# Patient Record
Sex: Male | Born: 1950
Health system: Southern US, Community
[De-identification: ages and names within clinical notes are randomized; demographics above are authoritative.]

## PROBLEM LIST (undated history)

## (undated) DIAGNOSIS — Z8489 Family history of other specified conditions: Secondary | ICD-10-CM

## (undated) DIAGNOSIS — E119 Type 2 diabetes mellitus without complications: Secondary | ICD-10-CM

## (undated) DIAGNOSIS — I739 Peripheral vascular disease, unspecified: Secondary | ICD-10-CM

## (undated) DIAGNOSIS — Z8719 Personal history of other diseases of the digestive system: Secondary | ICD-10-CM

## (undated) DIAGNOSIS — Z9289 Personal history of other medical treatment: Secondary | ICD-10-CM

## (undated) DIAGNOSIS — I252 Old myocardial infarction: Secondary | ICD-10-CM

## (undated) DIAGNOSIS — I10 Essential (primary) hypertension: Secondary | ICD-10-CM

## (undated) DIAGNOSIS — I779 Disorder of arteries and arterioles, unspecified: Secondary | ICD-10-CM

## (undated) DIAGNOSIS — I2510 Atherosclerotic heart disease of native coronary artery without angina pectoris: Secondary | ICD-10-CM

## (undated) HISTORY — PX: NO PAST SURGERIES: SHX2092

## (undated) HISTORY — DX: Atherosclerotic heart disease of native coronary artery without angina pectoris: I25.10

## (undated) HISTORY — DX: Personal history of other medical treatment: Z92.89

## (undated) HISTORY — DX: Disorder of arteries and arterioles, unspecified: I77.9

## (undated) HISTORY — DX: Peripheral vascular disease, unspecified: I73.9

## (undated) HISTORY — DX: Old myocardial infarction: I25.2

---

## 1998-03-03 ENCOUNTER — Encounter: Payer: Self-pay | Admitting: Emergency Medicine

## 1998-03-03 ENCOUNTER — Emergency Department (HOSPITAL_COMMUNITY): Admission: EM | Admit: 1998-03-03 | Discharge: 1998-03-03 | Payer: Self-pay | Admitting: Emergency Medicine

## 2003-03-27 ENCOUNTER — Encounter: Admission: RE | Admit: 2003-03-27 | Discharge: 2003-03-27 | Payer: Self-pay | Admitting: Cardiology

## 2007-11-07 ENCOUNTER — Ambulatory Visit: Payer: Self-pay | Admitting: *Deleted

## 2008-01-09 ENCOUNTER — Ambulatory Visit: Payer: Self-pay | Admitting: Internal Medicine

## 2008-01-09 LAB — CONVERTED CEMR LAB
ALT: 14 units/L (ref 0–53)
AST: 12 units/L (ref 0–37)
Albumin: 4.5 g/dL (ref 3.5–5.2)
Alkaline Phosphatase: 97 units/L (ref 39–117)
BUN: 14 mg/dL (ref 6–23)
Basophils Absolute: 0 10*3/uL (ref 0.0–0.1)
Basophils Relative: 1 % (ref 0–1)
CO2: 24 meq/L (ref 19–32)
Calcium: 9.7 mg/dL (ref 8.4–10.5)
Chloride: 101 meq/L (ref 96–112)
Cholesterol: 208 mg/dL — ABNORMAL HIGH (ref 0–200)
Creatinine, Ser: 0.8 mg/dL (ref 0.40–1.50)
Eosinophils Absolute: 0.2 10*3/uL (ref 0.0–0.7)
Eosinophils Relative: 4 % (ref 0–5)
Glucose, Bld: 328 mg/dL — ABNORMAL HIGH (ref 70–99)
HCT: 42.6 % (ref 39.0–52.0)
HDL: 39 mg/dL — ABNORMAL LOW (ref >39)
Hemoglobin: 14.7 g/dL (ref 13.0–17.0)
LDL Cholesterol: 99 mg/dL (ref 0–99)
Lymphocytes Relative: 36 % (ref 12–46)
Lymphs Abs: 1.5 10*3/uL (ref 0.7–4.0)
MCHC: 34.5 g/dL (ref 30.0–36.0)
MCV: 91.6 fL (ref 78.0–100.0)
Monocytes Absolute: 0.4 10*3/uL (ref 0.1–1.0)
Monocytes Relative: 9 % (ref 3–12)
Neutro Abs: 2 10*3/uL (ref 1.7–7.7)
Neutrophils Relative %: 50 % (ref 43–77)
Platelets: 316 10*3/uL (ref 150–400)
Potassium: 4.2 meq/L (ref 3.5–5.3)
RBC: 4.65 M/uL (ref 4.22–5.81)
RDW: 12.5 % (ref 11.5–15.5)
Sodium: 137 meq/L (ref 135–145)
Total Bilirubin: 0.6 mg/dL (ref 0.3–1.2)
Total CHOL/HDL Ratio: 5.3
Total Protein: 8.6 g/dL — ABNORMAL HIGH (ref 6.0–8.3)
Triglycerides: 351 mg/dL — ABNORMAL HIGH (ref <150)
VLDL: 70 mg/dL — ABNORMAL HIGH (ref 0–40)
WBC: 4 10*3/uL (ref 4.0–10.5)

## 2008-02-07 ENCOUNTER — Encounter (INDEPENDENT_AMBULATORY_CARE_PROVIDER_SITE_OTHER): Payer: Self-pay | Admitting: Adult Health

## 2008-02-07 ENCOUNTER — Ambulatory Visit: Payer: Self-pay | Admitting: Internal Medicine

## 2008-02-07 LAB — CONVERTED CEMR LAB: Microalb, Ur: 0.95 mg/dL (ref 0.00–1.89)

## 2008-02-12 ENCOUNTER — Emergency Department (HOSPITAL_COMMUNITY): Admission: EM | Admit: 2008-02-12 | Discharge: 2008-02-13 | Payer: Self-pay | Admitting: Emergency Medicine

## 2008-02-21 ENCOUNTER — Ambulatory Visit: Payer: Self-pay | Admitting: Family Medicine

## 2008-04-17 ENCOUNTER — Ambulatory Visit: Payer: Self-pay | Admitting: Internal Medicine

## 2008-04-17 LAB — CONVERTED CEMR LAB
Cholesterol: 166 mg/dL (ref 0–200)
HDL: 42 mg/dL (ref >39)
LDL Cholesterol: 92 mg/dL (ref 0–99)
Total CHOL/HDL Ratio: 4
Triglycerides: 158 mg/dL — ABNORMAL HIGH (ref <150)
VLDL: 32 mg/dL (ref 0–40)

## 2008-05-15 ENCOUNTER — Ambulatory Visit: Payer: Self-pay | Admitting: Internal Medicine

## 2008-08-14 ENCOUNTER — Ambulatory Visit: Payer: Self-pay | Admitting: Internal Medicine

## 2008-12-31 ENCOUNTER — Ambulatory Visit: Payer: Self-pay | Admitting: Internal Medicine

## 2009-03-16 ENCOUNTER — Emergency Department (HOSPITAL_COMMUNITY): Admission: EM | Admit: 2009-03-16 | Discharge: 2009-03-16 | Payer: Self-pay | Admitting: Emergency Medicine

## 2009-04-02 ENCOUNTER — Ambulatory Visit: Payer: Self-pay | Admitting: Internal Medicine

## 2009-10-12 ENCOUNTER — Ambulatory Visit: Payer: Self-pay | Admitting: Internal Medicine

## 2009-10-12 LAB — CONVERTED CEMR LAB
BUN: 13 mg/dL (ref 6–23)
CO2: 28 meq/L (ref 19–32)
Calcium: 9.2 mg/dL (ref 8.4–10.5)
Chloride: 103 meq/L (ref 96–112)
Cholesterol: 218 mg/dL — ABNORMAL HIGH (ref 0–200)
Creatinine, Ser: 0.95 mg/dL (ref 0.40–1.50)
Glucose, Bld: 157 mg/dL — ABNORMAL HIGH (ref 70–99)
HDL: 29 mg/dL — ABNORMAL LOW (ref >39)
Hgb A1c MFr Bld: 9.2 % — ABNORMAL HIGH (ref <5.7)
Potassium: 4.8 meq/L (ref 3.5–5.3)
Sodium: 137 meq/L (ref 135–145)
Total CHOL/HDL Ratio: 7.5
Triglycerides: 551 mg/dL — ABNORMAL HIGH (ref <150)

## 2010-01-12 ENCOUNTER — Ambulatory Visit: Payer: Self-pay | Admitting: Internal Medicine

## 2010-05-11 ENCOUNTER — Encounter (INDEPENDENT_AMBULATORY_CARE_PROVIDER_SITE_OTHER): Payer: Self-pay | Admitting: Family Medicine

## 2010-05-11 LAB — CONVERTED CEMR LAB: Microalb, Ur: 0.96 mg/dL (ref 0.00–1.89)

## 2012-07-13 ENCOUNTER — Inpatient Hospital Stay (HOSPITAL_COMMUNITY)
Admission: EM | Admit: 2012-07-13 | Discharge: 2012-07-15 | DRG: 378 | Disposition: A | Discharge status: Home / Self Care | Payer: MEDICAID | Attending: Internal Medicine | Admitting: Internal Medicine

## 2012-07-13 ENCOUNTER — Encounter (HOSPITAL_COMMUNITY)
Admission: EM | Disposition: A | Discharge status: Home / Self Care | Payer: Self-pay | Source: Home / Self Care | Attending: Internal Medicine

## 2012-07-13 ENCOUNTER — Encounter (HOSPITAL_COMMUNITY): Payer: Self-pay | Admitting: Emergency Medicine

## 2012-07-13 DIAGNOSIS — E118 Type 2 diabetes mellitus with unspecified complications: Secondary | ICD-10-CM

## 2012-07-13 DIAGNOSIS — J309 Allergic rhinitis, unspecified: Secondary | ICD-10-CM | POA: Diagnosis present

## 2012-07-13 DIAGNOSIS — E119 Type 2 diabetes mellitus without complications: Secondary | ICD-10-CM | POA: Diagnosis present

## 2012-07-13 DIAGNOSIS — K625 Hemorrhage of anus and rectum: Secondary | ICD-10-CM | POA: Diagnosis present

## 2012-07-13 DIAGNOSIS — K5731 Diverticulosis of large intestine without perforation or abscess with bleeding: Principal | ICD-10-CM | POA: Diagnosis present

## 2012-07-13 DIAGNOSIS — K2970 Gastritis, unspecified, without bleeding: Secondary | ICD-10-CM | POA: Diagnosis present

## 2012-07-13 DIAGNOSIS — K922 Gastrointestinal hemorrhage, unspecified: Secondary | ICD-10-CM

## 2012-07-13 DIAGNOSIS — I10 Essential (primary) hypertension: Secondary | ICD-10-CM | POA: Diagnosis present

## 2012-07-13 DIAGNOSIS — D62 Acute posthemorrhagic anemia: Secondary | ICD-10-CM | POA: Diagnosis present

## 2012-07-13 DIAGNOSIS — Z79899 Other long term (current) drug therapy: Secondary | ICD-10-CM

## 2012-07-13 DIAGNOSIS — E1165 Type 2 diabetes mellitus with hyperglycemia: Secondary | ICD-10-CM

## 2012-07-13 DIAGNOSIS — K2990 Gastroduodenitis, unspecified, without bleeding: Secondary | ICD-10-CM | POA: Diagnosis present

## 2012-07-13 DIAGNOSIS — E785 Hyperlipidemia, unspecified: Secondary | ICD-10-CM

## 2012-07-13 DIAGNOSIS — IMO0002 Reserved for concepts with insufficient information to code with codable children: Secondary | ICD-10-CM | POA: Diagnosis present

## 2012-07-13 HISTORY — DX: Essential (primary) hypertension: I10

## 2012-07-13 HISTORY — DX: Family history of other specified conditions: Z84.89

## 2012-07-13 HISTORY — DX: Type 2 diabetes mellitus without complications: E11.9

## 2012-07-13 HISTORY — PX: ESOPHAGOGASTRODUODENOSCOPY: SHX5428

## 2012-07-13 LAB — URINALYSIS, ROUTINE W REFLEX MICROSCOPIC
Ketones, ur: NEGATIVE mg/dL
Leukocytes, UA: NEGATIVE
Nitrite: NEGATIVE
Urobilinogen, UA: 0.2 mg/dL (ref 0.0–1.0)
pH: 5 (ref 5.0–8.0)

## 2012-07-13 LAB — CBC
HCT: 28.6 % — ABNORMAL LOW (ref 39.0–52.0)
MCH: 31.7 pg (ref 26.0–34.0)
MCHC: 36 g/dL (ref 30.0–36.0)
MCV: 88 fL (ref 78.0–100.0)
RDW: 12.2 % (ref 11.5–15.5)
WBC: 4.3 10*3/uL (ref 4.0–10.5)

## 2012-07-13 LAB — CBC WITH DIFFERENTIAL/PLATELET
Basophils Absolute: 0 10*3/uL (ref 0.0–0.1)
Eosinophils Relative: 5 % (ref 0–5)
Lymphocytes Relative: 29 % (ref 12–46)
MCV: 88.3 fL (ref 78.0–100.0)
Neutro Abs: 2.2 10*3/uL (ref 1.7–7.7)
Platelets: 354 10*3/uL (ref 150–400)
RDW: 12 % (ref 11.5–15.5)
WBC: 3.7 10*3/uL — ABNORMAL LOW (ref 4.0–10.5)

## 2012-07-13 LAB — COMPREHENSIVE METABOLIC PANEL
AST: 11 U/L (ref 0–37)
Albumin: 3.7 g/dL (ref 3.5–5.2)
Calcium: 9.6 mg/dL (ref 8.4–10.5)
Chloride: 99 mEq/L (ref 96–112)
Creatinine, Ser: 0.77 mg/dL (ref 0.50–1.35)
Total Bilirubin: 0.3 mg/dL (ref 0.3–1.2)
Total Protein: 8.4 g/dL — ABNORMAL HIGH (ref 6.0–8.3)

## 2012-07-13 LAB — VITAMIN B12: Vitamin B-12: 458 pg/mL (ref 211–911)

## 2012-07-13 LAB — IRON AND TIBC
Iron: 74 ug/dL (ref 42–135)
Saturation Ratios: 23 % (ref 20–55)
TIBC: 321 ug/dL (ref 215–435)

## 2012-07-13 LAB — APTT: aPTT: 28 seconds (ref 24–37)

## 2012-07-13 LAB — GLUCOSE, CAPILLARY
Glucose-Capillary: 260 mg/dL — ABNORMAL HIGH (ref 70–99)
Glucose-Capillary: 151 mg/dL — ABNORMAL HIGH (ref 70–99)
Glucose-Capillary: 75 mg/dL (ref 70–99)

## 2012-07-13 LAB — TYPE AND SCREEN
ABO/RH(D): O POS
Antibody Screen: NEGATIVE

## 2012-07-13 LAB — PROTIME-INR
INR: 1.05 (ref 0.00–1.49)
Prothrombin Time: 13.6 seconds (ref 11.6–15.2)

## 2012-07-13 LAB — RETICULOCYTES: Retic Ct Pct: 1.5 % (ref 0.4–3.1)

## 2012-07-13 LAB — URINE MICROSCOPIC-ADD ON

## 2012-07-13 SURGERY — EGD (ESOPHAGOGASTRODUODENOSCOPY)
Anesthesia: Moderate Sedation

## 2012-07-13 MED ORDER — BUTAMBEN-TETRACAINE-BENZOCAINE 2-2-14 % EX AERO
INHALATION_SPRAY | CUTANEOUS | Status: DC | PRN
  Administered 2012-07-13: 2 via TOPICAL

## 2012-07-13 MED ORDER — SODIUM CHLORIDE 0.9 % IV SOLN: INTRAVENOUS | Status: DC

## 2012-07-13 MED ORDER — SIMVASTATIN 40 MG PO TABS
40.0000 mg | ORAL_TABLET | Freq: Every day | ORAL | Status: DC
  Administered 2012-07-13: 40 mg via ORAL
  Filled 2012-07-13 (×2): qty 1

## 2012-07-13 MED ORDER — PANTOPRAZOLE SODIUM 40 MG IV SOLR
40.0000 mg | INTRAVENOUS | Status: DC
  Filled 2012-07-13 (×2): qty 40

## 2012-07-13 MED ORDER — FENTANYL CITRATE 0.05 MG/ML IJ SOLN
INTRAMUSCULAR | Status: DC | PRN
  Administered 2012-07-13 (×2): 25 ug via INTRAVENOUS

## 2012-07-13 MED ORDER — PEG 3350-KCL-NA BICARB-NACL 420 G PO SOLR
4000.0000 mL | Freq: Once | ORAL | Status: AC | PRN
  Filled 2012-07-13: qty 4000

## 2012-07-13 MED ORDER — MIDAZOLAM HCL 10 MG/2ML IJ SOLN
INTRAMUSCULAR | Status: DC | PRN
  Administered 2012-07-13 (×2): 2 mg via INTRAVENOUS
  Administered 2012-07-13: 1 mg via INTRAVENOUS

## 2012-07-13 MED ORDER — FENTANYL CITRATE 0.05 MG/ML IJ SOLN
INTRAMUSCULAR | Status: AC
  Filled 2012-07-13: qty 2

## 2012-07-13 MED ORDER — DIPHENHYDRAMINE HCL 50 MG/ML IJ SOLN
25.0000 mg | Freq: Once | INTRAMUSCULAR | Status: AC
  Administered 2012-07-13: 25 mg via INTRAVENOUS

## 2012-07-13 MED ORDER — INSULIN GLARGINE 100 UNIT/ML ~~LOC~~ SOLN
15.0000 [IU] | Freq: Every day | SUBCUTANEOUS | Status: DC
  Administered 2012-07-14: 15 [IU] via SUBCUTANEOUS
  Filled 2012-07-13 (×3): qty 0.15

## 2012-07-13 MED ORDER — MIDAZOLAM HCL 5 MG/ML IJ SOLN
INTRAMUSCULAR | Status: AC
  Filled 2012-07-13: qty 2

## 2012-07-13 MED ORDER — PEG 3350-KCL-NA BICARB-NACL 420 G PO SOLR
4000.0000 mL | Freq: Once | ORAL | Status: AC
  Administered 2012-07-13: 4000 mL via ORAL
  Filled 2012-07-13 (×2): qty 4000

## 2012-07-13 MED ORDER — FAMOTIDINE IN NACL 20-0.9 MG/50ML-% IV SOLN
20.0000 mg | Freq: Once | INTRAVENOUS | Status: AC
  Administered 2012-07-13: 20 mg via INTRAVENOUS
  Filled 2012-07-13: qty 50

## 2012-07-13 MED ORDER — DIPHENHYDRAMINE HCL 50 MG/ML IJ SOLN
INTRAMUSCULAR | Status: AC
  Filled 2012-07-13: qty 1

## 2012-07-13 MED ORDER — FAMOTIDINE IN NACL 20-0.9 MG/50ML-% IV SOLN
20.0000 mg | INTRAVENOUS | Status: AC
  Administered 2012-07-13: 20 mg via INTRAVENOUS
  Filled 2012-07-13: qty 50

## 2012-07-13 MED ORDER — BISACODYL 5 MG PO TBEC
10.0000 mg | DELAYED_RELEASE_TABLET | Freq: Once | ORAL | Status: AC
  Administered 2012-07-13: 10 mg via ORAL
  Filled 2012-07-13: qty 2

## 2012-07-13 MED ORDER — SODIUM CHLORIDE 0.9 % IJ SOLN
3.0000 mL | Freq: Two times a day (BID) | INTRAMUSCULAR | Status: DC
  Administered 2012-07-13: 3 mL via INTRAVENOUS

## 2012-07-13 MED ORDER — SODIUM CHLORIDE 0.9 % IV SOLN
INTRAVENOUS | Status: DC
  Administered 2012-07-13: 20:00:00 via INTRAVENOUS
  Administered 2012-07-14: 1000 mL via INTRAVENOUS

## 2012-07-13 MED ORDER — PANTOPRAZOLE SODIUM 40 MG IV SOLR
40.0000 mg | Freq: Once | INTRAVENOUS | Status: AC
  Administered 2012-07-13: 40 mg via INTRAVENOUS
  Filled 2012-07-13: qty 40

## 2012-07-13 MED ORDER — AMLODIPINE BESYLATE 5 MG PO TABS
5.0000 mg | ORAL_TABLET | Freq: Every day | ORAL | Status: DC
  Administered 2012-07-13 – 2012-07-15 (×2): 5 mg via ORAL
  Filled 2012-07-13 (×3): qty 1

## 2012-07-13 MED ORDER — INSULIN ASPART 100 UNIT/ML ~~LOC~~ SOLN
0.0000 [IU] | Freq: Three times a day (TID) | SUBCUTANEOUS | Status: DC
  Administered 2012-07-13: 8 [IU] via SUBCUTANEOUS
  Administered 2012-07-14: 5 [IU] via SUBCUTANEOUS
  Administered 2012-07-14: 2 [IU] via SUBCUTANEOUS
  Administered 2012-07-15: 11 [IU] via SUBCUTANEOUS

## 2012-07-13 MED ORDER — METHYLPREDNISOLONE SODIUM SUCC 125 MG IJ SOLR
60.0000 mg | Freq: Once | INTRAMUSCULAR | Status: AC
  Administered 2012-07-13: 60 mg via INTRAVENOUS
  Filled 2012-07-13: qty 0.96

## 2012-07-13 NOTE — Op Note (Signed)
[facility] [address] [place], [address]   ENDOSCOPY PROCEDURE REPORT  PATIENT: Carl, Clark  MR#: [id number] BIRTHDATE: 10/08/1950 , 62  yrs. old GENDER: Male ENDOSCOPIST: Willis Modena, MD REFERRED BY:  [facility] PROCEDURE DATE:  07/13/2012 PROCEDURE:  EGD, diagnostic ASA CLASS:     Class II INDICATIONS:  blood in stool, anemia. MEDICATIONS: Fentanyl-Detailed 50 mcg IV and Versed 5 mg IV TOPICAL ANESTHETIC: Cetacaine Spray  DESCRIPTION OF PROCEDURE: After the risks benefits and alternatives of the procedure were thoroughly explained, informed consent was obtained.  The PENTAX GASTOROSCOPE [id number] endoscope was introduced through the mouth and advanced to the second portion of the duodenum. Without limitations.  The instrument was slowly withdrawn as the mucosa was fully examined.    Findings:  Normal esophagus.  Mild gastritis, otherwise normal stomach and pylorus.  Normal duodenum to the second portion.   No ulcer, mass, AVM or other pathology was seen.  No old or fresh blood was seen to the extent of our examination.         The scope was then withdrawn from the patient and the procedure completed.  ENDOSCOPIC IMPRESSION:     Mild gastritis, otherwise normal endoscopy.  No source of blood in stool identified.  RECOMMENDATIONS:     1.  Watch for potential complications of procedure. 2.  Colonoscopy tomorrow. 3.  Next step in management pending colonoscopy findings. 4.  [facility] inpatient team to follow.  eSigned:  Willis Modena, MD 07/13/2012 2:21 PM   CC:

## 2012-07-13 NOTE — Progress Notes (Signed)
Pt c/o lip swelling, bottom lip swollen. Pt states his whole face swelled up about 10days ago. Stated eye were swollen shut, lips very swollen. Dr. Gonzella Lex made aware. New orders given. Emelda Brothers RN

## 2012-07-13 NOTE — Progress Notes (Signed)
Hypoglycemic Event  CBG: 75  Treatment: 15 GM carbohydrate snack  Symptoms: None  Follow-up CBG: Time:2205 CBG Result:151  Possible Reasons for Event: Inadequate meal intake; pt on clear liquid diet & npo after midnight for procedure in am.  Comments/MD notified: Bretta Bang, MD on call notified. Verbal order to hold night time dose of Lantus. Will continue to monitor the pt.     Eliane Decree R  Remember to initiate Hypoglycemia Order Set & complete

## 2012-07-13 NOTE — Interval H&P Note (Signed)
History and Physical Interval Note:  07/13/2012 1:27 PM  Carl Clark  has presented today for surgery, with the diagnosis of melena  The various methods of treatment have been discussed with the patient and family. After consideration of risks, benefits and other options for treatment, the patient has consented to  Procedure(s) with comments: ESOPHAGOGASTRODUODENOSCOPY (EGD) (N/A) - pt in ED A8 as a surgical intervention .  The patient's history has been reviewed, patient examined, no change in status, stable for surgery.  I have reviewed the patient's chart and labs.  Questions were answered to the patient's satisfaction.     Carl Clark  Assessment:  1.  Blood in stool. 2.  Anemia, likely acute blood loss.  Plan:  1.  Endoscopy. 2.  Phone interpreter (Arabic) used to obtain informed consent for procedure.  Risks (bleeding, infection, bowel perforation that could require surgery, sedation-related changes in cardiopulmonary systems), benefits (identification and possible treatment of source of symptoms, exclusion of certain causes of symptoms), and alternatives (watchful waiting, radiographic imaging studies, empiric medical treatment) of upper endoscopy (EGD) were explained to patient (with use of phone interpreter) in detail and patient wishes to proceed. 3.  If endoscopy is unrevealing, consider colonoscopy.

## 2012-07-13 NOTE — ED Notes (Signed)
Pt primary language is arabic.

## 2012-07-13 NOTE — ED Notes (Signed)
Pt seen by PMD yesterday and told to come to ED for further eval of uncontrolled DM A1c=12% and abdominal pain with rectal bleeding x 3-4 days. Pt reports large amount of blood noted when using the bathroom.

## 2012-07-13 NOTE — ED Notes (Signed)
Pt states that he last took his insulin at 5am today.

## 2012-07-13 NOTE — H&P (Signed)
[facility] History and Physical  Carl Clark WUJ:[id number] DOB: Jun 12, 1950 DOA: 07/13/2012  Referring physician: ED PCP: Hildred Laser, MD   Chief Complaint:  Rectal bleeding x 5 days  HPI:  62 Y/O [name] with hx of uncontrolled DM and HTN was sent by his PCP for symptoms of rectal bleeding for past 5 days. Patient  infomrs having large amount of bright red bleeding of almost 5-6  episodes since past 5 days. denies hematemesis, nausea , vomiting, abdominal pain, chest pain, palpitations, SOB, headache, dizziness, blurry vision, urinary symptoms. Denies similar symptoms in past. Denies use of NSAIDs.  In the ED patient's vitals were stable. Noted for hb of 11 and stool for occult blood was positive. [facility] called [facility]. [facility] consulted and patient taken for EGD.  Review of Systems:  Constitutional: Denies fever, chills, diaphoresis, appetite change and fatigue.  HEENT: Denies photophobia, eye pain, redness, hearing loss, ear pain, congestion, sore throat, rhinorrhea, sneezing, mouth sores, trouble swallowing, neck pain, neck stiffness and tinnitus.   Respiratory: Denies SOB, DOE, cough, chest tightness,  and wheezing.   Cardiovascular: Denies chest pain, palpitations and leg swelling.  Gastrointestinal: painless rectal bleed. Denies nausea, vomiting, abdominal pain, diarrhea, constipation, and abdominal distention.  Genitourinary: Denies dysuria, urgency, frequency, hematuria, flank pain and difficulty urinating.  Musculoskeletal: Denies myalgias, back pain, joint swelling, arthralgias and gait problem.  Skin: Denies pallor, rash and wound.  Neurological: Denies dizziness, seizures, syncope, weakness, light-headedness, numbness and headaches.  Hematological: Denies adenopathy. Easy bruising, personal or family bleeding history  Psychiatric/Behavioral: Denies suicidal ideation, mood changes, confusion, nervousness, sleep disturbance and  agitation   Past Medical History  Diagnosis Date  . Diabetes mellitus without complication   . Hypertension    History reviewed. No pertinent past surgical history. Social History:  reports that he has never smoked. He does not have any smokeless tobacco history on file. He reports that he does not drink alcohol or use illicit drugs.  No Known Allergies  History reviewed. No pertinent family history.  Prior to Admission medications   Medication Sig Start Date End Date Taking? Authorizing Provider  amLODipine (NORVASC) 5 MG tablet Take 5 mg by mouth daily.   Yes Historical Provider, MD  cetirizine (ZYRTEC) 10 MG tablet Take 10 mg by mouth daily as needed for allergies.   Yes Historical Provider, MD  glipiZIDE (GLUCOTROL) 10 MG tablet Take 10 mg by mouth 2 (two) times daily before a meal.   Yes Historical Provider, MD  metFORMIN (GLUCOPHAGE) 1000 MG tablet Take 1,000 mg by mouth 2 (two) times daily with a meal.   Yes Historical Provider, MD  pravastatin (PRAVACHOL) 80 MG tablet Take 80 mg by mouth daily.   Yes Historical Provider, MD    Physical Exam:  Filed Vitals:   07/13/12 1422 07/13/12 1430 07/13/12 1440 07/13/12 1504  BP: 129/71 129/71 131/85 130/61  Pulse: 81   65  Temp: 98.1 F (36.7 C)   97.7 F (36.5 C)  TempSrc: Oral   Oral  Resp: 14 15 13 18   Height:      Weight:      SpO2: 100%   100%    Constitutional: Vital signs reviewed.  Patient is a well-developed and well-nourished in no acute distress and cooperative with exam. Alert and oriented x3.  Head: Normocephalic and atraumatic Mouth: no erythema or exudates, MMM Eyes: PERRL, EOMI, conjunctivae normal, No scleral icterus.  Neck: Supple, Trachea midline normal ROM, No JVD, mass,  thyromegaly, or carotid bruit present.  Cardiovascular: RRR, S1 normal, S2 normal, no MRG, pulses symmetric and intact bilaterally Pulmonary/Chest: CTAB, no wheezes, rales, or rhonchi Abdominal: Soft. Non-tender, non-distended, bowel  sounds are normal, no masses, organomegaly, or guarding present.  GU: no CVA tenderness Musculoskeletal: No joint deformities, erythema, or stiffness, ROM full and no nontender Ext: no edema and no cyanosis, pulses palpable bilaterally Hematology: no cervical, inginal, or axillary adenopathy.  Neurological: A&O x3, normal motor and sensory function Skin: Warm, dry and intact. No rash, cyanosis, or clubbing.  Psychiatric: Normal mood and affect. speech and behavior is normal. Judgment and thought content normal. Cognition and memory are normal.   Labs on Admission:  Basic Metabolic Panel:  Recent Labs Lab 07/13/12 0937  NA 136  K 3.9  CL 99  CO2 28  GLUCOSE 318*  BUN 22  CREATININE 0.77  CALCIUM 9.6   Liver Function Tests:  Recent Labs Lab 07/13/12 0937  AST 11  ALT 8  ALKPHOS 72  BILITOT 0.3  PROT 8.4*  ALBUMIN 3.7   No results found for this basename: LIPASE, AMYLASE,  in the last 168 hours No results found for this basename: AMMONIA,  in the last 168 hours CBC:  Recent Labs Lab 07/13/12 0937  WBC 3.7*  NEUTROABS 2.2  HGB 11.4*  HCT 31.7*  MCV 88.3  PLT 354   Cardiac Enzymes: No results found for this basename: CKTOTAL, CKMB, CKMBINDEX, TROPONINI,  in the last 168 hours BNP: No components found with this basename: POCBNP,  CBG:  Recent Labs Lab 07/13/12 0943  GLUCAP 288*    Radiological Exams on Admission: No results found.    Assessment/Plan  Principal Problem:   Painless Rectal bleeding No clear etiology. [facility] consulted. EGD showing mild gastritis only. patient denies use of NSAIDs or previous EGD/ colonoscopy Admit to telemetry. Clears for now and NPO after midnight for colonoscopy in am. protonix q 24hr Serial H&H q 8hr. No need for transfusion at this time. No baseline hb noted.    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 On lantus 20 units on am, glipizide and metformin. Hold oral meds. lantus 15 units at bedtime and SSI.    Unspecified essential hypertension continue amlod   DVT prophylaxis: SCD  Code Status: full code Family Communication: none at bedside Disposition Plan: home once stable  Eddie [facility] Pager [phone]  If 7PM-7AM, please contact night-coverage [email or web address] Password [facility] 07/13/2012, 3:39 PM     Total time spent: 70 minutes

## 2012-07-13 NOTE — H&P (View-Only) (Signed)
[facility] Consultation Note  Referring Provider:  Dr. Eddie [facility]) Primary Care Physician:  Hildred Laser, MD  Reason for Consultation:  Blood in stool  HPI: Carl Clark is a 62 y.o. [name] presenting with blood in stool.  History somewhat limited due to language barrier.  He describes, over the past several days, black stool which, upon flushing, appears more maroon red in the toilet.  No abdominal pain, but describes chronic dyschezia when straining.  Denies prior endoscopy. Uses aspirin on occasion, no other NSAIDs or blood thinners.   Past Medical History  Diagnosis Date  . Diabetes mellitus without complication   . Hypertension     History reviewed. No pertinent past surgical history.  Prior to Admission medications   Medication Sig Start Date End Date Taking? Authorizing Provider  amLODipine (NORVASC) 5 MG tablet Take 5 mg by mouth daily.   Yes Historical Provider, MD  cetirizine (ZYRTEC) 10 MG tablet Take 10 mg by mouth daily as needed for allergies.   Yes Historical Provider, MD  glipiZIDE (GLUCOTROL) 10 MG tablet Take 10 mg by mouth 2 (two) times daily before a meal.   Yes Historical Provider, MD  metFORMIN (GLUCOPHAGE) 1000 MG tablet Take 1,000 mg by mouth 2 (two) times daily with a meal.   Yes Historical Provider, MD  pravastatin (PRAVACHOL) 80 MG tablet Take 80 mg by mouth daily.   Yes Historical Provider, MD    No current facility-administered medications for this encounter.   Current Outpatient Prescriptions  Medication Sig Dispense Refill  . amLODipine (NORVASC) 5 MG tablet Take 5 mg by mouth daily.      . cetirizine (ZYRTEC) 10 MG tablet Take 10 mg by mouth daily as needed for allergies.      Marland Kitchen glipiZIDE (GLUCOTROL) 10 MG tablet Take 10 mg by mouth 2 (two) times daily before a meal.      . metFORMIN (GLUCOPHAGE) 1000 MG tablet Take 1,000 mg by mouth 2 (two) times daily with a meal.      . pravastatin (PRAVACHOL) 80 MG  tablet Take 80 mg by mouth daily.        Allergies as of 07/13/2012  . (No Known Allergies)    No family history on file.  History   Social History  . Marital Status: Widowed    Spouse Name: N/A    Number of Children: N/A  . Years of Education: N/A   Occupational History  . Not on file.   Social History Main Topics  . Smoking status: Never Smoker   . Smokeless tobacco: Not on file  . Alcohol Use: No  . Drug Use: No  . Sexually Active: Not on file   Other Topics Concern  . Not on file   Social History Narrative  . No narrative on file    Review of Systems: Positive = bold (a bit limited due to language barrier) Gen: Denies any fever, chills, rigors, night sweats, anorexia, fatigue, weakness, malaise, involuntary weight loss, and sleep disorder CV: Denies chest pain, angina, palpitations, syncope, orthopnea, PND, peripheral edema, and claudication. Resp: Denies dyspnea, cough, sputum, wheezing, coughing up blood. GI: Described in detail in HPI.    GU : Denies urinary burning, blood in urine, urinary frequency, urinary hesitancy, nocturnal urination, and urinary incontinence. MS: Denies joint pain or swelling.  Denies muscle weakness, cramps, atrophy.  Derm: Denies rash, itching, oral ulcerations, hives, unhealing ulcers.  Psych: Denies depression, anxiety, memory loss, suicidal ideation, hallucinations,  and  confusion. Heme: Denies bruising, bleeding, and enlarged lymph nodes. Neuro:  Denies any headaches, dizziness, paresthesias. Endo:  Denies any problems with DM, thyroid, adrenal function.  Physical Exam: Vital signs in last 24 hours: Temp:  [97.4 F (36.3 C)] 97.4 F (36.3 C) (04/25 0934) Pulse Rate:  [67-88] 67 (04/25 1245) Resp:  [11-18] 11 (04/25 1245) BP: (128-165)/(69-73) 128/69 mmHg (04/25 1245) SpO2:  [100 %] 100 % (04/25 1245)   General:   Alert,  Well-developed, well-nourished, pleasant and cooperative in NAD Head:  Normocephalic and  atraumatic. Eyes:  Sclera clear, no icterus.   Conjunctiva pink. Ears:  Normal auditory acuity. Nose:  No deformity, discharge,  or lesions. Mouth:  No deformity or lesions.  Oropharynx pink & moist. Neck:  Supple; no masses or thyromegaly. Lungs:  Clear throughout to auscultation.   No wheezes, crackles, or rhonchi. No acute distress. Heart:  Regular rate and rhythm; no murmurs, clicks, rubs,  or gallops. Abdomen:  Soft, nontender and nondistended. No masses, hepatosplenomegaly or hernias noted. Normal bowel sounds, without guarding, and without rebound.     Msk:  Symmetrical without gross deformities. Normal posture. Pulses:  Normal pulses noted. Extremities:  Without clubbing or edema. Neurologic:  Alert and  oriented x4;  grossly normal neurologically. Skin:  Intact without significant lesions or rashes. Cervical Nodes:  No significant cervical adenopathy. Psych:  Alert and cooperative. Normal mood and affect.  Lab Results:  Recent Labs  07/13/12 0937  WBC 3.7*  HGB 11.4*  HCT 31.7*  PLT 354   BMET  Recent Labs  07/13/12 0937  NA 136  K 3.9  CL 99  CO2 28  GLUCOSE 318*  BUN 22  CREATININE 0.77  CALCIUM 9.6   LFT  Recent Labs  07/13/12 0937  PROT 8.4*  ALBUMIN 3.7  AST 11  ALT 8  ALKPHOS 72  BILITOT 0.3   PT/INR  Recent Labs  07/13/12 1105  LABPROT 13.6  INR 1.05    Studies/Results: No results found.  Impression:  1.  Blood in stool, mixed black and red blood. 2.  Mild anemia, likely acute blood loss.  Hemodynamically stable.  Plan:  1.  Protonix. 2.  Endoscopy today.  Will need to use phone interpreter (Arabic; no family or friend available to interpret) to obtain consent for procedure. 3.  If endoscopy is unrevealing, will need to consider colonoscopy.   LOS: 0 days   Christen Bedoya M  07/13/2012, 1:01 PM

## 2012-07-13 NOTE — ED Provider Notes (Signed)
History     CSN: [id number]  Arrival date & time 07/13/12  7829   First MD Initiated Contact with Patient 07/13/12 1012      Chief Complaint  Patient presents with  . Rectal Bleeding    (Consider location/radiation/quality/duration/timing/severity/associated sxs/prior treatment) HPI Comments: 62 year old male with a history of diabetes and hypertension controlled on oral medications and insulin who presents with a complaint of rectal bleeding. He states that over the last 4 days he has noticed a intermittent rectal bleeding, sometimes is bright red but more often than not as a dark red oral black-colored stool. He has been having occasional lightheadedness but no changes in difficulty breathing, no visual changes, no chest pain or shortness of breath and no abdominal pain. He denies any history of abdominal problems, has never had abdominal surgery, and to his knowledge has never had a peptic ulcer. He does take intermittent aspirin but does not use any other anti-inflammatories. He was recommended by his brother who lives in [place] and is a physician to come to the hospital for evaluation and admission.  Patient is a 62 y.o. male presenting with hematochezia. The history is provided by the patient.  Rectal Bleeding     Past Medical History  Diagnosis Date  . Diabetes mellitus without complication   . Hypertension     History reviewed. No pertinent past surgical history.  No family history on file.  History  Substance Use Topics  . Smoking status: Never Smoker   . Smokeless tobacco: Not on file  . Alcohol Use: No      Review of Systems  Gastrointestinal: Positive for hematochezia.  All other systems reviewed and are negative.    Allergies  Review of patient's allergies indicates no known allergies.  Home Medications   Current Outpatient Rx  Name  Route  Sig  Dispense  Refill  . amLODipine (NORVASC) 5 MG tablet   Oral   Take 5 mg by mouth daily.         . cetirizine (ZYRTEC) 10 MG tablet   Oral   Take 10 mg by mouth daily as needed for allergies.         Marland Kitchen glipiZIDE (GLUCOTROL) 10 MG tablet   Oral   Take 10 mg by mouth 2 (two) times daily before a meal.         . metFORMIN (GLUCOPHAGE) 1000 MG tablet   Oral   Take 1,000 mg by mouth 2 (two) times daily with a meal.         . pravastatin (PRAVACHOL) 80 MG tablet   Oral   Take 80 mg by mouth daily.           BP 165/73  Pulse 88  Temp(Src) 97.4 F (36.3 C)  Resp 18  SpO2 100%  Physical Exam  Nursing note and vitals reviewed. Constitutional: He appears well-developed and well-nourished. No distress.  HENT:  Head: Normocephalic and atraumatic.  Mouth/Throat: Oropharynx is clear and moist. No oropharyngeal exudate.  Eyes: Conjunctivae and EOM are normal. Pupils are equal, round, and reactive to light. Right eye exhibits no discharge. Left eye exhibits no discharge. No scleral icterus.  Neck: Normal range of motion. Neck supple. No JVD present. No thyromegaly present.  Cardiovascular: Normal rate, regular rhythm, normal heart sounds and intact distal pulses.  Exam reveals no gallop and no friction rub.   No murmur heard. Pulmonary/Chest: Effort normal and breath sounds normal. No respiratory distress. He has no wheezes. He  has no rales.  Abdominal: Soft. Bowel sounds are normal. He exhibits no distension and no mass. There is no tenderness.  Genitourinary:  Rectal exam shows gross melena, no masses, no hemorrhoids palpated, no fissures.  Musculoskeletal: Normal range of motion. He exhibits no edema and no tenderness.  Lymphadenopathy:    He has no cervical adenopathy.  Neurological: He is alert. Coordination normal.  Skin: Skin is warm and dry. No rash noted. No erythema.  Psychiatric: He has a normal mood and affect. His behavior is normal.    ED Course  Procedures (including critical care time)  Labs Reviewed  CBC WITH DIFFERENTIAL - Abnormal; Notable for  the following:    WBC 3.7 (*)    RBC 3.59 (*)    Hemoglobin 11.4 (*)    HCT 31.7 (*)    All other components within normal limits  COMPREHENSIVE METABOLIC PANEL - Abnormal; Notable for the following:    Glucose, Bld 318 (*)    Total Protein 8.4 (*)    All other components within normal limits  GLUCOSE, CAPILLARY - Abnormal; Notable for the following:    Glucose-Capillary 288 (*)    All other components within normal limits  RETICULOCYTES - Abnormal; Notable for the following:    RBC. 3.38 (*)    All other components within normal limits  APTT  PROTIME-INR  URINALYSIS, ROUTINE W REFLEX MICROSCOPIC  VITAMIN B12  FOLATE  IRON AND TIBC  FERRITIN  TYPE AND SCREEN  ABO/RH   No results found.   1. Upper GI bleed       MDM  Vital signs are unremarkable other than mild hypertension, the patient does have gross melanotic stool on exam consistent with an upper GI bleed. He has no abdominal tenderness and I doubt that there is a perforated ulcer. I will pursue a workup for gastrointestinal bleeding including laboratory workup, anemia panel and start medication such as proton pump inhibitors and Pepcid. The patient will need admission to the hospital. His hemoglobin is 11.4, glucose 280 at this time.  D/w hospitalist Dr. Gonzella Lex and Dr. Cleda Clarks with GI who will both see pt.  Admit to tel      Vida Roller, MD 07/13/12 1233

## 2012-07-13 NOTE — Consult Note (Signed)
[facility] Consultation Note  Referring Provider:  Dr. Nishant Dhungel ([facility]) Primary Care Physician:  MAHMOUD,HATIM, MD  Reason for Consultation:  Blood in stool  HPI: Carl Clark is a 62 y.o. [place] male presenting with blood in stool.  History somewhat limited due to language barrier.  He describes, over the past several days, black stool which, upon flushing, appears more maroon red in the toilet.  No abdominal pain, but describes chronic dyschezia when straining.  Denies prior endoscopy. Uses aspirin on occasion, no other NSAIDs or blood thinners.   Past Medical History  Diagnosis Date  . Diabetes mellitus without complication   . Hypertension     History reviewed. No pertinent past surgical history.  Prior to Admission medications   Medication Sig Start Date End Date Taking? Authorizing Provider  amLODipine (NORVASC) 5 MG tablet Take 5 mg by mouth daily.   Yes Historical Provider, MD  cetirizine (ZYRTEC) 10 MG tablet Take 10 mg by mouth daily as needed for allergies.   Yes Historical Provider, MD  glipiZIDE (GLUCOTROL) 10 MG tablet Take 10 mg by mouth 2 (two) times daily before a meal.   Yes Historical Provider, MD  metFORMIN (GLUCOPHAGE) 1000 MG tablet Take 1,000 mg by mouth 2 (two) times daily with a meal.   Yes Historical Provider, MD  pravastatin (PRAVACHOL) 80 MG tablet Take 80 mg by mouth daily.   Yes Historical Provider, MD    No current facility-administered medications for this encounter.   Current Outpatient Prescriptions  Medication Sig Dispense Refill  . amLODipine (NORVASC) 5 MG tablet Take 5 mg by mouth daily.      . cetirizine (ZYRTEC) 10 MG tablet Take 10 mg by mouth daily as needed for allergies.      . glipiZIDE (GLUCOTROL) 10 MG tablet Take 10 mg by mouth 2 (two) times daily before a meal.      . metFORMIN (GLUCOPHAGE) 1000 MG tablet Take 1,000 mg by mouth 2 (two) times daily with a meal.      . pravastatin (PRAVACHOL) 80 MG  tablet Take 80 mg by mouth daily.        Allergies as of 07/13/2012  . (No Known Allergies)    No family history on file.  History   Social History  . Marital Status: Widowed    Spouse Name: N/A    Number of Children: N/A  . Years of Education: N/A   Occupational History  . Not on file.   Social History Main Topics  . Smoking status: Never Smoker   . Smokeless tobacco: Not on file  . Alcohol Use: No  . Drug Use: No  . Sexually Active: Not on file   Other Topics Concern  . Not on file   Social History Narrative  . No narrative on file    Review of Systems: Positive = bold (a bit limited due to language barrier) Gen: Denies any fever, chills, rigors, night sweats, anorexia, fatigue, weakness, malaise, involuntary weight loss, and sleep disorder CV: Denies chest pain, angina, palpitations, syncope, orthopnea, PND, peripheral edema, and claudication. Resp: Denies dyspnea, cough, sputum, wheezing, coughing up blood. GI: Described in detail in HPI.    GU : Denies urinary burning, blood in urine, urinary frequency, urinary hesitancy, nocturnal urination, and urinary incontinence. MS: Denies joint pain or swelling.  Denies muscle weakness, cramps, atrophy.  Derm: Denies rash, itching, oral ulcerations, hives, unhealing ulcers.  Psych: Denies depression, anxiety, memory loss, suicidal ideation, hallucinations,  and   confusion. Heme: Denies bruising, bleeding, and enlarged lymph nodes. Neuro:  Denies any headaches, dizziness, paresthesias. Endo:  Denies any problems with DM, thyroid, adrenal function.  Physical Exam: Vital signs in last 24 hours: Temp:  [97.4 F (36.3 C)] 97.4 F (36.3 C) (04/25 0934) Pulse Rate:  [67-88] 67 (04/25 1245) Resp:  [11-18] 11 (04/25 1245) BP: (128-165)/(69-73) 128/69 mmHg (04/25 1245) SpO2:  [100 %] 100 % (04/25 1245)   General:   Alert,  Well-developed, well-nourished, pleasant and cooperative in NAD Head:  Normocephalic and  atraumatic. Eyes:  Sclera clear, no icterus.   Conjunctiva pink. Ears:  Normal auditory acuity. Nose:  No deformity, discharge,  or lesions. Mouth:  No deformity or lesions.  Oropharynx pink & moist. Neck:  Supple; no masses or thyromegaly. Lungs:  Clear throughout to auscultation.   No wheezes, crackles, or rhonchi. No acute distress. Heart:  Regular rate and rhythm; no murmurs, clicks, rubs,  or gallops. Abdomen:  Soft, nontender and nondistended. No masses, hepatosplenomegaly or hernias noted. Normal bowel sounds, without guarding, and without rebound.     Msk:  Symmetrical without gross deformities. Normal posture. Pulses:  Normal pulses noted. Extremities:  Without clubbing or edema. Neurologic:  Alert and  oriented x4;  grossly normal neurologically. Skin:  Intact without significant lesions or rashes. Cervical Nodes:  No significant cervical adenopathy. Psych:  Alert and cooperative. Normal mood and affect.  Lab Results:  Recent Labs  07/13/12 0937  WBC 3.7*  HGB 11.4*  HCT 31.7*  PLT 354   BMET  Recent Labs  07/13/12 0937  NA 136  K 3.9  CL 99  CO2 28  GLUCOSE 318*  BUN 22  CREATININE 0.77  CALCIUM 9.6   LFT  Recent Labs  07/13/12 0937  PROT 8.4*  ALBUMIN 3.7  AST 11  ALT 8  ALKPHOS 72  BILITOT 0.3   PT/INR  Recent Labs  07/13/12 1105  LABPROT 13.6  INR 1.05    Studies/Results: No results found.  Impression:  1.  Blood in stool, mixed black and red blood. 2.  Mild anemia, likely acute blood loss.  Hemodynamically stable.  Plan:  1.  Protonix. 2.  Endoscopy today.  Will need to use phone interpreter (Arabic; no family or friend available to interpret) to obtain consent for procedure. 3.  If endoscopy is unrevealing, will need to consider colonoscopy.   LOS: 0 days   Angeni Chaudhuri M  07/13/2012, 1:01 PM   

## 2012-07-14 ENCOUNTER — Encounter (HOSPITAL_COMMUNITY): Payer: Self-pay | Admitting: General Practice

## 2012-07-14 ENCOUNTER — Encounter (HOSPITAL_COMMUNITY)
Admission: EM | Disposition: A | Discharge status: Home / Self Care | Payer: Self-pay | Source: Home / Self Care | Attending: Internal Medicine

## 2012-07-14 DIAGNOSIS — E785 Hyperlipidemia, unspecified: Secondary | ICD-10-CM

## 2012-07-14 HISTORY — PX: COLONOSCOPY: SHX5424

## 2012-07-14 LAB — GLUCOSE, CAPILLARY
Glucose-Capillary: 145 mg/dL — ABNORMAL HIGH (ref 70–99)
Glucose-Capillary: 133 mg/dL — ABNORMAL HIGH (ref 70–99)

## 2012-07-14 LAB — CBC
HCT: 26.6 % — ABNORMAL LOW (ref 39.0–52.0)
Hemoglobin: 10.4 g/dL — ABNORMAL LOW (ref 13.0–17.0)
Hemoglobin: 9.5 g/dL — ABNORMAL LOW (ref 13.0–17.0)
MCH: 31.2 pg (ref 26.0–34.0)
MCHC: 35.5 g/dL (ref 30.0–36.0)
MCHC: 35.7 g/dL (ref 30.0–36.0)
RBC: 3.04 MIL/uL — ABNORMAL LOW (ref 4.22–5.81)
RDW: 12.2 % (ref 11.5–15.5)
WBC: 5.6 10*3/uL (ref 4.0–10.5)

## 2012-07-14 SURGERY — COLONOSCOPY
Anesthesia: Moderate Sedation | Laterality: Left

## 2012-07-14 MED ORDER — MIDAZOLAM HCL 5 MG/ML IJ SOLN
INTRAMUSCULAR | Status: AC
  Filled 2012-07-14: qty 3

## 2012-07-14 MED ORDER — FERROUS FUMARATE 325 (106 FE) MG PO TABS
1.0000 | ORAL_TABLET | Freq: Every day | ORAL | Status: DC
  Administered 2012-07-14 – 2012-07-15 (×2): 106 mg via ORAL
  Filled 2012-07-14 (×2): qty 1

## 2012-07-14 MED ORDER — FENTANYL CITRATE 0.05 MG/ML IJ SOLN
INTRAMUSCULAR | Status: DC | PRN
  Administered 2012-07-14 (×4): 25 ug via INTRAVENOUS

## 2012-07-14 MED ORDER — PNEUMOCOCCAL VAC POLYVALENT 25 MCG/0.5ML IJ INJ
0.5000 mL | INJECTION | Freq: Once | INTRAMUSCULAR | Status: DC
  Filled 2012-07-14: qty 0.5

## 2012-07-14 MED ORDER — FENTANYL CITRATE 0.05 MG/ML IJ SOLN
INTRAMUSCULAR | Status: AC
  Filled 2012-07-14: qty 4

## 2012-07-14 MED ORDER — MIDAZOLAM HCL 5 MG/5ML IJ SOLN
INTRAMUSCULAR | Status: DC | PRN
  Administered 2012-07-14 (×3): 2 mg via INTRAVENOUS
  Administered 2012-07-14: 1 mg via INTRAVENOUS

## 2012-07-14 MED ORDER — PANTOPRAZOLE SODIUM 40 MG PO TBEC
40.0000 mg | DELAYED_RELEASE_TABLET | Freq: Every day | ORAL | Status: DC
  Administered 2012-07-15: 40 mg via ORAL
  Filled 2012-07-14: qty 1

## 2012-07-14 MED ORDER — ATORVASTATIN CALCIUM 20 MG PO TABS
20.0000 mg | ORAL_TABLET | Freq: Every day | ORAL | Status: DC
  Administered 2012-07-14: 20 mg via ORAL
  Filled 2012-07-14 (×2): qty 1

## 2012-07-14 NOTE — Op Note (Signed)
[facility] [address] [place], [address]   COLONOSCOPY PROCEDURE REPORT  PATIENT: Carl Clark, Carl Clark  MR#: [id number] BIRTHDATE: [birth year] , 62  yrs. old GENDER: Male ENDOSCOPIST: Willis Modena, MD REFERRED BM:[facility] Hospitalist PROCEDURE DATE:  07/14/2012 PROCEDURE:   Colonoscopy with control of bleeding ASA CLASS:   Class II INDICATIONS:blood in stool, negative endoscopy. MEDICATIONS: Fentanyl 100 mcg IV and Versed 8 mg IV DESCRIPTION OF PROCEDURE:   After the risks benefits and alternatives of the procedure were thoroughly explained, informed consent was obtained.  A digital rectal exam revealed no abnormalities of the rectum.   The Pentax Ped Colon [id number] endoscope was introduced through the anus and advanced to the cecum, which was identified by both the appendix and ileocecal valve. No adverse events experienced.   The quality of the prep was good.  The instrument was then slowly withdrawn as the colon was fully examined.   Findings:  Digital rectal exam normal.  Prep quality good.  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 At the hepatic flexure, a diverticulum with clot and slow oozing was seen.  Two clips were placed over the diverticulum with good effect; no residual bleeding seen after placement.  Couple tiny hyperplastic-appearing polyps in the distal rectum.  No other polyps, masses, vascular ectasias, or inflammatory changes were seen.  Retroflexed view of rectum normal.      Withdrawal time was about 10 minutes.  The scope was withdrawn and the procedure completed.  ENDOSCOPIC IMPRESSION:     Suspect bleeding from diverticulum at hepatic flexure, clips placed, no further bleeding noted.   RECOMMENDATIONS:     1.  Watch for potential complications of procedure. 2.  Full liquid diet, advance slowly as tolerated. 3.  If tolerates diet and no further bleeding today, consider discharge tomorrow.   If has further bleeding would consider angiography with attention to hepatic flexure (site marked also with clips); doubt utility of tagged RBC study, as I feel I have localized bleeding site. 4.  Repeat colonoscopy 5 years. 5.  [facility] inpatient team to follow.  eSigned:  Willis Modena, MD 07/14/2012 11:09 AM   cc:

## 2012-07-14 NOTE — Progress Notes (Signed)
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 Status post clipping process by GI. -will continue PPI for mild gastritis as seen on EGD (done on 4/25) -advance diet  -start patient on hemocyte daily -follow Hgb -if stable will d/c home in am  2-DM: continue lantus and SSI  3-HTN: continue amlodipine  4-HLD: continue statins  DVT: SCd's  Code Status: full Family Communication: cousin by phone and significant other at bedside. Disposition Plan: home when medically stable   Consultants:  GI  Procedures:  EGD (mild gastritis)  Colonoscopy (with right hepatic flexure diverticulum oozing blood; S/P clipping by GI)  Antibiotics:  none  HPI/Subjective: Afebrile, denies nausea, vomiting or abdominal pain. Last episode of bleeding was around 2:30am  Objective: Filed Vitals:   07/14/12 1110 07/14/12 1120 07/14/12 1130 07/14/12 1300  BP: 121/59 121/59 108/76 129/57  Pulse:    77  Temp: 97.4 F (36.3 C)   97.9 F (36.6 C)  TempSrc: Oral   Oral  Resp: 13 10 13 18   Height:      Weight:      SpO2: 100% 98% 100% 100%   No intake or output data in the 24 hours ending 07/14/12 1532 Filed Weights   07/13/12 1337  Weight: 62.143 kg (137 lb)    Exam:   General:  NAD, afebrile, denies abd pain  Cardiovascular: S1 and S2, no rubs or gallops  Respiratory: CTA bilaterally  Abdomen: soft, NT, ND, positive BS  Musculoskeletal: no edema, no cyanosis, no clubbing  Data Reviewed: Basic Metabolic Panel:  Recent Labs Lab 07/13/12 0937  NA 136  K 3.9  CL 99  CO2 28  GLUCOSE 318*  BUN 22  CREATININE 0.77  CALCIUM 9.6   Liver Function Tests:  Recent Labs Lab 07/13/12 0937  AST 11  ALT 8  ALKPHOS 72  BILITOT 0.3  PROT 8.4*  ALBUMIN 3.7   CBC:  Recent Labs Lab 07/13/12 0937  07/13/12 1641 07/14/12 0027 07/14/12 0900  WBC 3.7* 4.3 6.8 5.6  NEUTROABS 2.2  --   --   --   HGB 11.4* 10.3* 10.4* 9.5*  HCT 31.7* 28.6* 29.3* 26.6*  MCV 88.3 88.0 88.0 87.5  PLT 354 292 335 297   CBG:  Recent Labs Lab 07/13/12 1637 07/13/12 2131 07/13/12 2204 07/14/12 0736 07/14/12 1218  GLUCAP 260* 75 151* 145* 133*    Studies: No results found.  Scheduled Meds: . amLODipine  5 mg Oral Daily  . atorvastatin  20 mg Oral q1800  . ferrous fumarate  1 tablet Oral Daily  . insulin aspart  0-15 Units Subcutaneous TID WC  . insulin glargine  15 Units Subcutaneous QHS  . [START ON 07/15/2012] pantoprazole  40 mg Oral Q1200  . pneumococcal 23 valent vaccine  0.5 mL Intramuscular Once  . sodium chloride  3 mL Intravenous Q12H   Continuous Infusions: . sodium chloride 75 mL/hr at 07/13/12 1941    Principal Problem:   Rectal bleeding Active Problems:   Type II or unspecified type diabetes mellitus with unspecified complication, uncontrolled   Unspecified essential hypertension    Time spent: >30 minutes    Carl Clark  [facility] Pager [phone]. If 7PM-7AM, please contact night-coverage at [email or web address], password [facility] 07/14/2012, 3:32 PM  LOS: 1 day

## 2012-07-14 NOTE — Progress Notes (Signed)
INITIAL NUTRITION ASSESSMENT  DOCUMENTATION CODES Per approved criteria  -Not Applicable   INTERVENTION:  Advance diet as medically appropriate RD to follow for nutrition care plan, add interventions accordingly  NUTRITION DIAGNOSIS: Inadequate oral intake related to inability to eat as evidenced by NPO status  Goal: Oral intake with meals to meet >/= 90% of estimated nutrition needs  Monitor:  PO intake, desire for supplements, weight, labs, I/O's  Reason for Assessment: Malnutrition Screening Tool Report  62 y.o. male  Admitting Dx: Rectal bleeding  ASSESSMENT: Patient with hx of uncontrolled DM and HTN was sent by his PCP for symptoms of rectal bleeding for past 5 days.   Patient currently in Endoscopy; RD unable to obtain nutrition hx; per Nutrition Screen patient has lost approximately 7 lbs (time frame unknown at this time) and has been eating poorly because of a decreased appetite; currently NPO for colonoscopy ---> would benefit from addition of supplements.  Height: Ht Readings from Last 1 Encounters:  07/13/12 5\' 5"  (1.651 m)    Weight: Wt Readings from Last 1 Encounters:  07/13/12 137 lb (62.143 kg)    Ideal Body Weight: 136 lb  % Ideal Body Weight: 101%  Wt Readings from Last 10 Encounters:  07/13/12 137 lb (62.143 kg)  07/13/12 137 lb (62.143 kg)  07/13/12 137 lb (62.143 kg)    Usual Body Weight: unable to obtain  % Usual Body Weight: ---  BMI:  Body mass index is 22.8 kg/(m^2).  Estimated Nutritional Needs: Kcal: 1700-1900 Protein: 80-90 gm Fluid: 1.7-1.9 L  Skin: Intact  Diet Order: NPO  EDUCATION NEEDS: -No education needs identified at this time  Last BM: 4/26  Labs:   Recent Labs Lab 07/13/12 0937  NA 136  K 3.9  CL 99  CO2 28  BUN 22  CREATININE 0.77  CALCIUM 9.6  GLUCOSE 318*    CBG (last 3)   Recent Labs  07/13/12 2131 07/13/12 2204 07/14/12 0736  GLUCAP 75 151* 145*    Scheduled Meds: . [MAR HOLD]  amLODipine  5 mg Oral Daily  . [MAR HOLD] insulin aspart  0-15 Units Subcutaneous TID WC  . [MAR HOLD] insulin glargine  15 Units Subcutaneous QHS  . [MAR HOLD] pantoprazole (PROTONIX) IV  40 mg Intravenous Q24H  . [facility] HOLD] pneumococcal 23 valent vaccine  0.5 mL Intramuscular Once  . [facility] HOLD] simvastatin  40 mg Oral q1800  . [facility] HOLD] sodium chloride  3 mL Intravenous Q12H    Continuous Infusions: . sodium chloride 75 mL/hr at 07/13/12 1941    Past Medical History  Diagnosis Date  . Hypertension   . Type II diabetes mellitus   . Family history of anesthesia complication     "never had anesthesia" (07/14/2012)  . Hematochezia     hospitalized 07/13/2012    Past Surgical History  Procedure Laterality Date  . No past surgeries      Maureen Chatters, RD, LDN Pager #: [phone] After-Hours Pager #: [phone]

## 2012-07-14 NOTE — Interval H&P Note (Signed)
History and Physical Interval Note:  07/14/2012 10:16 AM  Carl Clark  has presented today for surgery, with the diagnosis of blood in stool, anemia  The various methods of treatment have been discussed with the patient and family. After consideration of risks, benefits and other options for treatment, the patient has consented to  Procedure(s): COLONOSCOPY (Left) as a surgical intervention .  The patient's history has been reviewed, patient examined, no change in status, stable for surgery.  I have reviewed the patient's chart and labs.  Questions were answered to the patient's satisfaction.     Carl Clark M  Assessment:  1. Blood in stool.  No definitive source on endoscopy yesterday.  Plan:  1.  Colonoscopy. 2.  Risks (bleeding, infection, bowel perforation that could require surgery, sedation-related changes in cardiopulmonary systems), benefits (identification and possible treatment of source of symptoms, exclusion of certain causes of symptoms), and alternatives (watchful waiting, radiographic imaging studies, empiric medical treatment) of colonoscopy were explained to patient and patient wishes to proceed.  Risks and benefits explained yesterday as well with assistance of Arabic interpreter.

## 2012-07-15 DIAGNOSIS — J309 Allergic rhinitis, unspecified: Secondary | ICD-10-CM

## 2012-07-15 LAB — CBC
HCT: 29.4 % — ABNORMAL LOW (ref 39.0–52.0)
MCHC: 35 g/dL (ref 30.0–36.0)
Platelets: 328 10*3/uL (ref 150–400)
RDW: 12.4 % (ref 11.5–15.5)
WBC: 5.1 10*3/uL (ref 4.0–10.5)

## 2012-07-15 LAB — GLUCOSE, CAPILLARY: Glucose-Capillary: 310 mg/dL — ABNORMAL HIGH (ref 70–99)

## 2012-07-15 MED ORDER — PANTOPRAZOLE SODIUM 40 MG PO TBEC: 40.0000 mg | DELAYED_RELEASE_TABLET | Freq: Every day | ORAL | Status: DC

## 2012-07-15 MED ORDER — CETIRIZINE HCL 10 MG PO TABS: 10.0000 mg | ORAL_TABLET | Freq: Every day | ORAL | Status: DC

## 2012-07-15 MED ORDER — MENTHOL 3 MG MT LOZG
1.0000 | LOZENGE | OROMUCOSAL | Status: DC | PRN
  Administered 2012-07-15: 3 mg via ORAL
  Filled 2012-07-15: qty 9

## 2012-07-15 MED ORDER — FERROUS FUMARATE 325 (106 FE) MG PO TABS: 1.0000 | ORAL_TABLET | Freq: Every day | ORAL | Status: DC

## 2012-07-15 NOTE — Discharge Summary (Signed)
Physician Discharge Summary  Carl Clark [place] OZH:[id number] DOB: [birth year] DOA: 07/13/2012  PCP: Carl Laser, MD  Admit date: 07/13/2012 Discharge date: 07/15/2012  Time spent: >30 minutes  Recommendations for Outpatient Follow-up:  CBC to follow Hgb trend Close follow up to DM and if remains uncontrolled start insulin therapy Recheck BP and adjust medications as needed.  Discharge Diagnoses:  Principal Problem:   Rectal bleeding Active Problems:   Type II or unspecified type diabetes mellitus with unspecified complication, uncontrolled   Unspecified essential hypertension   Discharge Condition: stable and improved. No further rectal bleeding; no abdominal, no nausea or vomiting. Will follow with PCP in 10 days and will be discharge home.  Diet recommendation: low carb diet and heart healthy diet; also advise to increase fiber in his diet.  Filed Weights   07/13/12 1337  Weight: 62.143 kg (137 lb)    History of present illness:  62 Y/O [name] with hx of uncontrolled DM and HTN was sent by his PCP for symptoms of rectal bleeding for past 5 days. Patient infomrs having large amount of bright red bleeding of almost 5-6 episodes since past 5 days. denies hematemesis, nausea , vomiting, abdominal pain, chest pain, palpitations, SOB, headache, dizziness, blurry vision, urinary symptoms. Denies similar symptoms in past. Denies use of NSAIDs.  In the ED patient's vitals were stable. Noted for hb of 11 and stool for occult blood was positive. [facility] called [facility]. [facility] consulted and patient taken for EGD.   Hospital Course:  1-BRBPR: secondary to diverticulum affecting right hepatic flexure area. Status post clipping process by GI.  -will continue PPI for mild gastritis as seen on EGD (done on 4/25)  -started patient on hemocyte daily  -Hgb 10.3 at discharge; no further bleeding episodes and tolerating Pop intake -CBC during follow up with PCP to  follow Hgb level.  2-DM: will continue metformin and glipizide; advise to follow a low carb diet. Will need follow up with PCP and if sugar continue to be uncontrolled, will required insulin therapy.  3-HTN: continue amlodipine   4-HLD: continue statins  5-Allergic rhinitis: start daily zyrtec and minimize outdoor exposure  Procedures:  EGD (mild gastritis)   Colonoscopy (with right hepatic flexure diverticulum oozing blood; S/P clipping by GI)  Consultations:  GI  Discharge Exam: Filed Vitals:   07/14/12 1300 07/14/12 2100 07/15/12 0500 07/15/12 0942  BP: 129/57 140/66 124/71 156/65  Pulse: 77 69 69   Temp: 97.9 F (36.6 C) 98 F (36.7 C) 97.4 F (36.3 C)   TempSrc: Oral     Resp: 18 16 16    Height:      Weight:      SpO2: 100% 99% 99%     General: NAD, afebrile, no abdominal pain, no N/V Cardiovascular: S1 and S2, no rubs or gallops Respiratory: no wheezing, no crackles Abdomen: soft, NT, ND, positive BS Extremities: no edema or cyanosis Neurologic: no focal deficit  Discharge Instructions  Discharge Orders   Future Orders Complete By Expires     Diet - low sodium heart healthy  As directed     Discharge instructions  As directed     Comments:      -Take medications as prescribed -Keep yourself well hydrated -Increase fiber in your diet -follow up with PCP in 10 days        Medication List    TAKE these medications       amLODipine 5 MG tablet  Commonly known as:  NORVASC  Take 5 mg by mouth daily.     cetirizine 10 MG tablet  Commonly known as:  ZYRTEC  Take 1 tablet (10 mg total) by mouth daily.     ferrous fumarate 325 (106 FE) MG Tabs  Commonly known as:  HEMOCYTE - 106 mg FE  Take 1 tablet (106 mg of iron total) by mouth daily.     glipiZIDE 10 MG tablet  Commonly known as:  GLUCOTROL  Take 10 mg by mouth 2 (two) times daily before a meal.     metFORMIN 1000 MG tablet  Commonly known as:  GLUCOPHAGE  Take 1,000 mg by mouth 2 (two)  times daily with a meal.     pantoprazole 40 MG tablet  Commonly known as:  PROTONIX  Take 1 tablet (40 mg total) by mouth daily at 12 noon.     pravastatin 80 MG tablet  Commonly known as:  PRAVACHOL  Take 80 mg by mouth daily.           Follow-up Information   Follow up with [facility], [place]. Schedule an appointment as soon as possible for a visit in 10 days.   Contact information:   [address] [place] [address] [phone]        The results of significant diagnostics from this hospitalization (including imaging, microbiology, ancillary and laboratory) are listed below for reference.     Labs: Basic Metabolic Panel:  Recent Labs Lab 07/13/12 0937  NA 136  K 3.9  CL 99  CO2 28  GLUCOSE 318*  BUN 22  CREATININE 0.77  CALCIUM 9.6   Liver Function Tests:  Recent Labs Lab 07/13/12 0937  AST 11  ALT 8  ALKPHOS 72  BILITOT 0.3  PROT 8.4*  ALBUMIN 3.7   CBC:  Recent Labs Lab 07/13/12 0937 07/13/12 1641 07/14/12 0027 07/14/12 0900 07/15/12 0530  WBC 3.7* 4.3 6.8 5.6 5.1  NEUTROABS 2.2  --   --   --   --   HGB 11.4* 10.3* 10.4* 9.5* 10.3*  HCT 31.7* 28.6* 29.3* 26.6* 29.4*  MCV 88.3 88.0 88.0 87.5 88.8  PLT 354 292 335 297 328   CBG:  Recent Labs Lab 07/14/12 0736 07/14/12 1218 07/14/12 1645 07/14/12 2054 07/15/12 0749  GLUCAP 145* 133* 221* 255* 310*    Signed:  Serina Clark  [facility] 07/15/2012, 10:55 AM

## 2012-07-16 ENCOUNTER — Encounter (HOSPITAL_COMMUNITY): Payer: Self-pay | Admitting: Gastroenterology

## 2013-07-06 ENCOUNTER — Emergency Department (HOSPITAL_COMMUNITY)
Admission: EM | Admit: 2013-07-06 | Discharge: 2013-07-06 | Disposition: A | Discharge status: Home / Self Care | Payer: 59 | Attending: Emergency Medicine | Admitting: Emergency Medicine

## 2013-07-06 ENCOUNTER — Encounter (HOSPITAL_COMMUNITY): Payer: Self-pay | Admitting: Emergency Medicine

## 2013-07-06 ENCOUNTER — Emergency Department (HOSPITAL_COMMUNITY): Payer: 59

## 2013-07-06 DIAGNOSIS — B9789 Other viral agents as the cause of diseases classified elsewhere: Secondary | ICD-10-CM | POA: Insufficient documentation

## 2013-07-06 DIAGNOSIS — I10 Essential (primary) hypertension: Secondary | ICD-10-CM | POA: Insufficient documentation

## 2013-07-06 DIAGNOSIS — E119 Type 2 diabetes mellitus without complications: Secondary | ICD-10-CM | POA: Insufficient documentation

## 2013-07-06 DIAGNOSIS — Z79899 Other long term (current) drug therapy: Secondary | ICD-10-CM | POA: Insufficient documentation

## 2013-07-06 DIAGNOSIS — Z8719 Personal history of other diseases of the digestive system: Secondary | ICD-10-CM | POA: Insufficient documentation

## 2013-07-06 DIAGNOSIS — J029 Acute pharyngitis, unspecified: Secondary | ICD-10-CM | POA: Insufficient documentation

## 2013-07-06 DIAGNOSIS — B349 Viral infection, unspecified: Secondary | ICD-10-CM

## 2013-07-06 MED ORDER — HYDROCODONE-HOMATROPINE 5-1.5 MG/5ML PO SYRP: 5.0000 mL | ORAL_SOLUTION | Freq: Four times a day (QID) | ORAL | Status: DC | PRN

## 2013-07-06 MED ORDER — AZITHROMYCIN 250 MG PO TABS: 250.0000 mg | ORAL_TABLET | Freq: Every day | ORAL | Status: DC

## 2013-07-06 NOTE — ED Provider Notes (Signed)
CSN: [id number]     Arrival date & time 07/06/13  1457 History  This chart was scribed for non-physician practitioner working with Doug SouSam Jacubowitz, MD by Ashley JacobsBrittany Andrews, ED scribe. This patient was seen in room TR07C/TR07C and the patient's care was started at 3:15 PM.   First MD Initiated Contact with Patient 07/06/13 1511     Chief Complaint  Patient presents with  . Cough  . Nasal Congestion     (Consider location/radiation/quality/duration/timing/severity/associated sxs/prior Treatment) HPI HPI Comments: Carl Clark is a 63 y.o. male who presents to the Emergency Department complaining of productive cough and nasal congestion for the past five days. The phlegm that he produces is yellow. He has the associated symptoms of nasal congestion,fatigue, inability to sleep, dark circles under his eyes, and sore throat. Nothing seems to make his symptoms better.  Past Medical History  Diagnosis Date  . Hypertension   . Type II diabetes mellitus   . Family history of anesthesia complication     "never had anesthesia" (07/14/2012)  . Hematochezia     hospitalized 07/13/2012   Past Surgical History  Procedure Laterality Date  . No past surgeries    . Esophagogastroduodenoscopy N/A 07/13/2012    Procedure: ESOPHAGOGASTRODUODENOSCOPY (EGD);  Surgeon: Willis ModenaWilliam Outlaw, MD;  [facility];  Service: Endoscopy;  Laterality: N/A;  pt in ED A8  . Colonoscopy Left 07/14/2012    Procedure: COLONOSCOPY;  Surgeon: Willis ModenaWilliam Outlaw, MD;  [facility];  Service: Endoscopy;  Laterality: Left;   No family history on file. History  Substance Use Topics  . Smoking status: Never Smoker   . Smokeless tobacco: Never Used  . Alcohol Use: No    Review of Systems  Constitutional: Positive for fatigue.  HENT: Positive for congestion and sore throat.   Respiratory: Positive for cough.   Skin:       Under eye darkness   Neurological: Positive for weakness.      Allergies   Review of patient's allergies indicates no known allergies.  Home Medications   Prior to Admission medications   Medication Sig Start Date End Date Taking? Authorizing Provider  amLODipine (NORVASC) 5 MG tablet Take 5 mg by mouth daily.    Historical Provider, MD  cetirizine (ZYRTEC) 10 MG tablet Take 1 tablet (10 mg total) by mouth daily. 07/15/12   Vassie Lollarlos Madera, MD  ferrous fumarate (HEMOCYTE - 106 MG FE) 325 (106 FE) MG TABS Take 1 tablet (106 mg of iron total) by mouth daily. 07/15/12   Vassie Lollarlos Madera, MD  glipiZIDE (GLUCOTROL) 10 MG tablet Take 10 mg by mouth 2 (two) times daily before a meal.    Historical Provider, MD  metFORMIN (GLUCOPHAGE) 1000 MG tablet Take 1,000 mg by mouth 2 (two) times daily with a meal.    Historical Provider, MD  pantoprazole (PROTONIX) 40 MG tablet Take 1 tablet (40 mg total) by mouth daily at 12 noon. 07/15/12   Vassie Lollarlos Madera, MD  pravastatin (PRAVACHOL) 80 MG tablet Take 80 mg by mouth daily.    Historical Provider, MD   BP 157/83  Pulse 93  Temp(Src) 99 F (37.2 C) (Oral)  Resp 22  SpO2 98% Physical Exam  Nursing note and vitals reviewed. Constitutional: He is oriented to person, place, and time. He appears well-developed and well-nourished. No distress.  Patient coughing throughout exam and interview.   HENT:  Head: Normocephalic and atraumatic.  Mouth/Throat: Oropharynx is clear and moist. No oropharyngeal exudate.  Eyes: Conjunctivae and  EOM are normal.  Neck: Normal range of motion.  Cardiovascular: Normal rate and regular rhythm.  Exam reveals no gallop and no friction rub.   No murmur heard. Pulmonary/Chest: Effort normal and breath sounds normal. He has no wheezes. He has no rales. He exhibits no tenderness.  Abdominal: Soft. He exhibits no distension. There is no tenderness. There is no rebound.  Musculoskeletal: Normal range of motion.  Neurological: He is alert and oriented to person, place, and time. Coordination normal.  Speech is  goal-oriented. Moves limbs without ataxia.   Skin: Skin is warm and dry.  Psychiatric: He has a normal mood and affect. His behavior is normal.    ED Course  Procedures (including critical care time) DIAGNOSTIC STUDIES: Oxygen Saturation is 98% on room air, normal by my interpretation.    COORDINATION OF CARE:  3:20 PM Discussed course of care with pt. Pt understands and agrees.    Labs Review Labs Reviewed - No data to display  Imaging Review Dg Chest 2 View  07/06/2013   CLINICAL DATA:  Cough. Congestion for 5 days. Chest pressure. Hypertension and diabetes.  EXAM: CHEST  2 VIEW  COMPARISON:  None.  FINDINGS: Midline trachea. Normal heart size and mediastinal contours. No pleural effusion or pneumothorax. Clear lungs.  IMPRESSION: No acute cardiopulmonary disease.   Electronically Signed   By: Jeronimo GreavesKyle  Talbot M.D.   On: 07/06/2013 16:41     EKG Interpretation None      MDM   Final diagnoses:  Viral illness    Patient's chest xray unremarkable for acute changes. Patient has a productive cough and complaining of body aches for the past 5 days. Vitals stable and patient afebrile. Patient likely has a viral illness and will be discharged with a Z-pack for possible bacterial infection and hycodan. Patient advised to return with worsening or concerning symptoms.   I personally performed the services described in this documentation, which was scribed in my presence. The recorded information has been reviewed and is accurate.    Emilia BeckKaitlyn Avonlea Sima, [place]-C 07/07/13 [phone]

## 2013-07-06 NOTE — ED Notes (Signed)
Pt c/o flu type symptoms for 4-5 days.  Pt with cough, nasal congestion, sore throat.

## 2013-07-06 NOTE — Discharge Instructions (Signed)
Take azithromycin as directed until gone. Take hycodan as needed for cough. Refer to attached documents for more information. Return to the ED with worsening or concerning symptoms.  °

## 2013-07-06 NOTE — ED Notes (Signed)
Patient transported to X-ray 

## 2013-07-06 NOTE — ED Notes (Addendum)
Pt st's he has been sick with flu type symptoms x's 1 week.  St's he has been sleeping a lot.  C/o productive cough Yellow

## 2013-07-07 NOTE — ED Provider Notes (Signed)
History/physical exam/procedure(s) were performed by non-physician practitioner and as supervising physician I was immediately available for consultation/collaboration. I have reviewed all notes and am in agreement with care and plan.   Tiyanna Larcom S Alaira Level, MD 07/07/13 1949 

## 2013-09-08 ENCOUNTER — Ambulatory Visit (INDEPENDENT_AMBULATORY_CARE_PROVIDER_SITE_OTHER): Payer: 59

## 2013-09-08 ENCOUNTER — Ambulatory Visit (INDEPENDENT_AMBULATORY_CARE_PROVIDER_SITE_OTHER): Payer: 59 | Admitting: Family Medicine

## 2013-09-08 VITALS — BP 128/72 | HR 71 | Temp 98.0°F | Resp 16 | Ht 68.0 in | Wt 141.0 lb

## 2013-09-08 DIAGNOSIS — M25561 Pain in right knee: Secondary | ICD-10-CM

## 2013-09-08 DIAGNOSIS — M25469 Effusion, unspecified knee: Secondary | ICD-10-CM

## 2013-09-08 DIAGNOSIS — M25461 Effusion, right knee: Secondary | ICD-10-CM

## 2013-09-08 DIAGNOSIS — E119 Type 2 diabetes mellitus without complications: Secondary | ICD-10-CM

## 2013-09-08 DIAGNOSIS — M25569 Pain in unspecified knee: Secondary | ICD-10-CM

## 2013-09-08 DIAGNOSIS — R509 Fever, unspecified: Secondary | ICD-10-CM

## 2013-09-08 DIAGNOSIS — H01139 Eczematous dermatitis of unspecified eye, unspecified eyelid: Secondary | ICD-10-CM

## 2013-09-08 LAB — POCT CBC
Granulocyte percent: 41.9 %G (ref 37–80)
HEMATOCRIT: 35.4 % — AB (ref 43.5–53.7)
HEMOGLOBIN: 11.3 g/dL — AB (ref 14.1–18.1)
LYMPH, POC: 1.2 (ref 0.6–3.4)
MCH, POC: 30.6 pg (ref 27–31.2)
MCHC: 31.9 g/dL (ref 31.8–35.4)
MCV: 95.8 fL (ref 80–97)
MID (cbc): 0.4 (ref 0–0.9)
MPV: 8.3 fL (ref 0–99.8)
POC GRANULOCYTE: 1.1 — AB (ref 2–6.9)
POC LYMPH PERCENT: 43.2 %L (ref 10–50)
POC MID %: 14.9 %M — AB (ref 0–12)
Platelet Count, POC: 313 10*3/uL (ref 142–424)
RBC: 3.69 M/uL — AB (ref 4.69–6.13)
RDW, POC: 12.7 %
WBC: 2.7 10*3/uL — AB (ref 4.6–10.2)

## 2013-09-08 LAB — GLUCOSE, POCT (MANUAL RESULT ENTRY): POC GLUCOSE: 324 mg/dL — AB (ref 70–99)

## 2013-09-08 MED ORDER — TRAMADOL HCL 50 MG PO TABS: 50.0000 mg | ORAL_TABLET | Freq: Every evening | ORAL | Status: DC | PRN

## 2013-09-08 MED ORDER — DESONIDE 0.05 % EX CREA: TOPICAL_CREAM | Freq: Two times a day (BID) | CUTANEOUS | Status: DC

## 2013-09-08 MED ORDER — MELOXICAM 15 MG PO TABS: 15.0000 mg | ORAL_TABLET | Freq: Every day | ORAL | Status: DC

## 2013-09-08 MED ORDER — CETIRIZINE HCL 10 MG PO TABS: 10.0000 mg | ORAL_TABLET | Freq: Every day | ORAL | Status: DC

## 2013-09-08 NOTE — Progress Notes (Signed)
Subjective:    Patient ID: Carl Clark, male    DOB: 10/26/1950, 63 y.o.   MRN: [id number]  09/08/2013  Knee Pain   Knee Pain    This 63 y.o. male presents for evaluation of knee pain R.  Onset one week ago.  No fall or injury.  Has been carrying heavy items and also still works.  Packing items and selection during employment.  +swelling intermittently.  Takes Advil every 6 hours for past five days.  . Pain recurs at nighttime; Advil does work.  Running fever.  No redness to knee.  Swelling worse at night.   Cannot pray or play due to knee pain.  No similar symptoms in the past.  No ice or heat to knee.  No topical lotions.  Patient also complaining of scaling, dry rash around eyes for the past year.  Intermittent itching.  Also, sometimes lips will be scaly and swell.  No previous evaluation.  Not sure what he washes his face with.  No topical creams to areas.    Has been fasting this month for religious reasons; ate a large amount of sweets this morning before sunrise.  Administers 20 units of insulin every morning. Followed up with PCP one week ago.   Review of Systems  Constitutional: Positive for fever. Negative for chills and diaphoresis.  Eyes: Negative for photophobia, pain, redness, itching and visual disturbance.  Musculoskeletal: Positive for arthralgias, gait problem and joint swelling.  Skin: Positive for color change and rash. Negative for pallor and wound.    Past Medical History  Diagnosis Date  . Hypertension   . Type II diabetes mellitus   . Family history of anesthesia complication     "never had anesthesia" (07/14/2012)  . Hematochezia     hospitalized 07/13/2012   Past Surgical History  Procedure Laterality Date  . No past surgeries    . Esophagogastroduodenoscopy N/A 07/13/2012    Procedure: ESOPHAGOGASTRODUODENOSCOPY (EGD);  Surgeon: Willis ModenaWilliam Outlaw, MD;  [facility];  Service: Endoscopy;  Laterality: N/A;  pt in ED A8  . Colonoscopy  Left 07/14/2012    Procedure: COLONOSCOPY;  Surgeon: Willis ModenaWilliam Outlaw, MD;  [facility];  Service: Endoscopy;  Laterality: Left;    No Known Allergies Current Outpatient Prescriptions  Medication Sig Dispense Refill  . amLODipine (NORVASC) 5 MG tablet Take 5 mg by mouth daily.      . cetirizine (ZYRTEC) 10 MG tablet Take 1 tablet (10 mg total) by mouth daily.  30 tablet  11  . glipiZIDE (GLUCOTROL) 10 MG tablet Take 10 mg by mouth 2 (two) times daily before a meal.      . metFORMIN (GLUCOPHAGE) 1000 MG tablet Take 1,000 mg by mouth 2 (two) times daily with a meal.      . desonide (DESOWEN) 0.05 % cream Apply topically 2 (two) times daily.  15 g  1  . ferrous fumarate (HEMOCYTE - 106 MG FE) 325 (106 FE) MG TABS Take 1 tablet (106 mg of iron total) by mouth daily.  30 each  1  . meloxicam (MOBIC) 15 MG tablet Take 1 tablet (15 mg total) by mouth daily.  30 tablet  0  . pantoprazole (PROTONIX) 40 MG tablet Take 1 tablet (40 mg total) by mouth daily at 12 noon.  30 tablet  1  . pravastatin (PRAVACHOL) 80 MG tablet Take 80 mg by mouth daily.      . traMADol (ULTRAM) 50 MG tablet Take 1-2 tablets (  50-100 mg total) by mouth at bedtime as needed for moderate pain.  30 tablet  0   No current facility-administered medications for this visit.   History   Social History  . Marital Status: Widowed    Spouse Name: N/A    Number of Children: N/A  . Years of Education: N/A   Occupational History  . Not on file.   Social History Main Topics  . Smoking status: Never Smoker   . Smokeless tobacco: Never Used  . Alcohol Use: No  . Drug Use: No  . Sexual Activity: Not Currently   Other Topics Concern  . Not on file   Social History Narrative  . No narrative on file       Objective:    BP 128/72  Pulse 71  Temp(Src) 98 F (36.7 C)  Resp 16  Ht 5\' 8"  (1.727 m)  Wt 141 lb (63.957 kg)  BMI 21.44 kg/m2  SpO2 98% Physical Exam  Nursing note and vitals reviewed. Constitutional:  He is oriented to person, place, and time. He appears well-developed and well-nourished. No distress.  HENT:  Head: Normocephalic and atraumatic.  Eyes: Conjunctivae and EOM are normal. Pupils are equal, round, and reactive to light.  Musculoskeletal:       Right knee: He exhibits swelling and effusion. He exhibits normal range of motion, no ecchymosis, no deformity, no laceration, no erythema, normal patellar mobility, no bony tenderness and normal meniscus. No tenderness found. No medial joint line, no lateral joint line, no MCL, no LCL and no patellar tendon tenderness noted.  R knee: small joint effusion; full extension and flexion without pain or limitation; no joint line tenderness; McMurray's negative; Lachman's and anterior drawer negative.  No warmth. No erythema.  Neurological: He is alert and oriented to person, place, and time.  Skin: Skin is warm and dry. Rash noted. He is not diaphoretic.  Hyperpigmented slightly scaling and dry rash peri-orbital region B.  No swelling.  Lips with mild scaling but without swelling.   Results for orders placed in visit on 09/08/13  POCT CBC      Result Value Ref Range   WBC 2.7 (*) 4.6 - 10.2 K/uL   Lymph, poc 1.2  0.6 - 3.4   POC LYMPH PERCENT 43.2  10 - 50 %L   MID (cbc) 0.4  0 - 0.9   POC MID % 14.9 (*) 0 - 12 %M   POC Granulocyte 1.1 (*) 2 - 6.9   Granulocyte percent 41.9  37 - 80 %G   RBC 3.69 (*) 4.69 - 6.13 M/uL   Hemoglobin 11.3 (*) 14.1 - 18.1 g/dL   HCT, POC 16.1 (*) 09.6 - 53.7 %   MCV 95.8  80 - 97 fL   MCH, POC 30.6  27 - 31.2 pg   MCHC 31.9  31.8 - 35.4 g/dL   RDW, POC 04.5     Platelet Count, POC 313  142 - 424 K/uL   MPV 8.3  0 - 99.8 fL  GLUCOSE, POCT (MANUAL RESULT ENTRY)      Result Value Ref Range   POC Glucose 324 (*) 70 - 99 mg/dl   [facility] reading (PRIMARY) by  Dr. Katrinka Blazing.  R KNEE:  PATELLAR SPURRING; DEGENERATIVE CHANGES.      Assessment & Plan:  Pain in right knee - Plan: DG Knee Complete 4 Views Right,  traMADol (ULTRAM) 50 MG tablet  Knee swelling, right - Plan: DG Knee Complete 4 Views  Right, meloxicam (MOBIC) 15 MG tablet  Fever, unspecified fever cause - Plan: POCT CBC  Type II or unspecified type diabetes mellitus without mention of complication, not stated as uncontrolled - Plan: POCT glucose (manual entry)  Eczematous dermatitis of eyelid, unspecified laterality - Plan: cetirizine (ZYRTEC) 10 MG tablet, desonide (DESOWEN) 0.05 % cream  1. Pain R knee:  New.  Rx for Meloxicam and Tramadol provided. 2.  R knee swelling:  New.  Rx for Meloxicam provided to take daily.  Recommend rest, icing, elevation.  If no improvement in 2 weeks, call office for ortho referral.  3. OA R knee:  New.  Rx for Meloxicam provided; home exercise program provided.  Recommend icing knee bid for 15-20 minutes. 4.  Fever:  New; reported but not measured; no fever in office; low WBC count; knee without warmth or tenderness.  If persists, return to office. 5. DMII: uncontrolled due to non-compliance with diabetic diet; recommend close follow-up with PCP. 6.  Dermatitis facial:  New. Rx for Desowen cream to apply bid; rx for Zyrtec also provided.    Meds ordered this encounter  Medications  . cetirizine (ZYRTEC) 10 MG tablet    Sig: Take 1 tablet (10 mg total) by mouth daily.    Dispense:  30 tablet    Refill:  11  . meloxicam (MOBIC) 15 MG tablet    Sig: Take 1 tablet (15 mg total) by mouth daily.    Dispense:  30 tablet    Refill:  0  . traMADol (ULTRAM) 50 MG tablet    Sig: Take 1-2 tablets (50-100 mg total) by mouth at bedtime as needed for moderate pain.    Dispense:  30 tablet    Refill:  0  . desonide (DESOWEN) 0.05 % cream    Sig: Apply topically 2 (two) times daily.    Dispense:  15 g    Refill:  1    No Follow-up on file.    Nilda SimmerKristi Smith, M.D.  [facility]   [address] [place], [place]  [id number] [phone] phone [phone] fax

## 2013-09-08 NOTE — Patient Instructions (Signed)
Knee Exercises EXERCISES RANGE OF MOTION(ROM) AND STRETCHING EXERCISES These exercises may help you when beginning to rehabilitate your injury. Your symptoms may resolve with or without further involvement from your physician, physical therapist or athletic trainer. While completing these exercises, remember:   Restoring tissue flexibility helps normal motion to return to the joints. This allows healthier, less painful movement and activity.  An effective stretch should be held for at least 30 seconds.  A stretch should never be painful. You should only feel a gentle lengthening or release in the stretched tissue. STRETCH - Knee Extension, Prone  Lie on your stomach on a firm surface, such as a bed or countertop. Place your right / left knee and leg just beyond the edge of the surface. You may wish to place a towel under the far end of your right / left thigh for comfort.  Relax your leg muscles and allow gravity to straighten your knee. Your clinician may advise you to add an ankle weight if more resistance is helpful for you.  You should feel a stretch in the back of your right / left knee. Hold this position for __________ seconds. Repeat __________ times. Complete this stretch __________ times per day. * Your physician, physical therapist or athletic trainer may ask you to add ankle weight to enhance your stretch.  RANGE OF MOTION - Knee Flexion, Active  Lie on your back with both knees straight. (If this causes back discomfort, bend your opposite knee, placing your foot flat on the floor.)  Slowly slide your heel back toward your buttocks until you feel a gentle stretch in the front of your knee or thigh.  Hold for __________ seconds. Slowly slide your heel back to the starting position. Repeat __________ times. Complete this exercise __________ times per day.  STRETCH - Quadriceps, Prone   Lie on your stomach on a firm surface, such as a bed or padded floor.  Bend your right /  left knee and grasp your ankle. If you are unable to reach, your ankle or pant leg, use a belt around your foot to lengthen your reach.  Gently pull your heel toward your buttocks. Your knee should not slide out to the side. You should feel a stretch in the front of your thigh and/or knee.  Hold this position for __________ seconds. Repeat __________ times. Complete this stretch __________ times per day.  STRETCH - Hamstrings, Supine   Lie on your back. Loop a belt or towel over the ball of your right / left foot.  Straighten your right / left knee and slowly pull on the belt to raise your leg. Do not allow the right / left knee to bend. Keep your opposite leg flat on the floor.  Raise the leg until you feel a gentle stretch behind your right / left knee or thigh. Hold this position for __________ seconds. Repeat __________ times. Complete this stretch __________ times per day.  STRENGTHENING EXERCISES These exercises may help you when beginning to rehabilitate your injury. They may resolve your symptoms with or without further involvement from your physician, physical therapist or athletic trainer. While completing these exercises, remember:   Muscles can gain both the endurance and the strength needed for everyday activities through controlled exercises.  Complete these exercises as instructed by your physician, physical therapist or athletic trainer. Progress the resistance and repetitions only as guided.  You may experience muscle soreness or fatigue, but the pain or discomfort you are trying to eliminate should   never worsen during these exercises. If this pain does worsen, stop and make certain you are following the directions exactly. If the pain is still present after adjustments, discontinue the exercise until you can discuss the trouble with your clinician. STRENGTH - Quadriceps, Isometrics  Lie on your back with your right / left leg extended and your opposite knee  bent.  Gradually tense the muscles in the front of your right / left thigh. You should see either your knee cap slide up toward your hip or increased dimpling just above the knee. This motion will push the back of the knee down toward the floor/mat/bed on which you are lying.  Hold the muscle as tight as you can without increasing your pain for __________ seconds.  Relax the muscles slowly and completely in between each repetition. Repeat __________ times. Complete this exercise __________ times per day.  STRENGTH - Quadriceps, Short Arcs   Lie on your back. Place a __________ inch towel roll under your knee so that the knee slightly bends.  Raise only your lower leg by tightening the muscles in the front of your thigh. Do not allow your thigh to rise.  Hold this position for __________ seconds. Repeat __________ times. Complete this exercise __________ times per day.  OPTIONAL ANKLE WEIGHTS: Begin with ____________________, but DO NOT exceed ____________________. Increase in 1 pound/0.5 kilogram increments.  STRENGTH - Quadriceps, Straight Leg Raises  Quality counts! Watch for signs that the quadriceps muscle is working to insure you are strengthening the correct muscles and not "cheating" by substituting with healthier muscles.  Lay on your back with your right / left leg extended and your opposite knee bent.  Tense the muscles in the front of your right / left thigh. You should see either your knee cap slide up or increased dimpling just above the knee. Your thigh may even quiver.  Tighten these muscles even more and raise your leg 4 to 6 inches off the floor. Hold for __________ seconds.  Keeping these muscles tense, lower your leg.  Relax the muscles slowly and completely in between each repetition. Repeat __________ times. Complete this exercise __________ times per day.  STRENGTH - Hamstring, Curls  Lay on your stomach with your legs extended. (If you lay on a bed, your feet  may hang over the edge.)  Tighten the muscles in the back of your thigh to bend your right / left knee up to 90 degrees. Keep your hips flat on the bed/floor.  Hold this position for __________ seconds.  Slowly lower your leg back to the starting position. Repeat __________ times. Complete this exercise __________ times per day.  OPTIONAL ANKLE WEIGHTS: Begin with ____________________, but DO NOT exceed ____________________. Increase in 1 pound/0.5 kilogram increments.  STRENGTH - Quadriceps, Squats  Stand in a door frame so that your feet and knees are in line with the frame.  Use your hands for balance, not support, on the frame.  Slowly lower your weight, bending at the hips and knees. Keep your lower legs upright so that they are parallel with the door frame. Squat only within the range that does not increase your knee pain. Never let your hips drop below your knees.  Slowly return upright, pushing with your legs, not pulling with your hands. Repeat __________ times. Complete this exercise __________ times per day.  STRENGTH - Quadriceps, Wall Slides  Follow guidelines for form closely. Increased knee pain often results from poorly placed feet or knees.  Lean against   a smooth wall or door and walk your feet out 18-24 inches. Place your feet hip-width apart.  Slowly slide down the wall or door until your knees bend __________ degrees.* Keep your knees over your heels, not your toes, and in line with your hips, not falling to either side.  Hold for __________ seconds. Stand up to rest for __________ seconds in between each repetition. Repeat __________ times. Complete this exercise __________ times per day. * Your physician, physical therapist or athletic trainer will alter this angle based on your symptoms and progress. Document Released: 01/19/2005 Document Revised: 05/30/2011 Document Reviewed: 06/19/2008 [employer] Patient Information 2015 [employer]. This information is not  intended to replace advice given to you by your health care provider. Make sure you discuss any questions you have with your health care provider.  

## 2014-05-31 ENCOUNTER — Encounter (HOSPITAL_COMMUNITY): Payer: Self-pay

## 2014-05-31 ENCOUNTER — Emergency Department (HOSPITAL_COMMUNITY)
Admission: EM | Admit: 2014-05-31 | Discharge: 2014-05-31 | Disposition: A | Discharge status: Home / Self Care | Payer: No Typology Code available for payment source | Attending: Emergency Medicine | Admitting: Emergency Medicine

## 2014-05-31 DIAGNOSIS — Z794 Long term (current) use of insulin: Secondary | ICD-10-CM | POA: Insufficient documentation

## 2014-05-31 DIAGNOSIS — R21 Rash and other nonspecific skin eruption: Secondary | ICD-10-CM

## 2014-05-31 DIAGNOSIS — I10 Essential (primary) hypertension: Secondary | ICD-10-CM | POA: Insufficient documentation

## 2014-05-31 DIAGNOSIS — E119 Type 2 diabetes mellitus without complications: Secondary | ICD-10-CM | POA: Insufficient documentation

## 2014-05-31 DIAGNOSIS — R197 Diarrhea, unspecified: Secondary | ICD-10-CM

## 2014-05-31 DIAGNOSIS — Z79899 Other long term (current) drug therapy: Secondary | ICD-10-CM | POA: Insufficient documentation

## 2014-05-31 DIAGNOSIS — M25461 Effusion, right knee: Secondary | ICD-10-CM

## 2014-05-31 DIAGNOSIS — Z7982 Long term (current) use of aspirin: Secondary | ICD-10-CM | POA: Insufficient documentation

## 2014-05-31 DIAGNOSIS — Z8719 Personal history of other diseases of the digestive system: Secondary | ICD-10-CM | POA: Insufficient documentation

## 2014-05-31 LAB — CBC WITH DIFFERENTIAL/PLATELET
Basophils Absolute: 0.1 10*3/uL (ref 0.0–0.1)
Basophils Relative: 2 % — ABNORMAL HIGH (ref 0–1)
EOS ABS: 0.7 10*3/uL (ref 0.0–0.7)
EOS PCT: 15 % — AB (ref 0–5)
HEMATOCRIT: 41.3 % (ref 39.0–52.0)
Hemoglobin: 14.1 g/dL (ref 13.0–17.0)
LYMPHS ABS: 1.3 10*3/uL (ref 0.7–4.0)
Lymphocytes Relative: 26 % (ref 12–46)
MCH: 31.5 pg (ref 26.0–34.0)
MCHC: 34.1 g/dL (ref 30.0–36.0)
MCV: 92.4 fL (ref 78.0–100.0)
Monocytes Absolute: 0.6 10*3/uL (ref 0.1–1.0)
Monocytes Relative: 12 % (ref 3–12)
NEUTROS ABS: 2.2 10*3/uL (ref 1.7–7.7)
NEUTROS PCT: 45 % (ref 43–77)
Platelets: 332 10*3/uL (ref 150–400)
RBC: 4.47 MIL/uL (ref 4.22–5.81)
RDW: 12.3 % (ref 11.5–15.5)
WBC: 4.8 10*3/uL (ref 4.0–10.5)

## 2014-05-31 LAB — I-STAT CHEM 8, ED
BUN: 17 mg/dL (ref 6–23)
CALCIUM ION: 1.29 mmol/L (ref 1.13–1.30)
Chloride: 100 mmol/L (ref 96–112)
Creatinine, Ser: 0.8 mg/dL (ref 0.50–1.35)
GLUCOSE: 214 mg/dL — AB (ref 70–99)
HCT: 44 % (ref 39.0–52.0)
Hemoglobin: 15 g/dL (ref 13.0–17.0)
POTASSIUM: 4.5 mmol/L (ref 3.5–5.1)
SODIUM: 139 mmol/L (ref 135–145)
TCO2: 24 mmol/L (ref 0–100)

## 2014-05-31 MED ORDER — CEPHALEXIN 500 MG PO CAPS: 500.0000 mg | ORAL_CAPSULE | Freq: Three times a day (TID) | ORAL | Status: DC

## 2014-05-31 MED ORDER — LISINOPRIL-HYDROCHLOROTHIAZIDE 20-12.5 MG PO TABS: 1.0000 | ORAL_TABLET | Freq: Every day | ORAL | Status: DC

## 2014-05-31 MED ORDER — CEPHALEXIN 250 MG PO CAPS
500.0000 mg | ORAL_CAPSULE | Freq: Once | ORAL | Status: AC
  Administered 2014-05-31: 500 mg via ORAL
  Filled 2014-05-31: qty 2

## 2014-05-31 MED ORDER — GLIPIZIDE 10 MG PO TABS: 10.0000 mg | ORAL_TABLET | Freq: Two times a day (BID) | ORAL | Status: DC

## 2014-05-31 MED ORDER — INSULIN DETEMIR 100 UNIT/ML FLEXPEN: 20.0000 [IU] | PEN_INJECTOR | Freq: Every day | SUBCUTANEOUS | Status: DC

## 2014-05-31 MED ORDER — MELOXICAM 15 MG PO TABS: 15.0000 mg | ORAL_TABLET | Freq: Every day | ORAL | Status: DC

## 2014-05-31 MED ORDER — METFORMIN HCL 1000 MG PO TABS: 1000.0000 mg | ORAL_TABLET | Freq: Two times a day (BID) | ORAL | Status: DC

## 2014-05-31 MED ORDER — PANTOPRAZOLE SODIUM 40 MG PO TBEC: 40.0000 mg | DELAYED_RELEASE_TABLET | Freq: Every day | ORAL | Status: DC

## 2014-05-31 MED ORDER — HYDROCORTISONE 1 % EX CREA: 1.0000 "application " | TOPICAL_CREAM | Freq: Two times a day (BID) | CUTANEOUS | Status: DC

## 2014-05-31 NOTE — ED Provider Notes (Signed)
CSN: [id number]     Arrival date & time 05/31/14  1125 History   First MD Initiated Contact with Patient 05/31/14 1155     Chief Complaint  Patient presents with  . Diarrhea  . Mass     (Consider location/radiation/quality/duration/timing/severity/associated sxs/prior Treatment) HPI Comments: Pt has recent URI with runny noset and cough - then developed subsequent rash on the neck 7 days ago and diarrhea 5 days ago - he is having night sweats.  Has abd pain with BM's.   Diarrhea is non watery and non bloody - soft or loose, several times / day.  PMH for DM and Htn and does not take his meds. Out of some of his meds.   Patient is a 64 y.o. male presenting with diarrhea. The history is provided by the patient. The history is limited by a language barrier. A language interpreter was used.  Diarrhea   Past Medical History  Diagnosis Date  . Hypertension   . Type II diabetes mellitus   . Family history of anesthesia complication     "never had anesthesia" (07/14/2012)  . Hematochezia     hospitalized 07/13/2012   Past Surgical History  Procedure Laterality Date  . No past surgeries    . Esophagogastroduodenoscopy N/A 07/13/2012    Procedure: ESOPHAGOGASTRODUODENOSCOPY (EGD);  Surgeon: Willis Modena, MD;  Location: [facility] ENDOSCOPY;  Service: Endoscopy;  Laterality: N/A;  pt in ED A8  . Colonoscopy Left 07/14/2012    Procedure: COLONOSCOPY;  Surgeon: Willis Modena, MD;  Location: [facility] ENDOSCOPY;  Service: Endoscopy;  Laterality: Left;   History reviewed. No pertinent family history. History  Substance Use Topics  . Smoking status: Never Smoker   . Smokeless tobacco: Never Used  . Alcohol Use: No    Review of Systems  Gastrointestinal: Positive for diarrhea.  All other systems reviewed and are negative.     Allergies  Review of patient's allergies indicates no known allergies.  Home Medications   Prior to Admission medications   Medication Sig Start Date End Date Taking?  Authorizing Provider  aspirin EC 81 MG tablet Take 81 mg by mouth daily.   Yes Historical Provider, MD  Cholecalciferol (VITAMIN D) 2000 UNITS CAPS Take 2,000 Units by mouth daily.   Yes Historical Provider, MD  Insulin Pen Needle 31G X 5 MM MISC by Does not apply route.   Yes Historical Provider, MD  cephALEXin (KEFLEX) 500 MG capsule Take 1 capsule (500 mg total) by mouth 3 (three) times daily. 05/31/14   Eber Hong, MD  cetirizine (ZYRTEC) 10 MG tablet Take 1 tablet (10 mg total) by mouth daily. Patient not taking: Reported on 05/31/2014 09/08/13   Ethelda Chick, MD  desonide (DESOWEN) 0.05 % cream Apply topically 2 (two) times daily. Patient not taking: Reported on 05/31/2014 09/08/13   Ethelda Chick, MD  ferrous fumarate (HEMOCYTE - 106 MG FE) 325 (106 FE) MG TABS Take 1 tablet (106 mg of iron total) by mouth daily. Patient not taking: Reported on 05/31/2014 07/15/12   Vassie Loll, MD  glipiZIDE (GLUCOTROL) 10 MG tablet Take 1 tablet (10 mg total) by mouth 2 (two) times daily before a meal. 05/31/14   Eber Hong, MD  hydrocortisone cream 1 % Apply 1 application topically 2 (two) times daily. Do not apply to face 05/31/14   Eber Hong, MD  Insulin Detemir (LEVEMIR) 100 UNIT/ML Pen Inject 20 Units into the skin daily at 10 pm. 05/31/14   Eber Hong, MD  lisinopril-hydrochlorothiazide (PRINZIDE,ZESTORETIC) 20-12.5 MG per tablet Take 1 tablet by mouth daily. 05/31/14   Eber Hong, MD  meloxicam (MOBIC) 15 MG tablet Take 1 tablet (15 mg total) by mouth daily. 05/31/14   Eber Hong, MD  metFORMIN (GLUCOPHAGE) 1000 MG tablet Take 1 tablet (1,000 mg total) by mouth 2 (two) times daily with a meal. 05/31/14   Eber Hong, MD  pantoprazole (PROTONIX) 40 MG tablet Take 1 tablet (40 mg total) by mouth daily at 12 noon. 05/31/14   Eber Hong, MD  traMADol (ULTRAM) 50 MG tablet Take 1-2 tablets (50-100 mg total) by mouth at bedtime as needed for moderate pain. Patient not taking: Reported on 05/31/2014  09/08/13   Ethelda Chick, MD   BP 125/83 mmHg  Pulse 84  Temp(Src) 97.9 F (36.6 C) (Oral)  Resp 16  Ht  (1.676 m)  Wt 147 lb 4.8 oz (66.815 kg)  BMI 23.79 kg/m2  SpO2 97% Physical Exam  Constitutional: He appears well-developed and well-nourished. No distress.  HENT:  Head: Normocephalic and atraumatic.  Mouth/Throat: Oropharynx is clear and moist. No oropharyngeal exudate.  Eyes: Conjunctivae and EOM are normal. Pupils are equal, round, and reactive to light. Right eye exhibits no discharge. Left eye exhibits no discharge. No scleral icterus.  Neck: Normal range of motion. Neck supple. No JVD present. No thyromegaly present.  Cardiovascular: Normal rate, regular rhythm, normal heart sounds and intact distal pulses.  Exam reveals no gallop and no friction rub.   No murmur heard. Pulmonary/Chest: Effort normal and breath sounds normal. No respiratory distress. He has no wheezes. He has no rales.  Abdominal: Soft. Bowel sounds are normal. He exhibits no distension and no mass. There is no tenderness.  Musculoskeletal: Normal range of motion. He exhibits no edema or tenderness.  Lymphadenopathy:    He has no cervical adenopathy.  Neurological: He is alert. Coordination normal.  Skin: Skin is warm and dry. No rash noted. No erythema.  Psychiatric: He has a normal mood and affect. His behavior is normal.  Nursing note and vitals reviewed.   ED Course  Procedures (including critical care time) Labs Review Labs Reviewed  CBC WITH DIFFERENTIAL/PLATELET - Abnormal; Notable for the following:    Eosinophils Relative 15 (*)    Basophils Relative 2 (*)    All other components within normal limits  I-STAT CHEM 8, ED - Abnormal; Notable for the following:    Glucose, Bld 214 (*)    All other components within normal limits  CBG MONITORING, ED    Imaging Review No results found.    MDM   Final diagnoses:  Rash  Diarrhea   The patient has normal vital signs, I am  somewhat concerned about his night sweats, he has no respiratory symptoms and normal vital signs but does have a rash across the posterior neck with some lymphadenopathy. I will treat this with antibiotics in case this is a bacterial superinfection on top of his pruritic skin however it is noted that he does have a similar mild rash underneath both eyebrows and his mustache and does endorse using a hair dye last used 2 weeks ago. Will prescribe a cortisone cream for those other areas and an antibiotic for the neck, check labs because he has been taking his medications and consistently and is now out. Blood pressure looks okay, check CBG.  Labs unremarkable - VS normal  Refilled home meds  Meds given in ED:  Medications  cephALEXin (KEFLEX) capsule 500  mg (500 mg Oral Given 05/31/14 1318)    New Prescriptions   CEPHALEXIN (KEFLEX) 500 MG CAPSULE    Take 1 capsule (500 mg total) by mouth 3 (three) times daily.   HYDROCORTISONE CREAM 1 %    Apply 1 application topically 2 (two) times daily. Do not apply to face      Eber HongBrian Finlee Concepcion, MD 05/31/14 1526

## 2014-05-31 NOTE — ED Notes (Signed)
Onset 5 days diarrhea, 3 times day, pt brought stool sample with him today.  Onset 7 days painful knot to left side of neck.

## 2014-05-31 NOTE — Discharge Instructions (Signed)
Your tests have been normal - start the medications as prescribed - see your doctor on Monday for recheck and medication review / management.  Please call your doctor for a followup appointment within 24-48 hours. When you talk to your doctor please let them know that you were seen in the emergency department and have them acquire all of your records so that they can discuss the findings with you and formulate a treatment plan to fully care for your new and ongoing problems.

## 2014-06-20 ENCOUNTER — Emergency Department (HOSPITAL_COMMUNITY): Payer: No Typology Code available for payment source

## 2014-06-20 ENCOUNTER — Encounter (HOSPITAL_COMMUNITY): Payer: Self-pay | Admitting: Emergency Medicine

## 2014-06-20 ENCOUNTER — Emergency Department (HOSPITAL_COMMUNITY)
Admission: EM | Admit: 2014-06-20 | Discharge: 2014-06-21 | Disposition: A | Discharge status: Home / Self Care | Payer: No Typology Code available for payment source | Attending: Emergency Medicine | Admitting: Emergency Medicine

## 2014-06-20 DIAGNOSIS — Z794 Long term (current) use of insulin: Secondary | ICD-10-CM | POA: Insufficient documentation

## 2014-06-20 DIAGNOSIS — Z7982 Long term (current) use of aspirin: Secondary | ICD-10-CM | POA: Insufficient documentation

## 2014-06-20 DIAGNOSIS — I10 Essential (primary) hypertension: Secondary | ICD-10-CM | POA: Diagnosis not present

## 2014-06-20 DIAGNOSIS — Y92410 Unspecified street and highway as the place of occurrence of the external cause: Secondary | ICD-10-CM | POA: Diagnosis not present

## 2014-06-20 DIAGNOSIS — Z792 Long term (current) use of antibiotics: Secondary | ICD-10-CM | POA: Diagnosis not present

## 2014-06-20 DIAGNOSIS — Y998 Other external cause status: Secondary | ICD-10-CM | POA: Diagnosis not present

## 2014-06-20 DIAGNOSIS — M545 Low back pain: Secondary | ICD-10-CM

## 2014-06-20 DIAGNOSIS — Z791 Long term (current) use of non-steroidal anti-inflammatories (NSAID): Secondary | ICD-10-CM | POA: Insufficient documentation

## 2014-06-20 DIAGNOSIS — S299XXA Unspecified injury of thorax, initial encounter: Secondary | ICD-10-CM | POA: Insufficient documentation

## 2014-06-20 DIAGNOSIS — E119 Type 2 diabetes mellitus without complications: Secondary | ICD-10-CM | POA: Diagnosis not present

## 2014-06-20 DIAGNOSIS — S0990XA Unspecified injury of head, initial encounter: Secondary | ICD-10-CM

## 2014-06-20 DIAGNOSIS — Z8719 Personal history of other diseases of the digestive system: Secondary | ICD-10-CM | POA: Insufficient documentation

## 2014-06-20 DIAGNOSIS — Z79899 Other long term (current) drug therapy: Secondary | ICD-10-CM | POA: Insufficient documentation

## 2014-06-20 DIAGNOSIS — S3992XA Unspecified injury of lower back, initial encounter: Secondary | ICD-10-CM | POA: Insufficient documentation

## 2014-06-20 DIAGNOSIS — Y9389 Activity, other specified: Secondary | ICD-10-CM | POA: Insufficient documentation

## 2014-06-20 DIAGNOSIS — Z7952 Long term (current) use of systemic steroids: Secondary | ICD-10-CM | POA: Diagnosis not present

## 2014-06-20 DIAGNOSIS — R079 Chest pain, unspecified: Secondary | ICD-10-CM

## 2014-06-20 MED ORDER — OXYCODONE-ACETAMINOPHEN 5-325 MG PO TABS
2.0000 | ORAL_TABLET | Freq: Once | ORAL | Status: AC
  Administered 2014-06-20: 2 via ORAL
  Filled 2014-06-20: qty 2
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
## 2014-06-21 LAB — I-STAT CHEM 8, ED
BUN: 17 mg/dL (ref 6–23)
CALCIUM ION: 1.25 mmol/L (ref 1.13–1.30)
CHLORIDE: 101 mmol/L (ref 96–112)
CREATININE: 0.9 mg/dL (ref 0.50–1.35)
Glucose, Bld: 182 mg/dL — ABNORMAL HIGH (ref 70–99)
HCT: 40 % (ref 39.0–52.0)
Hemoglobin: 13.6 g/dL (ref 13.0–17.0)
Potassium: 3.7 mmol/L (ref 3.5–5.1)
Sodium: 141 mmol/L (ref 135–145)
TCO2: 25 mmol/L (ref 0–100)

## 2014-06-21 MED ORDER — IBUPROFEN 600 MG PO TABS: 600.0000 mg | ORAL_TABLET | Freq: Three times a day (TID) | ORAL | Status: DC | PRN
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
## 2015-09-21 ENCOUNTER — Emergency Department (HOSPITAL_COMMUNITY)
Admission: EM | Admit: 2015-09-21 | Discharge: 2015-09-21 | Disposition: A | Discharge status: Home / Self Care | Payer: Self-pay | Attending: Emergency Medicine | Admitting: Emergency Medicine

## 2015-09-21 ENCOUNTER — Encounter (HOSPITAL_COMMUNITY): Payer: Self-pay

## 2015-09-21 ENCOUNTER — Emergency Department (HOSPITAL_COMMUNITY): Payer: Self-pay

## 2015-09-21 DIAGNOSIS — Z79899 Other long term (current) drug therapy: Secondary | ICD-10-CM | POA: Insufficient documentation

## 2015-09-21 DIAGNOSIS — E119 Type 2 diabetes mellitus without complications: Secondary | ICD-10-CM | POA: Insufficient documentation

## 2015-09-21 DIAGNOSIS — Z7984 Long term (current) use of oral hypoglycemic drugs: Secondary | ICD-10-CM | POA: Insufficient documentation

## 2015-09-21 DIAGNOSIS — Z794 Long term (current) use of insulin: Secondary | ICD-10-CM | POA: Insufficient documentation

## 2015-09-21 DIAGNOSIS — I10 Essential (primary) hypertension: Secondary | ICD-10-CM | POA: Insufficient documentation

## 2015-09-21 DIAGNOSIS — Z7982 Long term (current) use of aspirin: Secondary | ICD-10-CM | POA: Insufficient documentation

## 2015-09-21 DIAGNOSIS — M25462 Effusion, left knee: Secondary | ICD-10-CM | POA: Insufficient documentation

## 2015-09-21 MED ORDER — HYDROCODONE-ACETAMINOPHEN 5-325 MG PO TABS: 1.0000 | ORAL_TABLET | Freq: Four times a day (QID) | ORAL | Status: DC | PRN

## 2015-09-21 MED ORDER — IBUPROFEN 600 MG PO TABS: 600.0000 mg | ORAL_TABLET | Freq: Four times a day (QID) | ORAL | Status: DC | PRN

## 2015-09-21 MED ORDER — KETOROLAC TROMETHAMINE 60 MG/2ML IM SOLN
30.0000 mg | Freq: Once | INTRAMUSCULAR | Status: AC
  Administered 2015-09-21: 30 mg via INTRAMUSCULAR
  Filled 2015-09-21: qty 2

## 2015-09-21 NOTE — ED Notes (Signed)
Patient verbalized understanding of discharge instructions and denies any further needs or questions at this time. VS stable. Patient ambulatory with steady gait.  

## 2015-09-21 NOTE — ED Provider Notes (Signed)
History  By signing my name below, I, Carl Clark, attest that this documentation has been prepared under the direction and in the presence of [facility]ese, [place]. Electronically Signed: Earmon PhoenixJennifer Clark, ED Scribe. 09/21/2015. 10:54 PM.  Chief Complaint  Patient presents with  . Knee Pain   The history is provided by the patient and medical records. No language interpreter was used.    HPI Comments:  Carl Clark is a 65 y.o. male who presents to the Emergency Department complaining of left knee pain that began worsening about one week ago. Pt states he fell and injured the knee about one year ago. He reports associated swelling and warmth of the left knee. He states the pain is mostly medial. He has not taken anything for pain. Walking and bearing weight increases the pain. He denies alleviating factors. He denies numbness, tingling or weakness of the LLE. He denies any new trauma, injury or fall.  Past Medical History  Diagnosis Date  . Hypertension   . Type II diabetes mellitus (HCC)   . Family history of anesthesia complication     "never had anesthesia" (07/14/2012)  . Hematochezia     hospitalized 07/13/2012   Past Surgical History  Procedure Laterality Date  . No past surgeries    . Esophagogastroduodenoscopy N/A 07/13/2012    Procedure: ESOPHAGOGASTRODUODENOSCOPY (EGD);  Surgeon: Willis ModenaWilliam Outlaw, MD;  [facility];  Service: Endoscopy;  Laterality: N/A;  pt in ED A8  . Colonoscopy Left 07/14/2012    Procedure: COLONOSCOPY;  Surgeon: Willis ModenaWilliam Outlaw, MD;  [facility];  Service: Endoscopy;  Laterality: Left;   History reviewed. No pertinent family history. Social History  Substance Use Topics  . Smoking status: Never Smoker   . Smokeless tobacco: Never Used  . Alcohol Use: No    Review of Systems  Musculoskeletal: Positive for joint swelling and arthralgias.  Neurological: Negative for weakness and numbness.  All other systems reviewed  and are negative.   Allergies  Review of patient's allergies indicates no known allergies.  Home Medications   Prior to Admission medications   Medication Sig Start Date End Date Taking? Authorizing Provider  aspirin EC 81 MG tablet Take 81 mg by mouth daily.    Historical Provider, MD  Cholecalciferol (VITAMIN D) 2000 UNITS CAPS Take 2,000 Units by mouth daily.    Historical Provider, MD  desonide (DESOWEN) 0.05 % cream Apply topically 2 (two) times daily. Patient not taking: Reported on 05/31/2014 09/08/13   Ethelda ChickKristi M Smith, MD  ferrous fumarate (HEMOCYTE - 106 MG FE) 325 (106 FE) MG TABS Take 1 tablet (106 mg of iron total) by mouth daily. Patient not taking: Reported on 05/31/2014 07/15/12   Vassie Lollarlos Madera, MD  glipiZIDE (GLUCOTROL) 10 MG tablet Take 1 tablet (10 mg total) by mouth 2 (two) times daily before a meal. 05/31/14   Eber HongBrian Miller, MD  HYDROcodone-acetaminophen (NORCO) 5-325 MG tablet Take 1 tablet by mouth every 6 (six) hours as needed. 09/21/15   Hope Orlene OchM Neese, NP  hydrocortisone cream 1 % Apply 1 application topically 2 (two) times daily. Do not apply to face 05/31/14   Eber HongBrian Miller, MD  ibuprofen (ADVIL,MOTRIN) 600 MG tablet Take 1 tablet (600 mg total) by mouth every 6 (six) hours as needed. 09/21/15   Hope Orlene OchM Neese, NP  Insulin Detemir (LEVEMIR) 100 UNIT/ML Pen Inject 20 Units into the skin daily at 10 pm. 05/31/14   Eber HongBrian Miller, MD  Insulin Pen Needle 31G X 5  MM MISC by Does not apply route.    Historical Provider, MD  lisinopril-hydrochlorothiazide (PRINZIDE,ZESTORETIC) 20-12.5 MG per tablet Take 1 tablet by mouth daily. 05/31/14   Eber HongBrian Miller, MD  meloxicam (MOBIC) 15 MG tablet Take 1 tablet (15 mg total) by mouth daily. 05/31/14   Eber HongBrian Miller, MD  metFORMIN (GLUCOPHAGE) 1000 MG tablet Take 1 tablet (1,000 mg total) by mouth 2 (two) times daily with a meal. 05/31/14   Eber HongBrian Miller, MD  pantoprazole (PROTONIX) 40 MG tablet Take 1 tablet (40 mg total) by mouth daily at 12 noon. 05/31/14    Eber HongBrian Miller, MD  traMADol (ULTRAM) 50 MG tablet Take 1-2 tablets (50-100 mg total) by mouth at bedtime as needed for moderate pain. Patient not taking: Reported on 05/31/2014 09/08/13   Ethelda ChickKristi M Smith, MD   Triage Vitals: BP 135/78 mmHg  Pulse 82  Temp(Src) 98.4 F (36.9 C) (Oral)  Resp 16  SpO2 98% Physical Exam  Constitutional: He is oriented to person, place, and time. He appears well-developed and well-nourished.  Eyes: EOM are normal.  Neck: Neck supple.  Cardiovascular:  Distal pulses of LLE 2+. Adequate circulation.  Pulmonary/Chest: Effort normal.  Abdominal: Soft. There is no tenderness.  Musculoskeletal: Normal range of motion.  Left knee with normal patellar movement. Tender over MCL. Joint effusion noted.  Neurological: He is alert and oriented to person, place, and time. No cranial nerve deficit.  Skin: Skin is warm and dry.  Nursing note and vitals reviewed.   ED Course  Procedures (including critical care time) DIAGNOSTIC STUDIES: Oxygen Saturation is 98% on RA, normal by my interpretation.   COORDINATION OF CARE: 10:22 PM- Will have Dr. Clayborne DanaMesner evaluate patient. Pt verbalizes understanding and agrees to plan.  10:40 PM- Dr. Clayborne DanaMesner at bedside.  Knee sleeve, ice, pain management  Medications  ketorolac (TORADOL) injection 30 mg (30 mg Intramuscular Given 09/21/15 2302)    Labs Review Labs Reviewed - No data to display  Imaging Review Dg Knee Complete 4 Views Left  09/21/2015  CLINICAL DATA:  Carl Clark 1 year ago with worsening pain EXAM: LEFT KNEE - COMPLETE 4+ VIEW COMPARISON:  None. FINDINGS: There is a large knee joint effusion. There is a large popliteal fossa cyst containing a 1 cm loose body. 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 No other focal finding. IMPRESSION: Large knee joint effusion and Baker's cyst. 1 cm loose body within the Baker's cyst. Patellofemoral joint worse than lateral  compartment degenerative changes. Electronically Signed   By: Paulina FusiMark  Shogry M.D.   On: 09/21/2015 22:11    MDM  65 y.o. male with left knee pain stable for d/c without fever, no focal neuro deficits and does not appear toxic. Will d;/c home with ibuprofen and hydrocodone and knee sleeve. Patient will f/u with ortho. He will return here as needed for worsening symptoms.   Final diagnoses:  Knee effusion, left    I personally performed the services described in this documentation, which was scribed in my presence. The recorded information has been reviewed and is accurate.     DrydenHope M Neese, NP 09/22/15 1610  Marily MemosJason Mesner, MD 09/23/15 [phone]

## 2015-09-21 NOTE — Discharge Instructions (Signed)
Knee Effusion °Knee effusion means that you have excess fluid in your knee joint. This can cause pain and swelling in your knee. This may make your knee more difficult to bend and move. That is because there is increased pain and pressure in the joint. If there is fluid in your knee, it often means that something is wrong inside your knee, such as severe arthritis, abnormal inflammation, or an infection. Another common cause of knee effusion is an injury to the knee muscles, ligaments, or cartilage. °HOME CARE INSTRUCTIONS °· Use crutches as directed by your health care provider. °· Wear a knee brace as directed by your health care provider. °· Apply ice to the swollen area: °¨ Put ice in a plastic bag. °¨ Place a towel between your skin and the bag. °¨ Leave the ice on for 20 minutes, 2-3 times per day. °· Keep your knee raised (elevated) when you are sitting or lying down. °· Take medicines only as directed by your health care provider. °· Do any rehabilitation or strengthening exercises as directed by your health care provider. °· Rest your knee as directed by your health care provider. You may start doing your normal activities again when your health care provider approves.    °· Keep all follow-up visits as directed by your health care provider. This is important. °SEEK MEDICAL CARE IF: °· You have ongoing (persistent) pain in your knee. °SEEK IMMEDIATE MEDICAL CARE IF: °· You have increased swelling or redness of your knee. °· You have severe pain in your knee. °· You have a fever. °  °This information is not intended to replace advice given to you by your health care provider. Make sure you discuss any questions you have with your health care provider. °  °Document Released: 05/28/2003 Document Revised: 03/28/2014 Document Reviewed: 10/21/2013 °Elsevier Interactive Patient Education ©2016 [employer] ° °

## 2015-09-21 NOTE — ED Notes (Signed)
Pt complaining of L knee pain. Pt states fell on knee a year ago, increased pain today. Pt complaining of swelling, no obvious swelling noted at triage. Pt ambulatory at triage.

## 2015-09-23 NOTE — ED Provider Notes (Signed)
Medical screening examination/treatment/procedure(s) were conducted as a shared visit with non-physician practitioner(s) and myself.  I personally evaluated the patient during the encounter.  65 yo M w/ recurrent l knee pain and swelling. Similar to multiple episodes previously.  On my exam, has mild effusion, no significant warmth, ttp, induration. No pain with ROM. Normal L DP pulse. VS wo fever or tachycardia.  Likely OA vs inflammatory arthritis. do0ubt septic arthritis.  Plan for dc on supportive care adn pcp follow up to ensure improvement.  Marily MemosJason Fathima Bartl, MD 09/23/15 [phone]

## 2015-09-30 ENCOUNTER — Emergency Department (HOSPITAL_COMMUNITY)
Admission: EM | Admit: 2015-09-30 | Discharge: 2015-09-30 | Disposition: A | Discharge status: Home / Self Care | Payer: No Typology Code available for payment source | Attending: Emergency Medicine | Admitting: Emergency Medicine

## 2015-09-30 ENCOUNTER — Encounter (HOSPITAL_COMMUNITY): Payer: Self-pay | Admitting: Nurse Practitioner

## 2015-09-30 DIAGNOSIS — I10 Essential (primary) hypertension: Secondary | ICD-10-CM | POA: Insufficient documentation

## 2015-09-30 DIAGNOSIS — Z7982 Long term (current) use of aspirin: Secondary | ICD-10-CM | POA: Insufficient documentation

## 2015-09-30 DIAGNOSIS — Z794 Long term (current) use of insulin: Secondary | ICD-10-CM | POA: Insufficient documentation

## 2015-09-30 DIAGNOSIS — Z7984 Long term (current) use of oral hypoglycemic drugs: Secondary | ICD-10-CM | POA: Insufficient documentation

## 2015-09-30 DIAGNOSIS — Z79899 Other long term (current) drug therapy: Secondary | ICD-10-CM | POA: Insufficient documentation

## 2015-09-30 DIAGNOSIS — M25562 Pain in left knee: Secondary | ICD-10-CM | POA: Insufficient documentation

## 2015-09-30 DIAGNOSIS — R197 Diarrhea, unspecified: Secondary | ICD-10-CM | POA: Insufficient documentation

## 2015-09-30 DIAGNOSIS — M25561 Pain in right knee: Secondary | ICD-10-CM | POA: Insufficient documentation

## 2015-09-30 DIAGNOSIS — G8929 Other chronic pain: Secondary | ICD-10-CM | POA: Insufficient documentation

## 2015-09-30 DIAGNOSIS — E119 Type 2 diabetes mellitus without complications: Secondary | ICD-10-CM | POA: Insufficient documentation

## 2015-09-30 MED ORDER — LOPERAMIDE HCL 2 MG PO CAPS: 2.0000 mg | ORAL_CAPSULE | Freq: Four times a day (QID) | ORAL | Status: DC | PRN

## 2015-09-30 NOTE — ED Provider Notes (Signed)
CSN: [id number]     Arrival date & time 09/30/15  1504 History  By signing my name below, I, Carl Clark, attest that this documentation has been prepared under the direction and in the presence of Carl Horsemanobert Cass Edinger, PA-C. Electronically Signed: Randell PatientMarrissa Clark, ED Scribe. 09/30/2015. 3:53 PM.    Chief Complaint  Patient presents with  . Knee Pain    The history is provided by the patient. No language interpreter was used.    HPI Comments: Carl Clark is a 65 y.o. male who presents to the Emergency Department complaining of intermittent, mild bilateral knee pain ongoing for the past two years, worse in the past two weeks. Pt states that he has had waxing and waning swelling over the past couple weeks of his bilateral knees that significantly worsened yesterday with associated pain. Per medical records, pt has been multiple times for this complaint, [facility]. Denies weakness.  Pt also complains of diarrhea, most recently last night, that occurs 30 minutes after he eats. He reports associated dry skin to his lips. Denies taking new medications. Denies vomiting, hematochezia, or melena.  Past Medical History  Diagnosis Date  . Hypertension   . Type II diabetes mellitus (HCC)   . Family history of anesthesia complication     "never had anesthesia" (07/14/2012)  . Hematochezia     hospitalized 07/13/2012   Past Surgical History  Procedure Laterality Date  . No past surgeries    . Esophagogastroduodenoscopy N/A 07/13/2012    Procedure: ESOPHAGOGASTRODUODENOSCOPY (EGD);  Surgeon: Willis ModenaWilliam Outlaw, MD;  [facility];  Service: Endoscopy;  Laterality: N/A;  pt in ED A8  . Colonoscopy Left 07/14/2012    Procedure: COLONOSCOPY;  Surgeon: Willis ModenaWilliam Outlaw, MD;  [facility];  Service: Endoscopy;  Laterality: Left;   History reviewed. No pertinent family history. Social History  Substance Use Topics  . Smoking status: Never  Smoker   . Smokeless tobacco: Never Used  . Alcohol Use: No    Review of Systems  Gastrointestinal: Positive for diarrhea. Negative for vomiting and blood in stool.  Musculoskeletal: Positive for joint swelling (bilateral knees) and arthralgias (bilateral knees).  Neurological: Negative for weakness.      Allergies  Review of patient's allergies indicates no known allergies.  Home Medications   Prior to Admission medications   Medication Sig Start Date End Date Taking? Authorizing Provider  aspirin EC 81 MG tablet Take 81 mg by mouth daily.    Historical Provider, MD  Cholecalciferol (VITAMIN D) 2000 UNITS CAPS Take 2,000 Units by mouth daily.    Historical Provider, MD  desonide (DESOWEN) 0.05 % cream Apply topically 2 (two) times daily. Patient not taking: Reported on 05/31/2014 09/08/13   Ethelda ChickKristi M Smith, MD  ferrous fumarate (HEMOCYTE - 106 MG FE) 325 (106 FE) MG TABS Take 1 tablet (106 mg of iron total) by mouth daily. Patient not taking: Reported on 05/31/2014 07/15/12   Vassie Lollarlos Madera, MD  glipiZIDE (GLUCOTROL) 10 MG tablet Take 1 tablet (10 mg total) by mouth 2 (two) times daily before a meal. 05/31/14   Eber HongBrian Miller, MD  HYDROcodone-acetaminophen (NORCO) 5-325 MG tablet Take 1 tablet by mouth every 6 (six) hours as needed. 09/21/15   Hope Orlene OchM Neese, NP  hydrocortisone cream 1 % Apply 1 application topically 2 (two) times daily. Do not apply to face 05/31/14   Eber HongBrian Miller, MD  ibuprofen (ADVIL,MOTRIN) 600 MG tablet Take 1 tablet (600 mg total)  by mouth every 6 (six) hours as needed. 09/21/15   Hope Orlene Och, NP  Insulin Detemir (LEVEMIR) 100 UNIT/ML Pen Inject 20 Units into the skin daily at 10 pm. 05/31/14   Eber Hong, MD  Insulin Pen Needle 31G X 5 MM MISC by Does not apply route.    Historical Provider, MD  lisinopril-hydrochlorothiazide (PRINZIDE,ZESTORETIC) 20-12.5 MG per tablet Take 1 tablet by mouth daily. 05/31/14   Eber Hong, MD  meloxicam (MOBIC) 15 MG tablet Take 1 tablet  (15 mg total) by mouth daily. 05/31/14   Eber Hong, MD  metFORMIN (GLUCOPHAGE) 1000 MG tablet Take 1 tablet (1,000 mg total) by mouth 2 (two) times daily with a meal. 05/31/14   Eber Hong, MD  pantoprazole (PROTONIX) 40 MG tablet Take 1 tablet (40 mg total) by mouth daily at 12 noon. 05/31/14   Eber Hong, MD  traMADol (ULTRAM) 50 MG tablet Take 1-2 tablets (50-100 mg total) by mouth at bedtime as needed for moderate pain. Patient not taking: Reported on 05/31/2014 09/08/13   Ethelda Chick, MD   BP 150/70 mmHg  Pulse 78  Temp(Src) 97.8 F (36.6 C) (Oral)  Resp 18  Ht  (1.651 m)  Wt 150 lb (68.04 kg)  BMI 24.96 kg/m2  SpO2 98% Physical Exam  Constitutional: He is oriented to person, place, and time. He appears well-developed and well-nourished.  HENT:  Head: Normocephalic and atraumatic.  Eyes: Conjunctivae and EOM are normal. Pupils are equal, round, and reactive to light. Right eye exhibits no discharge. Left eye exhibits no discharge. No scleral icterus.  Neck: Normal range of motion. Neck supple. No JVD present.  Cardiovascular: Normal rate, regular rhythm and normal heart sounds.  Exam reveals no gallop and no friction rub.   No murmur heard. Pulmonary/Chest: Effort normal and breath sounds normal. No respiratory distress. He has no wheezes. He has no rales. He exhibits no tenderness.  Abdominal: Soft. He exhibits no distension and no mass. There is no tenderness. There is no rebound and no guarding.  No focal abdominal tenderness, no RLQ tenderness or pain at McBurney's point, no RUQ tenderness or Murphy's sign, no left-sided abdominal tenderness, no fluid wave, or signs of peritonitis   Musculoskeletal: Normal range of motion. He exhibits no edema or tenderness.  ROM and strength of bilateral knees 5/5 No bony abnormality or deformity Patient is ambulatory   Neurological: He is alert and oriented to person, place, and time.  Skin: Skin is warm and dry.   No sign of  infection  Psychiatric: He has a normal mood and affect. His behavior is normal. Judgment and thought content normal.  Nursing note and vitals reviewed.   ED Course  Procedures   DIAGNOSTIC STUDIES: Oxygen Saturation is 98% on RA, normal by my interpretation.    COORDINATION OF CARE: 3:44 PM Will provide pt with a referral to an orthopedist. Advised pt to follow-up with orthopedist. Will discharge pt. Discussed treatment plan with pt at bedside and pt agreed to plan.   MDM   Final diagnoses:  Bilateral chronic knee pain  Diarrhea, unspecified type    Patient with chronic bilateral knee pain.  Arranged with social work to get patient established with PCP through orange card and possible follow-up with specialist.  At discharge patient also notes that he had some diarrhea today.  Non-bloody.  No abdominal pain.  VSS.  Advised increasing fluids and some immodium.  Return for worsening symptoms.  Patient instructed to return  for:  New or worsening symptoms, including, increased abdominal pain, especially pain that localizes to one side, bloody vomit, bloody diarrhea, fever >101, and intractable vomiting.  I personally performed the services described in this documentation, which was scribed in my presence. The recorded information has been reviewed and is accurate.     Carl Horseman, PA-C 09/30/15 1620  Gerhard Munch, MD 10/01/15 [phone]

## 2015-09-30 NOTE — ED Notes (Signed)
Patient able to ambulate independently  

## 2015-09-30 NOTE — Progress Notes (Signed)
Spoke to patient and family at bedside regarding primary care resources and [facility]. Patient in the past was [facility] with [facility] but has not been seen in over a year, orange card has also expired. Follow up appointment made with [facility] for Monday Sept 11,2017 @ 1:45pm, pt verbalized understanding. Orange card application provided, pt instructed to contact me once application is complete for an eligibility appointment to obtain the orange card. My contact information provided and explained. No other Community [facility] & Eligibility Specialist needs identified at this time.  Buddy DutyFelicia Evans [facility] [phone]

## 2015-09-30 NOTE — ED Notes (Addendum)
He c/o 1 year history bilateral knee pain. He was here last week for the pain and given rx for ibuprofen which did not help so he returns. He states he cant sleep due to pain. He also c/o dry chapped skin to lips "for a long time."

## 2015-09-30 NOTE — Discharge Instructions (Signed)
Diarrhea Diarrhea is frequent loose and watery bowel movements. It can cause you to feel weak and dehydrated. Dehydration can cause you to become tired and thirsty, have a dry mouth, and have decreased urination that often is dark yellow. Diarrhea is a sign of another problem, most often an infection that will not last long. In most cases, diarrhea typically lasts 2-3 days. However, it can last longer if it is a sign of something more serious. It is important to treat your diarrhea as directed by your caregiver to lessen or prevent future episodes of diarrhea. CAUSES  Some common causes include:  Gastrointestinal infections caused by viruses, bacteria, or parasites.  Food poisoning or food allergies.  Certain medicines, such as antibiotics, chemotherapy, and laxatives.  Artificial sweeteners and fructose.  Digestive disorders. HOME CARE INSTRUCTIONS  Ensure adequate fluid intake (hydration): Have 1 cup (8 oz) of fluid for each diarrhea episode. Avoid fluids that contain simple sugars or sports drinks, fruit juices, whole milk products, and sodas. Your urine should be clear or pale yellow if you are drinking enough fluids. Hydrate with an oral rehydration solution that you can purchase at pharmacies, retail stores, and online. You can prepare an oral rehydration solution at home by mixing the following ingredients together:   - tsp table salt.   tsp baking soda.   tsp salt substitute containing potassium chloride.  1  tablespoons sugar.  1 L (34 oz) of water.  Certain foods and beverages may increase the speed at which food moves through the gastrointestinal (GI) tract. These foods and beverages should be avoided and include:  Caffeinated and alcoholic beverages.  High-fiber foods, such as raw fruits and vegetables, nuts, seeds, and whole grain breads and cereals.  Foods and beverages sweetened with sugar alcohols, such as xylitol, sorbitol, and mannitol.  Some foods may be well  tolerated and may help thicken stool including:  Starchy foods, such as rice, toast, pasta, low-sugar cereal, oatmeal, grits, baked potatoes, crackers, and bagels.  Bananas.  Applesauce.  Add probiotic-rich foods to help increase healthy bacteria in the GI tract, such as yogurt and fermented milk products.  Wash your hands well after each diarrhea episode.  Only take over-the-counter or prescription medicines as directed by your caregiver.  Take a warm bath to relieve any burning or pain from frequent diarrhea episodes. SEEK IMMEDIATE MEDICAL CARE IF:   You are unable to keep fluids down.  You have persistent vomiting.  You have blood in your stool, or your stools are black and tarry.  You do not urinate in 6-8 hours, or there is only a small amount of very dark urine.  You have abdominal pain that increases or localizes.  You have weakness, dizziness, confusion, or light-headedness.  You have a severe headache.  Your diarrhea gets worse or does not get better.  You have a fever or persistent symptoms for more than 2-3 days.  You have a fever and your symptoms suddenly get worse. MAKE SURE YOU:   Understand these instructions.  Will watch your condition.  Will get help right away if you are not doing well or get worse.   This information is not intended to replace advice given to you by your health care provider. Make sure you discuss any questions you have with your health care provider.   Document Released: 02/25/2002 Document Revised: 03/28/2014 Document Reviewed: 11/13/2011 Elsevier Interactive Patient Education 2016 Elsevier Inc. Knee Pain Knee pain is a very common symptom and can have  many causes. Knee pain often goes away when you follow your health care provider's instructions for relieving pain and discomfort at home. However, knee pain can develop into a condition that needs treatment. Some conditions may include:  Arthritis caused by wear and tear  (osteoarthritis).  Arthritis caused by swelling and irritation (rheumatoid arthritis or gout).  A cyst or growth in your knee.  An infection in your knee joint.  An injury that will not heal.  Damage, swelling, or irritation of the tissues that support your knee (torn ligaments or tendinitis). If your knee pain continues, additional tests may be ordered to diagnose your condition. Tests may include X-rays or other imaging studies of your knee. You may also need to have fluid removed from your knee. Treatment for ongoing knee pain depends on the cause, but treatment may include:  Medicines to relieve pain or swelling.  Steroid injections in your knee.  Physical therapy.  Surgery. HOME CARE INSTRUCTIONS  Take medicines only as directed by your health care provider.  Rest your knee and keep it raised (elevated) while you are resting.  Do not do things that cause or worsen pain.  Avoid high-impact activities or exercises, such as running, jumping rope, or doing jumping jacks.  Apply ice to the knee area:  Put ice in a plastic bag.  Place a towel between your skin and the bag.  Leave the ice on for 20 minutes, 2-3 times a day.  Ask your health care provider if you should wear an elastic knee support.  Keep a pillow under your knee when you sleep.  Lose weight if you are overweight. Extra weight can put pressure on your knee.  Do not use any tobacco products, including cigarettes, chewing tobacco, or electronic cigarettes. If you need help quitting, ask your health care provider. Smoking may slow the healing of any bone and joint problems that you may have. SEEK MEDICAL CARE IF:  Your knee pain continues, changes, or gets worse.  You have a fever along with knee pain.  Your knee buckles or locks up.  Your knee becomes more swollen. SEEK IMMEDIATE MEDICAL CARE IF:   Your knee joint feels hot to the touch.  You have chest pain or trouble breathing.   This  information is not intended to replace advice given to you by your health care provider. Make sure you discuss any questions you have with your health care provider.   Document Released: 01/02/2007 Document Revised: 03/28/2014 Document Reviewed: 10/21/2013 Elsevier Interactive Patient Education [employer]2016 Elsevier Inc.

## 2015-11-30 ENCOUNTER — Ambulatory Visit: Payer: No Typology Code available for payment source | Admitting: Family Medicine

## 2016-09-12 ENCOUNTER — Encounter (HOSPITAL_COMMUNITY): Payer: Self-pay | Admitting: Emergency Medicine

## 2016-09-12 ENCOUNTER — Encounter (HOSPITAL_COMMUNITY)
Admission: EM | Disposition: A | Discharge status: Home / Self Care | Payer: Self-pay | Source: Home / Self Care | Attending: Cardiovascular Disease

## 2016-09-12 ENCOUNTER — Encounter (HOSPITAL_COMMUNITY): Payer: No Typology Code available for payment source

## 2016-09-12 ENCOUNTER — Emergency Department (HOSPITAL_COMMUNITY): Payer: BLUE CROSS/BLUE SHIELD

## 2016-09-12 ENCOUNTER — Inpatient Hospital Stay (HOSPITAL_COMMUNITY)
Admission: EM | Admit: 2016-09-12 | Discharge: 2016-09-23 | DRG: 234 | Disposition: A | Discharge status: Home / Self Care | Payer: BLUE CROSS/BLUE SHIELD | Attending: Cardiovascular Disease | Admitting: Cardiovascular Disease

## 2016-09-12 ENCOUNTER — Other Ambulatory Visit: Payer: Self-pay | Admitting: *Deleted

## 2016-09-12 DIAGNOSIS — D62 Acute posthemorrhagic anemia: Secondary | ICD-10-CM | POA: Diagnosis not present

## 2016-09-12 DIAGNOSIS — I214 Non-ST elevation (NSTEMI) myocardial infarction: Principal | ICD-10-CM

## 2016-09-12 DIAGNOSIS — Z7982 Long term (current) use of aspirin: Secondary | ICD-10-CM | POA: Diagnosis not present

## 2016-09-12 DIAGNOSIS — I252 Old myocardial infarction: Secondary | ICD-10-CM | POA: Diagnosis present

## 2016-09-12 DIAGNOSIS — Z7984 Long term (current) use of oral hypoglycemic drugs: Secondary | ICD-10-CM

## 2016-09-12 DIAGNOSIS — Z833 Family history of diabetes mellitus: Secondary | ICD-10-CM | POA: Diagnosis not present

## 2016-09-12 DIAGNOSIS — Z01810 Encounter for preprocedural cardiovascular examination: Secondary | ICD-10-CM | POA: Diagnosis not present

## 2016-09-12 DIAGNOSIS — Z951 Presence of aortocoronary bypass graft: Secondary | ICD-10-CM

## 2016-09-12 DIAGNOSIS — I2510 Atherosclerotic heart disease of native coronary artery without angina pectoris: Secondary | ICD-10-CM

## 2016-09-12 DIAGNOSIS — I25110 Atherosclerotic heart disease of native coronary artery with unstable angina pectoris: Secondary | ICD-10-CM | POA: Diagnosis present

## 2016-09-12 DIAGNOSIS — E785 Hyperlipidemia, unspecified: Secondary | ICD-10-CM | POA: Diagnosis present

## 2016-09-12 DIAGNOSIS — Z794 Long term (current) use of insulin: Secondary | ICD-10-CM

## 2016-09-12 DIAGNOSIS — I5030 Unspecified diastolic (congestive) heart failure: Secondary | ICD-10-CM | POA: Diagnosis not present

## 2016-09-12 DIAGNOSIS — K5900 Constipation, unspecified: Secondary | ICD-10-CM | POA: Diagnosis present

## 2016-09-12 DIAGNOSIS — I10 Essential (primary) hypertension: Secondary | ICD-10-CM | POA: Diagnosis present

## 2016-09-12 DIAGNOSIS — E8770 Fluid overload, unspecified: Secondary | ICD-10-CM | POA: Diagnosis present

## 2016-09-12 DIAGNOSIS — Z9689 Presence of other specified functional implants: Secondary | ICD-10-CM

## 2016-09-12 DIAGNOSIS — Z79899 Other long term (current) drug therapy: Secondary | ICD-10-CM | POA: Diagnosis not present

## 2016-09-12 DIAGNOSIS — E119 Type 2 diabetes mellitus without complications: Secondary | ICD-10-CM | POA: Diagnosis present

## 2016-09-12 DIAGNOSIS — E782 Mixed hyperlipidemia: Secondary | ICD-10-CM | POA: Diagnosis not present

## 2016-09-12 DIAGNOSIS — I25119 Atherosclerotic heart disease of native coronary artery with unspecified angina pectoris: Secondary | ICD-10-CM | POA: Diagnosis not present

## 2016-09-12 DIAGNOSIS — Z9889 Other specified postprocedural states: Secondary | ICD-10-CM

## 2016-09-12 DIAGNOSIS — Z23 Encounter for immunization: Secondary | ICD-10-CM | POA: Diagnosis not present

## 2016-09-12 DIAGNOSIS — Z09 Encounter for follow-up examination after completed treatment for conditions other than malignant neoplasm: Secondary | ICD-10-CM

## 2016-09-12 DIAGNOSIS — I25118 Atherosclerotic heart disease of native coronary artery with other forms of angina pectoris: Secondary | ICD-10-CM

## 2016-09-12 HISTORY — PX: LEFT HEART CATH AND CORONARY ANGIOGRAPHY: CATH118249

## 2016-09-12 HISTORY — DX: Personal history of other diseases of the digestive system: Z87.19

## 2016-09-12 HISTORY — DX: Old myocardial infarction: I25.2

## 2016-09-12 LAB — I-STAT CHEM 8, ED
BUN: 35 mg/dL — AB (ref 6–20)
CHLORIDE: 97 mmol/L — AB (ref 101–111)
Calcium, Ion: 1.13 mmol/L — ABNORMAL LOW (ref 1.15–1.40)
Creatinine, Ser: 0.9 mg/dL (ref 0.61–1.24)
GLUCOSE: 355 mg/dL — AB (ref 65–99)
HCT: 38 % — ABNORMAL LOW (ref 39.0–52.0)
Hemoglobin: 12.9 g/dL — ABNORMAL LOW (ref 13.0–17.0)
Potassium: 3.7 mmol/L (ref 3.5–5.1)
SODIUM: 135 mmol/L (ref 135–145)
TCO2: 30 mmol/L (ref 0–100)

## 2016-09-12 LAB — BASIC METABOLIC PANEL
Anion gap: 10 (ref 5–15)
BUN: 30 mg/dL — ABNORMAL HIGH (ref 6–20)
CO2: 25 mmol/L (ref 22–32)
Calcium: 9.2 mg/dL (ref 8.9–10.3)
Chloride: 98 mmol/L — ABNORMAL LOW (ref 101–111)
Creatinine, Ser: 1.05 mg/dL (ref 0.61–1.24)
GFR calc Af Amer: >60 mL/min (ref >60)
GFR calc non Af Amer: >60 mL/min (ref >60)
GLUCOSE: 337 mg/dL — AB (ref 65–99)
Potassium: 3.6 mmol/L (ref 3.5–5.1)
Sodium: 133 mmol/L — ABNORMAL LOW (ref 135–145)

## 2016-09-12 LAB — CBC
HCT: 36.1 % — ABNORMAL LOW (ref 39.0–52.0)
Hemoglobin: 12.5 g/dL — ABNORMAL LOW (ref 13.0–17.0)
MCH: 32.7 pg (ref 26.0–34.0)
MCHC: 34.6 g/dL (ref 30.0–36.0)
MCV: 94.5 fL (ref 78.0–100.0)
PLATELETS: 329 10*3/uL (ref 150–400)
RBC: 3.82 MIL/uL — ABNORMAL LOW (ref 4.22–5.81)
RDW: 12.3 % (ref 11.5–15.5)
WBC: 6.3 10*3/uL (ref 4.0–10.5)

## 2016-09-12 LAB — LIPID PANEL
CHOLESTEROL: 192 mg/dL (ref 0–200)
HDL: 40 mg/dL — AB (ref >40)
LDL Cholesterol: 123 mg/dL — ABNORMAL HIGH (ref 0–99)
TRIGLYCERIDES: 147 mg/dL (ref <150)
Total CHOL/HDL Ratio: 4.8 RATIO
VLDL: 29 mg/dL (ref 0–40)

## 2016-09-12 LAB — HEPARIN LEVEL (UNFRACTIONATED): HEPARIN UNFRACTIONATED: 0.47 [IU]/mL (ref 0.30–0.70)

## 2016-09-12 LAB — GLUCOSE, CAPILLARY
GLUCOSE-CAPILLARY: 146 mg/dL — AB (ref 65–99)
GLUCOSE-CAPILLARY: 225 mg/dL — AB (ref 65–99)
Glucose-Capillary: 254 mg/dL — ABNORMAL HIGH (ref 65–99)

## 2016-09-12 LAB — TROPONIN I: Troponin I: 1.54 ng/mL (ref <0.03)

## 2016-09-12 LAB — I-STAT TROPONIN, ED: Troponin i, poc: 2.14 ng/mL (ref 0.00–0.08)

## 2016-09-12 LAB — PROTIME-INR
INR: 1.06
Prothrombin Time: 13.8 seconds (ref 11.4–15.2)

## 2016-09-12 LAB — TSH: TSH: 1.064 u[IU]/mL (ref 0.350–4.500)

## 2016-09-12 LAB — APTT: aPTT: 26 seconds (ref 24–36)

## 2016-09-12 SURGERY — LEFT HEART CATH AND CORONARY ANGIOGRAPHY
Anesthesia: LOCAL

## 2016-09-12 MED ORDER — FENTANYL CITRATE (PF) 100 MCG/2ML IJ SOLN
INTRAMUSCULAR | Status: DC | PRN
  Administered 2016-09-12: 25 ug via INTRAVENOUS

## 2016-09-12 MED ORDER — SODIUM CHLORIDE 0.9 % IV SOLN: 250.0000 mL | INTRAVENOUS | Status: DC | PRN

## 2016-09-12 MED ORDER — SODIUM CHLORIDE 0.9 % WEIGHT BASED INFUSION
3.0000 mL/kg/h | INTRAVENOUS | Status: AC
  Administered 2016-09-12: 3 mL/kg/h via INTRAVENOUS

## 2016-09-12 MED ORDER — VERAPAMIL HCL 2.5 MG/ML IV SOLN
INTRAVENOUS | Status: AC
  Filled 2016-09-12: qty 2

## 2016-09-12 MED ORDER — SODIUM CHLORIDE 0.9 % WEIGHT BASED INFUSION: 3.0000 mL/kg/h | INTRAVENOUS | Status: DC

## 2016-09-12 MED ORDER — INSULIN ASPART 100 UNIT/ML ~~LOC~~ SOLN: 0.0000 [IU] | Freq: Three times a day (TID) | SUBCUTANEOUS | Status: DC

## 2016-09-12 MED ORDER — LISINOPRIL 20 MG PO TABS
20.0000 mg | ORAL_TABLET | Freq: Every day | ORAL | Status: DC
  Administered 2016-09-12 – 2016-09-14 (×3): 20 mg via ORAL
  Filled 2016-09-12 (×3): qty 2

## 2016-09-12 MED ORDER — HEPARIN BOLUS VIA INFUSION
4000.0000 [IU] | Freq: Once | INTRAVENOUS | Status: AC
  Administered 2016-09-12: 4000 [IU] via INTRAVENOUS
  Filled 2016-09-12: qty 4000

## 2016-09-12 MED ORDER — ASPIRIN 81 MG PO CHEW
324.0000 mg | CHEWABLE_TABLET | Freq: Once | ORAL | Status: AC
  Administered 2016-09-12: 324 mg via ORAL
  Filled 2016-09-12: qty 4

## 2016-09-12 MED ORDER — SODIUM CHLORIDE 0.9 % WEIGHT BASED INFUSION
1.0000 mL/kg/h | INTRAVENOUS | Status: AC
  Administered 2016-09-12: 1 mL/kg/h via INTRAVENOUS

## 2016-09-12 MED ORDER — SODIUM CHLORIDE 0.9 % WEIGHT BASED INFUSION: 1.0000 mL/kg/h | INTRAVENOUS | Status: DC

## 2016-09-12 MED ORDER — SODIUM CHLORIDE 0.9% FLUSH: 3.0000 mL | INTRAVENOUS | Status: DC | PRN

## 2016-09-12 MED ORDER — HEPARIN (PORCINE) IN NACL 100-0.45 UNIT/ML-% IJ SOLN
1000.0000 [IU]/h | INTRAMUSCULAR | Status: DC
  Administered 2016-09-12: 800 [IU]/h via INTRAVENOUS
  Administered 2016-09-13 – 2016-09-14 (×2): 1000 [IU]/h via INTRAVENOUS
  Filled 2016-09-12 (×2): qty 250

## 2016-09-12 MED ORDER — ACETAMINOPHEN 325 MG PO TABS: 650.0000 mg | ORAL_TABLET | ORAL | Status: DC | PRN

## 2016-09-12 MED ORDER — LIDOCAINE HCL 1 % IJ SOLN
INTRAMUSCULAR | Status: AC
  Filled 2016-09-12: qty 20

## 2016-09-12 MED ORDER — SODIUM CHLORIDE 0.9% FLUSH
3.0000 mL | Freq: Two times a day (BID) | INTRAVENOUS | Status: DC
  Administered 2016-09-13: 3 mL via INTRAVENOUS

## 2016-09-12 MED ORDER — ONDANSETRON HCL 4 MG/2ML IJ SOLN: 4.0000 mg | Freq: Four times a day (QID) | INTRAMUSCULAR | Status: DC | PRN

## 2016-09-12 MED ORDER — HEPARIN SODIUM (PORCINE) 1000 UNIT/ML IJ SOLN
INTRAMUSCULAR | Status: DC | PRN
  Administered 2016-09-12: 4000 [IU] via INTRAVENOUS

## 2016-09-12 MED ORDER — SODIUM CHLORIDE 0.9% FLUSH
3.0000 mL | Freq: Two times a day (BID) | INTRAVENOUS | Status: DC
  Administered 2016-09-12: 3 mL via INTRAVENOUS

## 2016-09-12 MED ORDER — HEPARIN SODIUM (PORCINE) 1000 UNIT/ML IJ SOLN
INTRAMUSCULAR | Status: AC
  Filled 2016-09-12: qty 1

## 2016-09-12 MED ORDER — HYDROCHLOROTHIAZIDE 12.5 MG PO CAPS
12.5000 mg | ORAL_CAPSULE | Freq: Every day | ORAL | Status: DC
  Administered 2016-09-12 – 2016-09-14 (×3): 12.5 mg via ORAL
  Filled 2016-09-12 (×3): qty 1

## 2016-09-12 MED ORDER — IOPAMIDOL (ISOVUE-370) INJECTION 76%
INTRAVENOUS | Status: DC | PRN
  Administered 2016-09-12: 55 mL via INTRA_ARTERIAL

## 2016-09-12 MED ORDER — HEPARIN (PORCINE) IN NACL 100-0.45 UNIT/ML-% IJ SOLN
800.0000 [IU]/h | INTRAMUSCULAR | Status: DC
  Administered 2016-09-12: 800 [IU]/h via INTRAVENOUS
  Filled 2016-09-12: qty 250

## 2016-09-12 MED ORDER — LIDOCAINE HCL (PF) 1 % IJ SOLN
INTRAMUSCULAR | Status: DC | PRN
Start: 2016-09-12 — End: 2016-09-12
  Administered 2016-09-12: 2 mL

## 2016-09-12 MED ORDER — MIDAZOLAM HCL 2 MG/2ML IJ SOLN
INTRAMUSCULAR | Status: DC | PRN
  Administered 2016-09-12: 2 mg via INTRAVENOUS

## 2016-09-12 MED ORDER — LISINOPRIL-HYDROCHLOROTHIAZIDE 20-12.5 MG PO TABS: 1.0000 | ORAL_TABLET | Freq: Every day | ORAL | Status: DC

## 2016-09-12 MED ORDER — MIDAZOLAM HCL 2 MG/2ML IJ SOLN
INTRAMUSCULAR | Status: AC
  Filled 2016-09-12: qty 2

## 2016-09-12 MED ORDER — ATORVASTATIN CALCIUM 80 MG PO TABS
80.0000 mg | ORAL_TABLET | Freq: Every day | ORAL | Status: DC
  Administered 2016-09-12 – 2016-09-22 (×10): 80 mg via ORAL
  Filled 2016-09-12 (×10): qty 1

## 2016-09-12 MED ORDER — ASPIRIN EC 81 MG PO TBEC
81.0000 mg | DELAYED_RELEASE_TABLET | Freq: Every day | ORAL | Status: DC
  Administered 2016-09-13 – 2016-09-14 (×2): 81 mg via ORAL
  Filled 2016-09-12 (×2): qty 1

## 2016-09-12 MED ORDER — HEPARIN (PORCINE) IN NACL 2-0.9 UNIT/ML-% IJ SOLN
INTRAMUSCULAR | Status: AC
  Filled 2016-09-12: qty 1000

## 2016-09-12 MED ORDER — HEPARIN (PORCINE) IN NACL 2-0.9 UNIT/ML-% IJ SOLN
INTRAMUSCULAR | Status: AC | PRN
  Administered 2016-09-12: 1000 mL

## 2016-09-12 MED ORDER — INSULIN ASPART 100 UNIT/ML ~~LOC~~ SOLN
0.0000 [IU] | Freq: Three times a day (TID) | SUBCUTANEOUS | Status: DC
  Administered 2016-09-12: 5 [IU] via SUBCUTANEOUS
  Administered 2016-09-12: 1 [IU] via SUBCUTANEOUS
  Administered 2016-09-13: 3 [IU] via SUBCUTANEOUS
  Administered 2016-09-13: 9 [IU] via SUBCUTANEOUS
  Administered 2016-09-13: 1 [IU] via SUBCUTANEOUS
  Administered 2016-09-14: 3 [IU] via SUBCUTANEOUS
  Administered 2016-09-14: 7 [IU] via SUBCUTANEOUS

## 2016-09-12 MED ORDER — PNEUMOCOCCAL VAC POLYVALENT 25 MCG/0.5ML IJ INJ: 0.5000 mL | INJECTION | INTRAMUSCULAR | Status: DC

## 2016-09-12 MED ORDER — FENTANYL CITRATE (PF) 100 MCG/2ML IJ SOLN
INTRAMUSCULAR | Status: AC
  Filled 2016-09-12: qty 2

## 2016-09-12 MED ORDER — IOPAMIDOL (ISOVUE-370) INJECTION 76%
INTRAVENOUS | Status: AC
  Filled 2016-09-12: qty 100

## 2016-09-12 MED ORDER — VERAPAMIL HCL 2.5 MG/ML IV SOLN
INTRAVENOUS | Status: DC | PRN
  Administered 2016-09-12: 10 mL via INTRA_ARTERIAL

## 2016-09-12 MED ORDER — NITROGLYCERIN 0.4 MG SL SUBL: 0.4000 mg | SUBLINGUAL_TABLET | SUBLINGUAL | Status: DC | PRN

## 2016-09-12 SURGICAL SUPPLY — 13 items
BAG SNAP BAND KOVER 36X36 (MISCELLANEOUS) ×1 IMPLANT
CATH INFINITI 5 FR JL3.5 (CATHETERS) ×1 IMPLANT
CATH INFINITI 5FR ANG PIGTAIL (CATHETERS) ×1 IMPLANT
CATH INFINITI JR4 5F (CATHETERS) ×1 IMPLANT
DEVICE RAD COMP TR BAND LRG (VASCULAR PRODUCTS) ×1 IMPLANT
GLIDESHEATH SLEND SS 6F .021 (SHEATH) ×1 IMPLANT
GUIDEWIRE INQWIRE 1.5J.035X260 (WIRE) IMPLANT
INQWIRE 1.5J .035X260CM (WIRE) ×2
KIT HEART LEFT (KITS) ×2 IMPLANT
PACK CARDIAC CATHETERIZATION (CUSTOM PROCEDURE TRAY) ×2 IMPLANT
SYR MEDRAD MARK V 150ML (SYRINGE) ×2 IMPLANT
TRANSDUCER W/STOPCOCK (MISCELLANEOUS) ×2 IMPLANT
TUBING CIL FLEX 10 FLL-RA (TUBING) ×2 IMPLANT

## 2016-09-12 NOTE — Progress Notes (Signed)
Arrived to unit in stable condition with heparin gtt infusing @ 838ml/hr.  VSS.  Interpreter at bedside for translating.  Denies any CP or discomfort since 5am.  Encouraged via use of translator to notify nursing staff of any CP or discomfort.  Oriented to room and unit with understanding verbalized.  Will continue to monitor.

## 2016-09-12 NOTE — ED Provider Notes (Signed)
[facility]-EMERGENCY DEPT Provider Note   CSN: [id number] Arrival date & time: 09/12/16  0602     History   Chief Complaint Chief Complaint  Patient presents with  . Chest Pain   LEVEL 5 CAVEAT DUE TO ACUITY OF CONDITION  HPI Carl Clark is a 66 y.o. male.  The history is provided by the patient. A language interpreter was used ([id number]).  Chest Pain   This is a new problem. The current episode started 1 to 2 hours ago. The problem has been gradually improving. The pain is severe. The quality of the pain is described as sharp. The pain does not radiate. Associated symptoms include diaphoresis and shortness of breath. Risk factors include being elderly and male gender.  His past medical history is significant for diabetes and hypertension.  Patient with h/o Diabetes/hypertension presents with CP He reports episodes of CP tonight that cause SOB/diaphoresis He also mentions cramping in his fingers/hand He has had up to 3 episodes in past 2 hrs, and mentions he had CP 2 days ago He is currently chest pain free No known previous h/o MI  Past Medical History:  Diagnosis Date  . Family history of anesthesia complication    "never had anesthesia" (07/14/2012)  . Hematochezia    hospitalized 07/13/2012  . Hypertension   . Type II diabetes mellitus [facility])     Patient Active Problem List   Diagnosis Date Noted  . Rectal bleeding 07/13/2012  . Type II or unspecified type diabetes mellitus with unspecified complication, uncontrolled 07/13/2012  . Unspecified essential hypertension 07/13/2012    Past Surgical History:  Procedure Laterality Date  . COLONOSCOPY Left 07/14/2012   Procedure: COLONOSCOPY;  Surgeon: Willis Modena, MD;  Location: [facility] ENDOSCOPY;  Service: Endoscopy;  Laterality: Left;  . ESOPHAGOGASTRODUODENOSCOPY N/A 07/13/2012   Procedure: ESOPHAGOGASTRODUODENOSCOPY (EGD);  Surgeon: Willis Modena, MD;  Location: [facility] ENDOSCOPY;  Service: Endoscopy;  Laterality: N/A;   pt in ED A8  . NO PAST SURGERIES         Home Medications    Prior to Admission medications   Medication Sig Start Date End Date Taking? Authorizing Provider  aspirin EC 81 MG tablet Take 81 mg by mouth daily.    [provider]  Cholecalciferol (VITAMIN D) 2000 UNITS CAPS Take 2,000 Units by mouth daily.    [provider]  desonide (DESOWEN) 0.05 % cream Apply topically 2 (two) times daily. Patient not taking: Reported on 05/31/2014 09/08/13   Ethelda Chick, MD  ferrous fumarate (HEMOCYTE - 106 MG FE) 325 (106 FE) MG TABS Take 1 tablet (106 mg of iron total) by mouth daily. Patient not taking: Reported on 05/31/2014 07/15/12   Vassie Loll, MD  glipiZIDE (GLUCOTROL) 10 MG tablet Take 1 tablet (10 mg total) by mouth 2 (two) times daily before a meal. 05/31/14   Eber Hong, MD  HYDROcodone-acetaminophen (NORCO) 5-325 MG tablet Take 1 tablet by mouth every 6 (six) hours as needed. 09/21/15   Janne Napoleon, NP  hydrocortisone cream 1 % Apply 1 application topically 2 (two) times daily. Do not apply to face 05/31/14   Eber Hong, MD  ibuprofen (ADVIL,MOTRIN) 600 MG tablet Take 1 tablet (600 mg total) by mouth every 6 (six) hours as needed. 09/21/15   Janne Napoleon, NP  Insulin Detemir (LEVEMIR) 100 UNIT/ML Pen Inject 20 Units into the skin daily at 10 pm. 05/31/14   Eber Hong, MD  Insulin Pen Needle 31G X 5 MM MISC  by Does not apply route.    [provider]  lisinopril-hydrochlorothiazide (PRINZIDE,ZESTORETIC) 20-12.5 MG per tablet Take 1 tablet by mouth daily. 05/31/14   Eber HongMiller, Brian, MD  loperamide (IMODIUM) 2 MG capsule Take 1 capsule (2 mg total) by mouth 4 (four) times daily as needed for diarrhea or loose stools. 09/30/15   Roxy HorsemanBrowning, Robert, PA-C  meloxicam (MOBIC) 15 MG tablet Take 1 tablet (15 mg total) by mouth daily. 05/31/14   Eber HongMiller, Brian, MD  metFORMIN (GLUCOPHAGE) 1000 MG tablet Take 1 tablet (1,000 mg total) by mouth 2 (two) times daily with a meal.  05/31/14   Eber HongMiller, Brian, MD  pantoprazole (PROTONIX) 40 MG tablet Take 1 tablet (40 mg total) by mouth daily at 12 noon. 05/31/14   Eber HongMiller, Brian, MD  traMADol (ULTRAM) 50 MG tablet Take 1-2 tablets (50-100 mg total) by mouth at bedtime as needed for moderate pain. Patient not taking: Reported on 05/31/2014 09/08/13   Ethelda ChickSmith, Kristi M, MD    Family History No family history on file.  Social History Social History  Substance Use Topics  . Smoking status: Never Smoker  . Smokeless tobacco: Never Used  . Alcohol use No     Allergies   Patient has no known allergies.   Review of Systems Review of Systems  Unable to perform ROS: Acuity of condition  Constitutional: Positive for diaphoresis.  Respiratory: Positive for shortness of breath.   Cardiovascular: Positive for chest pain.  Gastrointestinal: Negative for blood in stool.     Physical Exam Updated Vital Signs BP 137/71   Pulse 71   Temp 98.3 F (36.8 C)   Resp 16   SpO2 98%   Physical Exam CONSTITUTIONAL: Elderly, no acute distress HEAD: Normocephalic/atraumatic EYES: EOMI/PERRL ENMT: Mucous membranes moist NECK: supple no meningeal signs SPINE/BACK:entire spine nontender CV: S1/S2 noted, no murmurs/rubs/gallops noted LUNGS: Lungs are clear to auscultation bilaterally, no apparent distress ABDOMEN: soft, nontender  GU:no cva tenderness NEURO: Pt is awake/alert/appropriate, moves all extremitiesx4.  No facial droop.   EXTREMITIES: pulses normal/equal, full ROM SKIN: warm, color normal PSYCH: no abnormalities of mood noted, alert and oriented to situation  ED Treatments / Results  Labs (all labs ordered are listed, but only abnormal results are displayed) Labs Reviewed  BASIC METABOLIC PANEL - Abnormal; Notable for the following:       Result Value   Sodium 133 (*)    Chloride 98 (*)    Glucose, Bld 337 (*)    BUN 30 (*)    All other components within normal limits  CBC - Abnormal; Notable for the  following:    RBC 3.82 (*)    Hemoglobin 12.5 (*)    HCT 36.1 (*)    All other components within normal limits  I-STAT TROPOININ, ED - Abnormal; Notable for the following:    Troponin i, poc 2.14 (*)    All other components within normal limits  I-STAT CHEM 8, ED - Abnormal; Notable for the following:    Chloride 97 (*)    BUN 35 (*)    Glucose, Bld 355 (*)    Calcium, Ion 1.13 (*)    Hemoglobin 12.9 (*)    HCT 38.0 (*)    All other components within normal limits  APTT  PROTIME-INR  TROPONIN I    EKG  EKG Interpretation  Date/Time:  Monday September 12 2016 06:10:54 EDT Ventricular Rate:  74 PR Interval:    QRS Duration: 111 QT Interval:  392  QTC Calculation: 435 R Axis:   84 Text Interpretation:  Sinus rhythm Borderline short PR interval Borderline right axis deviation Abnrm T, consider ischemia, anterolateral lds No previous ECGs available Confirmed by Zadie Rhine ([id number]) on 09/12/2016 6:31:48 AM       Radiology Dg Chest Port 1 View  Result Date: 09/12/2016 CLINICAL DATA:  Recurrent chest pain for 2 days, diaphoresis and shortness of breath. History of hypertension and diabetes. EXAM: PORTABLE CHEST 1 VIEW COMPARISON:  Chest radiograph June 20, 2014 FINDINGS: Cardiomediastinal silhouette is normal. No pleural effusions or focal consolidations. Trachea projects midline and there is no pneumothorax. Soft tissue planes and included osseous structures are non-suspicious. IMPRESSION: Stable examination:  No acute cardiopulmonary process. Electronically Signed   By: Awilda Metro M.D.   On: 09/12/2016 06:46    Procedures Procedures  CRITICAL CARE Performed by: Joya Gaskins Total critical care time: 35 minutes Critical care time was exclusive of separately billable procedures and treating other patients. Critical care was necessary to treat or prevent imminent or life-threatening deterioration. Critical care was time spent personally by me on the following  activities: development of treatment plan with patient and/or surrogate as well as nursing, discussions with consultants, evaluation of patient's response to treatment, examination of patient, obtaining history from patient or surrogate, ordering and performing treatments and interventions, ordering and review of laboratory studies, ordering and review of radiographic studies, pulse oximetry and re-evaluation of patient's condition. PATIENT WITH NON-STEMI, WILL NEED ADMISSION AND CARDIAC CATH   Medications Ordered in ED Medications  aspirin chewable tablet 324 mg (324 mg Oral Given 09/12/16 6045)     Initial Impression / Assessment and Plan / ED Course  I have reviewed the triage vital signs and the nursing notes.  Pertinent labs & imaging results that were available during my care of the patient were reviewed by me and considered in my medical decision making (see chart for details).     6:46 AM Pt presents with multiple episodes of CP tonight He is currently CP free He has abnormal EKG Troponin >2 I have consulted cardiology (current PA on call) who will see patient ASA ordered Will follow closely 7:11 AM Cardiology here to see patient He will likely need admission/cardiac cath  Final Clinical Impressions(s) / ED Diagnoses   Final diagnoses:  Non-STEMI (non-ST elevated myocardial infarction) [facility])    New Prescriptions New Prescriptions   No medications on file     Zadie Rhine, MD 09/12/16 [phone]

## 2016-09-12 NOTE — Progress Notes (Signed)
ANTICOAGULATION CONSULT NOTE - Initial Consult  Pharmacy Consult for heparin Indication: chest pain/ACS  Patient Measurements: Heparin Dosing Weight: 66.2 kg  Vital Signs: Temp: 97.9 F (36.6 C) (06/25 1552) Temp Source: Oral (06/25 1552) BP: 137/62 (06/25 1552) Pulse Rate: 67 (06/25 1552)  Labs:  Recent Labs  09/12/16 0621 09/12/16 [id number] 09/12/16 1315  HGB 12.5* 12.9*  --   HCT 36.1* 38.0*  --   PLT 329  --   --   APTT 26  --   --   LABPROT 13.8  --   --   INR 1.06  --   --   HEPARINUNFRC  --   --  0.47  CREATININE 1.05 0.90  --   TROPONINI 1.54*  --   --     Estimated Creatinine Clearance: 71.2 mL/min (by C-G formula based on SCr of 0.9 mg/dL).  Assessment: [age] yo male with chest pain Now s/p cath with need for TCTS eval for [facility]  CBC stable  TR band removal to being around 1600/1630 To resume hep 2 hrs after   Goal of Therapy:  Heparin level 0.3-0.7 units/ml Monitor platelets by anticoagulation protocol: Yes    Plan:  -Heparin 800 units/hr to begin at 1900 -0200 HL -Daily HL, CBC -F/U plans for open-heart  Isaac BlissMichael Tahsin Benyo, PharmD, BCPS, BCCCP Clinical Pharmacist 09/12/2016 3:57 PM

## 2016-09-12 NOTE — Progress Notes (Signed)
ANTICOAGULATION CONSULT NOTE - Initial Consult  Pharmacy Consult for heparin Indication: chest pain/ACS  Patient Measurements: Heparin Dosing Weight: 66.2 kg  Vital Signs: Temp: 98.3 F (36.8 C) (06/25 0611) BP: 137/71 (06/25 0630) Pulse Rate: 71 (06/25 0630)  Labs:  Recent Labs  09/12/16 0621 09/12/16 0638  HGB 12.5* 12.9*  HCT 36.1* 38.0*  PLT 329  --   APTT 26  --   LABPROT 13.8  --   INR 1.06  --   CREATININE 1.05 0.90  TROPONINI 1.54*  --     CrCl cannot be calculated (Unknown ideal weight.).   Medical History: Past Medical History:  Diagnosis Date  . Family history of anesthesia complication    "never had anesthesia" (07/14/2012)  . Hematochezia    hospitalized 07/13/2012  . Hypertension   . Type II diabetes mellitus (HCC)     Assessment: 66 yo male with chest pain Troponin 2.1 Starting heparin gtt SCr 0.9, hgb 12.9, plts wnl   Goal of Therapy:  Heparin level 0.3-0.7 units/ml Monitor platelets by anticoagulation protocol: Yes    Plan:  -Heparin bolus 4000 units x1 then 800 units/hr -Daily HL, CBC -Level this afternoon -F/u plans for cardiac cath   Carl Clark, Carl Clark 09/12/2016,7:37 AM

## 2016-09-12 NOTE — ED Triage Notes (Signed)
[employer] interpreter was used for triage

## 2016-09-12 NOTE — Progress Notes (Addendum)
Remains CP free.  Off unit to cath lab with this RN present.  Translator present at bedside.  Heparin gtt stopped per order prior to departure.

## 2016-09-12 NOTE — Consult Note (Signed)
[address]       [place] [address]             [phone]        [facility] Medical Record #[id number] Date of Birth: 1951-01-03  Referring: No ref. provider found Primary Care: Hildred Laser, MD  Chief Complaint:    Chief Complaint  Patient presents with  . Chest Pain    History of Present Illness:     Patient is an Arabic speaker was admitted through the emergency room with several days of episodic chest pain,  He was at [facility] this am with a primary care MD and was told to come to the ER, which he did. He had cardiac cath today by Dr Excell Seltzer. Pt has history of DM. On insulin . He had noted these symptoms on and off for 7-2 hours. He drove himself to the emergency room. He denies any previous history of stroke or myocardial infarction. 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   Current Activity/ Functional Status: Patient is independent with mobility/ambulation, transfers, ADL's, IADL's.   Zubrod Score: At the time of surgery this patient's most appropriate activity status/level should be described as: []     0    Normal activity, no symptoms [x]     1    Restricted in physical strenuous activity but ambulatory, able to do out light work []     2    Ambulatory and capable of self care, unable to do work activities, up and about                 more than 50%  Of the time                            []     3    Only limited self care, in bed greater than 50% of waking hours []     4    Completely disabled, no self care, confined to bed or chair []     5    Moribund  Past Medical History:  Diagnosis Date  . Family history of anesthesia complication    "never had anesthesia" (07/14/2012)  . Hematochezia    hospitalized 07/13/2012  . History of rectal bleeding    secondary to diverticulum affecting right hepatic  flexure - 06/2012  . Hypertension   . Type II diabetes mellitus (HCC)     Past Surgical History:  Procedure Laterality Date  . COLONOSCOPY Left 07/14/2012   Procedure: COLONOSCOPY;  Surgeon: Willis Modena, MD;  Location: [facility] ENDOSCOPY;  Service: Endoscopy;  Laterality: Left;  . ESOPHAGOGASTRODUODENOSCOPY N/A 07/13/2012   Procedure: ESOPHAGOGASTRODUODENOSCOPY (EGD);  Surgeon: Willis Modena, MD;  Location: [facility] ENDOSCOPY;  Service: Endoscopy;  Laterality: N/A;  pt in ED A8  . NO PAST SURGERIES      History  Smoking Status  . Never Smoker  Smokeless Tobacco  . Never Used    History  Alcohol Use No    Social History   Social History  . Marital status: Widowed    Spouse name: N/A  . Number of children: N/A  . Years of education: N/A   Occupational History  . Not on file.   Social History Main Topics  . Smoking status: Never Smoker  . Smokeless tobacco: Never  Used  . Alcohol use No  . Drug use: No  . Sexual activity: Not Currently   Other Topics Concern  . Not on file   Social History Narrative  . No narrative on file    No Known Allergies  Current Facility-Administered Medications  Medication Dose Route Frequency Provider Last Rate Last Dose  . [START ON 09/13/2016] 0.9 %  sodium chloride infusion  250 mL Intravenous PRN Tonny Bollman, MD      . 0.9% sodium chloride infusion  1 mL/kg/hr Intravenous Continuous Tonny Bollman, MD      . acetaminophen (TYLENOL) tablet 650 mg  650 mg Oral Q4H PRN [name], PA      . [START ON 09/13/2016] aspirin EC tablet 81 mg  81 mg Oral Daily Bhagat, Bhavinkumar, PA      . atorvastatin (LIPITOR) tablet 80 mg  80 mg Oral q1800 Bhagat, Bhavinkumar, PA      . heparin ADULT infusion 100 units/mL (25000 units/258mL sodium chloride 0.45%)  800 Units/hr Intravenous Continuous Bertram Millard, RPH      . lisinopril (PRINIVIL,ZESTRIL) tablet 20 mg  20 mg Oral Daily Tonny Bollman, MD   20 mg at 09/12/16 1345   And  .  hydrochlorothiazide (MICROZIDE) capsule 12.5 mg  12.5 mg Oral Daily Tonny Bollman, MD      . insulin aspart (novoLOG) injection 0-9 Units  0-9 Units Subcutaneous TID WC Hammons, Kimberly B, RPH   5 Units at 09/12/16 1351  . nitroGLYCERIN (NITROSTAT) SL tablet 0.4 mg  0.4 mg Sublingual Q5 Min x 3 PRN Bhagat, Bhavinkumar, PA      . ondansetron (ZOFRAN) injection 4 mg  4 mg Intravenous Q6H PRN Bhagat, Bhavinkumar, PA      . [START ON 09/13/2016] pneumococcal 23 valent vaccine (PNU-IMMUNE) injection 0.5 mL  0.5 mL Intramuscular Tomorrow-1000 Tonny Bollman, MD      . Melene Muller ON 09/13/2016] sodium chloride flush (NS) 0.9 % injection 3 mL  3 mL Intravenous Q12H Tonny Bollman, MD      . Melene Muller ON 09/13/2016] sodium chloride flush (NS) 0.9 % injection 3 mL  3 mL Intravenous PRN Tonny Bollman, MD        Prescriptions Prior to Admission  Medication Sig Dispense Refill Last Dose  . glipiZIDE (GLUCOTROL) 10 MG tablet Take 1 tablet (10 mg total) by mouth 2 (two) times daily before a meal. 30 tablet 2 09/11/2016 at Unknown time  . Insulin Detemir (LEVEMIR) 100 UNIT/ML Pen Inject 20 Units into the skin daily at 10 pm. 15 mL 2 09/11/2016 at Unknown time  . lisinopril-hydrochlorothiazide (PRINZIDE,ZESTORETIC) 20-12.5 MG per tablet Take 1 tablet by mouth daily. 30 tablet 1 Past Week at Unknown time  . metFORMIN (GLUCOPHAGE) 1000 MG tablet Take 1 tablet (1,000 mg total) by mouth 2 (two) times daily with a meal. 30 tablet 1 09/12/2016 at Unknown time  . simvastatin (ZOCOR) 40 MG tablet Take 40 mg by mouth daily.   Past Week at Unknown time    Family History  Problem Relation Age of Onset  . Diabetes Mother   . Diabetes Father      Review of Systems:      Cardiac Review of Systems: Y or N  Chest Pain [  y  ]  Resting SOB [ n  ] Exertional SOB  [ n ]  Orthopnea [ n ]   Pedal Edema [   n]    Palpitations [ n ] Syncope  [n  ]  Presyncope [ n  ]  General Review of Systems: [Y] = yes [  ]=no Constitional:  recent weight change [  n]; anorexia [  ]; fatigue [  ]; nausea [  ]; night sweats [  ]; fever [  ]; or chills [  ]                                                               Dental: poor dentition[  ]; Last Dentist visit:   Eye : blurred vision [  n]; diplopia [   ]; vision changes [ n ];  Amaurosis fugax[  n]; Resp: cough [  ];  wheezing[ n ];  hemoptysis[n  ]; shortness of breath[ n ]; paroxysmal nocturnal dyspnea[  n]; dyspnea on exertion[  ]; or orthopnea[  ];  GI:  gallstones[  ], vomiting[  ];  dysphagia[  ]; melena[  ];  hematochezia [  ]; heartburn[  ];   Hx of  Colonoscopy[y  ]; GU: kidney stones [  ]; hematuria[ n ];   dysuria [  ];  nocturia[  ];  history of     obstruction [  ]; urinary frequency [  ]             Skin: rash, swelling[  ];, hair loss[  ];  peripheral edema[  ];  or itching[  ]; Musculosketetal: myalgias[  ];  joint swelling[  ];  joint erythema[  ];  joint pain[  ];  back pain[  ];  Heme/Lymph: bruising[  ];  bleeding[  ];  anemia[  ];  Neuro: TIA[  ];  headaches[  ];  stroke[  ];  vertigo[  ];  seizures[  ];   paresthesias[  ];  difficulty walking[  ];  Psych:depression[  ]; anxiety[  ];  Endocrine: diabetes[ y ];  thyroid dysfunction[  ];  Immunizations: Flu [ n ]; Pneumococcal[n  ];  Other:  Physical Exam: BP 138/70 (BP Location: Left Arm)   Pulse 60   Temp 97.9 F (36.6 C) (Oral)   Resp 18   Ht 5\' 5"  (1.651 m)   Wt 136 lb 4.8 oz (61.8 kg)   SpO2 99%   BMI 22.68 kg/m    General appearance: alert, cooperative and appears older than stated age Head: Normocephalic, without obvious abnormality, atraumatic Neck: no adenopathy, no carotid bruit, no JVD, supple, symmetrical, trachea midline and thyroid not enlarged, symmetric, no tenderness/mass/nodules Lymph nodes: Cervical, supraclavicular, and axillary nodes normal. Resp: clear to auscultation bilaterally Back: symmetric, no curvature. ROM normal. No CVA tenderness. Cardio: regular rate and rhythm,  S1, S2 normal, no murmur, click, rub or gallop GI: soft, non-tender; bowel sounds normal; no masses,  no organomegaly Extremities: extremities normal, atraumatic, no cyanosis or edema and Homans sign is negative, no sign of DVT Neurologic: Grossly normal +1 DP and PT pulses bilaterally  Diagnostic Studies & Laboratory data:     Recent Radiology Findings:   Dg Chest Port 1 View  Result Date: 09/12/2016 CLINICAL DATA:  Recurrent chest pain for 2 days, diaphoresis and shortness of breath. History of hypertension and diabetes. EXAM: PORTABLE CHEST 1 VIEW COMPARISON:  Chest radiograph June 20, 2014 FINDINGS: Cardiomediastinal silhouette is normal. No pleural effusions or focal consolidations. Trachea projects  midline and there is no pneumothorax. Soft tissue planes and included osseous structures are non-suspicious. IMPRESSION: Stable examination:  No acute cardiopulmonary process. Electronically Signed   By: Awilda Metroourtnay  Bloomer M.D.   On: 09/12/2016 06:46     I have independently reviewed the above radiologic studies.  Recent Lab Findings: Lab Results  Component Value Date   WBC 6.3 09/12/2016   HGB 12.9 (L) 09/12/2016   HCT 38.0 (L) 09/12/2016   PLT 329 09/12/2016   GLUCOSE 355 (H) 09/12/2016   CHOL 192 09/12/2016   TRIG 147 09/12/2016   HDL 40 (L) 09/12/2016   LDLCALC 123 (H) 09/12/2016   ALT 8 07/13/2012   AST 11 07/13/2012   NA 135 09/12/2016   K 3.7 09/12/2016   CL 97 (L) 09/12/2016   CREATININE 0.90 09/12/2016   BUN 35 (H) 09/12/2016   CO2 25 09/12/2016   TSH 1.064 09/12/2016   INR 1.06 09/12/2016   HGBA1C 9.2 (H) 10/12/2009   CATH: Procedures   Left Heart Cath and Coronary Angiography  Conclusion   1. Severe proximal LAD stenosis  2. Severe proximal LCx stenosis 3. Mild-moderate RCA stenosis 4. Preserved LV systolic function with normal LVEF (>65%) and normal LVEDP  Pt essentially has left main equivalent with severe proximal/ostial LAD and LCx stenoses. Also has  diffusely disease circumflex, OM, and diagonal branches. Recommend TCTS consult for CABG in setting of diabetes and severe multivessel CAD. Resume heparin post-cath.  Indications   Non-ST elevation (NSTEMI) myocardial infarction (HCC) [I21.4 (ICD-10-CM)]  Procedural Details/Technique   Technical Details INDICATION: NSTEMI  PROCEDURAL DETAILS: The right wrist was prepped, draped, and anesthetized with 1% lidocaine. Using the modified Seldinger technique, a 5/6 French Slender sheath was introduced into the right radial artery. 3 mg of verapamil was administered through the sheath, weight-based unfractionated heparin was administered intravenously. Standard Judkins catheters were used for selective coronary angiography and left ventriculography. Catheter exchanges were performed over an exchange length guidewire. There were no immediate procedural complications. A TR band was used for radial hemostasis at the completion of the procedure. The patient was transferred to the post catheterization recovery area for further monitoring.    Estimated blood loss <50 mL.  During this procedure the patient was administered the following to achieve and maintain moderate conscious sedation: Versed 2 mg, Fentanyl 25 mcg, while the patient's heart rate, blood pressure, and oxygen saturation were continuously monitored. The period of conscious sedation was 22 minutes, of which I was present face-to-face 100% of this time.    Coronary Findings   Dominance: Right  Left Main  Vessel is angiographically normal.  Left Anterior Descending  The mid and distal LAD appear to be suitable targets for grafting  Prox LAD lesion, 95% stenosed. The LAD is diseased back to the ostium. there is a critical stenosis approximately 8-10 mm beyond the left main in the proximal LAD  First Diagonal Branch  Vessel is small in size.  Ost 1st Diag to 1st Diag lesion, 95% stenosed. small, severely diseased diagonal branch  Left  Circumflex  Ost Cx lesion, 90% stenosed. The lesion is concentric.  Prox Cx to Mid Cx lesion, 60% stenosed.  Mid Cx lesion, 80% stenosed.  First Obtuse Marginal Branch  Patent first OM divides into twin branches  Second Obtuse Marginal Branch  Vessel is small in size.  Ost 2nd Mrg to 2nd Mrg lesion, 99% stenosed. small severely diseased vessel  Right Coronary Artery  Prox RCA lesion, 40% stenosed. The  lesion is eccentric.  Mid RCA lesion, 50% stenosed.  Wall Motion              Left Heart   Left Ventricle The left ventricular size is normal. The left ventricular systolic function is normal. LV end diastolic pressure is normal. The left ventricular ejection fraction is greater than 65% by visual estimate. No regional wall motion abnormalities.    Coronary Diagrams   Diagnostic Diagram          Echo pending    Assessment / Plan:      #1 non-STEMI, positive troponin with symptoms of unstable angina. Patient cardiac catheterization performed she confirms significant three-vessel coronary artery disease with preserved LV function I discussed with him through the interpreter the risks and options of coronary artery bypass grafting for the treatment of his multivessel disease with symptoms. Coronary artery bypass grafting has been recommended to him because of the critical nature of his coronary obstructions, his diabetes, and the anatomy is not suitable for angioplasty. We will obtain preoperative vein mapping and echocardiogram. I discussed with him tentatively proceeding with surgery on June 28 Thursday. #2 diabetes mellitus with complications of coronary artery disease with poor control hemoglobin A1c is 9.1 #3 hypertension   I  spent 40 minutes counseling the patient face to face and 50% or more the  time was spent in counseling and coordination of care. The total time spent in the appointment was 60 minutes.    Delight Ovens MD      [address] [place] [address] Office [phone]   Beeper [phone]  09/12/2016 4:50 PM

## 2016-09-12 NOTE — Progress Notes (Signed)
Dr. Tyrone SageGerhardt at bedside along with translator discussing POC.

## 2016-09-12 NOTE — ED Notes (Signed)
Attempted report 

## 2016-09-12 NOTE — Progress Notes (Signed)
Returned from cath lab in stable condition. Right wrist level 0 with TR band in place. Palpable right radial pulse.  Denies any CP or discomfort upon return.  Translator remains at bedside.  Pt with numerous questions per translator.  Will notify MD.  Will continue to monitor.

## 2016-09-12 NOTE — ED Notes (Signed)
Consulting provider at bedside

## 2016-09-12 NOTE — Interval H&P Note (Signed)
Cath Lab Visit (complete for each Cath Lab visit)  Clinical Evaluation Leading to the Procedure:   ACS: Yes.    Non-ACS:    Anginal Classification: CCS IV  Anti-ischemic medical therapy: No Therapy  Non-Invasive Test Results: No non-invasive testing performed  Prior CABG: No previous CABG      History and Physical Interval Note:  09/12/2016 2:36 PM  Carl Clark  has presented today for surgery, with the diagnosis of nstemi  The various methods of treatment have been discussed with the patient and family. After consideration of risks, benefits and other options for treatment, the patient has consented to  Procedure(s): Left Heart Cath and Coronary Angiography (N/A) as a surgical intervention .  The patient's history has been reviewed, patient examined, no change in status, stable for surgery.  I have reviewed the patient's chart and labs.  Questions were answered to the patient's satisfaction.     Tonny Bollmanooper, Crystina Borrayo

## 2016-09-12 NOTE — ED Notes (Signed)
Called lab for add on labs

## 2016-09-12 NOTE — H&P (Signed)
Cardiology Admission History and Physical:   Patient ID: Carl Clark; [id number]; November 16, 1950   Admission date: 09/12/2016  Primary Care Provider: Hildred Laser, MD Primary Cardiologist: New to Dr. Excell Seltzer  Patient Profile:   Carl Clark is a 66 y.o. male with a history of Hypertension and hyperlipidemia presented to [facility] ER by himself for evaluation of chest pain.  History of Present Illness:   Carl Clark speaks [place] language. Language interpreter [phone] used. Patient has being having intermittent substernal chest pain with diaphoresis and shortness of breath for the past 72 hours lasting for approximately 20 minutes each time. He woke up from sleep around 4 AM 6/25  with a worse episode 10/10. He drove himself to ER. Felt like going to pass out. He is chest pain-free currently. He also had numbness in his left arm. 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  He denies prior history of stroke or  MI. Non-smoker and alcohol drinker. Denies family history of CAD.  In ER, point-of-care troponin 2.14. Troponin I 1.54. Glucose over 300. Kidney function normal. Chest x-ray without acute cardiopulmonary disease. EKG shows sinus rhythm with non specific ST changes - personally reviewed. No prior EKG to compare.   Past Medical History:  Diagnosis Date  . Family history of anesthesia complication    "never had anesthesia" (07/14/2012)  . Hematochezia    hospitalized 07/13/2012  . History of rectal bleeding    secondary to diverticulum affecting right hepatic flexure - 06/2012  . Hypertension   . Type II diabetes mellitus (HCC)     Past Surgical History:  Procedure Laterality Date  . COLONOSCOPY Left 07/14/2012   Procedure: COLONOSCOPY;  Surgeon: Willis Modena, MD;  Location: [facility] ENDOSCOPY;  Service: Endoscopy;  Laterality: Left;  . ESOPHAGOGASTRODUODENOSCOPY N/A  07/13/2012   Procedure: ESOPHAGOGASTRODUODENOSCOPY (EGD);  Surgeon: Willis Modena, MD;  Location: [facility] ENDOSCOPY;  Service: Endoscopy;  Laterality: N/A;  pt in ED A8  . NO PAST SURGERIES       Medications Prior to Admission: Prior to Admission medications   Medication Sig Start Date End Date Taking? Authorizing Provider  aspirin EC 81 MG tablet Take 81 mg by mouth daily.    [provider]  Cholecalciferol (VITAMIN D) 2000 UNITS CAPS Take 2,000 Units by mouth daily.    [provider]  desonide (DESOWEN) 0.05 % cream Apply topically 2 (two) times daily. Patient not taking: Reported on 05/31/2014 09/08/13   Ethelda Chick, MD  ferrous fumarate (HEMOCYTE - 106 MG FE) 325 (106 FE) MG TABS Take 1 tablet (106 mg of iron total) by mouth daily. Patient not taking: Reported on 05/31/2014 07/15/12   Vassie Loll, MD  glipiZIDE (GLUCOTROL) 10 MG tablet Take 1 tablet (10 mg total) by mouth 2 (two) times daily before a meal. 05/31/14   Eber Hong, MD  HYDROcodone-acetaminophen (NORCO) 5-325 MG tablet Take 1 tablet by mouth every 6 (six) hours as needed. 09/21/15   Janne Napoleon, NP  hydrocortisone cream 1 % Apply 1 application topically 2 (two) times daily. Do not apply to face 05/31/14   Eber Hong, MD  ibuprofen (ADVIL,MOTRIN) 600 MG tablet Take 1 tablet (600 mg total) by mouth every 6 (six) hours as needed. 09/21/15   Janne Napoleon, NP  Insulin Detemir (LEVEMIR) 100 UNIT/ML Pen Inject 20 Units into the skin daily at 10 pm. 05/31/14  Eber Hong, MD  Insulin Pen Needle 31G X 5 MM MISC by Does not apply route.    [provider]  lisinopril-hydrochlorothiazide (PRINZIDE,ZESTORETIC) 20-12.5 MG per tablet Take 1 tablet by mouth daily. 05/31/14   Eber Hong, MD  loperamide (IMODIUM) 2 MG capsule Take 1 capsule (2 mg total) by mouth 4 (four) times daily as needed for diarrhea or loose stools. 09/30/15   Roxy Horseman, PA-C  meloxicam (MOBIC) 15 MG tablet Take 1 tablet (15 mg  total) by mouth daily. 05/31/14   Eber Hong, MD  metFORMIN (GLUCOPHAGE) 1000 MG tablet Take 1 tablet (1,000 mg total) by mouth 2 (two) times daily with a meal. 05/31/14   Eber Hong, MD  pantoprazole (PROTONIX) 40 MG tablet Take 1 tablet (40 mg total) by mouth daily at 12 noon. 05/31/14   Eber Hong, MD  traMADol (ULTRAM) 50 MG tablet Take 1-2 tablets (50-100 mg total) by mouth at bedtime as needed for moderate pain. Patient not taking: Reported on 05/31/2014 09/08/13   Ethelda Chick, MD     Allergies:   No Known Allergies  Social History:   Social History   Social History  . Marital status: Widowed    Spouse name: N/A  . Number of children: N/A  . Years of education: N/A   Occupational History  . Not on file.   Social History Main Topics  . Smoking status: Never Smoker  . Smokeless tobacco: Never Used  . Alcohol use No  . Drug use: No  . Sexual activity: Not Currently   Other Topics Concern  . Not on file   Social History Narrative  . No narrative on file    Family History:  The patient's family history includes Diabetes in his father and mother.    ROS:  Please see the history of present illness. All other ROS reviewed and negative.     Physical Exam/Data:   Vitals:   09/12/16 0611 09/12/16 0615 09/12/16 0630 09/12/16 0700  BP: (!) 141/75 (!) 144/85 137/71   Pulse: 80 77 71   Resp: 20 16    Temp: 98.3 F (36.8 C)     SpO2: 98% 95% 98%   Weight:    146 lb (66.2 kg)  Height:    5\' 5"  (1.651 m)   No intake or output data in the 24 hours ending 09/12/16 0747 Filed Weights   09/12/16 0700  Weight: 146 lb (66.2 kg)   Body mass index is 24.3 kg/m.  General:  Well nourished, well developed, in no acute distress HEENT: normal Lymph: no adenopathy Neck: no JVD Endocrine:  No thryomegaly Vascular: No carotid bruits; FA pulses 2+ bilaterally without bruits  Cardiac:  normal S1, S2; RRR; no murmur Lungs:  clear to auscultation bilaterally, no wheezing,  rhonchi or rales  Abd: soft, nontender, no hepatomegaly  Ext: no  edema Musculoskeletal:  No deformities, BUE and BLE strength normal and equal Skin: warm and dry  Neuro:  CNs 2-12 intact, no focal abnormalities noted Psych:  Normal affect    Laboratory Data:  Chemistry  Recent Labs Lab 09/12/16 0621 09/12/16 0638  NA 133* 135  K 3.6 3.7  CL 98* 97*  CO2 25  --   GLUCOSE 337* 355*  BUN 30* 35*  CREATININE 1.05 0.90  CALCIUM 9.2  --   GFRNONAA >60  --   GFRAA >60  --   ANIONGAP 10  --     No results for input(s): PROT,  ALBUMIN, AST, ALT, ALKPHOS, BILITOT in the last 168 hours. Hematology  Recent Labs Lab 09/12/16 0621 09/12/16 0638  WBC 6.3  --   RBC 3.82*  --   HGB 12.5* 12.9*  HCT 36.1* 38.0*  MCV 94.5  --   MCH 32.7  --   MCHC 34.6  --   RDW 12.3  --   PLT 329  --    Cardiac Enzymes  Recent Labs Lab 09/12/16 0621  TROPONINI 1.54*     Recent Labs Lab 09/12/16 0628  TROPIPOC 2.14*    BNPNo results for input(s): BNP, PROBNP in the last 168 hours.  DDimer No results for input(s): DDIMER in the last 168 hours.  Radiology/Studies:  Dg Chest Port 1 View  Result Date: 09/12/2016 CLINICAL DATA:  Recurrent chest pain for 2 days, diaphoresis and shortness of breath. History of hypertension and diabetes. EXAM: PORTABLE CHEST 1 VIEW COMPARISON:  Chest radiograph June 20, 2014 FINDINGS: Cardiomediastinal silhouette is normal. No pleural effusions or focal consolidations. Trachea projects midline and there is no pneumothorax. Soft tissue planes and included osseous structures are non-suspicious. IMPRESSION: Stable examination:  No acute cardiopulmonary process. Electronically Signed   By: Awilda Metro M.D.   On: 09/12/2016 06:46    Assessment and Plan:   1. NSTEMI  - Presented with symptoms of unstable angina. Symptoms ongoing for the past 72 hours. Worsened this morning with waking up at 4 AM from sleep. Currently chest pain-free. - EKG with non  specific ST changes.  Point-of-care troponin 2.14. Troponin I 1.54. - Will admit him. Cycle troponin. Start IV heparin. For cath today.  The patient understands that risks include but are not limited to stroke (1 in 1000), death (1 in 1000), kidney failure [usually temporary] (1 in 500), bleeding (1 in 200), allergic reaction [possibly serious] (1 in 200), and agrees to proceed.   - Check A1c (blood sugar high), lipid panel and TSH  2. DM - Start SSI  3. HTN - Resume home meds  Severity of Illness: The appropriate patient status for this patient is INPATIENT. Inpatient status is judged to be reasonable and necessary in order to provide the required intensity of service to ensure the patient's safety. The patient's presenting symptoms, physical exam findings, and initial radiographic and laboratory data in the context of their chronic comorbidities is felt to place them at high risk for further clinical deterioration. Furthermore, it is not anticipated that the patient will be medically stable for discharge from the hospital within 2 midnights of admission. The following factors support the patient status of inpatient.   " The patient's presenting symptoms include NSTEMI. " The worrisome physical exam findings include: Unstable angina " The initial radiographic and laboratory data are worrisome because of elevated troponin and sysptoms " The chronic co-morbidities include DM and HTN    * I certify that at the point of admission it is my clinical judgment that the patient will require inpatient hospital care spanning beyond 2 midnights from the point of admission due to high intensity of service, high risk for further deterioration and high frequency of surveillance required.*    Signed, [name], PA -C 09/12/2016 7:47 AM   Patient seen, examined. Available data reviewed. Agree with findings, assessment, and plan as outlined by Chelsea Aus, PA-C. The patient is independently  interviewed and examined. This is done with the assistance of an interpreter use through video conferencing. The patient has had intermittent severe chest pain over the last 72  hours. Details of his history are described above. On my exam, he is alert and oriented, in no distress. JVP is normal. Carotid upstrokes are normal with no bruits. Lungs are clear to auscultation bilaterally. Heart is regular rate and rhythm with no murmur or gallop. Abdomen is soft and nontender. Back is midline with no flank tenderness. Extremities show no edema. Skin is warm and dry with no rash. Peripheral pulses are intact and equal.  The patient has evidence of non-ST elevation infarction with EKG changes possibly suggestive of anterior ischemia with biphasic anterior T waves. Troponin is elevated. His primary risk factor is diabetes. Cardiac catheterization and possible PCI are clearly indicated. I have reviewed the risks, indications, and alternatives to cardiac catheterization, possible angioplasty, and stenting with the patient. Risks include but are not limited to bleeding, infection, vascular injury, stroke, myocardial infection, arrhythmia, kidney injury, radiation-related injury in the case of prolonged fluoroscopy use, emergency cardiac surgery, and death. The patient understands the risks of serious complication is 1-2 in 1000 with diagnostic cardiac cath and 1-2% or less with angioplasty/stenting.   In the interim the patient will be treated with IV heparin. He has received aspirin. Will start a high-intensity statin drug and a beta blocker as tolerated. Hold metformin and use sliding scale insulin.  Tonny BollmanMichael Zong Mcquarrie, M.D. 09/12/2016 8:40 AM

## 2016-09-12 NOTE — ED Triage Notes (Signed)
Pt states two days ago he had chest pain that happened when he was driving and again yesterday. He states he has cramping at the end of his left hand fingers. Pt has had 3 episodes of the pain in the past 2 hours. He states that he has had diaphoresis and shob.

## 2016-09-13 ENCOUNTER — Inpatient Hospital Stay (HOSPITAL_COMMUNITY): Payer: BLUE CROSS/BLUE SHIELD

## 2016-09-13 ENCOUNTER — Encounter (HOSPITAL_COMMUNITY): Payer: Self-pay | Admitting: Cardiovascular Disease

## 2016-09-13 DIAGNOSIS — Z01810 Encounter for preprocedural cardiovascular examination: Secondary | ICD-10-CM

## 2016-09-13 LAB — GLUCOSE, CAPILLARY
GLUCOSE-CAPILLARY: 145 mg/dL — AB (ref 65–99)
GLUCOSE-CAPILLARY: 202 mg/dL — AB (ref 65–99)
GLUCOSE-CAPILLARY: 235 mg/dL — AB (ref 65–99)
Glucose-Capillary: 366 mg/dL — ABNORMAL HIGH (ref 65–99)

## 2016-09-13 LAB — VAS US DOPPLER PRE CABG
LEFT ECA DIAS: -12 cm/s
LEFT VERTEBRAL DIAS: 19 cm/s
Left CCA dist dias: -22 cm/s
Left CCA dist sys: -82 cm/s
Left CCA prox dias: 26 cm/s
Left CCA prox sys: 106 cm/s
Left ICA dist dias: -24 cm/s
Left ICA dist sys: -87 cm/s
Left ICA prox dias: -21 cm/s
Left ICA prox sys: -71 cm/s
RIGHT ECA DIAS: -16 cm/s
RIGHT VERTEBRAL DIAS: 17 cm/s
Right CCA prox dias: 19 cm/s
Right CCA prox sys: 102 cm/s
Right cca dist sys: -97 cm/s

## 2016-09-13 LAB — BASIC METABOLIC PANEL
ANION GAP: 4 — AB (ref 5–15)
BUN: 13 mg/dL (ref 6–20)
CO2: 28 mmol/L (ref 22–32)
Calcium: 9.2 mg/dL (ref 8.9–10.3)
Chloride: 105 mmol/L (ref 101–111)
Creatinine, Ser: 0.77 mg/dL (ref 0.61–1.24)
GFR calc Af Amer: >60 mL/min (ref >60)
GFR calc non Af Amer: >60 mL/min (ref >60)
GLUCOSE: 131 mg/dL — AB (ref 65–99)
Potassium: 3.6 mmol/L (ref 3.5–5.1)
Sodium: 137 mmol/L (ref 135–145)

## 2016-09-13 LAB — CBC
HEMATOCRIT: 38.1 % — AB (ref 39.0–52.0)
Hemoglobin: 13 g/dL (ref 13.0–17.0)
MCH: 31.6 pg (ref 26.0–34.0)
MCHC: 34.1 g/dL (ref 30.0–36.0)
MCV: 92.5 fL (ref 78.0–100.0)
Platelets: 320 10*3/uL (ref 150–400)
RBC: 4.12 MIL/uL — AB (ref 4.22–5.81)
RDW: 12.1 % (ref 11.5–15.5)
WBC: 4.4 10*3/uL (ref 4.0–10.5)

## 2016-09-13 LAB — HEMOGLOBIN A1C
HEMOGLOBIN A1C: 9.6 % — AB (ref 4.8–5.6)
Mean Plasma Glucose: 229 mg/dL

## 2016-09-13 LAB — PULMONARY FUNCTION TEST
FEF 25-75 Pre: 1.36 L/sec
FEF2575-%Pred-Pre: 60 %
FEV1-%Pred-Pre: 77 %
FEV1-Pre: 2.18 L
FEV1FVC-%Pred-Pre: 91 %
FEV6-%Pred-Pre: 87 %
FEV6-Pre: 3.12 L
FEV6FVC-%Pred-Pre: 104 %
FVC-%Pred-Pre: 83 %
FVC-Pre: 3.17 L
Pre FEV1/FVC ratio: 69 %
Pre FEV6/FVC Ratio: 98 %

## 2016-09-13 LAB — HEPARIN LEVEL (UNFRACTIONATED)
HEPARIN UNFRACTIONATED: 0.47 [IU]/mL (ref 0.30–0.70)
Heparin Unfractionated: 0.11 IU/mL — ABNORMAL LOW (ref 0.30–0.70)

## 2016-09-13 LAB — HIV ANTIBODY (ROUTINE TESTING W REFLEX): HIV SCREEN 4TH GENERATION: NONREACTIVE

## 2016-09-13 NOTE — Progress Notes (Signed)
Pre-op Cardiac Surgery  Carotid Findings:  Bilateral: No significant (1-39%) ICA stenosis. Antegrade vertebral flow.     Upper Extremity Right Left  Brachial Pressures 124 118  Radial Waveforms Tri Tri  Ulnar Waveforms Tri Tri  Palmar Arch (Allen's Test) Normal  Normal      Lower  Extremity Right Left      Anterior Tibial 142, Tri 141,Tri  Posterior Tibial 149, Tri 152, Tri  Ankle/Brachial Indices 1.2 1.23    Farrel DemarkJill Eunice, RDMS, RVT 09/13/2016

## 2016-09-13 NOTE — Progress Notes (Signed)
CARDIAC REHAB PHASE I   PRE:  Rate/Rhythm: 67 SR  BP:  Sitting: 121/60        SaO2: 100 RA  MODE:  Ambulation: 550 ft   POST:  Rate/Rhythm: 73 SR  BP:  Sitting: 138/71         SaO2: 100 RA  Pt speaks minimal English, friend at beside, able to provide basic interpretation, however, pt states preference is to have bedside interpreter for cardiac surgery pre-op education. RN aware. Pt agreeable to walk, ambulated 550 ft on RA, IV, assist x1, steady gait, tolerated well with no complaints. Will hope to have interpreter for tomorrow to complete pre-op education. Pt to bed per pt request after walk, call bell within reach. Will follow.    [phone] Carl GrapesEmily C Giana Castner, RN, BSN 09/13/2016 1:48 PM

## 2016-09-13 NOTE — Progress Notes (Signed)
Lower Extremity Vein Map    Right Great Saphenous Vein   Segment Diameter Comment  1. Origin 3.2549mm   2. High Thigh 1.10887mm branch  3. Mid Thigh 2.237mm   4. Low Thigh 1.5392mm   5. At Knee 1.337mm   6. High Calf 1.5314mm   7. Low Calf 1.7569mm   8. Ankle mm    mm    mm    mm     Left Great Saphenous Vein   Segment Diameter Comment  1. Origin 3.3315mm   2. High Thigh 2.4715mm   3. Mid Thigh 1.1698mm branch  4. Low Thigh 2.5906mm   5. At Knee 1.2042mm   6. High Calf 1.5755mm   7. Low Calf 1.1941mm   8. Ankle mm    mm    mm    mm

## 2016-09-13 NOTE — Care Management Note (Signed)
Case Management Note Carl PieriniKristi Idan Prime RN, BSN Unit 2W-Case Manager [phone]  Patient Details  Name: Carl Clark MRN: [id number] Date of Birth: 11/16/1950  Subjective/Objective:  Pt admitted with NSTEMI- 3VD found on cath- plan for CABG on 6/28                  Action/Plan: PTA pt lived at home- independent still drives- pt speaks Arabic-needs interpreter- CM to follow for d/c needs post op.   Expected Discharge Date:                  Expected Discharge Plan:   unknown  In-House Referral:     Discharge planning Services  CM Consult  Post Acute Care Choice:    Choice offered to:     DME Arranged:    DME Agency:     HH Arranged:    HH Agency:     Status of Service:  In process, will continue to follow  If discussed at Long Length of Stay Meetings, dates discussed:    Discharge Disposition:   Additional Comments:  Darrold SpanWebster, Carl Andres Hall, RN 09/13/2016, 10:08 AM

## 2016-09-13 NOTE — Progress Notes (Signed)
ANTICOAGULATION CONSULT NOTE  Pharmacy Consult for heparin Indication: chest pain/ACS  Patient Measurements: Heparin Dosing Weight: 66.2 kg  Vital Signs: Temp: 97.9 F (36.6 C) (06/26 0508) Temp Source: Oral (06/26 0508) BP: 118/60 (06/26 1022) Pulse Rate: 60 (06/26 0508)  Labs:  Recent Labs  09/12/16 0621 09/12/16 [id number] 09/12/16 1315 09/13/16 0202 09/13/16 1221  HGB 12.5* 12.9*  --  13.0  --   HCT 36.1* 38.0*  --  38.1*  --   PLT 329  --   --  320  --   APTT 26  --   --   --   --   LABPROT 13.8  --   --   --   --   INR 1.06  --   --   --   --   HEPARINUNFRC  --   --  0.47 0.11* 0.47  CREATININE 1.05 0.90  --  0.77  --   TROPONINI 1.54*  --   --   --   --     Estimated Creatinine Clearance: 80.1 mL/min (by C-G formula based on SCr of 0.77 mg/dL).  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 Not on anticoagulation PTA. Heparin level therapeutic at 0.47. CBC stable. No bleed documented.  Goal of Therapy:  Heparin level 0.3-0.7 units/ml Monitor platelets by anticoagulation protocol: Yes    Plan:  -Heparin at 1000 units/h -Daily heparin level, CBC -Monitor for s/sx bleeding -CABG planned for 6/28   Babs BertinHaley Leam Madero, PharmD, BCPS Clinical Pharmacist Rx Phone # for today: [phone] After 3:30PM, please call Main Rx: [phone] 09/13/2016 1:12 PM

## 2016-09-13 NOTE — Progress Notes (Signed)
Progress Note  Patient Name: Carl Clark Date of Encounter: 09/13/2016  Primary Cardiologist: Excell Seltzer  Subjective   No chest pain or SOB  Inpatient Medications    Scheduled Meds: . aspirin EC  81 mg Oral Daily  . atorvastatin  80 mg Oral q1800  . lisinopril  20 mg Oral Daily   And  . hydrochlorothiazide  12.5 mg Oral Daily  . insulin aspart  0-9 Units Subcutaneous TID WC  . pneumococcal 23 valent vaccine  0.5 mL Intramuscular Tomorrow-1000  . sodium chloride flush  3 mL Intravenous Q12H   Continuous Infusions: . sodium chloride    . heparin 1,000 Units/hr (09/13/16 0323)   PRN Meds: sodium chloride, acetaminophen, nitroGLYCERIN, ondansetron (ZOFRAN) IV, sodium chloride flush   Vital Signs    Vitals:   09/12/16 1900 09/12/16 2024 09/13/16 0508 09/13/16 1022  BP: 137/71 (!) 151/70 104/60 118/60  Pulse: (!) 58 64 60   Resp: 16 18 18    Temp:  98.2 F (36.8 C) 97.9 F (36.6 C)   TempSrc:  Oral Oral   SpO2: 100% 100% 99%   Weight:      Height:        Intake/Output Summary (Last 24 hours) at 09/13/16 1332 Last data filed at 09/13/16 0900  Gross per 24 hour  Intake           996.83 ml  Output              851 ml  Net           145.83 ml   Filed Weights   09/12/16 0700 09/12/16 1155  Weight: 146 lb (66.2 kg) 136 lb 4.8 oz (61.8 kg)    Telemetry    Sinus - Personally Reviewed  ECG      Physical Exam   General: Well developed, well nourished, NAD  HEENT: OP clear, mucus membranes moist  SKIN: warm, dry. No rashes. Neuro: No focal deficits  Musculoskeletal: Muscle strength 5/5 all ext  Psychiatric: Mood and affect normal  Neck: No JVD, no carotid bruits, no thyromegaly, no lymphadenopathy.  Lungs:Clear bilaterally, no wheezes, rhonci, crackles Cardiovascular: Regular rate and rhythm. No murmurs, gallops or rubs. Abdomen:Soft. Bowel sounds present. Non-tender.  Extremities: No lower extremity edema. Pulses are 2 + in the bilateral  DP/PT.  Labs    Chemistry Recent Labs Lab 09/12/16 [phone] 09/12/16 0638 09/13/16 0202  NA 133* 135 137  K 3.6 3.7 3.6  CL 98* 97* 105  CO2 25  --  28  GLUCOSE 337* 355* 131*  BUN 30* 35* 13  CREATININE 1.05 0.90 0.77  CALCIUM 9.2  --  9.2  GFRNONAA >60  --  >60  GFRAA >60  --  >60  ANIONGAP 10  --  4*     Hematology Recent Labs Lab 09/12/16 0621 09/12/16 0638 09/13/16 0202  WBC 6.3  --  4.4  RBC 3.82*  --  4.12*  HGB 12.5* 12.9* 13.0  HCT 36.1* 38.0* 38.1*  MCV 94.5  --  92.5  MCH 32.7  --  31.6  MCHC 34.6  --  34.1  RDW 12.3  --  12.1  PLT 329  --  320    Cardiac Enzymes Recent Labs Lab 09/12/16 0621  TROPONINI 1.54*    Recent Labs Lab 09/12/16 0628  TROPIPOC 2.14*     BNPNo results for input(s): BNP, PROBNP in the last 168 hours.   DDimer No results for input(s): DDIMER in  the last 168 hours.   Radiology    Dg Chest Port 1 View  Result Date: 09/12/2016 CLINICAL DATA:  Recurrent chest pain for 2 days, diaphoresis and shortness of breath. History of hypertension and diabetes. EXAM: PORTABLE CHEST 1 VIEW COMPARISON:  Chest radiograph June 20, 2014 FINDINGS: Cardiomediastinal silhouette is normal. No pleural effusions or focal consolidations. Trachea projects midline and there is no pneumothorax. Soft tissue planes and included osseous structures are non-suspicious. IMPRESSION: Stable examination:  No acute cardiopulmonary process. Electronically Signed   By: Awilda Metroourtnay  Bloomer M.D.   On: 09/12/2016 06:46    Cardiac Studies   Cardiac cath 09/12/16: Diagnostic Diagram          Patient Profile     66 y.o. male with h/o HTN, HLD, CAD admitted with NSTEMI. Cardiac cath 09/12/16 with severe double vessel CAD (ostial LAD and ostial Circumflex). He is awaiting CABG.   Assessment & Plan    1. CAD/NSTEMI: Pt admitted with unstable angina, NSTEMI. Cardiac cath 09/12/16 with severe disease in the ostial LAD and ostial Circumflex (left main equivalent). He  has been seen by CT surgery and planning underway for CABG. Echo pending today. No chest pain. Continue ASA, statin. No beta blocker with bradycardia.   Signed, Verne Carrowhristopher McAlhany, MD  09/13/2016, 1:32 PM

## 2016-09-13 NOTE — Progress Notes (Signed)
ANTICOAGULATION CONSULT NOTE  Pharmacy Consult for heparin Indication: chest pain/ACS  Patient Measurements: Heparin Dosing Weight: 66.2 kg  Vital Signs: Temp: 98.2 F (36.8 C) (06/25 2024) Temp Source: Oral (06/25 2024) BP: 151/70 (06/25 2024) Pulse Rate: 64 (06/25 2024)  Labs:  Recent Labs  09/12/16 0621 09/12/16 [id number] 09/12/16 1315 09/13/16 0202  HGB 12.5* 12.9*  --  13.0  HCT 36.1* 38.0*  --  38.1*  PLT 329  --   --  320  APTT 26  --   --   --   LABPROT 13.8  --   --   --   INR 1.06  --   --   --   HEPARINUNFRC  --   --  0.47 0.11*  CREATININE 1.05 0.90  --  0.77  TROPONINI 1.54*  --   --   --     Estimated Creatinine Clearance: 80.1 mL/min (by C-G formula based on SCr of 0.77 mg/dL).  Assessment: 66 y.o. male with CAD awaiting CABG for heparin  Goal of Therapy:  Heparin level 0.3-0.7 units/ml Monitor platelets by anticoagulation protocol: Yes    Plan:  Increase Heparin 1000 units/hr Check heparin level in 8 hours.   Geannie RisenGreg Rosaleigh Brazzel, PharmD, BCPS  09/13/2016 3:17 AM

## 2016-09-13 NOTE — Progress Notes (Signed)
Inpatient Diabetes Program Recommendations  AACE/ADA: New Consensus Statement on Inpatient Glycemic Control (2015)  Target Ranges:  Prepandial:   less than 140 mg/dL      Peak postprandial:   less than 180 mg/dL (1-2 hours)      Critically ill patients:  140 - 180 mg/dL   Lab Results  Component Value Date   GLUCAP 366 (H) 09/13/2016   HGBA1C 9.6 (H) 09/12/2016    Review of Glycemic Control Results for Carl Clark, Carl Clark (MRN [id number]) as of 09/13/2016 14:53  Ref. Range 09/12/2016 12:03 09/12/2016 16:11 09/12/2016 20:32 09/13/2016 06:00 09/13/2016 10:46  Glucose-Capillary Latest Ref Range: 65 - 99 mg/dL 409254 (H) 811146 (H) 914225 (H) 145 (H) 366 (H)   Diabetes history: DM2 Outpatient Diabetes medications: Levemir 20 qd + glucotrol 10 mg bid + Metformin 1 gm Current orders for Inpatient glycemic control: Novolog correction 0-9 units tid  Inpatient Diabetes Program Recommendations:    Please consider restarting insulin -Levemir 10 units qd -Add Novolog 0-5 units q hs correction -Consider Novolog 3 units meal coverage if eats 50% while Glucotrol held  Thank you, Billy FischerJudy E. [name], RN, MSN, CDE  Diabetes Coordinator Inpatient Glycemic Control Team Team Pager [phone] (8am-5pm) 09/13/2016 2:57 PM

## 2016-09-14 ENCOUNTER — Inpatient Hospital Stay (HOSPITAL_COMMUNITY): Payer: BLUE CROSS/BLUE SHIELD

## 2016-09-14 DIAGNOSIS — I5030 Unspecified diastolic (congestive) heart failure: Secondary | ICD-10-CM

## 2016-09-14 DIAGNOSIS — I2510 Atherosclerotic heart disease of native coronary artery without angina pectoris: Secondary | ICD-10-CM

## 2016-09-14 LAB — ECHOCARDIOGRAM COMPLETE
HEIGHTINCHES: 65 in
WEIGHTICAEL: 2120 [oz_av]

## 2016-09-14 LAB — GLUCOSE, CAPILLARY
GLUCOSE-CAPILLARY: 348 mg/dL — AB (ref 65–99)
Glucose-Capillary: 234 mg/dL — ABNORMAL HIGH (ref 65–99)
Glucose-Capillary: 169 mg/dL — ABNORMAL HIGH (ref 65–99)
Glucose-Capillary: 273 mg/dL — ABNORMAL HIGH (ref 65–99)

## 2016-09-14 LAB — HEPARIN LEVEL (UNFRACTIONATED): Heparin Unfractionated: 0.53 IU/mL (ref 0.30–0.70)

## 2016-09-14 LAB — COMPREHENSIVE METABOLIC PANEL
ALT: 16 U/L — ABNORMAL LOW (ref 17–63)
AST: 14 U/L — ABNORMAL LOW (ref 15–41)
Albumin: 3.1 g/dL — ABNORMAL LOW (ref 3.5–5.0)
Alkaline Phosphatase: 56 U/L (ref 38–126)
Anion gap: 8 (ref 5–15)
BUN: 21 mg/dL — ABNORMAL HIGH (ref 6–20)
CO2: 26 mmol/L (ref 22–32)
Calcium: 9.1 mg/dL (ref 8.9–10.3)
Chloride: 102 mmol/L (ref 101–111)
Creatinine, Ser: 1.04 mg/dL (ref 0.61–1.24)
GFR calc Af Amer: >60 mL/min (ref >60)
GFR calc non Af Amer: >60 mL/min (ref >60)
Glucose, Bld: 258 mg/dL — ABNORMAL HIGH (ref 65–99)
Potassium: 3.7 mmol/L (ref 3.5–5.1)
Sodium: 136 mmol/L (ref 135–145)
Total Bilirubin: 0.8 mg/dL (ref 0.3–1.2)
Total Protein: 6.9 g/dL (ref 6.5–8.1)

## 2016-09-14 LAB — CBC
HEMATOCRIT: 38.3 % — AB (ref 39.0–52.0)
Hemoglobin: 12.7 g/dL — ABNORMAL LOW (ref 13.0–17.0)
MCH: 31.4 pg (ref 26.0–34.0)
MCHC: 33.2 g/dL (ref 30.0–36.0)
MCV: 94.8 fL (ref 78.0–100.0)
Platelets: 318 10*3/uL (ref 150–400)
RBC: 4.04 MIL/uL — ABNORMAL LOW (ref 4.22–5.81)
RDW: 12.4 % (ref 11.5–15.5)
WBC: 4.1 10*3/uL (ref 4.0–10.5)

## 2016-09-14 LAB — PROTIME-INR
INR: 0.99
Prothrombin Time: 13.1 seconds (ref 11.4–15.2)

## 2016-09-14 MED ORDER — CHLORHEXIDINE GLUCONATE CLOTH 2 % EX PADS
6.0000 | MEDICATED_PAD | Freq: Once | CUTANEOUS | Status: AC
  Administered 2016-09-15: 6 via TOPICAL

## 2016-09-14 MED ORDER — NITROGLYCERIN IN D5W 200-5 MCG/ML-% IV SOLN
2.0000 ug/min | INTRAVENOUS | Status: DC
  Filled 2016-09-14: qty 250

## 2016-09-14 MED ORDER — POTASSIUM CHLORIDE 2 MEQ/ML IV SOLN
80.0000 meq | INTRAVENOUS | Status: DC
  Filled 2016-09-14: qty 40

## 2016-09-14 MED ORDER — METOPROLOL TARTRATE 12.5 MG HALF TABLET
12.5000 mg | ORAL_TABLET | Freq: Once | ORAL | Status: AC
  Administered 2016-09-15: 12.5 mg via ORAL
  Filled 2016-09-14: qty 1

## 2016-09-14 MED ORDER — INSULIN REGULAR HUMAN 100 UNIT/ML IJ SOLN
INTRAMUSCULAR | Status: AC
  Administered 2016-09-15: 5.3 [IU]/h via INTRAVENOUS
  Filled 2016-09-14: qty 1

## 2016-09-14 MED ORDER — INSULIN ASPART 100 UNIT/ML ~~LOC~~ SOLN
0.0000 [IU] | Freq: Three times a day (TID) | SUBCUTANEOUS | Status: DC
Start: 2016-09-14 — End: 2016-09-15
  Administered 2016-09-14: 3 [IU] via SUBCUTANEOUS

## 2016-09-14 MED ORDER — BISACODYL 5 MG PO TBEC
5.0000 mg | DELAYED_RELEASE_TABLET | Freq: Once | ORAL | Status: DC
  Filled 2016-09-14: qty 1

## 2016-09-14 MED ORDER — MAGNESIUM SULFATE 50 % IJ SOLN
40.0000 meq | INTRAMUSCULAR | Status: DC
  Filled 2016-09-14: qty 10

## 2016-09-14 MED ORDER — EPINEPHRINE PF 1 MG/ML IJ SOLN
0.0000 ug/min | INTRAMUSCULAR | Status: DC
  Filled 2016-09-14: qty 4

## 2016-09-14 MED ORDER — DEXTROSE 5 % IV SOLN
750.0000 mg | INTRAVENOUS | Status: DC
  Filled 2016-09-14: qty 750

## 2016-09-14 MED ORDER — TRANEXAMIC ACID 1000 MG/10ML IV SOLN
1.5000 mg/kg/h | INTRAVENOUS | Status: AC
  Administered 2016-09-15: 1.5 mg/kg/h via INTRAVENOUS
  Filled 2016-09-14: qty 25

## 2016-09-14 MED ORDER — CHLORHEXIDINE GLUCONATE CLOTH 2 % EX PADS
6.0000 | MEDICATED_PAD | Freq: Once | CUTANEOUS | Status: AC
  Administered 2016-09-14: 6 via TOPICAL

## 2016-09-14 MED ORDER — TEMAZEPAM 15 MG PO CAPS: 15.0000 mg | ORAL_CAPSULE | Freq: Once | ORAL | Status: DC | PRN

## 2016-09-14 MED ORDER — HEPARIN SODIUM (PORCINE) 1000 UNIT/ML IJ SOLN
INTRAMUSCULAR | Status: DC
  Filled 2016-09-14: qty 30

## 2016-09-14 MED ORDER — PLASMA-LYTE 148 IV SOLN
INTRAVENOUS | Status: AC
  Administered 2016-09-15: 500 mL
  Filled 2016-09-14: qty 2.5

## 2016-09-14 MED ORDER — CEFUROXIME SODIUM 1.5 G IV SOLR
1.5000 g | INTRAVENOUS | Status: AC
  Administered 2016-09-15: 1.5 g via INTRAVENOUS
  Administered 2016-09-15: .75 g via INTRAVENOUS
  Filled 2016-09-14: qty 1.5

## 2016-09-14 MED ORDER — INSULIN ASPART 100 UNIT/ML ~~LOC~~ SOLN: 4.0000 [IU] | Freq: Three times a day (TID) | SUBCUTANEOUS | Status: DC

## 2016-09-14 MED ORDER — DOPAMINE-DEXTROSE 3.2-5 MG/ML-% IV SOLN
0.0000 ug/kg/min | INTRAVENOUS | Status: AC
Start: 2016-09-15 — End: 2016-09-16
  Administered 2016-09-15: 2.5 ug/kg/min via INTRAVENOUS
  Filled 2016-09-14: qty 250

## 2016-09-14 MED ORDER — TRANEXAMIC ACID (OHS) BOLUS VIA INFUSION
15.0000 mg/kg | INTRAVENOUS | Status: AC
  Administered 2016-09-15: 901.5 mg via INTRAVENOUS
  Filled 2016-09-14: qty 902

## 2016-09-14 MED ORDER — VANCOMYCIN HCL 10 G IV SOLR
1250.0000 mg | INTRAVENOUS | Status: AC
  Administered 2016-09-15: 1250 mg via INTRAVENOUS
  Filled 2016-09-14: qty 1250

## 2016-09-14 MED ORDER — TRANEXAMIC ACID (OHS) PUMP PRIME SOLUTION
2.0000 mg/kg | INTRAVENOUS | Status: DC
  Filled 2016-09-14: qty 1.2

## 2016-09-14 MED ORDER — CHLORHEXIDINE GLUCONATE 0.12 % MT SOLN
15.0000 mL | Freq: Once | OROMUCOSAL | Status: AC
  Administered 2016-09-15: 15 mL via OROMUCOSAL
  Filled 2016-09-14: qty 15

## 2016-09-14 MED ORDER — DEXMEDETOMIDINE HCL IN NACL 400 MCG/100ML IV SOLN
0.1000 ug/kg/h | INTRAVENOUS | Status: AC
  Administered 2016-09-15: .3 ug/kg/h via INTRAVENOUS
  Filled 2016-09-14: qty 100

## 2016-09-14 MED ORDER — PHENYLEPHRINE HCL 10 MG/ML IJ SOLN
30.0000 ug/min | INTRAMUSCULAR | Status: AC
  Administered 2016-09-15: 20 ug/min via INTRAVENOUS
  Filled 2016-09-14: qty 2

## 2016-09-14 NOTE — Progress Notes (Signed)
Patient ID: Carl Clark, male   DOB: May 13, 1950, 66 y.o.   MRN: [id number]      [address]       Carl Clark [address]             [phone]                 2 Days Post-Op Procedure(s) (LRB): Left Heart Cath and Coronary Angiography (N/A)  LOS: 2 days   Subjective: Comfortable , on heparin no chest pain   Objective: Vital signs in last 24 hours: Patient Vitals for the past 24 hrs:  BP Temp Temp src Pulse Resp SpO2 Weight  09/14/16 1300 129/62 97.6 F (36.4 C) Oral 66 20 100 % -  09/14/16 0952 (!) 117/54 - - 75 - - -  09/14/16 0538 119/66 97.5 F (36.4 C) Oral 79 18 98 % 132 lb 8 oz (60.1 kg)  09/13/16 1931 133/71 97.8 F (36.6 C) Oral (!) 57 18 100 % -    Filed Weights   09/12/16 0700 09/12/16 1155 09/14/16 0538  Weight: 146 lb (66.2 kg) 136 lb 4.8 oz (61.8 kg) 132 lb 8 oz (60.1 kg)    Hemodynamic parameters for last 24 hours:    Intake/Output from previous day: 06/26 0701 - 06/27 0700 In: 120 [P.O.:120] Out: 250 [Urine:250] Intake/Output this shift: Total I/O In: 360 [P.O.:360] Out: -   Scheduled Meds: . aspirin EC  81 mg Oral Daily  . atorvastatin  80 mg Oral q1800  . lisinopril  20 mg Oral Daily   And  . hydrochlorothiazide  12.5 mg Oral Daily  . insulin aspart  0-15 Units Subcutaneous TID WC  . insulin aspart  4 Units Subcutaneous TID WC  . pneumococcal 23 valent vaccine  0.5 mL Intramuscular Tomorrow-1000  . sodium chloride flush  3 mL Intravenous Q12H   Continuous Infusions: . sodium chloride    . heparin 1,000 Units/hr (09/14/16 1214)   PRN Meds:.sodium chloride, acetaminophen, nitroGLYCERIN, ondansetron (ZOFRAN) IV, sodium chloride flush  General appearance: alert and cooperative Neurologic: intact Heart: regular rate and rhythm, S1, S2 normal, no murmur, click, rub or gallop Lungs: clear to auscultation bilaterally Abdomen: soft, non-tender; bowel sounds normal; no masses,  no organomegaly Extremities:  extremities normal, atraumatic, no cyanosis or edema and Homans sign is negative, no sign of DVT  Lab Results: CBC: Recent Labs  09/13/16 0202 09/14/16 0551  WBC 4.4 4.1  HGB 13.0 12.7*  HCT 38.1* 38.3*  PLT 320 318   BMET:  Recent Labs  09/13/16 0202 09/14/16 0551  NA 137 136  K 3.6 3.7  CL 105 102  CO2 28 26  GLUCOSE 131* 258*  BUN 13 21*  CREATININE 0.77 1.04  CALCIUM 9.2 9.1    PT/INR:  Recent Labs  09/14/16 0551  LABPROT 13.1  INR 0.99     Radiology No results found.   Assessment/Plan: S/P Procedure(s) (LRB): Left Heart Cath and Coronary Angiography (N/A) Plan Cabg in am. I have again reviewed with the patient the planned procedure and the reasons to proceed.  The goals risks and alternatives of the planned surgical procedure Coronary Artery Bypass   have been discussed with the patient in detail. The risks of the procedure including death, infection, stroke, myocardial infarction, bleeding, blood transfusion have all been discussed specifically.  I have quoted [name] a 3 % of perioperative mortality and a complication rate as high as 40%. The patient's questions have  been answered.Carl Clark is willing  to proceed with the planned procedure. Plan for am.    Carl OvensEdward B Marney Treloar MD 09/14/2016 3:57 PM

## 2016-09-14 NOTE — Progress Notes (Signed)
  Echocardiogram 2D Echocardiogram has been performed.  Pieter PartridgeBrooke S Darra Rosa 09/14/2016, 11:55 AM

## 2016-09-14 NOTE — Progress Notes (Signed)
CARDIAC REHAB PHASE I   PRE:  Rate/Rhythm: 71 SR  BP:  Supine:   Sitting: 94/60  Standing:    SaO2: 98%RA  MODE:  Ambulation: 510 ft   POST:  Rate/Rhythm: 73 SR  BP:  Supine: 110/60  Sitting:   Standing:    SaO2: 99%RA 1034-1130 Pt walked 510 ft with steady gait,. Tolerated well. Had interpreter Virgel BouquetJameel Ali for pre op education. Discussed importance of IS and mobility after surgery. Gave pt IS and had him use correctly. Discussed sternal precautions and demonstrated getting up and down without use of arms. Answered pt's questions re recovery after surgery . Pt informed he will need someone with him after discharge first week. He stated he will have his roommate. Since pt can read some English care guide and [facility] booklet made available. Put on pre op video for pt to view. Pt requested that same interpreter be here before surgery as he would like to have him for support. Notified RN. Interpreter said he would be available at that time.    Luetta Nuttingharlene Garner Dullea, RN BSN  09/14/2016 11:17 AM

## 2016-09-14 NOTE — Progress Notes (Signed)
Progress Note  Patient Name: Carl Clark Date of Encounter: 09/14/2016  Primary Cardiologist: Ellis ParentsNew Excell Seltzer(Devlin Brink)  Subjective   Feeling well. No chest pain or shortness of breath.   Inpatient Medications    Scheduled Meds: . aspirin EC  81 mg Oral Daily  . atorvastatin  80 mg Oral q1800  . lisinopril  20 mg Oral Daily   And  . hydrochlorothiazide  12.5 mg Oral Daily  . insulin aspart  0-9 Units Subcutaneous TID WC  . pneumococcal 23 valent vaccine  0.5 mL Intramuscular Tomorrow-1000  . sodium chloride flush  3 mL Intravenous Q12H   Continuous Infusions: . sodium chloride    . heparin 1,000 Units/hr (09/14/16 1214)   PRN Meds: sodium chloride, acetaminophen, nitroGLYCERIN, ondansetron (ZOFRAN) IV, sodium chloride flush   Vital Signs    Vitals:   09/13/16 1931 09/14/16 0538 09/14/16 0952 09/14/16 1300  BP: 133/71 119/66 (!) 117/54 129/62  Pulse: (!) 57 79 75 66  Resp: 18 18  20   Temp: 97.8 F (36.6 C) 97.5 F (36.4 C)  97.6 F (36.4 C)  TempSrc: Oral Oral  Oral  SpO2: 100% 98%  100%  Weight:  132 lb 8 oz (60.1 kg)    Height:        Intake/Output Summary (Last 24 hours) at 09/14/16 1503 Last data filed at 09/14/16 1230  Gross per 24 hour  Intake              360 ml  Output              250 ml  Net              110 ml   Filed Weights   09/12/16 0700 09/12/16 1155 09/14/16 0538  Weight: 146 lb (66.2 kg) 136 lb 4.8 oz (61.8 kg) 132 lb 8 oz (60.1 kg)    Telemetry    Normal sinus rhythm - Personally Reviewed   Physical Exam  Alert, oriented, NAD GEN: No acute distress.   Neck: No JVD Cardiac: RRR, no murmurs, rubs, or gallops.  Respiratory: Clear to auscultation bilaterally. GI: Soft, nontender, non-distended  MS: No edema; No deformity. Neuro:  Nonfocal  Psych: Normal affect   Labs    Chemistry Recent Labs Lab 09/12/16 0621 09/12/16 0638 09/13/16 0202 09/14/16 0551  NA 133* 135 137 136  K 3.6 3.7 3.6 3.7  CL 98* 97* 105 102    CO2 25  --  28 26  GLUCOSE 337* 355* 131* 258*  BUN 30* 35* 13 21*  CREATININE 1.05 0.90 0.77 1.04  CALCIUM 9.2  --  9.2 9.1  PROT  --   --   --  6.9  ALBUMIN  --   --   --  3.1*  AST  --   --   --  14*  ALT  --   --   --  16*  ALKPHOS  --   --   --  56  BILITOT  --   --   --  0.8  GFRNONAA >60  --  >60 >60  GFRAA >60  --  >60 >60  ANIONGAP 10  --  4* 8     Hematology Recent Labs Lab 09/12/16 0621 09/12/16 0638 09/13/16 0202 09/14/16 0551  WBC 6.3  --  4.4 4.1  RBC 3.82*  --  4.12* 4.04*  HGB 12.5* 12.9* 13.0 12.7*  HCT 36.1* 38.0* 38.1* 38.3*  MCV 94.5  --  92.5  94.8  MCH 32.7  --  31.6 31.4  MCHC 34.6  --  34.1 33.2  RDW 12.3  --  12.1 12.4  PLT 329  --  320 318    Cardiac Enzymes Recent Labs Lab 09/12/16 0621  TROPONINI 1.54*    Recent Labs Lab 09/12/16 0628  TROPIPOC 2.14*     BNPNo results for input(s): BNP, PROBNP in the last 168 hours.   DDimer No results for input(s): DDIMER in the last 168 hours.   Radiology    No results found.  Echo 09/14/2016: Study Conclusions  - Left ventricle: The cavity size was normal. Wall thickness was   increased in a pattern of mild LVH. Systolic function was normal.   The estimated ejection fraction was in the range of 60% to 65%.   Wall motion was normal; there were no regional wall motion   abnormalities. Doppler parameters are consistent with abnormal   left ventricular relaxation (grade 1 diastolic dysfunction). - Aortic valve: There was no stenosis. - Mitral valve: There was no significant regurgitation. - Right ventricle: The cavity size was normal. Systolic function   was normal. - Tricuspid valve: Peak RV-RA gradient (S): 33 mm Hg. - Pulmonary arteries: PA peak pressure: 36 mm Hg (S). - Inferior vena cava: The vessel was normal in size. The   respirophasic diameter changes were in the normal range (= 50%),   consistent with normal central venous pressure.  Impressions:  - Normal LV size with  mild LV hypertrophy. EF 60-65%. Normal RV   size and systolic function. No significant valvular   abnormalities. Borderline pulmonary hypertension.  Patient Profile     66 y.o. male with h/o HTN, HLD, CAD admitted with NSTEMI. Cardiac cath 09/12/16 with severe double vessel CAD (ostial LAD and ostial Circumflex). He is awaiting CABG.  Assessment & Plan    1. NSTEMI: severe multivessel CAD awaiting CABG tomorrow by Dr Tyrone Sage. Pt stable on IV heparin with no recurrent anginal symptoms. He is on ASA and a high-intensity statin. No beta-blocker because of periods of bradycardia.   2. Diabetes, insulin-requiring: poor glycemic control. Change SSI to moderate and add meal coverage.   3. Hyperlipidemia: LDL 123 mg/dL. Continue high-dose atorvastatin.   Enzo Bi, MD  09/14/2016, 3:03 PM

## 2016-09-14 NOTE — Progress Notes (Signed)
ANTICOAGULATION CONSULT NOTE  Pharmacy Consult for heparin Indication: chest pain/ACS  Patient Measurements: Heparin Dosing Weight: 66.2 kg  Vital Signs: Temp: 97.5 F (36.4 C) (06/27 0538) Temp Source: Oral (06/27 0538) BP: 119/66 (06/27 0538) Pulse Rate: 79 (06/27 0538)  Labs:  Recent Labs  09/12/16 [id number] 09/12/16 [id number]  09/13/16 0202 09/13/16 1221 09/14/16 0551  HGB 12.5* 12.9*  --  13.0  --  12.7*  HCT 36.1* 38.0*  --  38.1*  --  38.3*  PLT 329  --   --  320  --  318  APTT 26  --   --   --   --   --   LABPROT 13.8  --   --   --   --  13.1  INR 1.06  --   --   --   --  0.99  HEPARINUNFRC  --   --   < > 0.11* 0.47 0.53  CREATININE 1.05 0.90  --  0.77  --  1.04  TROPONINI 1.54*  --   --   --   --   --   < > = values in this interval not displayed.  Estimated Creatinine Clearance: 60.2 mL/min (by C-G formula based on SCr of 1.04 mg/dL).  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 Not on anticoagulation PTA. Heparin level therapeutic at 0.53. CBC stable. No bleed documented.  Goal of Therapy:  Heparin level 0.3-0.7 units/ml Monitor platelets by anticoagulation protocol: Yes    Plan:  -Heparin at 1000 units/h -Daily heparin level, CBC -Monitor for s/sx bleeding -CABG planned for 6/28   Babs BertinHaley Xochilt Conant, PharmD, BCPS Clinical Pharmacist Rx Phone # for today: [phone] After 3:30PM, please call Main Rx: [phone] 09/14/2016 8:34 AM

## 2016-09-14 NOTE — Anesthesia Preprocedure Evaluation (Addendum)
Anesthesia Evaluation    Reviewed: Allergy & Precautions, Patient's Chart, lab work & pertinent test results  Airway Mallampati: II  TM Distance: >3 FB Neck ROM: Full    Dental  (+) Teeth Intact, Dental Advisory Given   Pulmonary neg pulmonary ROS,    Pulmonary exam normal breath sounds clear to auscultation       Cardiovascular hypertension, Pt. on medications + CAD and + Past MI  Normal cardiovascular exam Rhythm:Regular Rate:Normal  Echo 09/14/16: Study Conclusions  - Left ventricle: The cavity size was normal. Wall thickness was increased in a pattern of mild LVH. Systolic function was normal. The estimated ejection fraction was in the range of 60% to 65%. Wall motion was normal; there were no regional wall motion abnormalities. Doppler parameters are consistent with abnormal left ventricular relaxation (grade 1 diastolic dysfunction). - Aortic valve: There was no stenosis. - Mitral valve: There was no significant regurgitation. - Right ventricle: The cavity size was normal. Systolic function was normal. - Tricuspid valve: Peak RV-RA gradient (S): 33 mm Hg. - Pulmonary arteries: PA peak pressure: 36 mm Hg (S). - Inferior vena cava: The vessel was normal in size. The   respirophasic diameter changes were in the normal range (= 50%), consistent with normal central venous pressure.  Impressions:  - Normal LV size with mild LV hypertrophy. EF 60-65%. Normal RV size and systolic function. No significant valvular abnormalities. Borderline pulmonary hypertension.   Neuro/Psych negative neurological ROS  negative psych ROS   GI/Hepatic negative GI ROS, Neg liver ROS,   Endo/Other  diabetes, Poorly Controlled, Type 2, Insulin Dependent, Oral Hypoglycemic Agents  Renal/GU negative Renal ROS     Musculoskeletal negative musculoskeletal ROS (+)   Abdominal   Peds  Hematology  (+) Blood dyscrasia, anemia ,   Anesthesia  Other Findings Day of surgery medications reviewed with the patient.  Reproductive/Obstetrics                            Anesthesia Physical Anesthesia Plan  ASA: IV  Anesthesia Plan: General   Post-op Pain Management:    Induction: Intravenous  PONV Risk Score and Plan: 2 and Ondansetron, Dexamethasone and Treatment may vary due to age or medical condition  Airway Management Planned: Oral ETT  Additional Equipment: Arterial line, CVP, PA Cath, TEE and Ultrasound Guidance Line Placement  Intra-op Plan:   Post-operative Plan: Post-operative intubation/ventilation  Informed Consent: I have reviewed the patients History and Physical, chart, labs and discussed the procedure including the risks, benefits and alternatives for the proposed anesthesia with the patient or authorized representative who has indicated his/her understanding and acceptance.   Dental advisory given  Plan Discussed with: CRNA  Anesthesia Plan Comments:        Anesthesia Quick Evaluation

## 2016-09-15 ENCOUNTER — Inpatient Hospital Stay (HOSPITAL_COMMUNITY): Payer: BLUE CROSS/BLUE SHIELD | Admitting: Certified Registered Nurse Anesthetist

## 2016-09-15 ENCOUNTER — Inpatient Hospital Stay (HOSPITAL_COMMUNITY): Payer: BLUE CROSS/BLUE SHIELD

## 2016-09-15 ENCOUNTER — Inpatient Hospital Stay (HOSPITAL_COMMUNITY)
Admission: EM | Disposition: A | Discharge status: Home / Self Care | Payer: Self-pay | Source: Home / Self Care | Attending: Cardiovascular Disease

## 2016-09-15 HISTORY — PX: TEE WITHOUT CARDIOVERSION: SHX5443

## 2016-09-15 HISTORY — PX: CORONARY ARTERY BYPASS GRAFT: SHX141

## 2016-09-15 LAB — POCT I-STAT, CHEM 8
BUN: 21 mg/dL — AB (ref 6–20)
BUN: 15 mg/dL (ref 6–20)
BUN: 19 mg/dL (ref 6–20)
BUN: 17 mg/dL (ref 6–20)
BUN: 16 mg/dL (ref 6–20)
BUN: 13 mg/dL (ref 6–20)
CALCIUM ION: 0.99 mmol/L — AB (ref 1.15–1.40)
CALCIUM ION: 1.16 mmol/L (ref 1.15–1.40)
CHLORIDE: 101 mmol/L (ref 101–111)
CHLORIDE: 98 mmol/L — AB (ref 101–111)
CHLORIDE: 102 mmol/L (ref 101–111)
CREATININE: 0.8 mg/dL (ref 0.61–1.24)
CREATININE: 0.5 mg/dL — AB (ref 0.61–1.24)
CREATININE: 0.6 mg/dL — AB (ref 0.61–1.24)
Calcium, Ion: 1.26 mmol/L (ref 1.15–1.40)
Calcium, Ion: 1.28 mmol/L (ref 1.15–1.40)
Calcium, Ion: 1.19 mmol/L (ref 1.15–1.40)
Calcium, Ion: 1.21 mmol/L (ref 1.15–1.40)
Chloride: 98 mmol/L — ABNORMAL LOW (ref 101–111)
Chloride: 97 mmol/L — ABNORMAL LOW (ref 101–111)
Chloride: 99 mmol/L — ABNORMAL LOW (ref 101–111)
Creatinine, Ser: 0.7 mg/dL (ref 0.61–1.24)
Creatinine, Ser: 0.6 mg/dL — ABNORMAL LOW (ref 0.61–1.24)
Creatinine, Ser: 0.7 mg/dL (ref 0.61–1.24)
GLUCOSE: 323 mg/dL — AB (ref 65–99)
GLUCOSE: 226 mg/dL — AB (ref 65–99)
GLUCOSE: 185 mg/dL — AB (ref 65–99)
GLUCOSE: 199 mg/dL — AB (ref 65–99)
GLUCOSE: 183 mg/dL — AB (ref 65–99)
Glucose, Bld: 159 mg/dL — ABNORMAL HIGH (ref 65–99)
HCT: 23 % — ABNORMAL LOW (ref 39.0–52.0)
HCT: 30 % — ABNORMAL LOW (ref 39.0–52.0)
HCT: 23 % — ABNORMAL LOW (ref 39.0–52.0)
HCT: 22 % — ABNORMAL LOW (ref 39.0–52.0)
HCT: 25 % — ABNORMAL LOW (ref 39.0–52.0)
HEMATOCRIT: 34 % — AB (ref 39.0–52.0)
HEMOGLOBIN: 7.8 g/dL — AB (ref 13.0–17.0)
HEMOGLOBIN: 7.5 g/dL — AB (ref 13.0–17.0)
Hemoglobin: 11.6 g/dL — ABNORMAL LOW (ref 13.0–17.0)
Hemoglobin: 10.2 g/dL — ABNORMAL LOW (ref 13.0–17.0)
Hemoglobin: 7.8 g/dL — ABNORMAL LOW (ref 13.0–17.0)
Hemoglobin: 8.5 g/dL — ABNORMAL LOW (ref 13.0–17.0)
POTASSIUM: 4 mmol/L (ref 3.5–5.1)
POTASSIUM: 3.8 mmol/L (ref 3.5–5.1)
POTASSIUM: 3.9 mmol/L (ref 3.5–5.1)
POTASSIUM: 3.9 mmol/L (ref 3.5–5.1)
Potassium: 4 mmol/L (ref 3.5–5.1)
Potassium: 3.7 mmol/L (ref 3.5–5.1)
SODIUM: 136 mmol/L (ref 135–145)
Sodium: 136 mmol/L (ref 135–145)
Sodium: 140 mmol/L (ref 135–145)
Sodium: 141 mmol/L (ref 135–145)
Sodium: 137 mmol/L (ref 135–145)
Sodium: 138 mmol/L (ref 135–145)
TCO2: 29 mmol/L (ref 0–100)
TCO2: 28 mmol/L (ref 0–100)
TCO2: 29 mmol/L (ref 0–100)
TCO2: 27 mmol/L (ref 0–100)
TCO2: 27 mmol/L (ref 0–100)
TCO2: 24 mmol/L (ref 0–100)

## 2016-09-15 LAB — POCT I-STAT 4, (NA,K, GLUC, HGB,HCT)
GLUCOSE: 108 mg/dL — AB (ref 65–99)
HEMATOCRIT: 27 % — AB (ref 39.0–52.0)
HEMOGLOBIN: 9.2 g/dL — AB (ref 13.0–17.0)
Potassium: 3.4 mmol/L — ABNORMAL LOW (ref 3.5–5.1)
Sodium: 144 mmol/L (ref 135–145)

## 2016-09-15 LAB — POCT I-STAT 3, ART BLOOD GAS (G3+)
ACID-BASE DEFICIT: 2 mmol/L (ref 0.0–2.0)
ACID-BASE EXCESS: 2 mmol/L (ref 0.0–2.0)
Acid-base deficit: 1 mmol/L (ref 0.0–2.0)
BICARBONATE: 26.9 mmol/L (ref 20.0–28.0)
BICARBONATE: 23.9 mmol/L (ref 20.0–28.0)
Bicarbonate: 23 mmol/L (ref 20.0–28.0)
Bicarbonate: 24 mmol/L (ref 20.0–28.0)
O2 SAT: 100 %
O2 SAT: 100 %
O2 Saturation: 99 %
O2 Saturation: 99 %
PCO2 ART: 40.4 mmHg (ref 32.0–48.0)
PH ART: 7.41 (ref 7.350–7.450)
PH ART: 7.403 (ref 7.350–7.450)
PH ART: 7.432 (ref 7.350–7.450)
PO2 ART: 158 mmHg — AB (ref 83.0–108.0)
Patient temperature: 37.8
Patient temperature: 38.1
TCO2: 28 mmol/L (ref 0–100)
TCO2: 24 mmol/L (ref 0–100)
TCO2: 25 mmol/L (ref 0–100)
TCO2: 25 mmol/L (ref 0–100)
pCO2 arterial: 42.5 mmHg (ref 32.0–48.0)
pCO2 arterial: 36.2 mmHg (ref 32.0–48.0)
pCO2 arterial: 36 mmHg (ref 32.0–48.0)
pH, Arterial: 7.386 (ref 7.350–7.450)
pO2, Arterial: 379 mmHg — ABNORMAL HIGH (ref 83.0–108.0)
pO2, Arterial: 174 mmHg — ABNORMAL HIGH (ref 83.0–108.0)
pO2, Arterial: 136 mmHg — ABNORMAL HIGH (ref 83.0–108.0)

## 2016-09-15 LAB — BASIC METABOLIC PANEL
Anion gap: 8 (ref 5–15)
BUN: 21 mg/dL — ABNORMAL HIGH (ref 6–20)
CO2: 27 mmol/L (ref 22–32)
Calcium: 9.4 mg/dL (ref 8.9–10.3)
Chloride: 96 mmol/L — ABNORMAL LOW (ref 101–111)
Creatinine, Ser: 1.34 mg/dL — ABNORMAL HIGH (ref 0.61–1.24)
GFR calc Af Amer: >60 mL/min (ref >60)
GFR calc non Af Amer: 54 mL/min — ABNORMAL LOW (ref >60)
Glucose, Bld: 419 mg/dL — ABNORMAL HIGH (ref 65–99)
Potassium: 3.9 mmol/L (ref 3.5–5.1)
Sodium: 131 mmol/L — ABNORMAL LOW (ref 135–145)

## 2016-09-15 LAB — CBC
HCT: 40.6 % (ref 39.0–52.0)
HCT: 26.8 % — ABNORMAL LOW (ref 39.0–52.0)
HEMATOCRIT: 27.8 % — AB (ref 39.0–52.0)
HEMOGLOBIN: 13.5 g/dL (ref 13.0–17.0)
Hemoglobin: 9.3 g/dL — ABNORMAL LOW (ref 13.0–17.0)
Hemoglobin: 9.1 g/dL — ABNORMAL LOW (ref 13.0–17.0)
MCH: 31.3 pg (ref 26.0–34.0)
MCH: 31.3 pg (ref 26.0–34.0)
MCH: 31.7 pg (ref 26.0–34.0)
MCHC: 33.3 g/dL (ref 30.0–36.0)
MCHC: 33.5 g/dL (ref 30.0–36.0)
MCHC: 34 g/dL (ref 30.0–36.0)
MCV: 94.2 fL (ref 78.0–100.0)
MCV: 93.6 fL (ref 78.0–100.0)
MCV: 93.4 fL (ref 78.0–100.0)
Platelets: 343 10*3/uL (ref 150–400)
Platelets: 136 10*3/uL — ABNORMAL LOW (ref 150–400)
Platelets: 150 10*3/uL (ref 150–400)
RBC: 4.31 MIL/uL (ref 4.22–5.81)
RBC: 2.97 MIL/uL — ABNORMAL LOW (ref 4.22–5.81)
RBC: 2.87 MIL/uL — ABNORMAL LOW (ref 4.22–5.81)
RDW: 12.2 % (ref 11.5–15.5)
RDW: 12.3 % (ref 11.5–15.5)
RDW: 12.3 % (ref 11.5–15.5)
WBC: 4.5 10*3/uL (ref 4.0–10.5)
WBC: 7.5 10*3/uL (ref 4.0–10.5)
WBC: 10 10*3/uL (ref 4.0–10.5)

## 2016-09-15 LAB — CREATININE, SERUM
Creatinine, Ser: 0.81 mg/dL (ref 0.61–1.24)
GFR calc Af Amer: >60 mL/min (ref >60)
GFR calc non Af Amer: >60 mL/min (ref >60)

## 2016-09-15 LAB — GLUCOSE, CAPILLARY
GLUCOSE-CAPILLARY: 23 mg/dL — AB (ref 65–99)
GLUCOSE-CAPILLARY: 138 mg/dL — AB (ref 65–99)
GLUCOSE-CAPILLARY: 136 mg/dL — AB (ref 65–99)
GLUCOSE-CAPILLARY: 136 mg/dL — AB (ref 65–99)
Glucose-Capillary: 65 mg/dL (ref 65–99)
Glucose-Capillary: 88 mg/dL (ref 65–99)
Glucose-Capillary: 125 mg/dL — ABNORMAL HIGH (ref 65–99)
Glucose-Capillary: 136 mg/dL — ABNORMAL HIGH (ref 65–99)
Glucose-Capillary: 116 mg/dL — ABNORMAL HIGH (ref 65–99)

## 2016-09-15 LAB — PREPARE RBC (CROSSMATCH)

## 2016-09-15 LAB — MAGNESIUM: Magnesium: 2.3 mg/dL (ref 1.7–2.4)

## 2016-09-15 LAB — HEMOGLOBIN AND HEMATOCRIT, BLOOD
HCT: 23 % — ABNORMAL LOW (ref 39.0–52.0)
Hemoglobin: 7.8 g/dL — ABNORMAL LOW (ref 13.0–17.0)

## 2016-09-15 LAB — PROTIME-INR
INR: 1.57
Prothrombin Time: 18.9 seconds — ABNORMAL HIGH (ref 11.4–15.2)

## 2016-09-15 LAB — APTT: aPTT: 30 seconds (ref 24–36)

## 2016-09-15 LAB — SURGICAL PCR SCREEN
MRSA, PCR: NEGATIVE
Staphylococcus aureus: POSITIVE — AB

## 2016-09-15 LAB — HEPARIN LEVEL (UNFRACTIONATED): HEPARIN UNFRACTIONATED: 0.53 [IU]/mL (ref 0.30–0.70)

## 2016-09-15 LAB — PLATELET COUNT: Platelets: 212 10*3/uL (ref 150–400)

## 2016-09-15 SURGERY — CORONARY ARTERY BYPASS GRAFTING (CABG)
Anesthesia: General | Site: Chest

## 2016-09-15 MED ORDER — HEPARIN SODIUM (PORCINE) 1000 UNIT/ML IJ SOLN
INTRAMUSCULAR | Status: AC
Start: 2016-09-15 — End: 2016-09-15
  Filled 2016-09-15: qty 1

## 2016-09-15 MED ORDER — CHLORHEXIDINE GLUCONATE 0.12 % MT SOLN
15.0000 mL | OROMUCOSAL | Status: AC
  Administered 2016-09-15: 15 mL via OROMUCOSAL
  Filled 2016-09-15: qty 15

## 2016-09-15 MED ORDER — ALBUMIN HUMAN 5 % IV SOLN
250.0000 mL | INTRAVENOUS | Status: DC | PRN
  Administered 2016-09-15 (×2): 250 mL via INTRAVENOUS
  Filled 2016-09-15: qty 250

## 2016-09-15 MED ORDER — FENTANYL CITRATE (PF) 250 MCG/5ML IJ SOLN
INTRAMUSCULAR | Status: DC | PRN
  Administered 2016-09-15: 50 ug via INTRAVENOUS
  Administered 2016-09-15 (×2): 100 ug via INTRAVENOUS
  Administered 2016-09-15: 200 ug via INTRAVENOUS
  Administered 2016-09-15 (×3): 50 ug via INTRAVENOUS
  Administered 2016-09-15: 150 ug via INTRAVENOUS
  Administered 2016-09-15: 100 ug via INTRAVENOUS

## 2016-09-15 MED ORDER — ORAL CARE MOUTH RINSE
15.0000 mL | Freq: Two times a day (BID) | OROMUCOSAL | Status: DC
  Administered 2016-09-16: 15 mL via OROMUCOSAL

## 2016-09-15 MED ORDER — TRAMADOL HCL 50 MG PO TABS
50.0000 mg | ORAL_TABLET | ORAL | Status: DC | PRN
  Administered 2016-09-16 (×3): 100 mg via ORAL
  Filled 2016-09-15 (×2): qty 2
  Filled 2016-09-15: qty 1
  Filled 2016-09-15: qty 2

## 2016-09-15 MED ORDER — MORPHINE SULFATE (PF) 4 MG/ML IV SOLN
2.0000 mg | INTRAVENOUS | Status: DC | PRN
  Administered 2016-09-15: 4 mg via INTRAVENOUS
  Administered 2016-09-15: 2 mg via INTRAVENOUS
  Administered 2016-09-16 (×3): 4 mg via INTRAVENOUS
  Filled 2016-09-15 (×5): qty 1

## 2016-09-15 MED ORDER — POTASSIUM CHLORIDE 10 MEQ/50ML IV SOLN
10.0000 meq | INTRAVENOUS | Status: AC
  Administered 2016-09-15 (×3): 10 meq via INTRAVENOUS
  Filled 2016-09-15: qty 50

## 2016-09-15 MED ORDER — NITROGLYCERIN IN D5W 200-5 MCG/ML-% IV SOLN: 0.0000 ug/min | INTRAVENOUS | Status: DC

## 2016-09-15 MED ORDER — CALCIUM CHLORIDE 10 % IV SOLN
INTRAVENOUS | Status: DC | PRN
  Administered 2016-09-15: 250 mg via INTRAVENOUS

## 2016-09-15 MED ORDER — ROCURONIUM BROMIDE 10 MG/ML (PF) SYRINGE
PREFILLED_SYRINGE | INTRAVENOUS | Status: DC | PRN
  Administered 2016-09-15 (×2): 50 mg via INTRAVENOUS
  Administered 2016-09-15: 80 mg via INTRAVENOUS
  Administered 2016-09-15: 20 mg via INTRAVENOUS

## 2016-09-15 MED ORDER — LACTATED RINGERS IV SOLN
500.0000 mL | Freq: Once | INTRAVENOUS | Status: AC | PRN
  Administered 2016-09-15: 500 mL via INTRAVENOUS

## 2016-09-15 MED ORDER — LACTATED RINGERS IV SOLN: INTRAVENOUS | Status: DC

## 2016-09-15 MED ORDER — ACETAMINOPHEN 160 MG/5ML PO SOLN: 650.0000 mg | Freq: Once | ORAL | Status: AC

## 2016-09-15 MED ORDER — SODIUM CHLORIDE 0.9% FLUSH
3.0000 mL | Freq: Two times a day (BID) | INTRAVENOUS | Status: DC
  Administered 2016-09-16 – 2016-09-18 (×5): 3 mL via INTRAVENOUS

## 2016-09-15 MED ORDER — SODIUM CHLORIDE 0.9 % IV SOLN
INTRAVENOUS | Status: DC
  Administered 2016-09-15: 15:00:00 via INTRAVENOUS

## 2016-09-15 MED ORDER — MIDAZOLAM HCL 5 MG/5ML IJ SOLN
INTRAMUSCULAR | Status: DC | PRN
  Administered 2016-09-15: 1 mg via INTRAVENOUS
  Administered 2016-09-15 (×2): 2 mg via INTRAVENOUS
  Administered 2016-09-15: 1 mg via INTRAVENOUS
  Administered 2016-09-15: 2 mg via INTRAVENOUS

## 2016-09-15 MED ORDER — ACETAMINOPHEN 160 MG/5ML PO SOLN
1000.0000 mg | Freq: Four times a day (QID) | ORAL | Status: DC
  Filled 2016-09-15: qty 40.6

## 2016-09-15 MED ORDER — PROTAMINE SULFATE 10 MG/ML IV SOLN
INTRAVENOUS | Status: AC
  Filled 2016-09-15: qty 25

## 2016-09-15 MED ORDER — DOCUSATE SODIUM 100 MG PO CAPS
200.0000 mg | ORAL_CAPSULE | Freq: Every day | ORAL | Status: DC
  Administered 2016-09-16 – 2016-09-18 (×3): 200 mg via ORAL
  Filled 2016-09-15 (×3): qty 2

## 2016-09-15 MED ORDER — LACTATED RINGERS IV SOLN
INTRAVENOUS | Status: DC | PRN
  Administered 2016-09-15: 08:00:00 via INTRAVENOUS

## 2016-09-15 MED ORDER — FAMOTIDINE IN NACL 20-0.9 MG/50ML-% IV SOLN
20.0000 mg | Freq: Two times a day (BID) | INTRAVENOUS | Status: DC
  Administered 2016-09-15: 20 mg via INTRAVENOUS

## 2016-09-15 MED ORDER — DOPAMINE-DEXTROSE 3.2-5 MG/ML-% IV SOLN: 3.0000 ug/kg/min | INTRAVENOUS | Status: DC

## 2016-09-15 MED ORDER — MAGNESIUM SULFATE 4 GM/100ML IV SOLN
4.0000 g | Freq: Once | INTRAVENOUS | Status: AC
  Administered 2016-09-15: 4 g via INTRAVENOUS
  Filled 2016-09-15: qty 100

## 2016-09-15 MED ORDER — HEPARIN SODIUM (PORCINE) 1000 UNIT/ML IJ SOLN
INTRAMUSCULAR | Status: AC
  Filled 2016-09-15: qty 1

## 2016-09-15 MED ORDER — PROTAMINE SULFATE 10 MG/ML IV SOLN
INTRAVENOUS | Status: DC | PRN
  Administered 2016-09-15: 300 mg via INTRAVENOUS

## 2016-09-15 MED ORDER — ASPIRIN 81 MG PO CHEW: 324.0000 mg | CHEWABLE_TABLET | Freq: Every day | ORAL | Status: DC

## 2016-09-15 MED ORDER — ONDANSETRON HCL 4 MG/2ML IJ SOLN: 4.0000 mg | Freq: Four times a day (QID) | INTRAMUSCULAR | Status: DC | PRN

## 2016-09-15 MED ORDER — BISACODYL 5 MG PO TBEC
10.0000 mg | DELAYED_RELEASE_TABLET | Freq: Every day | ORAL | Status: DC
  Administered 2016-09-16 – 2016-09-18 (×3): 10 mg via ORAL
  Filled 2016-09-15 (×3): qty 2

## 2016-09-15 MED ORDER — SODIUM CHLORIDE 0.9% FLUSH: 3.0000 mL | INTRAVENOUS | Status: DC | PRN

## 2016-09-15 MED ORDER — CHLORHEXIDINE GLUCONATE 0.12 % MT SOLN
15.0000 mL | Freq: Two times a day (BID) | OROMUCOSAL | Status: DC
  Administered 2016-09-15: 15 mL via OROMUCOSAL

## 2016-09-15 MED ORDER — PANTOPRAZOLE SODIUM 40 MG PO TBEC: 40.0000 mg | DELAYED_RELEASE_TABLET | Freq: Every day | ORAL | Status: DC

## 2016-09-15 MED ORDER — MIDAZOLAM HCL 2 MG/2ML IJ SOLN: 2.0000 mg | INTRAMUSCULAR | Status: DC | PRN

## 2016-09-15 MED ORDER — MIDAZOLAM HCL 10 MG/2ML IJ SOLN
INTRAMUSCULAR | Status: AC
  Filled 2016-09-15: qty 2

## 2016-09-15 MED ORDER — METOCLOPRAMIDE HCL 5 MG/ML IJ SOLN
10.0000 mg | Freq: Four times a day (QID) | INTRAMUSCULAR | Status: AC
  Administered 2016-09-15 – 2016-09-16 (×4): 10 mg via INTRAVENOUS
  Filled 2016-09-15 (×3): qty 2

## 2016-09-15 MED ORDER — 0.9 % SODIUM CHLORIDE (POUR BTL) OPTIME
TOPICAL | Status: DC | PRN
  Administered 2016-09-15: 6000 mL

## 2016-09-15 MED ORDER — PROPOFOL 10 MG/ML IV BOLUS
INTRAVENOUS | Status: AC
  Filled 2016-09-15: qty 20

## 2016-09-15 MED ORDER — VANCOMYCIN HCL IN DEXTROSE 1-5 GM/200ML-% IV SOLN
1000.0000 mg | Freq: Once | INTRAVENOUS | Status: AC
  Administered 2016-09-15: 1000 mg via INTRAVENOUS
  Filled 2016-09-15: qty 200

## 2016-09-15 MED ORDER — ACETAMINOPHEN 500 MG PO TABS
1000.0000 mg | ORAL_TABLET | Freq: Four times a day (QID) | ORAL | Status: DC
  Administered 2016-09-15 – 2016-09-18 (×11): 1000 mg via ORAL
  Filled 2016-09-15 (×11): qty 2

## 2016-09-15 MED ORDER — METOPROLOL TARTRATE 12.5 MG HALF TABLET
12.5000 mg | ORAL_TABLET | Freq: Two times a day (BID) | ORAL | Status: DC
  Administered 2016-09-16: 12.5 mg via ORAL
  Filled 2016-09-15: qty 1

## 2016-09-15 MED ORDER — DEXTROSE 5 % IV SOLN
1.5000 g | Freq: Two times a day (BID) | INTRAVENOUS | Status: AC
  Administered 2016-09-15 – 2016-09-16 (×3): 1.5 g via INTRAVENOUS
  Filled 2016-09-15 (×4): qty 1.5

## 2016-09-15 MED ORDER — PHENYLEPHRINE 40 MCG/ML (10ML) SYRINGE FOR IV PUSH (FOR BLOOD PRESSURE SUPPORT)
PREFILLED_SYRINGE | INTRAVENOUS | Status: AC
  Filled 2016-09-15: qty 10

## 2016-09-15 MED ORDER — HEMOSTATIC AGENTS (NO CHARGE) OPTIME
TOPICAL | Status: DC | PRN
  Administered 2016-09-15 (×2): 1 via TOPICAL

## 2016-09-15 MED ORDER — DEXTROSE 50 % IV SOLN
INTRAVENOUS | Status: AC
  Filled 2016-09-15: qty 50

## 2016-09-15 MED ORDER — OXYCODONE HCL 5 MG PO TABS
5.0000 mg | ORAL_TABLET | ORAL | Status: DC | PRN
  Administered 2016-09-15 – 2016-09-18 (×7): 10 mg via ORAL
  Administered 2016-09-18: 5 mg via ORAL
  Filled 2016-09-15 (×2): qty 2
  Filled 2016-09-15: qty 1
  Filled 2016-09-15 (×6): qty 2

## 2016-09-15 MED ORDER — LACTATED RINGERS IV SOLN
INTRAVENOUS | Status: DC | PRN
  Administered 2016-09-15: 07:00:00 via INTRAVENOUS

## 2016-09-15 MED ORDER — INSULIN REGULAR HUMAN 100 UNIT/ML IJ SOLN
INTRAMUSCULAR | Status: DC
  Administered 2016-09-15: 1.6 [IU]/h via INTRAVENOUS
  Filled 2016-09-15: qty 1

## 2016-09-15 MED ORDER — PHENYLEPHRINE HCL 10 MG/ML IJ SOLN
INTRAMUSCULAR | Status: DC | PRN
  Administered 2016-09-15: 80 ug via INTRAVENOUS
  Administered 2016-09-15: 40 ug via INTRAVENOUS
  Administered 2016-09-15 (×5): 80 ug via INTRAVENOUS

## 2016-09-15 MED ORDER — FENTANYL CITRATE (PF) 250 MCG/5ML IJ SOLN
INTRAMUSCULAR | Status: AC
  Filled 2016-09-15: qty 25

## 2016-09-15 MED ORDER — HEPARIN SODIUM (PORCINE) 1000 UNIT/ML IJ SOLN
INTRAMUSCULAR | Status: DC | PRN
  Administered 2016-09-15: 40000 [IU] via INTRAVENOUS

## 2016-09-15 MED ORDER — DEXMEDETOMIDINE HCL 200 MCG/2ML IV SOLN: 0.0000 ug/kg/h | INTRAVENOUS | Status: DC

## 2016-09-15 MED ORDER — PHENYLEPHRINE HCL 10 MG/ML IJ SOLN: 0.0000 ug/min | INTRAMUSCULAR | Status: DC

## 2016-09-15 MED ORDER — PROPOFOL 10 MG/ML IV BOLUS
INTRAVENOUS | Status: DC | PRN
  Administered 2016-09-15: 20 mg via INTRAVENOUS
  Administered 2016-09-15: 50 mg via INTRAVENOUS
  Administered 2016-09-15: 100 mg via INTRAVENOUS
  Administered 2016-09-15: 20 mg via INTRAVENOUS
  Administered 2016-09-15: 30 mg via INTRAVENOUS
  Administered 2016-09-15: 20 mg via INTRAVENOUS
  Administered 2016-09-15: 30 mg via INTRAVENOUS
  Administered 2016-09-15: 20 mg via INTRAVENOUS

## 2016-09-15 MED ORDER — INSULIN REGULAR BOLUS VIA INFUSION
0.0000 [IU] | Freq: Three times a day (TID) | INTRAVENOUS | Status: DC
  Filled 2016-09-15: qty 10

## 2016-09-15 MED ORDER — MORPHINE SULFATE (PF) 4 MG/ML IV SOLN: 1.0000 mg | INTRAVENOUS | Status: DC | PRN

## 2016-09-15 MED ORDER — SODIUM CHLORIDE 0.45 % IV SOLN: INTRAVENOUS | Status: DC | PRN

## 2016-09-15 MED ORDER — ACETAMINOPHEN 650 MG RE SUPP
650.0000 mg | Freq: Once | RECTAL | Status: AC
  Administered 2016-09-15: 650 mg via RECTAL

## 2016-09-15 MED ORDER — METOPROLOL TARTRATE 5 MG/5ML IV SOLN: 2.5000 mg | INTRAVENOUS | Status: DC | PRN

## 2016-09-15 MED ORDER — METOPROLOL TARTRATE 25 MG/10 ML ORAL SUSPENSION: 12.5000 mg | Freq: Two times a day (BID) | ORAL | Status: DC

## 2016-09-15 MED ORDER — SODIUM CHLORIDE 0.9 % IJ SOLN
OROMUCOSAL | Status: DC | PRN
  Administered 2016-09-15 (×3): 4 mL via TOPICAL

## 2016-09-15 MED ORDER — ROCURONIUM BROMIDE 10 MG/ML (PF) SYRINGE
PREFILLED_SYRINGE | INTRAVENOUS | Status: AC
  Filled 2016-09-15: qty 10

## 2016-09-15 MED ORDER — SODIUM CHLORIDE 0.9 % IV SOLN
250.0000 mL | INTRAVENOUS | Status: DC
  Administered 2016-09-16: 250 mL via INTRAVENOUS

## 2016-09-15 MED ORDER — BISACODYL 10 MG RE SUPP: 10.0000 mg | Freq: Every day | RECTAL | Status: DC

## 2016-09-15 MED ORDER — LACTATED RINGERS IV SOLN: INTRAVENOUS | Status: DC | PRN

## 2016-09-15 MED ORDER — ASPIRIN EC 325 MG PO TBEC
325.0000 mg | DELAYED_RELEASE_TABLET | Freq: Every day | ORAL | Status: DC
  Administered 2016-09-16 – 2016-09-18 (×3): 325 mg via ORAL
  Filled 2016-09-15 (×3): qty 1

## 2016-09-15 MED ORDER — ALBUMIN HUMAN 5 % IV SOLN
INTRAVENOUS | Status: DC | PRN
  Administered 2016-09-15 (×2): via INTRAVENOUS

## 2016-09-15 MED FILL — Mannitol IV Soln 20%: INTRAVENOUS | Qty: 500 | Status: AC

## 2016-09-15 MED FILL — Electrolyte-R (PH 7.4) Solution: INTRAVENOUS | Qty: 4000 | Status: AC

## 2016-09-15 MED FILL — Sodium Chloride IV Soln 0.9%: INTRAVENOUS | Qty: 2000 | Status: AC

## 2016-09-15 MED FILL — Potassium Chloride Inj 2 mEq/ML: INTRAVENOUS | Qty: 10 | Status: AC

## 2016-09-15 MED FILL — Lidocaine HCl IV Inj 20 MG/ML: INTRAVENOUS | Qty: 5 | Status: AC

## 2016-09-15 MED FILL — Magnesium Sulfate Inj 50%: INTRAMUSCULAR | Qty: 10 | Status: AC

## 2016-09-15 MED FILL — Heparin Sodium (Porcine) Inj 1000 Unit/ML: INTRAMUSCULAR | Qty: 10 | Status: AC

## 2016-09-15 MED FILL — Heparin Sodium (Porcine) Inj 1000 Unit/ML: INTRAMUSCULAR | Qty: 30 | Status: AC

## 2016-09-15 MED FILL — Sodium Bicarbonate IV Soln 8.4%: INTRAVENOUS | Qty: 50 | Status: AC

## 2016-09-15 SURGICAL SUPPLY — 68 items
ADH SKN CLS APL DERMABOND .7 (GAUZE/BANDAGES/DRESSINGS) ×2
BAG DECANTER FOR FLEXI CONT (MISCELLANEOUS) ×4 IMPLANT
BANDAGE ACE 4X5 VEL STRL LF (GAUZE/BANDAGES/DRESSINGS) ×2 IMPLANT
BANDAGE ACE 6X5 VEL STRL LF (GAUZE/BANDAGES/DRESSINGS) ×2 IMPLANT
BLADE STERNUM SYSTEM 6 (BLADE) ×4 IMPLANT
BNDG GAUZE ELAST 4 BULKY (GAUZE/BANDAGES/DRESSINGS) ×2 IMPLANT
CANISTER SUCT 3000ML PPV (MISCELLANEOUS) ×4 IMPLANT
CATH CPB KIT GERHARDT (MISCELLANEOUS) ×4 IMPLANT
CATH THORACIC 28FR (CATHETERS) ×4 IMPLANT
CRADLE DONUT ADULT HEAD (MISCELLANEOUS) ×4 IMPLANT
DERMABOND ADVANCED (GAUZE/BANDAGES/DRESSINGS) ×2
DERMABOND ADVANCED .7 DNX12 (GAUZE/BANDAGES/DRESSINGS) IMPLANT
DRAIN CHANNEL 28F RND 3/8 FF (WOUND CARE) ×4 IMPLANT
DRAPE CARDIOVASCULAR INCISE (DRAPES) ×4
DRAPE SLUSH/WARMER DISC (DRAPES) ×4 IMPLANT
DRAPE SRG 135X102X78XABS (DRAPES) ×2 IMPLANT
DRSG AQUACEL AG ADV 3.5X 4 (GAUZE/BANDAGES/DRESSINGS) ×2 IMPLANT
ELECT BLADE 4.0 EZ CLEAN MEGAD (MISCELLANEOUS) ×4
ELECT REM PT RETURN 9FT ADLT (ELECTROSURGICAL) ×8
ELECTRODE BLDE 4.0 EZ CLN MEGD (MISCELLANEOUS) ×2 IMPLANT
ELECTRODE REM PT RTRN 9FT ADLT (ELECTROSURGICAL) ×4 IMPLANT
FELT TEFLON 1X6 (MISCELLANEOUS) ×8 IMPLANT
GAUZE SPONGE 4X4 12PLY STRL LF (GAUZE/BANDAGES/DRESSINGS) ×4 IMPLANT
GLOVE BIO SURGEON STRL SZ 6.5 (GLOVE) ×12 IMPLANT
GLOVE BIO SURGEONS STRL SZ 6.5 (GLOVE) ×6
GLOVE BIOGEL PI IND STRL 6.5 (GLOVE) IMPLANT
GLOVE BIOGEL PI INDICATOR 6.5 (GLOVE) ×8
GOWN STRL REUS W/ TWL LRG LVL3 (GOWN DISPOSABLE) ×8 IMPLANT
GOWN STRL REUS W/TWL LRG LVL3 (GOWN DISPOSABLE) ×40
HEMOSTAT POWDER SURGIFOAM 1G (HEMOSTASIS) ×12 IMPLANT
HEMOSTAT SURGICEL 2X14 (HEMOSTASIS) ×4 IMPLANT
IV CATH 18G X1.75 CATHLON (IV SOLUTION) ×2 IMPLANT
KIT BASIN OR (CUSTOM PROCEDURE TRAY) ×4 IMPLANT
KIT CATH SUCT 8FR (CATHETERS) ×4 IMPLANT
KIT ROOM TURNOVER OR (KITS) ×4 IMPLANT
KIT SUCTION CATH 14FR (SUCTIONS) ×8 IMPLANT
KIT VASOVIEW HEMOPRO VH 3000 (KITS) ×4 IMPLANT
LEAD PACING MYOCARDI (MISCELLANEOUS) ×4 IMPLANT
MARKER GRAFT CORONARY BYPASS (MISCELLANEOUS) ×12 IMPLANT
NS IRRIG 1000ML POUR BTL (IV SOLUTION) ×22 IMPLANT
PACK OPEN HEART (CUSTOM PROCEDURE TRAY) ×4 IMPLANT
PAD ARMBOARD 7.5X6 YLW CONV (MISCELLANEOUS) ×8 IMPLANT
PAD ELECT DEFIB RADIOL ZOLL (MISCELLANEOUS) ×4 IMPLANT
PENCIL BUTTON HOLSTER BLD 10FT (ELECTRODE) ×4 IMPLANT
PUNCH AORTIC ROT 4.0MM RCL 40 (MISCELLANEOUS) ×2 IMPLANT
SET CARDIOPLEGIA MPS 5001102 (MISCELLANEOUS) ×2 IMPLANT
SUT BONE WAX W31G (SUTURE) ×4 IMPLANT
SUT MNCRL AB 4-0 PS2 18 (SUTURE) ×2 IMPLANT
SUT PROLENE 3 0 SH1 36 (SUTURE) ×4 IMPLANT
SUT PROLENE 4 0 TF (SUTURE) ×8 IMPLANT
SUT PROLENE 6 0 CC (SUTURE) ×8 IMPLANT
SUT PROLENE 7 0 BV1 MDA (SUTURE) ×4 IMPLANT
SUT PROLENE 7.0 RB 3 (SUTURE) ×4 IMPLANT
SUT PROLENE 8 0 BV175 6 (SUTURE) ×4 IMPLANT
SUT STEEL 6MS V (SUTURE) ×4 IMPLANT
SUT STEEL SZ 6 DBL 3X14 BALL (SUTURE) ×4 IMPLANT
SUT VIC AB 1 CTX 18 (SUTURE) ×8 IMPLANT
SUT VIC AB 2-0 CT1 27 (SUTURE) ×4
SUT VIC AB 2-0 CT1 TAPERPNT 27 (SUTURE) IMPLANT
SUTURE E-PAK OPEN HEART (SUTURE) ×4 IMPLANT
SYSTEM SAHARA CHEST DRAIN ATS (WOUND CARE) ×4 IMPLANT
TAPE CLOTH SURG 4X10 WHT LF (GAUZE/BANDAGES/DRESSINGS) ×2 IMPLANT
TAPE PAPER 2X10 WHT MICROPORE (GAUZE/BANDAGES/DRESSINGS) ×2 IMPLANT
TOWEL GREEN STERILE (TOWEL DISPOSABLE) ×16 IMPLANT
TRAY FOLEY SILVER 16FR TEMP (SET/KITS/TRAYS/PACK) ×4 IMPLANT
TUBING INSUFFLATION (TUBING) ×4 IMPLANT
UNDERPAD 30X30 (UNDERPADS AND DIAPERS) ×4 IMPLANT
WATER STERILE IRR 1000ML POUR (IV SOLUTION) ×8 IMPLANT

## 2016-09-15 NOTE — Progress Notes (Signed)
NIF -30 VC 

## 2016-09-15 NOTE — Brief Op Note (Signed)
09/12/2016 - 09/15/2016  10:57 AM  PATIENT:  Carl Clark  66 y.o. male  PRE-OPERATIVE DIAGNOSIS:  CAD  POST-OPERATIVE DIAGNOSIS:  CAD  PROCEDURE:  Procedure(s):  CORONARY ARTERY BYPASS GRAFTING x 3 -LIMA to LAD -SVG to DIAGONAL -SVG to OM2  ENDOSCOPIC HARVEST GREATER SAPHENOUS VEIN  -Right Leg  TRANSESOPHAGEAL ECHOCARDIOGRAM (TEE) (N/A)  SURGEON:  Surgeon(s) and Role:    * Delight OvensGerhardt, Edward B, MD - Primary  PHYSICIAN ASSISTANT: Lowella DandyErin Maisyn Nouri PA-C  ANESTHESIA:   general  EBL:  Total I/O In: 2400 [I.V.:2400] Out: 375 [Urine:375]  BLOOD ADMINISTERED: CELLSAVER  DRAINS: Left Pleural Chest Tube, Mediastinal Chest Drains   LOCAL MEDICATIONS USED:  NONE  SPECIMEN:  No Specimen  DISPOSITION OF SPECIMEN:  N/A  COUNTS:  YES  TOURNIQUET:  * No tourniquets in log *  DICTATION: .Dragon Dictation  PLAN OF CARE: Admit to inpatient   PATIENT DISPOSITION:  ICU - intubated and hemodynamically stable.   Delay start of Pharmacological VTE agent (>24hrs) due to surgical blood loss or risk of bleeding: yes

## 2016-09-15 NOTE — Progress Notes (Signed)
  Echocardiogram Echocardiogram Transesophageal has been performed.  Arshad Oberholzer T Ashawna Hanback 09/15/2016, 8:55 AM

## 2016-09-15 NOTE — Progress Notes (Signed)
TCTS BRIEF SICU PROGRESS NOTE  Day of Surgery  S/P Procedure(s) (LRB): CORONARY ARTERY BYPASS GRAFTING (CABG) x 3, LIMA to LAD, SVG to DIAGONAL, SVG to OM2, USING LEFT MAMMARY ARTERY AND RIGHT GREATER SAPHENOUS VEIN HARVESTED ENDOSCOPICALLY (N/A) TRANSESOPHAGEAL ECHOCARDIOGRAM (TEE) (N/A)   Starting to wake on vent AV paced w/ stable hemodynamics on dopamine and NTG drips O2 sats 100% Chest tube output low UOP excellent Labs okay  Plan: Continue routine early postop  Carl Nailslarence H Owen, MD 09/15/2016 6:01 PM

## 2016-09-15 NOTE — Progress Notes (Signed)
      [address]       [name] [id number]             [phone]      Pre Procedure note for inpatients:   Carl Clark has been scheduled for Procedure(s): CORONARY ARTERY BYPASS GRAFTING (CABG) (N/A) TRANSESOPHAGEAL ECHOCARDIOGRAM (TEE) (N/A) today. The various methods of treatment have been discussed with the patient. After consideration of the risks, benefits and treatment options the patient has consented to the planned procedure. Interrupter here  this morning   The patient has been seen and labs reviewed. There are no changes in the patient's condition to prevent proceeding with the planned procedure today.  Recent labs:  Lab Results  Component Value Date   WBC 4.5 09/15/2016   HGB 13.5 09/15/2016   HCT 40.6 09/15/2016   PLT 343 09/15/2016   GLUCOSE 419 (H) 09/15/2016   CHOL 192 09/12/2016   TRIG 147 09/12/2016   HDL 40 (L) 09/12/2016   LDLCALC 123 (H) 09/12/2016   ALT 16 (L) 09/14/2016   AST 14 (L) 09/14/2016   NA 131 (L) 09/15/2016   K 3.9 09/15/2016   CL 96 (L) 09/15/2016   CREATININE 1.34 (H) 09/15/2016   BUN 21 (H) 09/15/2016   CO2 27 09/15/2016   TSH 1.064 09/12/2016   INR 0.99 09/14/2016   HGBA1C 9.6 (H) 09/12/2016   MICROALBUR 0.96 05/11/2010   Chronic Kidney Disease   Stage I     GFR >90  Stage II    GFR 60-89  Stage IIIA GFR 45-59  Stage IIIB GFR 30-44  Stage IV   GFR 15-29  Stage V    GFR  <15  Lab Results  Component Value Date   CREATININE 1.34 (H) 09/15/2016   Estimated Creatinine Clearance: 46.7 mL/min (A) (by C-G formula based on SCr of 1.34 mg/dL (H)). Stage IIIA CKD preop  Carl OvensEdward B Josely Moffat, MD 09/15/2016 6:38 AM

## 2016-09-15 NOTE — Transfer of Care (Signed)
Immediate Anesthesia Transfer of Care Note  Patient: Carl Clark  Procedure(s) Performed: Procedure(s): CORONARY ARTERY BYPASS GRAFTING (CABG) x 3, LIMA to LAD, SVG to DIAGONAL, SVG to OM2, USING LEFT MAMMARY ARTERY AND RIGHT GREATER SAPHENOUS VEIN HARVESTED ENDOSCOPICALLY (N/A) TRANSESOPHAGEAL ECHOCARDIOGRAM (TEE) (N/A)  Patient Location: ICU  Anesthesia Type:General  Level of Consciousness: Patient remains intubated per anesthesia plan  Airway & Oxygen Therapy: Patient remains intubated per anesthesia plan and Patient placed on Ventilator (see vital sign flow sheet for setting)  Post-op Assessment: Report given to RN and Post -op Vital signs reviewed and stable  Post vital signs: Reviewed and stable  Last Vitals:  Vitals:   09/15/16 0711 09/15/16 0712  BP:    Pulse: 62 61  Resp:    Temp:      Last Pain:  Vitals:   09/15/16 0428  TempSrc: Oral  PainSc:          Complications: No apparent anesthesia complications

## 2016-09-15 NOTE — Anesthesia Procedure Notes (Addendum)
Arterial Line Insertion Start/End6/28/2018 7:00 AM, 09/15/2016 7:00 AM Performed by: Cecile HearingURK, STEPHEN EDWARD, WHITE, KELSEY TENA WEAVER  Patient location: OOR procedure area. Preanesthetic checklist: patient identified, IV checked, risks and benefits discussed, monitors and equipment checked and timeout performed Lidocaine 1% used for infiltration and patient sedated Left, radial was placed Catheter size: 20 G Hand hygiene performed , maximum sterile barriers used  and Seldinger technique used Allen's test indicative of satisfactory collateral circulation Attempts: 1 Procedure performed without using ultrasound guided technique. Following insertion, Biopatch and dressing applied. Post procedure assessment: normal  Patient tolerated the procedure well with no immediate complications.

## 2016-09-15 NOTE — Anesthesia Procedure Notes (Signed)
Procedure Name: Intubation Date/Time: 09/15/2016 7:38 AM Performed by: Tressia Miners LEFFEW Pre-anesthesia Checklist: Patient identified, Patient being monitored, Timeout performed, Emergency Drugs available and Suction available Patient Re-evaluated:Patient Re-evaluated prior to inductionOxygen Delivery Method: Circle System Utilized Preoxygenation: Pre-oxygenation with 100% oxygen Intubation Type: IV induction Ventilation: Mask ventilation without difficulty Laryngoscope Size: Mac and 4 Grade View: Grade I Tube type: Oral Tube size: 8.0 mm Number of attempts: 1 Airway Equipment and Method: Stylet Placement Confirmation: ETT inserted through vocal cords under direct vision,  positive ETCO2 and breath sounds checked- equal and bilateral Secured at: 23 cm Tube secured with: Tape Dental Injury: Teeth and Oropharynx as per pre-operative assessment

## 2016-09-15 NOTE — Procedures (Signed)
Extubation Procedure Note  Patient Details:   Name: Angeline Slimbdullah Bakheet Kawamoto DOB: 09/10/1950 MRN: [id number]   Airway Documentation:     Evaluation  O2 sats: stable throughout Complications: No apparent complications Patient did tolerate procedure well. Bilateral Breath Sounds: Rhonchi   Yes  Melanee Spryelson, Thedford Bunton Lawson 09/15/2016, 7:06 PM

## 2016-09-15 NOTE — Anesthesia Procedure Notes (Signed)
Central Venous Catheter Insertion Performed by: Cecile HearingURK, Karanvir Balderston EDWARD, anesthesiologist Start/End6/28/2018 7:00 AM, 09/15/2016 7:10 AM Patient location: Pre-op. Preanesthetic checklist: patient identified, IV checked, site marked, risks and benefits discussed, surgical consent, monitors and equipment checked, pre-op evaluation, timeout performed and anesthesia consent Lidocaine 1% used for infiltration and patient sedated Hand hygiene performed  and maximum sterile barriers used  Catheter size: 8.5 Fr Sheath introducer Procedure performed using ultrasound guided technique. Ultrasound Notes:anatomy identified, needle tip was noted to be adjacent to the nerve/plexus identified, no ultrasound evidence of intravascular and/or intraneural injection and image(s) printed for medical record Attempts: 1 Following insertion, line sutured and dressing applied. Post procedure assessment: blood return through all ports, free fluid flow and no air  Patient tolerated the procedure well with no immediate complications.

## 2016-09-15 NOTE — Anesthesia Procedure Notes (Signed)
Central Venous Catheter Insertion Performed by: Cecile HearingURK, STEPHEN EDWARD, anesthesiologist Start/End6/28/2018 7:10 AM, 09/15/2016 7:12 AM Patient location: Pre-op. Preanesthetic checklist: patient identified, IV checked, site marked, risks and benefits discussed, surgical consent, monitors and equipment checked, pre-op evaluation, timeout performed and anesthesia consent Hand hygiene performed  and maximum sterile barriers used  PA cath was placed.Swan type:thermodilution Procedure performed using ultrasound guided technique. Ultrasound Notes:anatomy identified, needle tip was noted to be adjacent to the nerve/plexus identified, no ultrasound evidence of intravascular and/or intraneural injection and image(s) printed for medical record Attempts: 1 Patient tolerated the procedure well with no immediate complications.

## 2016-09-15 NOTE — Anesthesia Procedure Notes (Signed)
Procedures

## 2016-09-16 ENCOUNTER — Inpatient Hospital Stay (HOSPITAL_COMMUNITY): Payer: BLUE CROSS/BLUE SHIELD

## 2016-09-16 ENCOUNTER — Other Ambulatory Visit: Payer: Self-pay

## 2016-09-16 ENCOUNTER — Encounter (HOSPITAL_COMMUNITY): Payer: Self-pay | Admitting: Cardiothoracic Surgery

## 2016-09-16 LAB — CBC
HCT: 28 % — ABNORMAL LOW (ref 39.0–52.0)
HEMATOCRIT: 27.5 % — AB (ref 39.0–52.0)
Hemoglobin: 9.2 g/dL — ABNORMAL LOW (ref 13.0–17.0)
Hemoglobin: 9.2 g/dL — ABNORMAL LOW (ref 13.0–17.0)
MCH: 31.2 pg (ref 26.0–34.0)
MCH: 31.4 pg (ref 26.0–34.0)
MCHC: 33.5 g/dL (ref 30.0–36.0)
MCHC: 32.9 g/dL (ref 30.0–36.0)
MCV: 93.2 fL (ref 78.0–100.0)
MCV: 95.6 fL (ref 78.0–100.0)
Platelets: 151 10*3/uL (ref 150–400)
Platelets: 146 10*3/uL — ABNORMAL LOW (ref 150–400)
RBC: 2.95 MIL/uL — ABNORMAL LOW (ref 4.22–5.81)
RBC: 2.93 MIL/uL — ABNORMAL LOW (ref 4.22–5.81)
RDW: 12.3 % (ref 11.5–15.5)
RDW: 12.7 % (ref 11.5–15.5)
WBC: 9 10*3/uL (ref 4.0–10.5)
WBC: 8.8 10*3/uL (ref 4.0–10.5)

## 2016-09-16 LAB — POCT I-STAT, CHEM 8
BUN: 18 mg/dL (ref 6–20)
CREATININE: 0.8 mg/dL (ref 0.61–1.24)
Calcium, Ion: 1.2 mmol/L (ref 1.15–1.40)
Chloride: 96 mmol/L — ABNORMAL LOW (ref 101–111)
GLUCOSE: 129 mg/dL — AB (ref 65–99)
HEMATOCRIT: 28 % — AB (ref 39.0–52.0)
HEMOGLOBIN: 9.5 g/dL — AB (ref 13.0–17.0)
Potassium: 4.4 mmol/L (ref 3.5–5.1)
Sodium: 135 mmol/L (ref 135–145)
TCO2: 26 mmol/L (ref 0–100)

## 2016-09-16 LAB — GLUCOSE, CAPILLARY
GLUCOSE-CAPILLARY: 103 mg/dL — AB (ref 65–99)
GLUCOSE-CAPILLARY: 103 mg/dL — AB (ref 65–99)
GLUCOSE-CAPILLARY: 112 mg/dL — AB (ref 65–99)
GLUCOSE-CAPILLARY: 112 mg/dL — AB (ref 65–99)
GLUCOSE-CAPILLARY: 134 mg/dL — AB (ref 65–99)
GLUCOSE-CAPILLARY: 135 mg/dL — AB (ref 65–99)
GLUCOSE-CAPILLARY: 145 mg/dL — AB (ref 65–99)
GLUCOSE-CAPILLARY: 87 mg/dL (ref 65–99)
GLUCOSE-CAPILLARY: 96 mg/dL (ref 65–99)
GLUCOSE-CAPILLARY: 98 mg/dL (ref 65–99)
Glucose-Capillary: 104 mg/dL — ABNORMAL HIGH (ref 65–99)
Glucose-Capillary: 95 mg/dL (ref 65–99)
Glucose-Capillary: 118 mg/dL — ABNORMAL HIGH (ref 65–99)
Glucose-Capillary: 99 mg/dL (ref 65–99)
Glucose-Capillary: 117 mg/dL — ABNORMAL HIGH (ref 65–99)
Glucose-Capillary: 137 mg/dL — ABNORMAL HIGH (ref 65–99)
Glucose-Capillary: 145 mg/dL — ABNORMAL HIGH (ref 65–99)

## 2016-09-16 LAB — CREATININE, SERUM
Creatinine, Ser: 0.96 mg/dL (ref 0.61–1.24)
GFR calc Af Amer: >60 mL/min (ref >60)

## 2016-09-16 LAB — BASIC METABOLIC PANEL
Anion gap: 5 (ref 5–15)
BUN: 12 mg/dL (ref 6–20)
CALCIUM: 8.1 mg/dL — AB (ref 8.9–10.3)
CO2: 23 mmol/L (ref 22–32)
CREATININE: 0.9 mg/dL (ref 0.61–1.24)
Chloride: 106 mmol/L (ref 101–111)
GFR calc non Af Amer: >60 mL/min (ref >60)
GLUCOSE: 117 mg/dL — AB (ref 65–99)
Potassium: 4.1 mmol/L (ref 3.5–5.1)
Sodium: 134 mmol/L — ABNORMAL LOW (ref 135–145)

## 2016-09-16 LAB — MAGNESIUM
Magnesium: 1.9 mg/dL (ref 1.7–2.4)
Magnesium: 1.8 mg/dL (ref 1.7–2.4)

## 2016-09-16 MED ORDER — INSULIN DETEMIR 100 UNIT/ML ~~LOC~~ SOLN: 10.0000 [IU] | Freq: Every day | SUBCUTANEOUS | Status: DC

## 2016-09-16 MED ORDER — METOPROLOL TARTRATE 25 MG/10 ML ORAL SUSPENSION
12.5000 mg | Freq: Two times a day (BID) | ORAL | Status: DC
  Filled 2016-09-16: qty 5

## 2016-09-16 MED ORDER — CHLORHEXIDINE GLUCONATE CLOTH 2 % EX PADS: 6.0000 | MEDICATED_PAD | Freq: Every day | CUTANEOUS | Status: DC

## 2016-09-16 MED ORDER — CHLORHEXIDINE GLUCONATE CLOTH 2 % EX PADS
6.0000 | MEDICATED_PAD | Freq: Every day | CUTANEOUS | Status: AC
  Administered 2016-09-16 – 2016-09-18 (×3): 6 via TOPICAL

## 2016-09-16 MED ORDER — METOPROLOL TARTRATE 25 MG PO TABS
25.0000 mg | ORAL_TABLET | Freq: Two times a day (BID) | ORAL | Status: DC
  Administered 2016-09-16 – 2016-09-18 (×5): 25 mg via ORAL
  Filled 2016-09-16 (×5): qty 1

## 2016-09-16 MED ORDER — INSULIN DETEMIR 100 UNIT/ML ~~LOC~~ SOLN
10.0000 [IU] | Freq: Every day | SUBCUTANEOUS | Status: DC
Start: 2016-09-16 — End: 2016-09-19
  Administered 2016-09-16 – 2016-09-19 (×4): 10 [IU] via SUBCUTANEOUS
  Filled 2016-09-16 (×4): qty 0.1

## 2016-09-16 MED ORDER — MUPIROCIN 2 % EX OINT
1.0000 "application " | TOPICAL_OINTMENT | Freq: Two times a day (BID) | CUTANEOUS | Status: AC
  Administered 2016-09-16 – 2016-09-21 (×9): 1 via NASAL
  Filled 2016-09-16 (×3): qty 22

## 2016-09-16 MED ORDER — ORAL CARE MOUTH RINSE: 15.0000 mL | Freq: Two times a day (BID) | OROMUCOSAL | Status: DC

## 2016-09-16 MED ORDER — PANTOPRAZOLE SODIUM 40 MG PO TBEC
40.0000 mg | DELAYED_RELEASE_TABLET | Freq: Every day | ORAL | Status: DC
  Administered 2016-09-16 – 2016-09-18 (×3): 40 mg via ORAL
  Filled 2016-09-16 (×3): qty 1

## 2016-09-16 MED ORDER — INSULIN ASPART 100 UNIT/ML ~~LOC~~ SOLN
0.0000 [IU] | SUBCUTANEOUS | Status: DC
  Administered 2016-09-16 (×2): 2 [IU] via SUBCUTANEOUS
  Administered 2016-09-17: 12 [IU] via SUBCUTANEOUS
  Administered 2016-09-17: 2 [IU] via SUBCUTANEOUS
  Administered 2016-09-18: 4 [IU] via SUBCUTANEOUS
  Administered 2016-09-18: 16 [IU] via SUBCUTANEOUS

## 2016-09-16 MED ORDER — ENOXAPARIN SODIUM 30 MG/0.3ML ~~LOC~~ SOLN
30.0000 mg | Freq: Every day | SUBCUTANEOUS | Status: DC
  Administered 2016-09-16 – 2016-09-17 (×2): 30 mg via SUBCUTANEOUS
  Filled 2016-09-16 (×2): qty 0.3

## 2016-09-16 MED ORDER — ORAL CARE MOUTH RINSE
15.0000 mL | Freq: Two times a day (BID) | OROMUCOSAL | Status: DC
  Administered 2016-09-16 – 2016-09-20 (×7): 15 mL via OROMUCOSAL

## 2016-09-16 NOTE — Progress Notes (Signed)
Patient ID: Carl Clark, male   DOB: [birth year], 66 y.o.   MRN: [id number] TCTS DAILY ICU PROGRESS NOTE                   [address]            [name] [address]          [phone]   1 Day Post-Op Procedure(s) (LRB): CORONARY ARTERY BYPASS GRAFTING (CABG) x 3, LIMA to LAD, SVG to DIAGONAL, SVG to OM2, USING LEFT MAMMARY ARTERY AND RIGHT GREATER SAPHENOUS VEIN HARVESTED ENDOSCOPICALLY (N/A) TRANSESOPHAGEAL ECHOCARDIOGRAM (TEE) (N/A)  Total Length of Stay:  LOS: 4 days   Subjective: Awake and alert neuo intact  Objective: Vital signs in last 24 hours: Temp:  [95.4 F (35.2 C)-100.8 F (38.2 C)] 99 F (37.2 C) (06/29 0715) Pulse Rate:  [77-110] 105 (06/29 0715) Cardiac Rhythm: Sinus tachycardia (06/29 0700) Resp:  [10-33] 15 (06/29 0715) BP: (83-172)/(60-92) 124/73 (06/29 0700) SpO2:  [97 %-100 %] 100 % (06/29 0715) Arterial Line BP: (80-319)/(46-81) 128/46 (06/29 0715) FiO2 (%):  [40 %-50 %] 40 % (06/28 1805) Weight:  [144 lb 13.5 oz (65.7 kg)] 144 lb 13.5 oz (65.7 kg) (06/29 0500)  Filed Weights   09/12/16 1155 09/14/16 0538 09/16/16 0500  Weight: 136 lb 4.8 oz (61.8 kg) 132 lb 8 oz (60.1 kg) 144 lb 13.5 oz (65.7 kg)    Weight change:    Hemodynamic parameters for last 24 hours: PAP: (15-52)/(7-28) 32/14 CO:  [3.2 L/min-5.7 L/min] 4.7 L/min CI:  [1.9 L/min/m2-3.5 L/min/m2] 2.8 L/min/m2  Intake/Output from previous day: 06/28 0701 - 06/29 0700 In: 8265.5 [P.O.:700; I.V.:5527.5; Blood:568; IV Piggyback:1470] Out: 5472 [Urine:4062; Blood:1100; Chest Tube:310]  Intake/Output this shift: No intake/output data recorded.  Current Meds: Scheduled Meds: . acetaminophen  1,000 mg Oral Q6H   Or  . acetaminophen (TYLENOL) oral liquid 160 mg/5 mL  1,000 mg Per Tube Q6H  . aspirin EC  325 mg Oral Daily   Or  . aspirin  324 mg Per Tube Daily  . atorvastatin  80 mg Oral q1800  . bisacodyl  10 mg Oral Daily   Or  . bisacodyl  10 mg Rectal  Daily  . chlorhexidine  15 mL Mouth Rinse BID  . docusate sodium  200 mg Oral Daily  . insulin regular  0-10 Units Intravenous TID WC  . mouth rinse  15 mL Mouth Rinse q12n4p  . metoprolol tartrate  12.5 mg Oral BID   Or  . metoprolol tartrate  12.5 mg Per Tube BID  . [START ON 09/17/2016] pantoprazole  40 mg Oral Daily  . pneumococcal 23 valent vaccine  0.5 mL Intramuscular Tomorrow-1000  . sodium chloride flush  3 mL Intravenous Q12H   Continuous Infusions: . sodium chloride 20 mL/hr at 09/16/16 0700  . sodium chloride 250 mL (09/16/16 0600)  . sodium chloride 10 mL/hr at 09/15/16 2002  . albumin human    . cefUROXime (ZINACEF)  IV Stopped (09/15/16 2023)  . dexmedetomidine (PRECEDEX) IV infusion Stopped (09/15/16 1810)  . DOPamine 2.5 mcg/kg/min (09/16/16 0700)  . insulin (NOVOLIN-R) infusion 1.5 Units/hr (09/16/16 0700)  . lactated ringers 20 mL/hr at 09/15/16 1330  . lactated ringers 20 mL/hr at 09/15/16 1330  . nitroGLYCERIN 10 mcg/min (09/16/16 0700)  . phenylephrine (NEO-SYNEPHRINE) Adult infusion 15 mcg/min (09/15/16 1432)   PRN Meds:.sodium chloride, albumin human, metoprolol tartrate, midazolam, morphine injection, ondansetron (ZOFRAN) IV, oxyCODONE, sodium chloride flush, traMADol  General appearance: alert and cooperative Neurologic: intact Heart: regular rate and rhythm, S1, S2 normal, no murmur, click, rub or gallop Lungs: diminished breath sounds bibasilar Abdomen: soft, non-tender; bowel sounds normal; no masses,  no organomegaly Extremities: extremities normal, atraumatic, no cyanosis or edema and Homans sign is negative, no sign of DVT Wound: sternum intact  Lab Results: CBC: Recent Labs  09/15/16 1945 09/15/16 1955 09/16/16 0438  WBC 10.0  --  9.0  HGB 9.1* 8.5* 9.2*  HCT 26.8* 25.0* 27.5*  PLT 150  --  151   BMET:  Recent Labs  09/15/16 0428  09/15/16 1955 09/16/16 0438  NA 131*  < > 138 134*  K 3.9  < > 3.9 4.1  CL 96*  < > 102 106  CO2 27   --   --  23  GLUCOSE 419*  < > 183* 117*  BUN 21*  < > 13 12  CREATININE 1.34*  < > 0.70 0.90  CALCIUM 9.4  --   --  8.1*  < > = values in this interval not displayed.  CMET: Lab Results  Component Value Date   WBC 9.0 09/16/2016   HGB 9.2 (L) 09/16/2016   HCT 27.5 (L) 09/16/2016   PLT 151 09/16/2016   GLUCOSE 117 (H) 09/16/2016   CHOL 192 09/12/2016   TRIG 147 09/12/2016   HDL 40 (L) 09/12/2016   LDLCALC 123 (H) 09/12/2016   ALT 16 (L) 09/14/2016   AST 14 (L) 09/14/2016   NA 134 (L) 09/16/2016   K 4.1 09/16/2016   CL 106 09/16/2016   CREATININE 0.90 09/16/2016   BUN 12 09/16/2016   CO2 23 09/16/2016   TSH 1.064 09/12/2016   INR 1.57 09/15/2016   HGBA1C 9.6 (H) 09/12/2016   MICROALBUR 0.96 05/11/2010      PT/INR:  Recent Labs  09/15/16 1315  LABPROT 18.9*  INR 1.57   Radiology: Dg Chest Port 1 View  Result Date: 09/15/2016 CLINICAL DATA:  Postoperative film status post CABG. EXAM: PORTABLE CHEST 1 VIEW COMPARISON:  Chest radiograph 09/12/2016 FINDINGS: Status post median sternotomy and coronary artery bypass grafting. Endotracheal tube tip is below the level of the clavicles and approximately 2 cm above the inferior margin of the carina. Right internal jugular vein approach pulmonary arterial catheter overlies the main pulmonary artery. Orogastric tube tip and side port overlie the gastric body. No sizable pneumothorax or pleural effusion. No focal consolidation or pulmonary edema. IMPRESSION: 1. Endotracheal tube tip 2 cm above the inferior margin of the carina. Recommend retraction by 2.5 cm. 2. PA catheter tip overlying the main pulmonary artery. 3. No pulmonary edema. Electronically Signed   By: Deatra RobinsonKevin  Herman M.D.   On: 09/15/2016 14:34     Assessment/Plan: S/P Procedure(s) (LRB): CORONARY ARTERY BYPASS GRAFTING (CABG) x 3, LIMA to LAD, SVG to DIAGONAL, SVG to OM2, USING LEFT MAMMARY ARTERY AND RIGHT GREATER SAPHENOUS VEIN HARVESTED ENDOSCOPICALLY  (N/A) TRANSESOPHAGEAL ECHOCARDIOGRAM (TEE) (N/A) Mobilize Diuresis Diabetes control d/c tubes/lines See progression orders Expected Acute  Blood - loss Anemia Renal function stable    Delight Ovensdward B Diya Gervasi 09/16/2016 7:36 AM

## 2016-09-16 NOTE — Anesthesia Postprocedure Evaluation (Addendum)
Anesthesia Post Note  Patient: Carl Clark  Procedure(s) Performed: Procedure(s) (LRB): CORONARY ARTERY BYPASS GRAFTING (CABG) x 3, LIMA to LAD, SVG to DIAGONAL, SVG to OM2, USING LEFT MAMMARY ARTERY AND RIGHT GREATER SAPHENOUS VEIN HARVESTED ENDOSCOPICALLY (N/A) TRANSESOPHAGEAL ECHOCARDIOGRAM (TEE) (N/A)     Patient location during evaluation: SICU Anesthesia Type: General Level of consciousness: awake and awake and alert Pain management: pain level controlled Vital Signs Assessment: post-procedure vital signs reviewed and stable Respiratory status: spontaneous breathing and nonlabored ventilation Cardiovascular status: stable Anesthetic complications: no Comments: No antiemetics given due to patient remaining intubated post-operatively and transported to ICU.    Last Vitals:  Vitals:   09/16/16 0800 09/16/16 0900  BP: 133/78 117/72  Pulse: (!) 106 99  Resp: 15 14  Temp: 37.3 C 37.4 C    Last Pain:  Vitals:   09/16/16 0800  TempSrc: Oral  PainSc: 5                  Cecile HearingStephen Edward Chazlyn Cude

## 2016-09-16 NOTE — Progress Notes (Signed)
Progress Note  Patient Name: Carl Clark Date of Encounter: 09/16/2016  Primary Cardiologist: New/Sophia Sperry  Subjective   Patient sitting up in chair at bedside. Denies chest discomfort or shortness of breath. Friends are with him in the room.  Inpatient Medications    Scheduled Meds: . acetaminophen  1,000 mg Oral Q6H   Or  . acetaminophen (TYLENOL) oral liquid 160 mg/5 mL  1,000 mg Per Tube Q6H  . aspirin EC  325 mg Oral Daily   Or  . aspirin  324 mg Per Tube Daily  . atorvastatin  80 mg Oral q1800  . bisacodyl  10 mg Oral Daily   Or  . bisacodyl  10 mg Rectal Daily  . docusate sodium  200 mg Oral Daily  . enoxaparin (LOVENOX) injection  30 mg Subcutaneous QHS  . insulin aspart  0-24 Units Subcutaneous Q4H  . insulin detemir  10 Units Subcutaneous Daily  . mouth rinse  15 mL Mouth Rinse q12n4p  . mouth rinse  15 mL Mouth Rinse BID  . metoprolol tartrate  12.5 mg Oral BID   Or  . metoprolol tartrate  12.5 mg Per Tube BID  . pantoprazole  40 mg Oral Daily  . pneumococcal 23 valent vaccine  0.5 mL Intramuscular Tomorrow-1000  . sodium chloride flush  3 mL Intravenous Q12H   Continuous Infusions: . sodium chloride Stopped (09/16/16 1000)  . sodium chloride 250 mL (09/16/16 0600)  . sodium chloride 10 mL/hr at 09/15/16 2002  . albumin human    . cefUROXime (ZINACEF)  IV Stopped (09/16/16 0830)  . dexmedetomidine (PRECEDEX) IV infusion Stopped (09/15/16 1810)  . lactated ringers Stopped (09/16/16 0800)  . lactated ringers 20 mL/hr at 09/15/16 1330  . nitroGLYCERIN 10 mcg/min (09/16/16 0800)  . phenylephrine (NEO-SYNEPHRINE) Adult infusion 15 mcg/min (09/15/16 1432)   PRN Meds: sodium chloride, albumin human, metoprolol tartrate, midazolam, ondansetron (ZOFRAN) IV, oxyCODONE, sodium chloride flush, traMADol   Vital Signs    Vitals:   09/16/16 0900 09/16/16 1000 09/16/16 1100 09/16/16 1200  BP: 117/72 128/85 130/71 132/76  Pulse: 99 100 99 (!) 103    Resp: 14 (!) 23 15 (!) 23  Temp: 99.3 F (37.4 C) 99.3 F (37.4 C)    TempSrc:      SpO2: 99% 99% 98% 95%  Weight:      Height:        Intake/Output Summary (Last 24 hours) at 09/16/16 1256 Last data filed at 09/16/16 1200  Gross per 24 hour  Intake          3808.86 ml  Output             3197 ml  Net           611.86 ml   Filed Weights   09/12/16 1155 09/14/16 0538 09/16/16 0500  Weight: 136 lb 4.8 oz (61.8 kg) 132 lb 8 oz (60.1 kg) 144 lb 13.5 oz (65.7 kg)    Telemetry    Sinus rhythm/sinus tachycardia with PVCs - Personally Reviewed  Physical Exam  Alert, oriented male in no distress GEN: No acute distress.   Neck: No JVD Cardiac: RRR, no murmurs, rubs, or gallops.  Respiratory:  diminished breath sounds in the bases, otherwise clear GI: Soft, nontender, non-distended  MS: No edema; No deformity. Neuro:  Nonfocal  Psych: Normal affect   Labs    Chemistry Recent Labs Lab 09/14/16 0551 09/15/16 0428  09/15/16 1158 09/15/16 1323 09/15/16 1945 09/15/16 1955  09/16/16 0438  NA 136 131*  < > 136 144  --  138 134*  K 3.7 3.9  < > 3.7 3.4*  --  3.9 4.1  CL 102 96*  < > 99*  --   --  102 106  CO2 26 27  --   --   --   --   --  23  GLUCOSE 258* 419*  < > 199* 108*  --  183* 117*  BUN 21* 21*  < > 16  --   --  13 12  CREATININE 1.04 1.34*  < > 0.60*  --  0.81 0.70 0.90  CALCIUM 9.1 9.4  --   --   --   --   --  8.1*  PROT 6.9  --   --   --   --   --   --   --   ALBUMIN 3.1*  --   --   --   --   --   --   --   AST 14*  --   --   --   --   --   --   --   ALT 16*  --   --   --   --   --   --   --   ALKPHOS 56  --   --   --   --   --   --   --   BILITOT 0.8  --   --   --   --   --   --   --   GFRNONAA >60 54*  --   --   --  >60  --  >60  GFRAA >60 >60  --   --   --  >60  --  >60  ANIONGAP 8 8  --   --   --   --   --  5  < > = values in this interval not displayed.   Hematology Recent Labs Lab 09/15/16 1315  09/15/16 1945 09/15/16 1955 09/16/16 0438  WBC  7.5  --  10.0  --  9.0  RBC 2.97*  --  2.87*  --  2.95*  HGB 9.3*  < > 9.1* 8.5* 9.2*  HCT 27.8*  < > 26.8* 25.0* 27.5*  MCV 93.6  --  93.4  --  93.2  MCH 31.3  --  31.7  --  31.2  MCHC 33.5  --  34.0  --  33.5  RDW 12.3  --  12.3  --  12.3  PLT 136*  --  150  --  151  < > = values in this interval not displayed.  Cardiac Enzymes Recent Labs Lab 09/12/16 0621  TROPONINI 1.54*    Recent Labs Lab 09/12/16 0628  TROPIPOC 2.14*     BNPNo results for input(s): BNP, PROBNP in the last 168 hours.   DDimer No results for input(s): DDIMER in the last 168 hours.   Radiology    Dg Chest Port 1 View  Result Date: 09/16/2016 CLINICAL DATA:  CABG EXAM: PORTABLE CHEST 1 VIEW COMPARISON:  09/15/2016 FINDINGS: Endotracheal tube and NG tube removed. Swan-Ganz catheter remains in the right main pulmonary artery. Left chest tube in place. No pneumothorax Decreased lung volume compared with yesterday. Increase in mild bibasilar atelectasis. Minimal pleural effusion. IMPRESSION: Increased bibasilar atelectasis following extubation. Negative for pneumothorax. Electronically Signed   By: Marlan Palau M.D.   On: 09/16/2016 07:49  Dg Chest Port 1 View  Result Date: 09/15/2016 CLINICAL DATA:  Postoperative film status post CABG. EXAM: PORTABLE CHEST 1 VIEW COMPARISON:  Chest radiograph 09/12/2016 FINDINGS: Status post median sternotomy and coronary artery bypass grafting. Endotracheal tube tip is below the level of the clavicles and approximately 2 cm above the inferior margin of the carina. Right internal jugular vein approach pulmonary arterial catheter overlies the main pulmonary artery. Orogastric tube tip and side port overlie the gastric body. No sizable pneumothorax or pleural effusion. No focal consolidation or pulmonary edema. IMPRESSION: 1. Endotracheal tube tip 2 cm above the inferior margin of the carina. Recommend retraction by 2.5 cm. 2. PA catheter tip overlying the main pulmonary artery.  3. No pulmonary edema. Electronically Signed   By: Deatra RobinsonKevin  Herman M.D.   On: 09/15/2016 14:34     Patient Profile     66 y.o. male presented with non-ST elevation infarction and severe multivessel coronary artery disease 09/12/2016, underwent multivessel CABG 6/28  Assessment & Plan    1. Non-ST elevation infarction: Doing well with no early complications from multivessel CABG. Continue progression per TC TS service  2. Type 2 diabetes: Continues on an insulin drip  3. Hyperlipidemia: Continue high-dose atorvastatin  4. Postoperative blood loss anemia: Expected, stable  The patient appears stable. He is having some ectopy. Will increase his beta blocker to metoprolol 25 mg twice a day. Appreciate care of the cardiac surgical team.  Signed, Tonny Bollmanooper, Kasy Iannacone, MD  09/16/2016, 12:56 PM

## 2016-09-16 NOTE — Progress Notes (Signed)
Dr. Tyrone SageGerhardt rounding.  Notified of urine output 15-20 cc per hour.  Labs reviewed.  Will continue to monitor pt closely.

## 2016-09-17 ENCOUNTER — Inpatient Hospital Stay (HOSPITAL_COMMUNITY): Payer: BLUE CROSS/BLUE SHIELD

## 2016-09-17 DIAGNOSIS — E782 Mixed hyperlipidemia: Secondary | ICD-10-CM

## 2016-09-17 DIAGNOSIS — I25119 Atherosclerotic heart disease of native coronary artery with unspecified angina pectoris: Secondary | ICD-10-CM

## 2016-09-17 LAB — BASIC METABOLIC PANEL
Anion gap: 4 — ABNORMAL LOW (ref 5–15)
BUN: 14 mg/dL (ref 6–20)
CO2: 26 mmol/L (ref 22–32)
Calcium: 8.3 mg/dL — ABNORMAL LOW (ref 8.9–10.3)
Chloride: 100 mmol/L — ABNORMAL LOW (ref 101–111)
Creatinine, Ser: 0.91 mg/dL (ref 0.61–1.24)
GFR calc Af Amer: >60 mL/min (ref >60)
GFR calc non Af Amer: >60 mL/min (ref >60)
Glucose, Bld: 79 mg/dL (ref 65–99)
Potassium: 3.9 mmol/L (ref 3.5–5.1)
Sodium: 130 mmol/L — ABNORMAL LOW (ref 135–145)

## 2016-09-17 LAB — GLUCOSE, CAPILLARY
GLUCOSE-CAPILLARY: 116 mg/dL — AB (ref 65–99)
GLUCOSE-CAPILLARY: 69 mg/dL (ref 65–99)
GLUCOSE-CAPILLARY: 103 mg/dL — AB (ref 65–99)
Glucose-Capillary: 70 mg/dL (ref 65–99)
Glucose-Capillary: 259 mg/dL — ABNORMAL HIGH (ref 65–99)
Glucose-Capillary: 143 mg/dL — ABNORMAL HIGH (ref 65–99)

## 2016-09-17 LAB — CBC
HCT: 27.6 % — ABNORMAL LOW (ref 39.0–52.0)
Hemoglobin: 9 g/dL — ABNORMAL LOW (ref 13.0–17.0)
MCH: 31 pg (ref 26.0–34.0)
MCHC: 32.6 g/dL (ref 30.0–36.0)
MCV: 95.2 fL (ref 78.0–100.0)
Platelets: 148 10*3/uL — ABNORMAL LOW (ref 150–400)
RBC: 2.9 MIL/uL — ABNORMAL LOW (ref 4.22–5.81)
RDW: 12.6 % (ref 11.5–15.5)
WBC: 9.2 10*3/uL (ref 4.0–10.5)

## 2016-09-17 MED ORDER — DIPHENHYDRAMINE HCL 25 MG PO CAPS
50.0000 mg | ORAL_CAPSULE | Freq: Once | ORAL | Status: AC
  Administered 2016-09-18: 50 mg via ORAL
  Filled 2016-09-17: qty 2

## 2016-09-17 MED ORDER — FUROSEMIDE 10 MG/ML IJ SOLN
40.0000 mg | Freq: Once | INTRAMUSCULAR | Status: AC
  Administered 2016-09-17: 40 mg via INTRAVENOUS
  Filled 2016-09-17: qty 4

## 2016-09-17 MED ORDER — POTASSIUM CHLORIDE CRYS ER 20 MEQ PO TBCR
40.0000 meq | EXTENDED_RELEASE_TABLET | Freq: Once | ORAL | Status: AC
  Administered 2016-09-17: 40 meq via ORAL
  Filled 2016-09-17: qty 2

## 2016-09-17 NOTE — Progress Notes (Signed)
2 Days Post-Op Procedure(s) (LRB): CORONARY ARTERY BYPASS GRAFTING (CABG) x 3, LIMA to LAD, SVG to DIAGONAL, SVG to OM2, USING LEFT MAMMARY ARTERY AND RIGHT GREATER SAPHENOUS VEIN HARVESTED ENDOSCOPICALLY (N/A) TRANSESOPHAGEAL ECHOCARDIOGRAM (TEE) (N/A) Subjective:   No complaints  Objective: Vital signs in last 24 hours: Temp:  [98.3 F (36.8 C)-99.3 F (37.4 C)] 98.6 F (37 C) (06/30 1621) Pulse Rate:  [70-85] 74 (06/30 1600) Cardiac Rhythm: Normal sinus rhythm (06/30 1600) Resp:  [9-23] 15 (06/30 1600) BP: (106-135)/(52-69) 129/67 (06/30 1600) SpO2:  [91 %-100 %] 97 % (06/30 1600) Weight:  [67.7 kg (149 lb 4 oz)] 67.7 kg (149 lb 4 oz) (06/30 0529)  Hemodynamic parameters for last 24 hours:    Intake/Output from previous day: 06/29 0701 - 06/30 0700 In: 614.8 [P.O.:450; I.V.:64.8; IV Piggyback:100] Out: 1455 [Urine:1365; Chest Tube:90] Intake/Output this shift: Total I/O In: 600 [P.O.:520; I.V.:80] Out: 1395 [Urine:1325; Chest Tube:70]  General appearance: alert and cooperative Heart: regular rate and rhythm, S1, S2 normal, no murmur, click, rub or gallop Lungs: clear to auscultation bilaterally Extremities: edema mild Wound: incisions ok  Lab Results:  Recent Labs  09/16/16 1720 09/16/16 1734 09/17/16 0419  WBC 8.8  --  9.2  HGB 9.2* 9.5* 9.0*  HCT 28.0* 28.0* 27.6*  PLT 146*  --  148*   BMET:  Recent Labs  09/16/16 0438  09/16/16 1734 09/17/16 0419  NA 134*  --  135 130*  K 4.1  --  4.4 3.9  CL 106  --  96* 100*  CO2 23  --   --  26  GLUCOSE 117*  --  129* 79  BUN 12  --  18 14  CREATININE 0.90  < > 0.80 0.91  CALCIUM 8.1*  --   --  8.3*  < > = values in this interval not displayed.  PT/INR:  Recent Labs  09/15/16 1315  LABPROT 18.9*  INR 1.57   ABG    Component Value Date/Time   PHART 7.386 09/15/2016 1951   HCO3 24.0 09/15/2016 1951   TCO2 26 09/16/2016 1734   ACIDBASEDEF 1.0 09/15/2016 1951   O2SAT 99.0 09/15/2016 1951   CBG  (last 3)   Recent Labs  09/17/16 0404 09/17/16 0817 09/17/16 1249  GLUCAP 70 116* 259*   CLINICAL DATA:  Postop bypass surgery.  EXAM: PORTABLE CHEST 1 VIEW  COMPARISON:  09/16/2016  FINDINGS: The Swan-Ganz catheter has been removed. The right IJ Cordis is still in place. Stable left-sided chest tube without definite pneumothorax. Slightly low lung volumes with streaky basilar atelectasis but no definite effusions or edema.  IMPRESSION: Removal of the Swan-Ganz catheter.  Low lung volumes with streaky basilar atelectasis but no pneumothorax, edema or effusion.   Electronically Signed   By: Rudie MeyerP.  Gallerani M.D.   On: 09/17/2016 10:00   Assessment/Plan: S/P Procedure(s) (LRB): CORONARY ARTERY BYPASS GRAFTING (CABG) x 3, LIMA to LAD, SVG to DIAGONAL, SVG to OM2, USING LEFT MAMMARY ARTERY AND RIGHT GREATER SAPHENOUS VEIN HARVESTED ENDOSCOPICALLY (N/A) TRANSESOPHAGEAL ECHOCARDIOGRAM (TEE) (N/A)  He is hemodynamically stable in sinus rhythm  Volume excess: wt is 17 lbs over preop if accurate. Continue diuresis.  IS, ambulation  DC chest tubes   LOS: 5 days    Alleen BorneBryan K Bartle 09/17/2016

## 2016-09-17 NOTE — Progress Notes (Signed)
Progress Note  Patient Name: Carl Clark Date of Encounter: 09/17/2016  Primary Cardiologist: Dr. Tonny BollmanMichael Cooper  Subjective   Sitting in bed side chair. States that he has walked around with assistance. No shortness of breath at rest.  Inpatient Medications    Scheduled Meds: . acetaminophen  1,000 mg Oral Q6H   Or  . acetaminophen (TYLENOL) oral liquid 160 mg/5 mL  1,000 mg Per Tube Q6H  . aspirin EC  325 mg Oral Daily   Or  . aspirin  324 mg Per Tube Daily  . atorvastatin  80 mg Oral q1800  . bisacodyl  10 mg Oral Daily   Or  . bisacodyl  10 mg Rectal Daily  . Chlorhexidine Gluconate Cloth  6 each Topical Daily  . docusate sodium  200 mg Oral Daily  . enoxaparin (LOVENOX) injection  30 mg Subcutaneous QHS  . insulin aspart  0-24 Units Subcutaneous Q4H  . insulin detemir  10 Units Subcutaneous Daily  . mouth rinse  15 mL Mouth Rinse BID  . metoprolol tartrate  25 mg Oral BID   Or  . metoprolol tartrate  12.5 mg Per Tube BID  . mupirocin ointment  1 application Nasal BID  . pantoprazole  40 mg Oral Daily  . pneumococcal 23 valent vaccine  0.5 mL Intramuscular Tomorrow-1000  . sodium chloride flush  3 mL Intravenous Q12H   Continuous Infusions: . sodium chloride 250 mL (09/16/16 0600)  . cefUROXime (ZINACEF)  IV Stopped (09/16/16 2131)  . lactated ringers Stopped (09/16/16 0800)  . lactated ringers 20 mL/hr at 09/15/16 1330  . nitroGLYCERIN Stopped (09/16/16 1315)  . phenylephrine (NEO-SYNEPHRINE) Adult infusion 15 mcg/min (09/15/16 1432)   PRN Meds: metoprolol tartrate, midazolam, ondansetron (ZOFRAN) IV, oxyCODONE, sodium chloride flush, traMADol   Vital Signs    Vitals:   09/17/16 0529 09/17/16 0600 09/17/16 0700 09/17/16 0737  BP:  (!) 108/58 112/61   Pulse:  76 80 84  Resp:  12 13 16   Temp:      TempSrc:      SpO2:  94% 97% 91%  Weight: 149 lb 4 oz (67.7 kg)     Height:        Intake/Output Summary (Last 24 hours) at 09/17/16  0754 Last data filed at 09/17/16 0700  Gross per 24 hour  Intake           614.84 ml  Output             1455 ml  Net          -840.16 ml   Filed Weights   09/14/16 0538 09/16/16 0500 09/17/16 0529  Weight: 132 lb 8 oz (60.1 kg) 144 lb 13.5 oz (65.7 kg) 149 lb 4 oz (67.7 kg)    Telemetry    Sinus rhythm. Personally reviewed.  ECG    Tracing from 09/16/2016 shows sinus rhythm with decreased anterior R-wave progression. Personally reviewed.  Physical Exam   GEN: No acute distress.   Neck: No JVD. Cardiac: RRR, no gallop.  Thorax: Dressed sternal incision stable. Respiratory: Nonlabored. Clear to auscultation bilaterally. GI: Soft, nontender, bowel sounds present. MS: No edema; No deformity.  Labs    Chemistry Recent Labs Lab 09/14/16 0551 09/15/16 0428  09/16/16 0438 09/16/16 1720 09/16/16 1734 09/17/16 0419  NA 136 131*  < > 134*  --  135 130*  K 3.7 3.9  < > 4.1  --  4.4 3.9  CL 102 96*  < >  106  --  96* 100*  CO2 26 27  --  23  --   --  26  GLUCOSE 258* 419*  < > 117*  --  129* 79  BUN 21* 21*  < > 12  --  18 14  CREATININE 1.04 1.34*  < > 0.90 0.96 0.80 0.91  CALCIUM 9.1 9.4  --  8.1*  --   --  8.3*  PROT 6.9  --   --   --   --   --   --   ALBUMIN 3.1*  --   --   --   --   --   --   AST 14*  --   --   --   --   --   --   ALT 16*  --   --   --   --   --   --   ALKPHOS 56  --   --   --   --   --   --   BILITOT 0.8  --   --   --   --   --   --   GFRNONAA >60 54*  < > >60 >60  --  >60  GFRAA >60 >60  < > >60 >60  --  >60  ANIONGAP 8 8  --  5  --   --  4*  < > = values in this interval not displayed.   Hematology Recent Labs Lab 09/16/16 0438 09/16/16 1720 09/16/16 1734 09/17/16 0419  WBC 9.0 8.8  --  9.2  RBC 2.95* 2.93*  --  2.90*  HGB 9.2* 9.2* 9.5* 9.0*  HCT 27.5* 28.0* 28.0* 27.6*  MCV 93.2 95.6  --  95.2  MCH 31.2 31.4  --  31.0  MCHC 33.5 32.9  --  32.6  RDW 12.3 12.7  --  12.6  PLT 151 146*  --  148*    Cardiac Enzymes Recent  Labs Lab 09/12/16 0621  TROPONINI 1.54*    Recent Labs Lab 09/12/16 0628  TROPIPOC 2.14*     Radiology    Dg Chest Port 1 View  Result Date: 09/16/2016 CLINICAL DATA:  CABG EXAM: PORTABLE CHEST 1 VIEW COMPARISON:  09/15/2016 FINDINGS: Endotracheal tube and NG tube removed. Swan-Ganz catheter remains in the right main pulmonary artery. Left chest tube in place. No pneumothorax Decreased lung volume compared with yesterday. Increase in mild bibasilar atelectasis. Minimal pleural effusion. IMPRESSION: Increased bibasilar atelectasis following extubation. Negative for pneumothorax. Electronically Signed   By: Marlan Palau M.D.   On: 09/16/2016 07:49   Dg Chest Port 1 View  Result Date: 09/15/2016 CLINICAL DATA:  Postoperative film status post CABG. EXAM: PORTABLE CHEST 1 VIEW COMPARISON:  Chest radiograph 09/12/2016 FINDINGS: Status post median sternotomy and coronary artery bypass grafting. Endotracheal tube tip is below the level of the clavicles and approximately 2 cm above the inferior margin of the carina. Right internal jugular vein approach pulmonary arterial catheter overlies the main pulmonary artery. Orogastric tube tip and side port overlie the gastric body. No sizable pneumothorax or pleural effusion. No focal consolidation or pulmonary edema. IMPRESSION: 1. Endotracheal tube tip 2 cm above the inferior margin of the carina. Recommend retraction by 2.5 cm. 2. PA catheter tip overlying the main pulmonary artery. 3. No pulmonary edema. Electronically Signed   By: Deatra Robinson M.D.   On: 09/15/2016 14:34    Cardiac Studies   Intraoperative TEE 09/15/2016:  Left ventricle:  Normal cavity size. Concentric hypertrophy of moderate severity. LV systolic function is normal with an EF of 60-65%. There are no obvious wall motion abnormalities.  Aortic valve: The valve is trileaflet. No stenosis. No regurgitation.  Right ventricle: Normal cavity size, wall thickness and ejection  fraction.  Tricuspid valve: Trace regurgitation. The tricuspid valve regurgitation jet is central.  Cardiac catheterization 09/12/2016: 1. Severe proximal LAD stenosis  2. Severe proximal LCx stenosis 3. Mild-moderate RCA stenosis 4. Preserved LV systolic function with normal LVEF (>65%) and normal LVEDP  Patient Profile     66 y.o. male with hyperlipidemia, type 2 diabetes mellitus, and multivessel CAD status post NSTEMI. He underwent CABG on June 28.  Assessment & Plan    1. NSTEMI, No recurrent angina now status post revascularization with CABG on June 28.  2. Multivessel CAD, LVEF 60-65%.  3. Hyperlipidemia, on high-dose Lipitor. Baseline LDL 123.  4. Postoperative blood loss anemia, hemoglobin 9.0.  Continue aspirin, Lipitor, and Lopressor. Was on Prinzide as an outpatient, may be able to add back at low dose depending on blood pressure. Continue progression per TCTS service, can likely be transferred to telemetry.  Signed, Nona Dell, MD  09/17/2016, 7:54 AM

## 2016-09-18 ENCOUNTER — Inpatient Hospital Stay (HOSPITAL_COMMUNITY): Payer: BLUE CROSS/BLUE SHIELD

## 2016-09-18 DIAGNOSIS — I25110 Atherosclerotic heart disease of native coronary artery with unstable angina pectoris: Secondary | ICD-10-CM

## 2016-09-18 LAB — TYPE AND SCREEN
ABO/RH(D): O POS
Antibody Screen: NEGATIVE
Unit division: 0
Unit division: 0
Unit division: 0
Unit division: 0

## 2016-09-18 LAB — BPAM RBC
Blood Product Expiration Date: 201807242359
Blood Product Expiration Date: 201807242359
Blood Product Expiration Date: 201807242359
Blood Product Expiration Date: 201807242359
ISSUE DATE / TIME: 201806280812
ISSUE DATE / TIME: 201806302154
Unit Type and Rh: 5100
Unit Type and Rh: 5100
Unit Type and Rh: 5100
Unit Type and Rh: 5100

## 2016-09-18 LAB — CBC
HEMATOCRIT: 27.2 % — AB (ref 39.0–52.0)
HEMOGLOBIN: 8.9 g/dL — AB (ref 13.0–17.0)
MCH: 30.9 pg (ref 26.0–34.0)
MCHC: 32.7 g/dL (ref 30.0–36.0)
MCV: 94.4 fL (ref 78.0–100.0)
Platelets: 163 10*3/uL (ref 150–400)
RBC: 2.88 MIL/uL — AB (ref 4.22–5.81)
RDW: 12.4 % (ref 11.5–15.5)
WBC: 6.3 10*3/uL (ref 4.0–10.5)

## 2016-09-18 LAB — GLUCOSE, CAPILLARY
GLUCOSE-CAPILLARY: 146 mg/dL — AB (ref 65–99)
GLUCOSE-CAPILLARY: 170 mg/dL — AB (ref 65–99)
GLUCOSE-CAPILLARY: 305 mg/dL — AB (ref 65–99)
GLUCOSE-CAPILLARY: 76 mg/dL (ref 65–99)
Glucose-Capillary: 89 mg/dL (ref 65–99)
Glucose-Capillary: 65 mg/dL (ref 65–99)

## 2016-09-18 LAB — BASIC METABOLIC PANEL
ANION GAP: 4 — AB (ref 5–15)
BUN: 13 mg/dL (ref 6–20)
CHLORIDE: 101 mmol/L (ref 101–111)
CO2: 30 mmol/L (ref 22–32)
Calcium: 8.5 mg/dL — ABNORMAL LOW (ref 8.9–10.3)
Creatinine, Ser: 0.99 mg/dL (ref 0.61–1.24)
GFR calc non Af Amer: >60 mL/min (ref >60)
GLUCOSE: 102 mg/dL — AB (ref 65–99)
POTASSIUM: 3.8 mmol/L (ref 3.5–5.1)
Sodium: 135 mmol/L (ref 135–145)

## 2016-09-18 MED ORDER — ACETAMINOPHEN 325 MG PO TABS
650.0000 mg | ORAL_TABLET | Freq: Four times a day (QID) | ORAL | Status: DC | PRN
  Administered 2016-09-19 (×2): 650 mg via ORAL
  Filled 2016-09-18 (×2): qty 2

## 2016-09-18 MED ORDER — PANTOPRAZOLE SODIUM 40 MG PO TBEC
40.0000 mg | DELAYED_RELEASE_TABLET | Freq: Every day | ORAL | Status: DC
  Administered 2016-09-19 – 2016-09-23 (×4): 40 mg via ORAL
  Filled 2016-09-18 (×5): qty 1

## 2016-09-18 MED ORDER — ASPIRIN EC 325 MG PO TBEC
325.0000 mg | DELAYED_RELEASE_TABLET | Freq: Every day | ORAL | Status: DC
  Administered 2016-09-18 – 2016-09-23 (×6): 325 mg via ORAL
  Filled 2016-09-18 (×6): qty 1

## 2016-09-18 MED ORDER — ONDANSETRON HCL 4 MG PO TABS
4.0000 mg | ORAL_TABLET | Freq: Four times a day (QID) | ORAL | Status: DC | PRN
  Administered 2016-09-21: 4 mg via ORAL
  Filled 2016-09-18: qty 1

## 2016-09-18 MED ORDER — BISACODYL 5 MG PO TBEC
10.0000 mg | DELAYED_RELEASE_TABLET | Freq: Every day | ORAL | Status: DC | PRN
  Administered 2016-09-19 – 2016-09-22 (×3): 10 mg via ORAL
  Filled 2016-09-18 (×3): qty 2

## 2016-09-18 MED ORDER — OXYCODONE HCL 5 MG PO TABS
5.0000 mg | ORAL_TABLET | ORAL | Status: DC | PRN
  Administered 2016-09-18 – 2016-09-23 (×13): 10 mg via ORAL
  Filled 2016-09-18 (×14): qty 2

## 2016-09-18 MED ORDER — FUROSEMIDE 40 MG PO TABS
40.0000 mg | ORAL_TABLET | Freq: Every day | ORAL | Status: AC
  Administered 2016-09-18 – 2016-09-19 (×2): 40 mg via ORAL
  Filled 2016-09-18 (×2): qty 1

## 2016-09-18 MED ORDER — MOVING RIGHT ALONG BOOK
Freq: Once | Status: AC
  Administered 2016-09-18: 16:00:00
  Filled 2016-09-18: qty 1

## 2016-09-18 MED ORDER — DOCUSATE SODIUM 100 MG PO CAPS
200.0000 mg | ORAL_CAPSULE | Freq: Every day | ORAL | Status: DC
  Administered 2016-09-18 – 2016-09-23 (×6): 200 mg via ORAL
  Filled 2016-09-18 (×6): qty 2

## 2016-09-18 MED ORDER — SODIUM CHLORIDE 0.9 % IV SOLN: 250.0000 mL | INTRAVENOUS | Status: DC | PRN

## 2016-09-18 MED ORDER — POTASSIUM CHLORIDE CRYS ER 20 MEQ PO TBCR
20.0000 meq | EXTENDED_RELEASE_TABLET | Freq: Two times a day (BID) | ORAL | Status: AC
  Administered 2016-09-18 – 2016-09-20 (×4): 20 meq via ORAL
  Filled 2016-09-18 (×5): qty 1

## 2016-09-18 MED ORDER — METFORMIN HCL 500 MG PO TABS
1000.0000 mg | ORAL_TABLET | Freq: Two times a day (BID) | ORAL | Status: DC
  Administered 2016-09-18 – 2016-09-23 (×9): 1000 mg via ORAL
  Filled 2016-09-18 (×10): qty 2

## 2016-09-18 MED ORDER — ONDANSETRON HCL 4 MG/2ML IJ SOLN: 4.0000 mg | Freq: Four times a day (QID) | INTRAMUSCULAR | Status: DC | PRN

## 2016-09-18 MED ORDER — TRAMADOL HCL 50 MG PO TABS
50.0000 mg | ORAL_TABLET | ORAL | Status: DC | PRN
  Administered 2016-09-18 – 2016-09-20 (×2): 100 mg via ORAL
  Filled 2016-09-18 (×2): qty 2

## 2016-09-18 MED ORDER — METOPROLOL TARTRATE 25 MG PO TABS
25.0000 mg | ORAL_TABLET | Freq: Two times a day (BID) | ORAL | Status: DC
  Administered 2016-09-18 – 2016-09-23 (×10): 25 mg via ORAL
  Filled 2016-09-18 (×10): qty 1

## 2016-09-18 MED ORDER — SODIUM CHLORIDE 0.9% FLUSH: 3.0000 mL | INTRAVENOUS | Status: DC | PRN

## 2016-09-18 MED ORDER — SODIUM CHLORIDE 0.9% FLUSH
3.0000 mL | Freq: Two times a day (BID) | INTRAVENOUS | Status: DC
  Administered 2016-09-18 – 2016-09-22 (×8): 3 mL via INTRAVENOUS

## 2016-09-18 MED ORDER — INSULIN ASPART 100 UNIT/ML ~~LOC~~ SOLN
0.0000 [IU] | Freq: Three times a day (TID) | SUBCUTANEOUS | Status: DC
  Administered 2016-09-18 – 2016-09-19 (×2): 2 [IU] via SUBCUTANEOUS
  Administered 2016-09-19: 12 [IU] via SUBCUTANEOUS
  Administered 2016-09-19: 2 [IU] via SUBCUTANEOUS
  Administered 2016-09-20: 12 [IU] via SUBCUTANEOUS
  Administered 2016-09-20: 4 [IU] via SUBCUTANEOUS
  Administered 2016-09-20: 8 [IU] via SUBCUTANEOUS
  Administered 2016-09-21 (×2): 4 [IU] via SUBCUTANEOUS
  Administered 2016-09-21 – 2016-09-22 (×2): 2 [IU] via SUBCUTANEOUS
  Administered 2016-09-22 (×2): 8 [IU] via SUBCUTANEOUS

## 2016-09-18 MED ORDER — BISACODYL 10 MG RE SUPP: 10.0000 mg | Freq: Every day | RECTAL | Status: DC | PRN

## 2016-09-18 NOTE — Progress Notes (Signed)
Progress Note  Patient Name: Carl Clark Date of Encounter: 09/18/2016  Primary Cardiologist: Dr. Tonny Bollman  Subjective   Chest soreness. Has ambulated this morning. No dyspnea at rest.  Inpatient Medications    Scheduled Meds: . acetaminophen  1,000 mg Oral Q6H   Or  . acetaminophen (TYLENOL) oral liquid 160 mg/5 mL  1,000 mg Per Tube Q6H  . aspirin EC  325 mg Oral Daily   Or  . aspirin  324 mg Per Tube Daily  . atorvastatin  80 mg Oral q1800  . bisacodyl  10 mg Oral Daily   Or  . bisacodyl  10 mg Rectal Daily  . Chlorhexidine Gluconate Cloth  6 each Topical Daily  . docusate sodium  200 mg Oral Daily  . enoxaparin (LOVENOX) injection  30 mg Subcutaneous QHS  . insulin aspart  0-24 Units Subcutaneous Q4H  . insulin detemir  10 Units Subcutaneous Daily  . mouth rinse  15 mL Mouth Rinse BID  . metoprolol tartrate  25 mg Oral BID   Or  . metoprolol tartrate  12.5 mg Per Tube BID  . mupirocin ointment  1 application Nasal BID  . pantoprazole  40 mg Oral Daily  . pneumococcal 23 valent vaccine  0.5 mL Intramuscular Tomorrow-1000  . sodium chloride flush  3 mL Intravenous Q12H   Continuous Infusions: . sodium chloride 250 mL (09/16/16 0600)  . lactated ringers Stopped (09/16/16 0800)  . lactated ringers 20 mL/hr at 09/15/16 1330   PRN Meds: metoprolol tartrate, ondansetron (ZOFRAN) IV, oxyCODONE, sodium chloride flush, traMADol   Vital Signs    Vitals:   09/18/16 0600 09/18/16 0628 09/18/16 0643 09/18/16 0700  BP: 118/64 (!) 146/62  (!) 87/72  Pulse: 80  89 85  Resp: (!) 21  13 19   Temp:      TempSrc:      SpO2: 94%  99% 94%  Weight:      Height:        Intake/Output Summary (Last 24 hours) at 09/18/16 0733 Last data filed at 09/18/16 0500  Gross per 24 hour  Intake              600 ml  Output             5600 ml  Net            -5000 ml   Filed Weights   09/16/16 0500 09/17/16 0529 09/18/16 0555  Weight: 144 lb 13.5 oz (65.7 kg)  149 lb 4 oz (67.7 kg) 135 lb 9.3 oz (61.5 kg)    Telemetry    Sinus rhythm. Personally reviewed.  ECG    Tracing from 09/16/2016 shows sinus rhythm with decreased anterior R-wave progression. Personally reviewed.  Physical Exam   GEN: No acute distress.   Neck: No JVD. Cardiac: RRR, no gallop.  Thorax: Dressed sternal incision stable. Respiratory: Nonlabored. Clear to auscultation bilaterally. GI: Soft, nontender, bowel sounds present. MS: No edema; No deformity.  Labs    Chemistry Recent Labs Lab 09/14/16 0551  09/16/16 0438 09/16/16 1720 09/16/16 1734 09/17/16 0419 09/18/16 0448  NA 136  < > 134*  --  135 130* 135  K 3.7  < > 4.1  --  4.4 3.9 3.8  CL 102  < > 106  --  96* 100* 101  CO2 26  < > 23  --   --  26 30  GLUCOSE 258*  < > 117*  --  129* 79 102*  BUN 21*  < > 12  --  18 14 13   CREATININE 1.04  < > 0.90 0.96 0.80 0.91 0.99  CALCIUM 9.1  < > 8.1*  --   --  8.3* 8.5*  PROT 6.9  --   --   --   --   --   --   ALBUMIN 3.1*  --   --   --   --   --   --   AST 14*  --   --   --   --   --   --   ALT 16*  --   --   --   --   --   --   ALKPHOS 56  --   --   --   --   --   --   BILITOT 0.8  --   --   --   --   --   --   GFRNONAA >60  < > >60 >60  --  >60 >60  GFRAA >60  < > >60 >60  --  >60 >60  ANIONGAP 8  < > 5  --   --  4* 4*  < > = values in this interval not displayed.   Hematology  Recent Labs Lab 09/16/16 1720 09/16/16 1734 09/17/16 0419 09/18/16 0445  WBC 8.8  --  9.2 6.3  RBC 2.93*  --  2.90* 2.88*  HGB 9.2* 9.5* 9.0* 8.9*  HCT 28.0* 28.0* 27.6* 27.2*  MCV 95.6  --  95.2 94.4  MCH 31.4  --  31.0 30.9  MCHC 32.9  --  32.6 32.7  RDW 12.7  --  12.6 12.4  PLT 146*  --  148* 163    Cardiac Enzymes  Recent Labs Lab 09/12/16 0621  TROPONINI 1.54*     Recent Labs Lab 09/12/16 0628  TROPIPOC 2.14*     Radiology    Dg Chest Port 1 View  Result Date: 09/17/2016 CLINICAL DATA:  Postop bypass surgery. EXAM: PORTABLE CHEST 1 VIEW  COMPARISON:  09/16/2016 FINDINGS: The Swan-Ganz catheter has been removed. The right IJ Cordis is still in place. Stable left-sided chest tube without definite pneumothorax. Slightly low lung volumes with streaky basilar atelectasis but no definite effusions or edema. IMPRESSION: Removal of the Swan-Ganz catheter. Low lung volumes with streaky basilar atelectasis but no pneumothorax, edema or effusion. Electronically Signed   By: Rudie MeyerP.  Gallerani M.D.   On: 09/17/2016 10:00    Cardiac Studies   Intraoperative TEE 09/15/2016:  Left ventricle: Normal cavity size. Concentric hypertrophy of moderate severity. LV systolic function is normal with an EF of 60-65%. There are no obvious wall motion abnormalities.  Aortic valve: The valve is trileaflet. No stenosis. No regurgitation.  Right ventricle: Normal cavity size, wall thickness and ejection fraction.  Tricuspid valve: Trace regurgitation. The tricuspid valve regurgitation jet is central.  Cardiac catheterization 09/12/2016: 1. Severe proximal LAD stenosis  2. Severe proximal LCx stenosis 3. Mild-moderate RCA stenosis 4. Preserved LV systolic function with normal LVEF (>65%) and normal LVEDP  Patient Profile     66 y.o. male with hyperlipidemia, type 2 diabetes mellitus, and multivessel CAD status post NSTEMI. He underwent CABG on June 28.  Assessment & Plan    1. NSTEMI, No recurrent angina now status post revascularization with CABG on June 28.  2. Multivessel CAD, LVEF 60-65%. Question of some postoperative volume overload, but weights do not appear accurate. He  is auto diuresing. No edema or effusions by CXR.  3. Hyperlipidemia, on high-dose Lipitor. Baseline LDL 123.  4. Postoperative blood loss anemia, hemoglobin 8.9.  Continue aspirin, Lipitor, and Lopressor. Was on Prinzide as an outpatient, do not add back yet with current blood pressures. Continue progression per TCTS service.  Signed, Nona Dell, MD  09/18/2016, 7:33 AM

## 2016-09-18 NOTE — Op Note (Signed)
NAMNoel Clark:  Carl Clark           ACCOUNT NO.:  [id number]  MEDICAL RECORD NO.:  [id number]  LOCATION:  2H20C                        FACILITY:  [facility]  PHYSICIAN:  Sheliah PlaneEdward Harmani Neto, MD    DATE OF BIRTH:  1950/08/16  DATE OF PROCEDURE:  09/15/2016 DATE OF DISCHARGE:                              OPERATIVE REPORT   SURGICAL PROCEDURES:  Coronary artery bypass grafting x3 with left internal mammary to the left anterior descending coronary artery, reverse saphenous vein graft to the diagonal coronary artery, reverse saphenous vein graft to the second obtuse marginal with right thigh and calf greater saphenous vein harvesting endoscopically.  SURGEON:  Sheliah PlaneEdward Dryden Tapley, MD.  FIRST ASSISTANT:  Lowella DandyErin Barrett, PA.  BRIEF HISTORY:  The patient is a 66 year old male, who works in [employer], who noted increasing symptoms of discomfort in his chest and presented to the emergency room.  Because of his elevated troponin, he was seen by Cardiology for non-ST-elevation myocardial infarction and underwent coronary arteriogram by Dr. Excell Seltzerooper.  At the time of catheterization, he had preserved LV function.  He had 50% lesion and mild luminal irregularities of the right coronary artery, but had high- grade complex stenosis involving the left main coronary artery and proximal LAD and diagonal.  His circumflex gave off a large first and second obtuse marginal branch.  The continuation of the circumflex was relatively small.  Because of the patient's anatomy, coronary artery bypass grafting was recommended to him.  Risks and options were discussed through the interpreter and the patient was willing to proceed and signed informed consent.  DESCRIPTION OF PROCEDURE:  With Swan-Ganz and arterial line monitors in place, the patient underwent general endotracheal anesthesia without incident.  The skin of chest and legs were prepped with Betadine and draped in usual sterile manner.  Appropriate  time-out was performed.  We proceeded with median sternotomy.  The left internal mammary artery was dissected down as a pedicle graft.  The distal artery was divided and had good free flow.  The vessel was hydrostatically dilated with heparinized saline.  The right greater saphenous vein was harvested endoscopically from the right thigh and calf.  The vein was of adequate quality and caliber.  The patient's pericardium was opened.  Overall, ventricular function appeared preserved.  The patient was systemically heparinized.  The ascending aorta was cannulated.  The right atrium was cannulated.  An aortic root vent cardioplegia needle was introduced into the ascending aorta.  The patient was placed on cardiopulmonary bypass 2.4 L/min/m2.  Sites of anastomosis were selected and dissected out of the epicardium.  As noted, the right coronary artery had noncritical disease and was not bypassed.  The third obtuse marginal and distal circumflex were very small vessels and not large enough to bypass.  The second obtuse marginal was relatively large.  Aortic crossclamp was applied and 500 mL of cold blood potassium cardioplegia was administered with diastolic arrest of the heart.  Myocardial septal temperatures were monitored throughout the crossclamp.  Attention was turned first to second obtuse marginal, which was partially intramyocardial.  The vessel was opened and admitted 1.5 mm probe distally.  Using a running 7-0 Prolene, distal anastomosis was performed with a second  reverse saphenous vein graft.  The diagonal coronary artery was relatively small approximately 1.2 mm in size, but was relatively long vessel.  Using a running 7-0 Prolene, a distal anastomosis was performed.  Attention was then turned to the left anterior descending coronary artery between the mid and distal third.  The vessel was opened and admitted a 1.5 mm probe distally and proximally.  Using a running 8-0 Prolene, left  internal mammary artery was anastomosed to the left anterior descending coronary artery.  With the crossclamp still in place, 2 punch aortotomies were performed and each of the 2 vein grafts were anastomosed to the ascending aorta.  The heart was allowed to passively fill and de-air. The bulldog was removed from the mammary artery with prompt rise in myocardial septal temperature.  Aortic crossclamp was removed with a total crossclamp time of 65 minutes.  Sites of anastomosis were inspected and were free of bleeding.  The patient was then ventilated and he spontaneously converted to a sinus rhythm.  Atrial and ventricular pacing wires were applied.  The patient was then ventilated and weaned from cardiopulmonary bypass without difficulty.  He remained hemodynamically stable, was decannulated in usual fashion.  Protamine sulfate was administered with operative field hemostatic.  The left pleural tube and a Blake mediastinal drain were left in place. Pericardium was loosely reapproximated.  Sternum was closed with #6 stainless steel wire.  Fascia was closed with interrupted 0 Vicryl, running 3-0 Vicryl in subcutaneous tissue, 4-0 subcuticular stitch in skin edges.  Dry dressings were applied.  Sponge and needle count were reported as correct at the completion of the procedure.  The patient tolerated the procedure without obvious complication, was transferred to the Surgical Intensive Care Unit.  Total pump time was 104 minutes.  The patient did not require any blood bank blood products during the operative procedure.     Sheliah Plane, MD     EG/MEDQ  D:  09/18/2016  T:  09/18/2016  Job:  [id number]

## 2016-09-18 NOTE — Progress Notes (Signed)
3 Days Post-Op Procedure(s) (LRB): CORONARY ARTERY BYPASS GRAFTING (CABG) x 3, LIMA to LAD, SVG to DIAGONAL, SVG to OM2, USING LEFT MAMMARY ARTERY AND RIGHT GREATER SAPHENOUS VEIN HARVESTED ENDOSCOPICALLY (N/A) TRANSESOPHAGEAL ECHOCARDIOGRAM (TEE) (N/A) Subjective:  Chest sore but otherwise ok  Objective: Vital signs in last 24 hours: Temp:  [98.3 F (36.8 C)-99.2 F (37.3 C)] 99.2 F (37.3 C) (07/01 0813) Pulse Rate:  [66-90] 85 (07/01 1003) Cardiac Rhythm: Normal sinus rhythm (07/01 0900) Resp:  [8-23] 17 (07/01 1003) BP: (76-146)/(52-72) 111/58 (07/01 1002) SpO2:  [93 %-99 %] 96 % (07/01 1003) Weight:  [61.5 kg (135 lb 9.3 oz)] 61.5 kg (135 lb 9.3 oz) (07/01 0555)  Hemodynamic parameters for last 24 hours:    Intake/Output from previous day: 06/30 0701 - 07/01 0700 In: 1080 [P.O.:1000; I.V.:80] Out: 5600 [Urine:5530; Chest Tube:70] Intake/Output this shift: Total I/O In: 240 [P.O.:240] Out: -   General appearance: alert and cooperative Neurologic: intact Heart: regular rate and rhythm, S1, S2 normal, no murmur, click, rub or gallop Lungs: clear to auscultation bilaterally Extremities: edema mild Wound: incisions ok  Lab Results:  Recent Labs  09/17/16 0419 09/18/16 0445  WBC 9.2 6.3  HGB 9.0* 8.9*  HCT 27.6* 27.2*  PLT 148* 163   BMET:  Recent Labs  09/17/16 0419 09/18/16 0448  NA 130* 135  K 3.9 3.8  CL 100* 101  CO2 26 30  GLUCOSE 79 102*  BUN 14 13  CREATININE 0.91 0.99  CALCIUM 8.3* 8.5*    PT/INR:  Recent Labs  09/15/16 1315  LABPROT 18.9*  INR 1.57   ABG    Component Value Date/Time   PHART 7.386 09/15/2016 1951   HCO3 24.0 09/15/2016 1951   TCO2 26 09/16/2016 1734   ACIDBASEDEF 1.0 09/15/2016 1951   O2SAT 99.0 09/15/2016 1951   CBG (last 3)   Recent Labs  09/17/16 2358 09/18/16 0529 09/18/16 0812  GLUCAP 76 89 170*   CXR: clear  Assessment/Plan: S/P Procedure(s) (LRB): CORONARY ARTERY BYPASS GRAFTING (CABG) x 3,  LIMA to LAD, SVG to DIAGONAL, SVG to OM2, USING LEFT MAMMARY ARTERY AND RIGHT GREATER SAPHENOUS VEIN HARVESTED ENDOSCOPICALLY (N/A) TRANSESOPHAGEAL ECHOCARDIOGRAM (TEE) (N/A)  POD 3  Hemodynamically stable in sinus rhythm  Mild volume excess: 3 lbs over preop. Continue diuretic  IS, ambulation  DM: preop Hgb A1c 9.6. Glucose has been labile. He has a box of candy in his room. Continue Levemir and SSI. Will resume Metformin today and glucotrol tomorrow. He needs dietary education and follow up.  Transfer to 2W.   LOS: 6 days    Carl Clark 09/18/2016

## 2016-09-18 NOTE — Progress Notes (Signed)
Report called to RN on 2W 

## 2016-09-19 LAB — BASIC METABOLIC PANEL
ANION GAP: 6 (ref 5–15)
BUN: 19 mg/dL (ref 6–20)
CO2: 28 mmol/L (ref 22–32)
Calcium: 8.3 mg/dL — ABNORMAL LOW (ref 8.9–10.3)
Chloride: 101 mmol/L (ref 101–111)
Creatinine, Ser: 1.14 mg/dL (ref 0.61–1.24)
GFR calc Af Amer: >60 mL/min (ref >60)
GFR calc non Af Amer: >60 mL/min (ref >60)
GLUCOSE: 147 mg/dL — AB (ref 65–99)
POTASSIUM: 4.1 mmol/L (ref 3.5–5.1)
Sodium: 135 mmol/L (ref 135–145)

## 2016-09-19 LAB — GLUCOSE, CAPILLARY
GLUCOSE-CAPILLARY: 140 mg/dL — AB (ref 65–99)
GLUCOSE-CAPILLARY: 135 mg/dL — AB (ref 65–99)
GLUCOSE-CAPILLARY: 251 mg/dL — AB (ref 65–99)
GLUCOSE-CAPILLARY: 90 mg/dL (ref 65–99)

## 2016-09-19 LAB — CBC
HEMATOCRIT: 27.1 % — AB (ref 39.0–52.0)
Hemoglobin: 8.9 g/dL — ABNORMAL LOW (ref 13.0–17.0)
MCH: 30.8 pg (ref 26.0–34.0)
MCHC: 32.8 g/dL (ref 30.0–36.0)
MCV: 93.8 fL (ref 78.0–100.0)
Platelets: 237 10*3/uL (ref 150–400)
RBC: 2.89 MIL/uL — AB (ref 4.22–5.81)
RDW: 12.4 % (ref 11.5–15.5)
WBC: 6.4 10*3/uL (ref 4.0–10.5)

## 2016-09-19 MED ORDER — LISINOPRIL 5 MG PO TABS
5.0000 mg | ORAL_TABLET | Freq: Every day | ORAL | Status: DC
  Administered 2016-09-19 – 2016-09-21 (×3): 5 mg via ORAL
  Filled 2016-09-19 (×3): qty 1

## 2016-09-19 MED ORDER — GLIPIZIDE 10 MG PO TABS: 10.0000 mg | ORAL_TABLET | Freq: Two times a day (BID) | ORAL | Status: DC

## 2016-09-19 MED ORDER — GLIPIZIDE 5 MG PO TABS
5.0000 mg | ORAL_TABLET | Freq: Two times a day (BID) | ORAL | Status: DC
  Administered 2016-09-19 – 2016-09-23 (×7): 5 mg via ORAL
  Filled 2016-09-19 (×8): qty 1

## 2016-09-19 MED ORDER — FUROSEMIDE 40 MG PO TABS
40.0000 mg | ORAL_TABLET | Freq: Every day | ORAL | Status: AC
  Administered 2016-09-19 – 2016-09-20 (×2): 40 mg via ORAL
  Filled 2016-09-19 (×2): qty 1

## 2016-09-19 NOTE — Progress Notes (Signed)
Inpatient Diabetes Program Recommendations  AACE/ADA: New Consensus Statement on Inpatient Glycemic Control (2015)  Target Ranges:  Prepandial:   less than 140 mg/dL      Peak postprandial:   less than 180 mg/dL (1-2 hours)      Critically ill patients:  140 - 180 mg/dL   Lab Results  Component Value Date   GLUCAP 140 (H) 09/19/2016   HGBA1C 9.6 (H) 09/12/2016    Review of Glycemic Control:  Results for Angeline SlimJADALRAB, [place] BAKHEET (MRN [id number]) as of 09/19/2016 11:02  Ref. Range 09/18/2016 08:12 09/18/2016 12:12 09/18/2016 16:23 09/18/2016 21:45 09/19/2016 06:41  Glucose-Capillary Latest Ref Range: 65 - 99 mg/dL 119170 (H) 147305 (H) 829146 (H) 65 140 (H)   Diabetes history: Type 2 diabetes Outpatient Diabetes medications: Glucotrol 10 mg bid, Levemir 20 units daily, Metformin 1000 mg bid Current orders for Inpatient glycemic control:  Levemir 10 units daily, TCTS tid with meals and HS, Metformin 1000 mg bid  Inpatient Diabetes Program Recommendations:   Please change Novolog correction to sensitive tid with meals with HS scale.  Also consider adding Novolog meal coverage 3 units tid with meals (hold if patient eats less than 50%).   Thanks, Beryl MeagerJenny Leiam Hopwood, RN, BC-ADM Inpatient Diabetes Coordinator Pager [phone] (8a-5p)

## 2016-09-19 NOTE — Progress Notes (Addendum)
[address]       [place] [address]             [phone]      4 Days Post-Op Procedure(s) (LRB): CORONARY ARTERY BYPASS GRAFTING (CABG) x 3, LIMA to LAD, SVG to DIAGONAL, SVG to OM2, USING LEFT MAMMARY ARTERY AND RIGHT GREATER SAPHENOUS VEIN HARVESTED ENDOSCOPICALLY (N/A) TRANSESOPHAGEAL ECHOCARDIOGRAM (TEE) (N/A) Subjective: Feels a lot of chest (incisional ) discomfort  Objective: Vital signs in last 24 hours: Temp:  [97.7 F (36.5 C)-98.5 F (36.9 C)] 98.3 F (36.8 C) (07/02 0501) Pulse Rate:  [74-84] 74 (07/02 0501) Cardiac Rhythm: Normal sinus rhythm (07/02 0930) Resp:  [15-19] 18 (07/02 0501) BP: (101-136)/(55-78) 112/58 (07/02 0501) SpO2:  [95 %-100 %] 97 % (07/02 0501) Weight:  [132 lb 12.8 oz (60.2 kg)-138 lb 6.4 oz (62.8 kg)] 132 lb 12.8 oz (60.2 kg) (07/02 0501)  Hemodynamic parameters for last 24 hours:    Intake/Output from previous day: 07/01 0701 - 07/02 0700 In: 720 [P.O.:720] Out: -  Intake/Output this shift: No intake/output data recorded.  General appearance: alert, cooperative and no distress Heart: regular rate and rhythm Lungs: clear to auscultation bilaterally Abdomen: benign Extremities: min edema Wound: incis healing well  Lab Results:  Recent Labs  09/18/16 0445 09/19/16 0226  WBC 6.3 6.4  HGB 8.9* 8.9*  HCT 27.2* 27.1*  PLT 163 237   BMET:  Recent Labs  09/18/16 0448 09/19/16 0226  NA 135 135  K 3.8 4.1  CL 101 101  CO2 30 28  GLUCOSE 102* 147*  BUN 13 19  CREATININE 0.99 1.14  CALCIUM 8.5* 8.3*    PT/INR: No results for input(s): LABPROT, INR in the last 72 hours. ABG    Component Value Date/Time   PHART 7.386 09/15/2016 1951   HCO3 24.0 09/15/2016 1951   TCO2 26 09/16/2016 1734   ACIDBASEDEF 1.0 09/15/2016 1951   O2SAT 99.0 09/15/2016 1951   CBG (last 3)   Recent Labs  09/18/16 1623 09/18/16 2145 09/19/16 0641  GLUCAP 146* 65 140*    Meds Scheduled Meds: . aspirin EC   325 mg Oral Daily  . atorvastatin  80 mg Oral q1800  . Chlorhexidine Gluconate Cloth  6 each Topical Daily  . docusate sodium  200 mg Oral Daily  . insulin aspart  0-24 Units Subcutaneous TID AC & HS  . insulin detemir  10 Units Subcutaneous Daily  . mouth rinse  15 mL Mouth Rinse BID  . metFORMIN  1,000 mg Oral BID WC  . metoprolol tartrate  25 mg Oral BID  . mupirocin ointment  1 application Nasal BID  . pantoprazole  40 mg Oral QAC breakfast  . pneumococcal 23 valent vaccine  0.5 mL Intramuscular Tomorrow-1000  . potassium chloride  20 mEq Oral BID  . sodium chloride flush  3 mL Intravenous Q12H   Continuous Infusions: . sodium chloride     PRN Meds:.sodium chloride, acetaminophen, bisacodyl **OR** bisacodyl, ondansetron **OR** ondansetron (ZOFRAN) IV, oxyCODONE, sodium chloride flush, traMADol  Xrays Dg Chest Port 1 View  Result Date: 09/18/2016 CLINICAL DATA:  Follow-up CABG.  Chest tube removal. EXAM: PORTABLE CHEST 1 VIEW COMPARISON:  09/17/2016 FINDINGS: Left chest tube is been removed. No pneumothorax. Right internal jugular venous access sheath remains in place. Previous median sternotomy and CABG. Lungs are well aerated. Vascularity is normal. IMPRESSION: No pneumothorax following left chest tube removal. No active disease. Electronically Signed  By: Paulina FusiMark  Shogry M.D.   On: 09/18/2016 09:51    Assessment/Plan: S/P Procedure(s) (LRB): CORONARY ARTERY BYPASS GRAFTING (CABG) x 3, LIMA to LAD, SVG to DIAGONAL, SVG to OM2, USING LEFT MAMMARY ARTERY AND RIGHT GREATER SAPHENOUS VEIN HARVESTED ENDOSCOPICALLY (N/A) TRANSESOPHAGEAL ECHOCARDIOGRAM (TEE) (N/A)  1 doing well 2 discussed carbohydrate restrictions for diabetes, but he doesn't understand, sugars fairly well controlled, restart glucotrol, stop insulin 3 H/H stable 4 creat normal, will see if he tolerates a little ACE-I. Will give another dose of lasix today for mild volume overload 5 has nobody at home to assist at  discharge and says he will not go to a nursing home, wants assistance getting children from [place]- will get SW consult to assistif they have any ideas 6 push rehab and pulm toilet    LOS: 7 days    Clark,Carl E 09/19/2016  Patient says his partner will be at home with him Home 1-2 days  Wound/ sternum intact I have seen and examined Carl Clark and agree with the above assessment  and plan.  Delight OvensEdward B Reighlyn Elmes MD Beeper [phone] Office [phone] 09/19/2016 6:18 PM

## 2016-09-19 NOTE — Progress Notes (Signed)
CARDIAC REHAB PHASE I   PRE:  Rate/Rhythm: 82 SR    BP: sitting 133/65    SaO2: 95 RA  MODE:  Ambulation: 500 ft   POST:  Rate/Rhythm: 97 SR    BP: sitting 123/65     SaO2: 97 RA   Pt needed min assist to get out of bed. Sts his chest hurts 7/10. Steady walking with RW, no c/o. To recliner. Friend walked with him. His friend Kandis MannanOmar wants to translate tomorrow. [id number]  Harriet MassonRandi Kristan Fabianna Keats CES, ACSM 09/19/2016 11:59 AM

## 2016-09-20 ENCOUNTER — Encounter: Payer: Self-pay | Admitting: Physician Assistant

## 2016-09-20 DIAGNOSIS — Z951 Presence of aortocoronary bypass graft: Secondary | ICD-10-CM

## 2016-09-20 LAB — GLUCOSE, CAPILLARY
Glucose-Capillary: 162 mg/dL — ABNORMAL HIGH (ref 65–99)
Glucose-Capillary: 272 mg/dL — ABNORMAL HIGH (ref 65–99)
Glucose-Capillary: 217 mg/dL — ABNORMAL HIGH (ref 65–99)
Glucose-Capillary: 107 mg/dL — ABNORMAL HIGH (ref 65–99)

## 2016-09-20 MED ORDER — LACTULOSE 10 GM/15ML PO SOLN
20.0000 g | Freq: Once | ORAL | Status: AC
  Administered 2016-09-20: 20 g via ORAL
  Filled 2016-09-20: qty 30

## 2016-09-20 NOTE — Progress Notes (Addendum)
      [address]       [place] [id number]             [phone]      5 Days Post-Op Procedure(s) (LRB): CORONARY ARTERY BYPASS GRAFTING (CABG) x 3, LIMA to LAD, SVG to DIAGONAL, SVG to OM2, USING LEFT MAMMARY ARTERY AND RIGHT GREATER SAPHENOUS VEIN HARVESTED ENDOSCOPICALLY (N/A) TRANSESOPHAGEAL ECHOCARDIOGRAM (TEE) (N/A)   Subjective:  Patient complains of abdominal pain.  He states he has not yet moved his bowels since surgery.  Objective: Vital signs in last 24 hours: Temp:  [98.6 F (37 C)-100.5 F (38.1 C)] 98.6 F (37 C) (07/03 0518) Pulse Rate:  [67-94] 67 (07/03 0518) Cardiac Rhythm: Sinus tachycardia (07/03 0755) Resp:  [16-19] 17 (07/03 0518) BP: (114-122)/(57-62) 114/60 (07/03 0518) SpO2:  [98 %-99 %] 98 % (07/03 0518) Weight:  [128 lb 11.2 oz (58.4 kg)] 128 lb 11.2 oz (58.4 kg) (07/03 0518)  General appearance: alert, cooperative and no distress Heart: regular rate and rhythm Lungs: clear to auscultation bilaterally Abdomen: soft, non-tender; bowel sounds normal; no masses,  no organomegaly Extremities: edema none appreciated Wound: clean and dry  Lab Results:  Recent Labs  09/18/16 0445 09/19/16 0226  WBC 6.3 6.4  HGB 8.9* 8.9*  HCT 27.2* 27.1*  PLT 163 237   BMET:  Recent Labs  09/18/16 0448 09/19/16 0226  NA 135 135  K 3.8 4.1  CL 101 101  CO2 30 28  GLUCOSE 102* 147*  BUN 13 19  CREATININE 0.99 1.14  CALCIUM 8.5* 8.3*    PT/INR: No results for input(s): LABPROT, INR in the last 72 hours. ABG    Component Value Date/Time   PHART 7.386 09/15/2016 1951   HCO3 24.0 09/15/2016 1951   TCO2 26 09/16/2016 1734   ACIDBASEDEF 1.0 09/15/2016 1951   O2SAT 99.0 09/15/2016 1951   CBG (last 3)   Recent Labs  09/19/16 1640 09/19/16 2202 09/20/16 0624  GLUCAP 251* 90 162*    Assessment/Plan: S/P Procedure(s) (LRB): CORONARY ARTERY BYPASS GRAFTING (CABG) x 3, LIMA to LAD, SVG to DIAGONAL, SVG to OM2, USING LEFT  MAMMARY ARTERY AND RIGHT GREATER SAPHENOUS VEIN HARVESTED ENDOSCOPICALLY (N/A) TRANSESOPHAGEAL ECHOCARDIOGRAM (TEE) (N/A)  1. CV- hemodynamically stable, continue Lopressor, Lisinopril 2. Pulm- no acute issues, continue IS 3. Renal- creatinine mildly elevated at 1.14, weight is below baseline, one more dose of lasix today 4. DM- cbgs mostly controlled, incidental sugar at 251, continue Metformin, Glipizide 5. GI- lactulose once today 6. Dispo- patient stable, will d/c EPW today, lactulose for constipation... Likely home in AM if remains stable   LOS: 8 days    BARRETT, ERIN 09/20/2016  pacng wires out now Wounds inatct Poss home in am if bowels move  I have seen and examined Carl SlimAbdullah Bakheet Clark and agree with the above assessment  and plan.  Delight OvensEdward B Henlee Donovan MD [employer] [phone] Office [phone] 09/20/2016 8:15 PM

## 2016-09-20 NOTE — Discharge Instructions (Signed)
Coronary Artery Bypass Grafting, Care After °This sheet gives you information about how to care for yourself after your procedure. Your health care provider may also give you more specific instructions. If you have problems or questions, contact your health care provider. °What can I expect after the procedure? °After the procedure, it is common to have: °· Nausea and a lack of appetite. °· Constipation. °· Weakness and fatigue. °· Depression or irritability. °· Pain or discomfort in your incision areas. ° °Follow these instructions at home: °Medicines °· Take over-the-counter and prescription medicines only as told by your health care provider. Do not stop taking medicines or start any new medicines without approval from your health care provider. °· If you were prescribed an antibiotic medicine, take it as told by your health care provider. Do not stop taking the antibiotic even if you start to feel better. °· Do not drive or use heavy machinery while taking prescription pain medicine. °Incision care °· Follow instructions from your health care provider about how to take care of your incisions. Make sure you: °? Wash your hands with soap and water before you change your bandage (dressing). If soap and water are not available, use hand sanitizer. °? Change your dressing as told by your health care provider. °? Leave stitches (sutures), skin glue, or adhesive strips in place. These skin closures may need to stay in place for 2 weeks or longer. If adhesive strip edges start to loosen and curl up, you may trim the loose edges. Do not remove adhesive strips completely unless your health care provider tells you to do that. °· Keep incision areas clean, dry, and protected. °· Check your incision areas every day for signs of infection. Check for: °? More redness, swelling, or pain. °? More fluid or blood. °? Warmth. °? Pus or a bad smell. °· If incisions were made in your legs: °? Avoid crossing your legs. °? Avoid  sitting for long periods of time. Change positions every 30 minutes. °? Raise (elevate) your legs when you are sitting. °Bathing °· Do not take baths, swim, or use a hot tub until your health care provider approves. °· Only take sponge baths. Pat the incisions dry. Do not rub incisions with a washcloth or towel. °· Ask your health care provider when you can shower. °Eating and drinking °· Eat foods that are high in fiber, such as raw fruits and vegetables, whole grains, beans, and nuts. Meats should be lean cut. Avoid canned, processed, and fried foods. This can help prevent constipation and is a recommended part of a heart-healthy diet. °· Drink enough fluid to keep your urine clear or pale yellow. °· Limit alcohol intake to no more than 1 drink a day for nonpregnant women and 2 drinks a day for men. One drink equals 12 oz of beer, 5 oz of wine, or 1½ oz of hard liquor. °Activity °· Rest and limit your activity as told by your health care provider. You may be instructed to: °? Stop any activity right away if you have chest pain, shortness of breath, irregular heartbeats, or dizziness. Get help right away if you have any of these symptoms. °? Move around frequently for short periods or take short walks as directed by your health care provider. Gradually increase your activities. You may need physical therapy or cardiac rehabilitation to help strengthen your muscles and build your endurance. °? Avoid lifting, pushing, or pulling anything that is heavier than 10 lb (4.5 kg) for at   least 6 weeks or as told by your health care provider. °· Do not drive until your health care provider approves. °· Ask your health care provider when you may return to work. °· Ask your health care provider when you may resume sexual activity. °General instructions °· Do not use any products that contain nicotine or tobacco, such as cigarettes and e-cigarettes. If you need help quitting, ask your health care provider. °· Take 2-3 deep  breaths every few hours during the day, while you recover. This helps expand your lungs and prevent complications like pneumonia after surgery. °· If you were given a device called an incentive spirometer, use it several times a day to practice deep breathing. Support your chest with a pillow or your arms when you take deep breaths or cough. °· Wear compression stockings as told by your health care provider. These stockings help to prevent blood clots and reduce swelling in your legs. °· Weigh yourself every day. This helps identify if your body is holding (retaining) fluid that may make your heart and lungs work harder. °· Keep all follow-up visits as told by your health care provider. This is important. °Contact a health care provider if: °· You have more redness, swelling, or pain around any incision. °· You have more fluid or blood coming from any incision. °· Any incision feels warm to the touch. °· You have pus or a bad smell coming from any incision °· You have a fever. °· You have swelling in your ankles or legs. °· You have pain in your legs. °· You gain 2 lb (0.9 kg) or more a day. °· You are nauseous or you vomit. °· You have diarrhea. °Get help right away if: °· You have chest pain that spreads to your jaw or arms. °· You are short of breath. °· You have a fast or irregular heartbeat. °· You notice a "clicking" in your breastbone (sternum) when you move. °· You have numbness or weakness in your arms or legs. °· You feel dizzy or light-headed. °Summary °· After the procedure, it is common to have pain or discomfort in the incision areas. °· Do not take baths, swim, or use a hot tub until your health care provider approves. °· Gradually increase your activities. You may need physical therapy or cardiac rehabilitation to help strengthen your muscles and build your endurance. °· Weigh yourself every day. This helps identify if your body is holding (retaining) fluid that may make your heart and lungs work  harder. °This information is not intended to replace advice given to you by your health care provider. Make sure you discuss any questions you have with your health care provider. °Document Released: 09/24/2004 Document Revised: 01/25/2016 Document Reviewed: 01/25/2016 °Elsevier Interactive Patient Education © 2018 [employer] Inc[employer] ° ° °Endoscopic Saphenous Vein Harvesting, Care After °Refer to this sheet in the next few weeks. These instructions provide you with information about caring for yourself after your procedure. Your health care provider may also give you more specific instructions. Your treatment has been planned according to current medical practices, but problems sometimes occur. Call your health care provider if you have any problems or questions after your procedure. °What can I expect after the procedure? °After the procedure, it is common to have: °· Pain. °· Bruising. °· Swelling. °· Numbness. ° °Follow these instructions at home: °Medicine °· Take over-the-counter and prescription medicines only as told by your health care provider. °· Do not drive or operate heavy machinery   while taking prescription pain medicine. °Incision care ° °· Follow instructions from your health care provider about how to take care of the cut made during surgery (incision). Make sure you: °? Wash your hands with soap and water before you change your bandage (dressing). If soap and water are not available, use hand sanitizer. °? Change your dressing as told by your health care provider. °? Leave stitches (sutures), skin glue, or adhesive strips in place. These skin closures may need to be in place for 2 weeks or longer. If adhesive strip edges start to loosen and curl up, you may trim the loose edges. Do not remove adhesive strips completely unless your health care provider tells you to do that. °· Check your incision area every day for signs of infection. Check for: °? More redness, swelling, or pain. °? More fluid or  blood. °? Warmth. °? Pus or a bad smell. °General instructions °· Raise (elevate) your legs above the level of your heart while you are sitting or lying down. °· Do any exercises your health care providers have given you. These may include deep breathing, coughing, and walking exercises. °· Do not shower, take baths, swim, or use a hot tub unless told by your health care provider. °· Wear your elastic stocking if told by your health care provider. °· Keep all follow-up visits as told by your health care provider. This is important. °Contact a health care provider if: °· Medicine does not help your pain. °· Your pain gets worse. °· You have new leg bruises or your leg bruises get bigger. °· You have a fever. °· Your leg feels numb. °· You have more redness, swelling, or pain around your incision. °· You have more fluid or blood coming from your incision. °· Your incision feels warm to the touch. °· You have pus or a bad smell coming from your incision. °Get help right away if: °· Your pain is severe. °· You develop pain, tenderness, warmth, redness, or swelling in any part of your leg. °· You have chest pain. °· You have trouble breathing. °This information is not intended to replace advice given to you by your health care provider. Make sure you discuss any questions you have with your health care provider. °Document Released: 11/17/2010 Document Revised: 08/13/2015 Document Reviewed: 01/19/2015 °Elsevier Interactive Patient Education © 2018 Elsevier Inc. ° ° °

## 2016-09-20 NOTE — Discharge Summary (Signed)
Physician Discharge Summary  Patient ID: Carl Clark MRN: [id number] DOB/AGE: 65/03/1950 65 y.o.  Admit date: 09/12/2016 Discharge date: 09/23/2016  Admission Diagnoses:  Patient Active Problem List   Diagnosis Date Noted  . NSTEMI (non-ST elevated myocardial infarction) (HCC) 09/12/2016  . Rectal bleeding 07/13/2012  . Type II or unspecified type diabetes mellitus with unspecified complication, uncontrolled 07/13/2012  . Unspecified essential hypertension 07/13/2012   Discharge Diagnoses:   Patient Active Problem List   Diagnosis Date Noted  . 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 NSTEMI (non-ST elevated myocardial infarction) (HCC) 09/12/2016  . Rectal bleeding 07/13/2012  . Type II or unspecified type diabetes mellitus with unspecified complication, uncontrolled 07/13/2012  . Unspecified essential hypertension 07/13/2012   Discharged Condition: good  History of Present Illness:  [name] is a 66 yo male who presented to the ED with complaints of episodic chest pain.  He was attending mosque on 6/25 with a primary care physician who told the patient he need to go to the EF.  Workup showed no EKG changes but positive troponin levels.  He was ruled in for NSTEMI and admitted for further cardiac workup.    Hospital Course:   He was taken for cardiac catheterization which showed multivessel CAD. It was felt coronary bypass grafting would be indicated and TCTS consult was obtained.  He was evaluated by Dr. Tyrone SageGerhardt who felt coronary bypass grafting would be indicated.  The risks and benefits of the procedure were explained to the patient and he was agreeable to proceed.  He was taken to the operating room and underwent CABG x 3 utilizing LIMA to LAD, SVG to OM and SVG to Diagonal.  He also underwent endoscopic harvest of right greater saphenous vein.  He tolerated the procedure without difficulty and was taken to the SICU in stable condition.  He was extubated the evening of  surgery.  During his stay in the SICU the patient was weaned off Dopamine and NTG drips as tolerated.  His chest tubes and arterial lines were removed without difficulty.  He was started on diuresis for hypervolemia.  He was maintaining NSR and felt medically stable for transfer to the telemetry unit on POD #3.  He continues to make progress.  He continues to maintain NSR.  His pacing wires were removed without difficulty.  He was mildly hypertensive and was started on low dose ACE. He developed mild constipation which resolved with use of laxative agents.  He was ambulating without difficulty.  He is felt medically stable for discharge home today.     Significant Diagnostic Studies: angiography:   Coronary Findings   Dominance: Right  Left Main  Vessel is angiographically normal.  Left Anterior Descending  The mid and distal LAD appear to be suitable targets for grafting  Prox LAD lesion, 95% stenosed. The LAD is diseased back to the ostium. there is a critical stenosis approximately 8-10 mm beyond the left main in the proximal LAD  First Diagonal Branch  Vessel is small in size.  Ost 1st Diag to 1st Diag lesion, 95% stenosed. small, severely diseased diagonal branch  Left Circumflex  Ost Cx lesion, 90% stenosed. The lesion is concentric.  Prox Cx to Mid Cx lesion, 60% stenosed.  Mid Cx lesion, 80% stenosed.  First Obtuse Marginal Branch  Patent first OM divides into twin branches  Second Obtuse Marginal Branch  Vessel is small in size.  Ost 2nd Mrg to 2nd Mrg lesion, 99% stenosed. small severely diseased  vessel  Right Coronary Artery  Prox RCA lesion, 40% stenosed. The lesion is eccentric.  Mid RCA lesion, 50% stenosed.   Treatments: surgery:   Coronary artery bypass grafting x3 with left internal mammary to the left anterior descending coronary artery, reverse saphenous vein graft to the diagonal coronary artery, reverse saphenous vein graft to the second obtuse marginal with right  thigh and calf greater saphenous vein harvesting endoscopically.  Disposition: 01-Home or Self Care  Discharge medications:  The patient has been discharged on:   1.Beta Blocker:  Yes [ x  ]                              No   [   ]                              If No, reason:  2.Ace Inhibitor/ARB: Yes [x   ]                                     No  [    ]                                     If No, reason:  3.Statin:   Yes [ x  ]                  No  [   ]                  If No, reason:  4.Ecasa:  Yes  [ x  ]                  No   [   ]                  If No, reason:  Discharge Instructions    Amb Referral to Cardiac Rehabilitation    Complete by:  As directed    Diagnosis:   CABG NSTEMI     CABG X ___:  3     Allergies as of 09/23/2016      Reactions   No Known Allergies       Medication List    STOP taking these medications   lisinopril-hydrochlorothiazide 20-12.5 MG tablet Commonly known as:  PRINZIDE,ZESTORETIC   simvastatin 40 MG tablet Commonly known as:  ZOCOR     TAKE these medications   acetaminophen 325 MG tablet Commonly known as:  TYLENOL Take 2 tablets (650 mg total) by mouth every 6 (six) hours as needed for mild pain.   aspirin 325 MG EC tablet Take 1 tablet (325 mg total) by mouth daily.   atorvastatin 80 MG tablet Commonly known as:  LIPITOR Take 1 tablet (80 mg total) by mouth daily at 6 PM.   glipiZIDE 10 MG tablet Commonly known as:  GLUCOTROL Take 1 tablet (10 mg total) by mouth 2 (two) times daily before a meal.   Insulin Detemir 100 UNIT/ML Pen Commonly known as:  LEVEMIR Inject 20 Units into the skin daily at 10 pm.   lisinopril 2.5 MG tablet Commonly known as:  PRINIVIL,ZESTRIL Take 1 tablet (2.5 mg total) by mouth daily.   metFORMIN 1000 MG  tablet Commonly known as:  GLUCOPHAGE Take 1 tablet (1,000 mg total) by mouth 2 (two) times daily with a meal.   metoprolol tartrate 25 MG tablet Commonly known as:   LOPRESSOR Take 1 tablet (25 mg total) by mouth 2 (two) times daily.   oxyCODONE 5 MG immediate release tablet Commonly known as:  Oxy IR/ROXICODONE Take 5 mg by mouth every 4-6 hours PRN severe pain.      Follow-up Information    [facility] [place] Follow up on 10/17/2016.   Specialty:  Cardiothoracic Surgery Why:  Appointment is at 1:00, please get CXR at 12:30 located at [facility] located on first floor of our office building Contact information: [address] [place], [address] [place] [address] [phone]       [name] T, PA-C Follow up on 10/04/2016.   Specialties:  Cardiology, Physician Assistant Why:  Appointment is at 11:15 Contact information: [address] [place] [address] [phone]        Hildred Laser, MD. Call.   Specialty:  Internal Medicine Why:  Call for an appointment within 1-2 weeks regarding further diabetes management and surveillance of HGA1C 9.6 Contact information: [address] [place] [address] [phone]           Signed: Lowella Dandy 09/23/2016, 8:02 AM

## 2016-09-20 NOTE — Progress Notes (Signed)
Removed wires without resistance. Wires intact and patient tolerated procedure well. Pt. On bed rest q 1hr with q15 min VS.

## 2016-09-20 NOTE — Progress Notes (Signed)
CARDIAC REHAB PHASE I   PRE:  Rate/Rhythm: 86 SR    BP: sitting 120/60    SaO2: 98 RA  MODE:  Ambulation: 150 ft   POST:  Rate/Rhythm: 96 SR    BP: sitting      SaO2: 98 RA  Pt walked short distance then his interpreter arrived. Encouraged him to walk x2 more long walks later. He can walk independently. Ed completed with pt through his cousin Kandis MannanOmar (he signed waiver to translate). Many family members arrived as well. Seemingly good reception. He knows he must decrease his sugar intake and decrease size of his carbs. Reinforced need for more vegetables. He would benefit from further DM ed. He is interested in [facility] and I will send referral to [place]. Apparently he does not have insurance and is asking to speak to a CSW regarding many issues. His plan is to d/c to his cousins house in [place]. Left videos for him to watch (DM and OHS).  [phone]   Harriet MassonRandi Kristan Atalia Litzinger CES, ACSM 09/20/2016 1:31 PM

## 2016-09-20 NOTE — Progress Notes (Signed)
Progress Note  Patient Name: Carl Clark Date of Encounter: 09/20/2016  Primary Cardiologist: Dr Excell Seltzer  Subjective   Chest soreness and constipation. No other complaints.   Inpatient Medications    Scheduled Meds: . aspirin EC  325 mg Oral Daily  . atorvastatin  80 mg Oral q1800  . Chlorhexidine Gluconate Cloth  6 each Topical Daily  . docusate sodium  200 mg Oral Daily  . furosemide  40 mg Oral Daily  . glipiZIDE  5 mg Oral BID AC  . insulin aspart  0-24 Units Subcutaneous TID AC & HS  . lactulose  20 g Oral Once  . lisinopril  5 mg Oral Daily  . mouth rinse  15 mL Mouth Rinse BID  . metFORMIN  1,000 mg Oral BID WC  . metoprolol tartrate  25 mg Oral BID  . mupirocin ointment  1 application Nasal BID  . pantoprazole  40 mg Oral QAC breakfast  . pneumococcal 23 valent vaccine  0.5 mL Intramuscular Tomorrow-1000  . potassium chloride  20 mEq Oral BID  . sodium chloride flush  3 mL Intravenous Q12H   Continuous Infusions: . sodium chloride     PRN Meds: sodium chloride, acetaminophen, bisacodyl **OR** bisacodyl, ondansetron **OR** ondansetron (ZOFRAN) IV, oxyCODONE, sodium chloride flush, traMADol   Vital Signs    Vitals:   09/19/16 0501 09/19/16 1440 09/19/16 2200 09/20/16 0518  BP: (!) 112/58 (!) 122/57 114/62 114/60  Pulse: 74 78 94 67  Resp: 18 19 16 17   Temp: 98.3 F (36.8 C) 99.8 F (37.7 C) (!) 100.5 F (38.1 C) 98.6 F (37 C)  TempSrc: Oral Oral Oral Oral  SpO2: 97% 99% 98% 98%  Weight: 132 lb 12.8 oz (60.2 kg)   128 lb 11.2 oz (58.4 kg)  Height:       No intake or output data in the 24 hours ending 09/20/16 0904 Filed Weights   09/18/16 1548 09/19/16 0501 09/20/16 0518  Weight: 138 lb 6.4 oz (62.8 kg) 132 lb 12.8 oz (60.2 kg) 128 lb 11.2 oz (58.4 kg)    Telemetry    Sinus rhythm - Personally Reviewed   Physical Exam  Alert, oriented, in NAD GEN: No acute distress.   Neck: No JVD Cardiac: RRR, no murmurs, rubs, or gallops.    Respiratory: Clear to auscultation bilaterally. GI: Soft, nontender, non-distended  MS: No edema; No deformity. Neuro:  Nonfocal  Psych: Normal affect   Labs    Chemistry Recent Labs Lab 09/14/16 0551  09/17/16 0419 09/18/16 0448 09/19/16 0226  NA 136  < > 130* 135 135  K 3.7  < > 3.9 3.8 4.1  CL 102  < > 100* 101 101  CO2 26  < > 26 30 28   GLUCOSE 258*  < > 79 102* 147*  BUN 21*  < > 14 13 19   CREATININE 1.04  < > 0.91 0.99 1.14  CALCIUM 9.1  < > 8.3* 8.5* 8.3*  PROT 6.9  --   --   --   --   ALBUMIN 3.1*  --   --   --   --   AST 14*  --   --   --   --   ALT 16*  --   --   --   --   ALKPHOS 56  --   --   --   --   BILITOT 0.8  --   --   --   --  GFRNONAA >60  < > >60 >60 >60  GFRAA >60  < > >60 >60 >60  ANIONGAP 8  < > 4* 4* 6  < > = values in this interval not displayed.   Hematology Recent Labs Lab 09/17/16 0419 09/18/16 0445 09/19/16 0226  WBC 9.2 6.3 6.4  RBC 2.90* 2.88* 2.89*  HGB 9.0* 8.9* 8.9*  HCT 27.6* 27.2* 27.1*  MCV 95.2 94.4 93.8  MCH 31.0 30.9 30.8  MCHC 32.6 32.7 32.8  RDW 12.6 12.4 12.4  PLT 148* 163 237    Cardiac EnzymesNo results for input(s): TROPONINI in the last 168 hours. No results for input(s): TROPIPOC in the last 168 hours.   BNPNo results for input(s): BNP, PROBNP in the last 168 hours.   DDimer No results for input(s): DDIMER in the last 168 hours.   Radiology    No results found.   Patient Profile     66 y.o. male with hyperlipidemia, type 2 diabetes mellitus, and multivessel CAD status post NSTEMI. He underwent CABG on June 28.  Assessment & Plan    1. NSTEMI - s/p multivessel CABG progressing well 2. Hyperlipidemia - now on high-intensity statin 3. Post-operative blood loss anemia 4. Type II DM - per TCTS team  Pt progressing well. Plans as outlined per TCTS team. Will arrange cardiology FU as outpatient. On ASA, atorvastatin, metoprolol, lisinopril.  Enzo BiSigned, Huan Pollok, MD  09/20/2016, 9:04 AM

## 2016-09-20 NOTE — Progress Notes (Signed)
Inpatient Diabetes Program Recommendations  AACE/ADA: New Consensus Statement on Inpatient Glycemic Control (2015)  Target Ranges:  Prepandial:   less than 140 mg/dL      Peak postprandial:   less than 180 mg/dL (1-2 hours)      Critically ill patients:  140 - 180 mg/dL   Lab Results  Component Value Date   GLUCAP 272 (H) 09/20/2016   HGBA1C 9.6 (H) 09/12/2016   Diabetes history: Type 2 diabetes Outpatient Diabetes medications: Glucotrol 10 mg bid, Levemir 20 units daily, Metformin 1000 mg bid Current orders for Inpatient glycemic control:  TCTS tid with meals and HS, Metformin 1000 mg bid, Glucotrol 5 mg bid with meals  Inpatient Diabetes Program Recommendations:   Please reduce Novolog correction to sensitive tid with meals and HS.  Also consider restarting Levemir 10 units daily.   Thanks, Beryl MeagerJenny Coleton Woon, RN, BC-ADM Inpatient Diabetes Coordinator Pager [phone] (8a-5p)

## 2016-09-21 LAB — GLUCOSE, CAPILLARY
GLUCOSE-CAPILLARY: 160 mg/dL — AB (ref 65–99)
Glucose-Capillary: 170 mg/dL — ABNORMAL HIGH (ref 65–99)
Glucose-Capillary: 186 mg/dL — ABNORMAL HIGH (ref 65–99)
Glucose-Capillary: 178 mg/dL — ABNORMAL HIGH (ref 65–99)

## 2016-09-21 MED ORDER — POLYETHYLENE GLYCOL 3350 17 G PO PACK
17.0000 g | PACK | Freq: Once | ORAL | Status: AC
  Administered 2016-09-21: 17 g via ORAL
  Filled 2016-09-21: qty 1

## 2016-09-21 MED ORDER — ACETAMINOPHEN 325 MG PO TABS: 650.0000 mg | ORAL_TABLET | Freq: Four times a day (QID) | ORAL | Status: DC | PRN

## 2016-09-21 MED ORDER — ASPIRIN 325 MG PO TBEC: 325.0000 mg | DELAYED_RELEASE_TABLET | Freq: Every day | ORAL | 0 refills | Status: DC

## 2016-09-21 MED ORDER — OXYCODONE HCL 5 MG PO TABS: ORAL_TABLET | ORAL | 0 refills | Status: DC

## 2016-09-21 MED ORDER — METOPROLOL TARTRATE 25 MG PO TABS: 25.0000 mg | ORAL_TABLET | Freq: Two times a day (BID) | ORAL | 1 refills | Status: DC

## 2016-09-21 MED ORDER — ATORVASTATIN CALCIUM 80 MG PO TABS: 80.0000 mg | ORAL_TABLET | Freq: Every day | ORAL | 1 refills | Status: DC

## 2016-09-21 NOTE — Progress Notes (Signed)
Pt called up to desk requesting assistance. Less than 2 minutes later pt walked up to nursing station complaining of needing water. Pt has been given instruction to not get up without assistance. Pt then deliberately laid on floor in front of nurse's station. Witnessed by Reather LittlerLaToya R NT, Adam PhenixGabe S RN, and myself. Pt denies pain, denies injury. Per CCMD not rhythm changes. Complains of needing water. Pt assisted to wheelchair. Given cup of water. Pt continues to yell about need for water in hallway. Pt assisted back to room, given pitcher of water, bed alarm on. PA Doree Fudgeonielle [name] made aware, no new orders received.   Leonidas Rombergaitlin S Bumbledare, RN

## 2016-09-21 NOTE — Progress Notes (Signed)
Patient with complaints of dizziness upon standing, VSS, Dr. Tyrone SageGerhardt aware and has seen patient. Encouraged patient to call prior to ambulating and going to restroom will continue to monitor patient. Olivier Frayre, Randall AnKristin Jessup RN

## 2016-09-21 NOTE — Progress Notes (Addendum)
      [address]       [place] [id number]             [phone]        6 Days Post-Op Procedure(s) (LRB): CORONARY ARTERY BYPASS GRAFTING (CABG) x 3, LIMA to LAD, SVG to DIAGONAL, SVG to OM2, USING LEFT MAMMARY ARTERY AND RIGHT GREATER SAPHENOUS VEIN HARVESTED ENDOSCOPICALLY (N/A) TRANSESOPHAGEAL ECHOCARDIOGRAM (TEE) (N/A)  Subjective: Patient states "food no good" so not eating much. No bowel movement yet.  Objective: Vital signs in last 24 hours: Temp:  [98.1 F (36.7 C)-98.2 F (36.8 C)] 98.1 F (36.7 C) (07/04 0434) Pulse Rate:  [65-78] 65 (07/04 0434) Cardiac Rhythm: Normal sinus rhythm (07/04 0700) Resp:  [17-18] 18 (07/04 0434) BP: (109-125)/(48-68) 117/61 (07/04 0434) SpO2:  [96 %-99 %] 96 % (07/04 0434) Weight:  [58 kg (127 lb 12.8 oz)] 58 kg (127 lb 12.8 oz) (07/04 0434)  Pre op weight 65.7 kg Current Weight  09/21/16 58 kg (127 lb 12.8 oz)      Intake/Output from previous day: No intake/output data recorded.   Physical Exam:  Cardiovascular: RRR Pulmonary: Clear to auscultation bilaterally Abdomen: Soft, non tender, bowel sounds present. Extremities: No lower extremity edema. Wounds: Clean and dry.  No erythema or signs of infection.  Lab Results: CBC: Recent Labs  09/19/16 0226  WBC 6.4  HGB 8.9*  HCT 27.1*  PLT 237   BMET:  Recent Labs  09/19/16 0226  NA 135  K 4.1  CL 101  CO2 28  GLUCOSE 147*  BUN 19  CREATININE 1.14  CALCIUM 8.3*    PT/INR:  Lab Results  Component Value Date   INR 1.57 09/15/2016   INR 0.99 09/14/2016   INR 1.06 09/12/2016   ABG:  INR: Will add last result for INR, ABG once components are confirmed Will add last 4 CBG results once components are confirmed  Assessment/Plan:  1. CV - S/p NSTEMI. SR in the 60's. On Lopressor 25 mg bid, Lisinopril 5 mg daily. 2.  Pulmonary - On room air. Encourage incentive spirometer. 3.  Acute blood loss anemia - Last H and H stable at 8.9 and  27.2 4. DM-CBGs 217/107/170. On Metformin 1000 mg bid, Glipizide 5 mg bid, and Insulin. Pre op HGA1C 9.6. He will need close follow up with his medical doctor after discharge. 5. LOC constipation 6. Will discharge once has bowel movement  [name],[name] MPA-C 09/21/2016,7:25 AM   Patient says he got dizzy while in bathroom , will wait until tomorrow to go home   I have seen and examined Carl Clark and agree with the above assessment  and plan.  Delight OvensEdward B Aalayah Riles MD Bee[facility] [phone] Office [phone] 09/21/2016 11:51 AM

## 2016-09-22 LAB — GLUCOSE, CAPILLARY
GLUCOSE-CAPILLARY: 62 mg/dL — AB (ref 65–99)
Glucose-Capillary: 149 mg/dL — ABNORMAL HIGH (ref 65–99)
Glucose-Capillary: 234 mg/dL — ABNORMAL HIGH (ref 65–99)
Glucose-Capillary: 224 mg/dL — ABNORMAL HIGH (ref 65–99)

## 2016-09-22 MED ORDER — LISINOPRIL 2.5 MG PO TABS: 2.5000 mg | ORAL_TABLET | Freq: Every day | ORAL | 1 refills | Status: DC

## 2016-09-22 MED ORDER — POLYETHYLENE GLYCOL 3350 17 G PO PACK
17.0000 g | PACK | Freq: Every day | ORAL | Status: DC
  Administered 2016-09-22 – 2016-09-23 (×2): 17 g via ORAL
  Filled 2016-09-22 (×2): qty 1

## 2016-09-22 MED ORDER — LISINOPRIL 2.5 MG PO TABS
2.5000 mg | ORAL_TABLET | Freq: Every day | ORAL | Status: DC
  Administered 2016-09-22 – 2016-09-23 (×2): 2.5 mg via ORAL
  Filled 2016-09-22 (×2): qty 1

## 2016-09-22 MED ORDER — MAGNESIUM CITRATE PO SOLN
1.0000 | Freq: Once | ORAL | Status: AC
  Administered 2016-09-22: 1 via ORAL
  Filled 2016-09-22: qty 296

## 2016-09-22 NOTE — Progress Notes (Signed)
Chest tube sutures removed. Benzoin and steri-strips applied. Sites clean and dry. Pt in bed with call light within reach. Will continue current plan of care.  Berdine DanceLauren Moffitt BSN, RN

## 2016-09-22 NOTE — Plan of Care (Signed)
Problem: Bowel/Gastric: Goal: Gastrointestinal status for postoperative course will improve Outcome: Adequate for Discharge Had a bowel movement today.

## 2016-09-22 NOTE — Progress Notes (Addendum)
      [address]       [place] [id number]             [phone]        7 Days Post-Op Procedure(s) (LRB): CORONARY ARTERY BYPASS GRAFTING (CABG) x 3, LIMA to LAD, SVG to DIAGONAL, SVG to OM2, USING LEFT MAMMARY ARTERY AND RIGHT GREATER SAPHENOUS VEIN HARVESTED ENDOSCOPICALLY (N/A) TRANSESOPHAGEAL ECHOCARDIOGRAM (TEE) (N/A)  Subjective: Patient states feels hot and has sternal incisional pain this am. He has not had a bowel movement yet.  Objective: Vital signs in last 24 hours: Temp:  [97.6 F (36.4 C)-98.2 F (36.8 C)] 98.2 F (36.8 C) (07/05 0517) Pulse Rate:  [62-73] 70 (07/05 0517) Cardiac Rhythm: Normal sinus rhythm (07/04 1900) Resp:  [18] 18 (07/05 0517) BP: (106-135)/(50-63) 111/51 (07/05 0517) SpO2:  [95 %-99 %] 96 % (07/05 0517) Weight:  [57.8 kg (127 lb 8 oz)] 57.8 kg (127 lb 8 oz) (07/05 0517)  Pre op weight 65.7 kg Current Weight  09/22/16 57.8 kg (127 lb 8 oz)      Intake/Output from previous day: 07/04 0701 - 07/05 0700 In: 100 [P.O.:100] Out: -    Physical Exam:  Cardiovascular: RRR Pulmonary: Clear to auscultation bilaterally Abdomen: Soft, non tender, bowel sounds present. Extremities: No lower extremity edema. Wounds: Clean and dry.  No erythema or signs of infection.  Lab Results: CBC:No results for input(s): WBC, HGB, HCT, PLT in the last 72 hours. BMET: No results for input(s): NA, K, CL, CO2, GLUCOSE, BUN, CREATININE, CALCIUM in the last 72 hours.  PT/INR:  Lab Results  Component Value Date   INR 1.57 09/15/2016   INR 0.99 09/14/2016   INR 1.06 09/12/2016   ABG:  INR: Will add last result for INR, ABG once components are confirmed Will add last 4 CBG results once components are confirmed  Assessment/Plan:  1. CV - S/p NSTEMI. SR in the 60's. On Lopressor 25 mg bid, Lisinopril 5 mg daily. Will decrease Lisinopril to 2.5 mg as SBP lower and had dizziness yesterday. 2.  Pulmonary - On room air. Encourage  incentive spirometer. 3.  Acute blood loss anemia - Last H and H stable at 8.9 and 27.2 4. DM-CBGs 186/178/149. On Metformin 1000 mg bid, Glipizide 5 mg bid, and Insulin. Pre op HGA1C 9.6. He will need close follow up with his medical doctor after discharge. 5. Will discuss disposition with Dr. Tyrone SageGerhardt.  [name],[name] MPA-C 09/22/2016,7:13 AM   Plan home today after bowels move  I have seen and examined Angeline SlimAbdullah Bakheet Barkett and agree with the above assessment  and plan.  Delight OvensEdward B Khristine Verno MD Bee[facility] [phone] Office [phone] 09/22/2016 8:56 AM

## 2016-09-22 NOTE — Progress Notes (Signed)
CARDIAC REHAB PHASE I   PRE:  Rate/Rhythm: 60 SR  BP:  Sitting: 111/50        SaO2: 100 RA  MODE:  Ambulation: 450 ft   POST:  Rate/Rhythm: 68 SR  BP:  Sitting: 115/58         SaO2: 97 RA  Pt required minimal assistance oob. Pt ambulated 450 ft on RA, handheld assist, slow, steady gait, tolerated well. Pt c/o soreness in his chest, denies any other complaints, declined rest stop. Encouraged additional ambulation today with family. Pt to bed per pt request after walk, call bell within reach. Will follow if pt does not discharge today.  [phone] Joylene GrapesEmily C Oceania Noori, RN, BSN 09/22/2016 11:27 AM

## 2016-09-23 LAB — GLUCOSE, CAPILLARY: Glucose-Capillary: 150 mg/dL — ABNORMAL HIGH (ref 65–99)

## 2016-09-23 NOTE — Progress Notes (Addendum)
      [address]       [place] [id number]             [phone]      8 Days Post-Op Procedure(s) (LRB): CORONARY ARTERY BYPASS GRAFTING (CABG) x 3, LIMA to LAD, SVG to DIAGONAL, SVG to OM2, USING LEFT MAMMARY ARTERY AND RIGHT GREATER SAPHENOUS VEIN HARVESTED ENDOSCOPICALLY (N/A) TRANSESOPHAGEAL ECHOCARDIOGRAM (TEE) (N/A)   Subjective:  Continues to have mild chest soreness.  Moved his bowels last night.  He is questioning about getting SS and Disability benefits  Objective: Vital signs in last 24 hours: Temp:  [97.4 F (36.3 C)-98.6 F (37 C)] 98.6 F (37 C) (07/06 0433) Pulse Rate:  [63-82] 68 (07/06 0433) Cardiac Rhythm: Normal sinus rhythm (07/06 0700) Resp:  [18] 18 (07/06 0433) BP: (95-116)/(52-57) 112/57 (07/06 0433) SpO2:  [95 %-100 %] 98 % (07/06 0433) Weight:  [126 lb 14.4 oz (57.6 kg)] 126 lb 14.4 oz (57.6 kg) (07/06 0433)  General appearance: alert, cooperative and no distress Heart: regular rate and rhythm Lungs: clear to auscultation bilaterally Abdomen: soft, non-tender; bowel sounds normal; no masses,  no organomegaly Extremities: edema trace Wound: clean and dry  Lab Results: No results for input(s): WBC, HGB, HCT, PLT in the last 72 hours. BMET: No results for input(s): NA, K, CL, CO2, GLUCOSE, BUN, CREATININE, CALCIUM in the last 72 hours.  PT/INR: No results for input(s): LABPROT, INR in the last 72 hours. ABG    Component Value Date/Time   PHART 7.386 09/15/2016 1950   HCO3 24.0 09/15/2016 1950   TCO2 26 09/16/2016 1734   ACIDBASEDEF 1.0 09/15/2016 1951   O2SAT 99.0 09/15/2016 1950   CBG (last 3)   Recent Labs  09/22/16 1629 09/22/16 2035 09/23/16 0559  GLUCAP 62* 224* 150*    Assessment/Plan: S/P Procedure(s) (LRB): CORONARY ARTERY BYPASS GRAFTING (CABG) x 3, LIMA to LAD, SVG to DIAGONAL, SVG to OM2, USING LEFT MAMMARY ARTERY AND RIGHT GREATER SAPHENOUS VEIN HARVESTED ENDOSCOPICALLY (N/A) TRANSESOPHAGEAL  ECHOCARDIOGRAM (TEE) (N/A)  1. CV- NSTEMI, hemodynamically stable, dizziness improved with decrease in lisinopril 2. Pulm- no acute issues, continue IS 3. DM- sugars are elevated, uncontrolled, preoperatively, needs to establish PCP for optimal diabetes management  4. GI- constipation resolved 5. Dispo- patient stable, will d/c home today   LOS: 11 days    BARRETT, ERIN 09/23/2016  Plan home today  I have seen and examined Angeline SlimAbdullah Bakheet Ch[place]te and agree with the above assessment  and plan.  Delight OvensEdward B Charidy Cappelletti MD Bee[facility] [phone] Office [phone] 09/23/2016 9:31 AM

## 2016-09-23 NOTE — Care Management Note (Signed)
Case Management Note  Patient Details  Name: Angeline Slimbdullah Bakheet Amos MRN: [id number] Date of Birth: 02/10/1951  Subjective/Objective:                 Spoke with patient and family at bedside yesterday. Patient will return home with family assisting in his care. Patient has RW at home. No HH needs identified. Chart reviewed at time of DC order, no CM consults, orders, HH needs, or medication needs identified.    Action/Plan:  DC to home w family.  Expected Discharge Date:  09/23/16               Expected Discharge Plan:  Home/Self Care  In-House Referral:     Discharge planning Services  CM Consult  Post Acute Care Choice:    Choice offered to:     DME Arranged:    DME Agency:     HH Arranged:    HH Agency:     Status of Service:  Completed, signed off  If discussed at [employer] Length of Stay Meetings, dates discussed:    Additional Comments:  Lawerance SabalDebbie Consuelo Suthers, RN 09/23/2016, 8:45 AM

## 2016-09-29 ENCOUNTER — Telehealth: Payer: Self-pay

## 2016-09-29 ENCOUNTER — Telehealth (HOSPITAL_COMMUNITY): Payer: Self-pay

## 2016-09-29 NOTE — Telephone Encounter (Signed)
Patient insurance is active and benefits verified. Patient has BCBS - no co-payment, no deductible, no out of pocket, no pre-authorization and no limit on visit. Passport/reference [id number].  Patient will be contacted and schedule after their follow up appointment with the cardiologist on 10/04/16 and surgeon on 10/17/16, upon review by [facility] navigator.

## 2016-10-04 ENCOUNTER — Encounter (INDEPENDENT_AMBULATORY_CARE_PROVIDER_SITE_OTHER): Payer: Self-pay

## 2016-10-04 ENCOUNTER — Ambulatory Visit: Payer: BLUE CROSS/BLUE SHIELD | Admitting: Physician Assistant

## 2016-10-04 ENCOUNTER — Telehealth: Payer: Self-pay | Admitting: *Deleted

## 2016-10-04 NOTE — Telephone Encounter (Signed)
Walk-in pt accompany by a family member pt came in for appointment which has been canceled and rescheduled, pt was not aware. Pt wanted to be checked because he states that his HR goes up when he exercise. Pt had CABG'S on 6/28 th pt was D/C home on 7/6.  Pt does not appears to be under any stress, denies SOB. Family member states that yesterday when he was exercising his heart rate went up. Pt was made aware that it is too early for him to start exercising.  Pt needs to get his strength back  first. Pt was made aware that he needs to start doing activities of daily living, and  that wait he will get his strength back. Once he is seen  On his Post op visit on 7/23 rd and his cardiac surgeon on 7/30, pt  will possible  be referred for cardiac rehab. Pt [facility]. Pt verbalized understanding.

## 2016-10-10 ENCOUNTER — Ambulatory Visit (INDEPENDENT_AMBULATORY_CARE_PROVIDER_SITE_OTHER): Payer: Self-pay | Admitting: Physician Assistant

## 2016-10-10 ENCOUNTER — Encounter: Payer: Self-pay | Admitting: Physician Assistant

## 2016-10-10 VITALS — BP 126/78 | HR 83 | Ht 68.0 in | Wt 131.0 lb

## 2016-10-10 DIAGNOSIS — I214 Non-ST elevation (NSTEMI) myocardial infarction: Secondary | ICD-10-CM | POA: Diagnosis not present

## 2016-10-10 DIAGNOSIS — R630 Anorexia: Secondary | ICD-10-CM

## 2016-10-10 DIAGNOSIS — Z79899 Other long term (current) drug therapy: Secondary | ICD-10-CM | POA: Diagnosis not present

## 2016-10-10 DIAGNOSIS — I10 Essential (primary) hypertension: Secondary | ICD-10-CM

## 2016-10-10 DIAGNOSIS — E785 Hyperlipidemia, unspecified: Secondary | ICD-10-CM

## 2016-10-10 DIAGNOSIS — Z794 Long term (current) use of insulin: Secondary | ICD-10-CM

## 2016-10-10 DIAGNOSIS — E119 Type 2 diabetes mellitus without complications: Secondary | ICD-10-CM

## 2016-10-10 DIAGNOSIS — I25810 Atherosclerosis of coronary artery bypass graft(s) without angina pectoris: Secondary | ICD-10-CM

## 2016-10-10 NOTE — Progress Notes (Signed)
Interpreter services performed by [employer] with [employer], #ID [phone].

## 2016-10-10 NOTE — Patient Instructions (Signed)
Medication Instructions:   No changes  Labwork:   BMET today  Lipid and liver test in 6-8 weeks   Testing/Procedures:  none  Follow-Up:  With Dr. Excell Seltzerooper in 2 months   If you need a refill on your cardiac medications before your next appointment, please call your pharmacy.

## 2016-10-10 NOTE — Progress Notes (Signed)
Cardiology Office Note    Date:  10/11/2016   ID:  Carl Clark 24-Dec-1950, MRN [id number]  PCP:  Hildred Laser, MD  Cardiologist:  Dr. Excell Seltzer  Chief Complaint  Patient presents with  . Follow-up    Incision hurts when he lifts over 50lbs.  . Tachycardia    With exercise.  . Chest Pain    History of Present Illness:  Carl Clark is a 66 y.o. male with PMH of HTN, HLD, DM II and CAD. Patient speak Arabic. Patient presented to the hospital on 09/12/2016 with chest pain, diaphoresis and shortness of breath. He drove himself to the ER. Initial point-of-care troponin was 2.14. Glucose was over 300. Lipid panel obtained on 09/12/2016 showed LDL 123, HDL 40, cholesterol 192, triglycerides 147. Hemoglobin A1c was 9.6 which point to new DM II. TSH was normal. Cardiac catheterization performed on 09/12/2016 showed severe three-vessel disease, EF greater than 65. CT surgery team was consulted. Carotid ultrasound showed 1-39% stenosis. He eventually underwent CABG 3 by Dr. Tyrone Sage with LIMA to LAD, SVG to diagonal and SVG to OM 2. Intraoperative TEE showed EF 60-65%. No significant valvular issues.  He presents today for cardiology office visit. He is chest continued to be sore and sometimes itches. Sternal scar well-healed without any significant drainage. He says he is not going back to his previous job as it will require him to carry too much heavy object. Otherwise he has been doing very well denied significant shortness of breath. I am okay for him to go back to driving given how stable he is. He his appetite has not been back yet. Otherwise he is tolerating both lisinopril and the metoprolol very well. I will obtain a basic metabolic panel today to assess his renal function and electrolytes. He will need LFT and fasting lipid panel in 6-8 weeks. He says sometimes when he exercises, he can feel his heart beats faster, however his heart rate returned to normal afterward.  I do not think he needed a heart monitor at this time.  Today's interview was conducted via video based Arabic translation service. Translator: Mr. Jordan Likes [id number]   Past Medical History:  Diagnosis Date  . Family history of anesthesia complication    "never had anesthesia" (07/14/2012)  . Hematochezia    hospitalized 07/13/2012  . History of rectal bleeding    secondary to diverticulum affecting right hepatic flexure - 06/2012  . Hypertension   . Type II diabetes mellitus (HCC)     Past Surgical History:  Procedure Laterality Date  . COLONOSCOPY Left 07/14/2012   Procedure: COLONOSCOPY;  Surgeon: Willis Modena, MD;  Location: [facility] ENDOSCOPY;  Service: Endoscopy;  Laterality: Left;  . CORONARY ARTERY BYPASS GRAFT N/A 09/15/2016   Procedure: CORONARY ARTERY BYPASS GRAFTING (CABG) x 3, LIMA to LAD, SVG to DIAGONAL, SVG to OM2, USING LEFT MAMMARY ARTERY AND RIGHT GREATER SAPHENOUS VEIN HARVESTED ENDOSCOPICALLY;  Surgeon: Delight Ovens, MD;  Location: [facility] OR;  Service: Open Heart Surgery;  Laterality: N/A;  . ESOPHAGOGASTRODUODENOSCOPY N/A 07/13/2012   Procedure: ESOPHAGOGASTRODUODENOSCOPY (EGD);  Surgeon: Willis Modena, MD;  Location: [facility] ENDOSCOPY;  Service: Endoscopy;  Laterality: N/A;  pt in ED A8  . LEFT HEART CATH AND CORONARY ANGIOGRAPHY N/A 09/12/2016   Procedure: Left Heart Cath and Coronary Angiography;  Surgeon: Tonny Bollman, MD;  Location: [facility] INVASIVE CV LAB;  Service: Cardiovascular;  Laterality: N/A;  . NO PAST SURGERIES    . TEE WITHOUT CARDIOVERSION N/A 09/15/2016  Procedure: TRANSESOPHAGEAL ECHOCARDIOGRAM (TEE);  Surgeon: Delight Ovens, MD;  Location: [facility] OR;  Service: Open Heart Surgery;  Laterality: N/A;    Current Medications: Outpatient Medications Prior to Visit  Medication Sig Dispense Refill  . acetaminophen (TYLENOL) 325 MG tablet Take 2 tablets (650 mg total) by mouth every 6 (six) hours as needed for mild pain.    Marland Kitchen aspirin EC 325 MG EC tablet Take 1 tablet  (325 mg total) by mouth daily. 30 tablet 0  . atorvastatin (LIPITOR) 80 MG tablet Take 1 tablet (80 mg total) by mouth daily at 6 PM. 30 tablet 1  . glipiZIDE (GLUCOTROL) 10 MG tablet Take 1 tablet (10 mg total) by mouth 2 (two) times daily before a meal. 30 tablet 2  . Insulin Detemir (LEVEMIR) 100 UNIT/ML Pen Inject 20 Units into the skin daily at 10 pm. 15 mL 2  . lisinopril (PRINIVIL,ZESTRIL) 2.5 MG tablet Take 1 tablet (2.5 mg total) by mouth daily. 30 tablet 1  . metFORMIN (GLUCOPHAGE) 1000 MG tablet Take 1 tablet (1,000 mg total) by mouth 2 (two) times daily with a meal. 30 tablet 1  . metoprolol tartrate (LOPRESSOR) 25 MG tablet Take 1 tablet (25 mg total) by mouth 2 (two) times daily. 60 tablet 1  . oxyCODONE (OXY IR/ROXICODONE) 5 MG immediate release tablet Take 5 mg by mouth every 4-6 hours PRN severe pain. 30 tablet 0   No facility-administered medications prior to visit.      Allergies:   No known allergies   Social History   Social History  . Marital status: Widowed    Spouse name: N/A  . Number of children: N/A  . Years of education: N/A   Social History Main Topics  . Smoking status: Never Smoker  . Smokeless tobacco: Never Used  . Alcohol use No  . Drug use: No  . Sexual activity: Not Currently   Other Topics Concern  . None   Social History Narrative  . None     Family History:  The patient's family history includes Diabetes in his father and mother.   ROS:   Please see the history of present illness.    ROS All other systems reviewed and are negative.   PHYSICAL EXAM:   VS:  BP 126/78   Pulse 83   Ht 5\' 8"  (1.727 m)   Wt 131 lb (59.4 kg)   BMI 19.92 kg/m    GEN: Well nourished, well developed, in no acute distress  HEENT: normal  Neck: no JVD, carotid bruits, or masses Cardiac: RRR; no murmurs, rubs, or gallops,no edema  Respiratory:  clear to auscultation bilaterally, normal work of breathing GI: soft, nontender, nondistended, + BS MS: no  deformity or atrophy  Skin: warm and dry, no rash Neuro:  Alert and Oriented x 3, Strength and sensation are intact Psych: euthymic mood, full affect  Wt Readings from Last 3 Encounters:  10/10/16 131 lb (59.4 kg)  09/23/16 126 lb 14.4 oz (57.6 kg)  09/30/15 150 lb (68 kg)      Studies/Labs Reviewed:   EKG:  EKG is ordered today.  The ekg ordered today demonstrates Normal sinus rhythm, T-wave inversion in inferolateral leads  Recent Labs: 09/12/2016: TSH 1.064 09/14/2016: ALT 16 09/16/2016: Magnesium 1.8 09/19/2016: Hemoglobin 8.9; Platelets 237 10/10/2016: BUN 13; Creatinine, Ser 0.85; Potassium 5.1; Sodium 137   Lipid Panel    Component Value Date/Time   CHOL 192 09/12/2016 0806   TRIG 147 09/12/2016 0806  HDL 40 (L) 09/12/2016 0806   CHOLHDL 4.8 09/12/2016 0806   VLDL 29 09/12/2016 0806   LDLCALC 123 (H) 09/12/2016 0806    Additional studies/ records that were reviewed today include:   Cath 09/13/2015 Conclusion   1. Severe proximal LAD stenosis  2. Severe proximal LCx stenosis 3. Mild-moderate RCA stenosis 4. Preserved LV systolic function with normal LVEF (>65%) and normal LVEDP  Pt essentially has left main equivalent with severe proximal/ostial LAD and LCx stenoses. Also has diffusely disease circumflex, OM, and diagonal branches. Recommend TCTS consult for CABG in setting of diabetes and severe multivessel CAD. Resume heparin post-cath.    CABG 09/15/2016 CORONARY ARTERY BYPASS GRAFTING x 3 -LIMA to LAD -SVG to DIAGONAL -SVG to OM2  ENDOSCOPIC HARVEST GREATER SAPHENOUS VEIN  -Right Leg  ASSESSMENT:    1. Coronary artery disease involving coronary bypass graft of native heart without angina pectoris   2. Essential hypertension   3. NSTEMI (non-ST elevated myocardial infarction) (HCC)   4. Encounter for long-term (current) use of medications   5. Hyperlipidemia, unspecified hyperlipidemia type   6. Controlled type 2 diabetes mellitus without  complication, with long-term current use of insulin (HCC)   7. Loss of appetite      PLAN:  In order of problems listed above:  1. CAD s/p CABG LIMA to LAD, SVG to diagonal, SVG to OM 2: He is doing very well, sternal scar well-healed. Other than soreness at the incision site, he does not have any other significant chest discomfort to suggest angina. Will defer to CT surgery service on when to reduce aspirin to 81 mg.  2. Hypertension: Blood pressure fairly stable at 126/78. Continue on metoprolol and lisinopril, obtain basic metabolic panel to make sure his renal function and electrolyte is stable on lisinopril  3. Hyperlipidemia: On Lipitor 80 mg daily. Fasting lipid panel and LFTs in 6-8 weeks.  4. DM 2: On insulin, glipizide and also metformin. We'll defer to primary care provider for further management.  5. Loss of appetite: Likely related to recent surgery, although can also occur as a side effect of metformin.    Medication Adjustments/Labs and Tests Ordered: Current medicines are reviewed at length with the patient today.  Concerns regarding medicines are outlined above.  Medication changes, Labs and Tests ordered today are listed in the Patient Instructions below. Patient Instructions  Medication Instructions:   No changes  Labwork:   BMET today  Lipid and liver test in 6-8 weeks   Testing/Procedures:  none  Follow-Up:  With Dr. Excell Seltzerooper in 2 months   If you need a refill on your cardiac medications before your next appointment, please call your pharmacy.      Ramond DialSigned, Jasir Rother, [place]  10/11/2016 9:32 PM    [facility] [address], [place], [place]  [address] Phone: [phone]; Fax: [phone]

## 2016-10-11 ENCOUNTER — Encounter: Payer: Self-pay | Admitting: Physician Assistant

## 2016-10-11 LAB — BASIC METABOLIC PANEL
BUN / CREAT RATIO: 15 (ref 10–24)
BUN: 13 mg/dL (ref 8–27)
CHLORIDE: 100 mmol/L (ref 96–106)
CO2: 24 mmol/L (ref 20–29)
Calcium: 9.5 mg/dL (ref 8.6–10.2)
Creatinine, Ser: 0.85 mg/dL (ref 0.76–1.27)
GFR, EST AFRICAN AMERICAN: 106 mL/min/{1.73_m2} (ref >59)
GFR, EST NON AFRICAN AMERICAN: 91 mL/min/{1.73_m2} (ref >59)
Glucose: 170 mg/dL — ABNORMAL HIGH (ref 65–99)
Potassium: 5.1 mmol/L (ref 3.5–5.2)
Sodium: 137 mmol/L (ref 134–144)

## 2016-10-11 NOTE — Progress Notes (Signed)
Renal function stable, electrolyte stable.

## 2016-10-13 ENCOUNTER — Other Ambulatory Visit: Payer: Self-pay | Admitting: Cardiothoracic Surgery

## 2016-10-13 DIAGNOSIS — Z951 Presence of aortocoronary bypass graft: Secondary | ICD-10-CM

## 2016-10-17 ENCOUNTER — Ambulatory Visit
Admission: RE | Admit: 2016-10-17 | Discharge: 2016-10-17 | Disposition: A | Discharge status: Home / Self Care | Payer: BLUE CROSS/BLUE SHIELD | Source: Ambulatory Visit | Attending: Cardiothoracic Surgery | Admitting: Cardiothoracic Surgery

## 2016-10-17 ENCOUNTER — Ambulatory Visit (INDEPENDENT_AMBULATORY_CARE_PROVIDER_SITE_OTHER): Payer: Self-pay | Admitting: Physician Assistant

## 2016-10-17 VITALS — BP 112/57 | HR 75 | Resp 16 | Ht 68.0 in | Wt 130.6 lb

## 2016-10-17 DIAGNOSIS — I2510 Atherosclerotic heart disease of native coronary artery without angina pectoris: Secondary | ICD-10-CM

## 2016-10-17 DIAGNOSIS — Z951 Presence of aortocoronary bypass graft: Secondary | ICD-10-CM

## 2016-10-17 MED ORDER — METFORMIN HCL 1000 MG PO TABS: 1000.0000 mg | ORAL_TABLET | Freq: Two times a day (BID) | ORAL | 3 refills | Status: DC

## 2016-10-17 MED ORDER — INSULIN DETEMIR 100 UNIT/ML FLEXPEN: 20.0000 [IU] | PEN_INJECTOR | Freq: Every day | SUBCUTANEOUS | 3 refills | Status: DC

## 2016-10-17 MED ORDER — GLIPIZIDE 10 MG PO TABS: 10.0000 mg | ORAL_TABLET | Freq: Two times a day (BID) | ORAL | 3 refills | Status: DC

## 2016-10-17 MED ORDER — ATORVASTATIN CALCIUM 80 MG PO TABS: 80.0000 mg | ORAL_TABLET | Freq: Every day | ORAL | 3 refills | Status: DC

## 2016-10-17 MED ORDER — METOPROLOL TARTRATE 25 MG PO TABS: 25.0000 mg | ORAL_TABLET | Freq: Two times a day (BID) | ORAL | 3 refills | Status: DC

## 2016-10-17 NOTE — Progress Notes (Signed)
HPI: Patient returns for routine postoperative follow-up having undergone CABG x 3 on 09/15/2016. The patient's early postoperative recovery while in the hospital was unremarkable. Since hospital discharge the patient reports he is doing okay.  He does have some sternal pain mostly when trying to sleep at night.  He also states of swelling around his lips that is associated with itching.  He is taking his medication as prescribed, except for ASA which he states he was not given a prescription for.  He questions when he can return to work and asks what he should do for financial assistance until that time. All translation was provided by Translator  Current Outpatient Prescriptions  Medication Sig Dispense Refill  . atorvastatin (LIPITOR) 80 MG tablet Take 1 tablet (80 mg total) by mouth daily at 6 PM. 30 tablet 3  . glipiZIDE (GLUCOTROL) 10 MG tablet Take 1 tablet (10 mg total) by mouth 2 (two) times daily before a meal. 60 tablet 3  . Insulin Detemir (LEVEMIR) 100 UNIT/ML Pen Inject 20 Units into the skin daily at 10 pm. 15 mL 3  . metFORMIN (GLUCOPHAGE) 1000 MG tablet Take 1 tablet (1,000 mg total) by mouth 2 (two) times daily with a meal. 60 tablet 3  . metoprolol tartrate (LOPRESSOR) 25 MG tablet Take 1 tablet (25 mg total) by mouth 2 (two) times daily. 60 tablet 3  . acetaminophen (TYLENOL) 325 MG tablet Take 2 tablets (650 mg total) by mouth every 6 (six) hours as needed for mild pain. (Patient not taking: Reported on 10/17/2016)    . aspirin EC 325 MG EC tablet Take 1 tablet (325 mg total) by mouth daily. (Patient not taking: Reported on 10/17/2016) 30 tablet 0   No current facility-administered medications for this visit.     Physical Exam:  BP (!) 112/57 (BP Location: Right Arm, Patient Position: Sitting, Cuff Size: Normal)   Pulse 75   Resp 16   Ht 5\' 8"  (1.727 m)   Wt 130 lb 9.6 oz (59.2 kg)   SpO2 97% Comment: ON RA  BMI 19.86 kg/m   Gen: no apparent distress Heart RRR Lungs:  CTA Abd: soft non-tender, non-distended Ext: no edema Incisions; well healed  Diagnostic Tests:  CXR; no pleural effusion, pneumothorax, wires in tact  A/P:  1. S/p CABG- doing well, lip swelling and itching could be mild angioedema.. BP is well controlled, will stop ACE 2. Pulm- no acute issues,  3. Travel-patient instructed cant leave country for 6-8 weeks post operatively 4. Sternal pain- tylenol prn 5. Dispo- patient doing well, provided refills for medications, but instructed patient these will need to be refilled by Cardiology/PCP RTW slip provided for Oct 1 with weight lifting restrictions Gave information on attempting to obtain disability 6. Per patient request RTC in 6 weeks to see Dr. Virgina NorfolkGerhardt   Emaly Boschert, PA-C [facility] [phone]

## 2016-10-17 NOTE — Patient Instructions (Signed)
Stop taking Lisinopril--- as likely the source for swelling of lips and itching around lips  Start taking Aspirin 325 mg once a day.. This can be purchased at your local pharmacy over the counter   Tylenol 500 mg--- 1 or 2 tablets every 6 hours as needed for pain--- can buy at local pharmacy over the counter

## 2016-11-04 ENCOUNTER — Telehealth (HOSPITAL_COMMUNITY): Payer: Self-pay

## 2016-11-07 ENCOUNTER — Encounter (HOSPITAL_COMMUNITY): Payer: Self-pay | Admitting: Cardiothoracic Surgery

## 2016-11-10 ENCOUNTER — Inpatient Hospital Stay (HOSPITAL_COMMUNITY): Admission: RE | Admit: 2016-11-10 | Payer: BLUE CROSS/BLUE SHIELD | Source: Ambulatory Visit

## 2016-11-11 ENCOUNTER — Telehealth (HOSPITAL_COMMUNITY): Payer: Self-pay | Admitting: *Deleted

## 2016-11-11 NOTE — Telephone Encounter (Signed)
Late entry:  Pt scheduled for cardiac rehab on 8/23 at 8:45 am with  Interpreter services requested for assistance.  Pt no show for this appointment. At 9:15 am pt called to see if he may be on his way.  Message left for pt to please contact to reschedule.  Interpreter staff dismissed. Interpreter asked for rehab staff to sign.

## 2016-11-14 ENCOUNTER — Ambulatory Visit (HOSPITAL_COMMUNITY): Payer: BLUE CROSS/BLUE SHIELD

## 2016-11-16 ENCOUNTER — Ambulatory Visit (HOSPITAL_COMMUNITY): Payer: BLUE CROSS/BLUE SHIELD

## 2016-11-18 ENCOUNTER — Ambulatory Visit (HOSPITAL_COMMUNITY): Payer: BLUE CROSS/BLUE SHIELD

## 2016-11-23 ENCOUNTER — Ambulatory Visit (HOSPITAL_COMMUNITY): Payer: BLUE CROSS/BLUE SHIELD

## 2016-11-25 ENCOUNTER — Ambulatory Visit (HOSPITAL_COMMUNITY): Payer: BLUE CROSS/BLUE SHIELD

## 2016-11-25 ENCOUNTER — Telehealth (HOSPITAL_COMMUNITY): Payer: Self-pay | Admitting: Pharmacy Technician

## 2016-11-28 ENCOUNTER — Ambulatory Visit (HOSPITAL_COMMUNITY): Payer: BLUE CROSS/BLUE SHIELD

## 2016-11-29 ENCOUNTER — Other Ambulatory Visit: Payer: Self-pay

## 2016-11-29 ENCOUNTER — Inpatient Hospital Stay (HOSPITAL_COMMUNITY)
Admission: RE | Admit: 2016-11-29 | Discharge: 2016-11-29 | Disposition: A | Discharge status: Home / Self Care | Payer: BLUE CROSS/BLUE SHIELD | Source: Ambulatory Visit

## 2016-11-29 DIAGNOSIS — I25118 Atherosclerotic heart disease of native coronary artery with other forms of angina pectoris: Secondary | ICD-10-CM

## 2016-11-29 NOTE — Progress Notes (Signed)
Pt a no show for his scheduled appt (with the assistance of translator).  Pt called on mobile phone with the scheduled interpreter.  No answer from pt.  With the assistance of the interpreter scheduled for today emergency contact person called.  Emergency contact - friend indicated that the patient was praying and must have forgotten about the appt. Advised the emergency contact designee to please pass the message for pt to please reach out to cardiac rehab.  Will cancel interpreter scheduled for subsequent appt. Pt must complete orientation in order to move forward with exercise. Alanson Alyarlette Carlton RN, BSN Cardiac and Emergency planning/management officerulmonary Rehab Nurse Navigator

## 2016-11-30 ENCOUNTER — Ambulatory Visit (HOSPITAL_COMMUNITY): Payer: BLUE CROSS/BLUE SHIELD

## 2016-12-01 ENCOUNTER — Ambulatory Visit: Payer: BLUE CROSS/BLUE SHIELD | Admitting: Cardiothoracic Surgery

## 2016-12-02 ENCOUNTER — Ambulatory Visit (HOSPITAL_COMMUNITY): Payer: BLUE CROSS/BLUE SHIELD

## 2016-12-05 ENCOUNTER — Ambulatory Visit (HOSPITAL_COMMUNITY): Payer: BLUE CROSS/BLUE SHIELD

## 2016-12-07 ENCOUNTER — Ambulatory Visit (HOSPITAL_COMMUNITY): Payer: BLUE CROSS/BLUE SHIELD

## 2016-12-09 ENCOUNTER — Ambulatory Visit (HOSPITAL_COMMUNITY): Payer: BLUE CROSS/BLUE SHIELD

## 2016-12-12 ENCOUNTER — Ambulatory Visit (HOSPITAL_COMMUNITY): Payer: BLUE CROSS/BLUE SHIELD

## 2016-12-14 ENCOUNTER — Ambulatory Visit (HOSPITAL_COMMUNITY): Payer: BLUE CROSS/BLUE SHIELD

## 2016-12-16 ENCOUNTER — Ambulatory Visit (HOSPITAL_COMMUNITY): Payer: BLUE CROSS/BLUE SHIELD

## 2016-12-19 ENCOUNTER — Ambulatory Visit (HOSPITAL_COMMUNITY): Payer: BLUE CROSS/BLUE SHIELD

## 2016-12-19 ENCOUNTER — Ambulatory Visit (INDEPENDENT_AMBULATORY_CARE_PROVIDER_SITE_OTHER): Payer: BLUE CROSS/BLUE SHIELD | Admitting: Cardiovascular Disease

## 2016-12-19 ENCOUNTER — Encounter: Payer: Self-pay | Admitting: Cardiovascular Disease

## 2016-12-19 VITALS — BP 150/70 | HR 66 | Ht 66.0 in | Wt 143.0 lb

## 2016-12-19 DIAGNOSIS — E118 Type 2 diabetes mellitus with unspecified complications: Secondary | ICD-10-CM

## 2016-12-19 DIAGNOSIS — E785 Hyperlipidemia, unspecified: Secondary | ICD-10-CM

## 2016-12-19 DIAGNOSIS — I2510 Atherosclerotic heart disease of native coronary artery without angina pectoris: Secondary | ICD-10-CM

## 2016-12-19 MED ORDER — GLIPIZIDE 10 MG PO TABS: 10.0000 mg | ORAL_TABLET | Freq: Two times a day (BID) | ORAL | 3 refills | Status: DC

## 2016-12-19 MED ORDER — ATORVASTATIN CALCIUM 80 MG PO TABS: 80.0000 mg | ORAL_TABLET | Freq: Every day | ORAL | 3 refills | Status: DC

## 2016-12-19 MED ORDER — LOSARTAN POTASSIUM 25 MG PO TABS: 25.0000 mg | ORAL_TABLET | Freq: Every day | ORAL | 3 refills | Status: DC

## 2016-12-19 MED ORDER — METFORMIN HCL 1000 MG PO TABS: 1000.0000 mg | ORAL_TABLET | Freq: Two times a day (BID) | ORAL | 3 refills | Status: DC

## 2016-12-19 MED ORDER — METOPROLOL TARTRATE 25 MG PO TABS: 25.0000 mg | ORAL_TABLET | Freq: Two times a day (BID) | ORAL | 3 refills | Status: DC

## 2016-12-19 NOTE — Progress Notes (Signed)
Cardiology Office Note Date:  12/20/2016   ID:  Carl Clark, Carl Clark 09/05/1950, MRN [id number]  PCP:  Jae Dire, MD  Cardiologist:  Sherren Mocha, MD    Chief Complaint  Patient presents with  . Follow-up     History of Present Illness: Carl Clark is a 66 y.o. male who presents for follow-up of CAD. The patient is arabic-speaking. He initially presented in June 2018 with NSTEMI and underwent cardiac catheterization demonstrating severe 3 vessel CAD. He ultimately was treated with multivessel CABG by Dr Servando Snare with a LIMA-LAD, SVG-diagonal, and SVG-OM. LV function is normal. His surgery was uncomplicated.   The patient is here alone today. He speaks a little bit of English and is able to communicate about his basic symptoms. He denies chest pain, shortness of breath, or edema. No palpitations or syncope. He has run out of all of his medications, having taken none since yesterday. He was unable to afford insulin.   Past Medical History:  Diagnosis Date  . Family history of anesthesia complication    "never had anesthesia" (07/14/2012)  . Hematochezia    hospitalized 07/13/2012  . History of rectal bleeding    secondary to diverticulum affecting right hepatic flexure - 06/2012  . Hypertension   . Type II diabetes mellitus ([place])     Past Surgical History:  Procedure Laterality Date  . COLONOSCOPY Left 07/14/2012   Procedure: COLONOSCOPY;  Surgeon: Arta Silence, MD;  Location: [facility] ENDOSCOPY;  Service: Endoscopy;  Laterality: Left;  . CORONARY ARTERY BYPASS GRAFT N/A 09/15/2016   Procedure: CORONARY ARTERY BYPASS GRAFTING (CABG) x 3, LIMA to LAD, SVG to DIAGONAL, SVG to OM2, USING LEFT MAMMARY ARTERY AND RIGHT GREATER SAPHENOUS VEIN HARVESTED ENDOSCOPICALLY;  Surgeon: Grace Isaac, MD;  Location: [place];  Service: Open Heart Surgery;  Laterality: N/A;  . ESOPHAGOGASTRODUODENOSCOPY N/A 07/13/2012   Procedure: ESOPHAGOGASTRODUODENOSCOPY (EGD);   Surgeon: Arta Silence, MD;  Location[facility] END[facility]COPY;  Service: Endoscopy;  Laterality: N/A;  pt in ED A8  . LEFT HEART CATH AND CORONARY ANGIOGRAPHY N/A 09/12/2016   Procedure: Left Heart Cath and Coronary Angiography;  Surgeon: Sherren Mocha, MD;  Location: [place] CV LAB;  Service: Cardiovascular;  Laterality: N/A;  . NO PAST SURGERIES    . TEE WITHOUT CARDIOVERSION N/A 09/15/2016   Procedure: TRANSESOPHAGEAL ECHOCARDIOGRAM (TEE);  Surgeon: Grace Isaac, MD;  Location: [place];  Service: Open Heart Surgery;  Laterality: N/A;    Current Outpatient Prescriptions  Medication Sig Dispense Refill  . acetaminophen (TYLENOL) 325 MG tablet Take 2 tablets (650 mg total) by mouth every 6 (six) hours as needed for mild pain.    Marland Kitchen aspirin EC 81 MG tablet Take 81 mg by mouth daily.    Marland Kitchen atorvastatin (LIPITOR) 80 MG tablet Take 1 tablet (80 mg total) by mouth daily at 6 PM. 90 tablet 3  . glipiZIDE (GLUCOTROL) 10 MG tablet Take 1 tablet (10 mg total) by mouth 2 (two) times daily before a meal. 180 tablet 3  . Insulin Detemir (LEVEMIR) 100 UNIT/ML Pen Inject 20 Units into the skin daily at 10 pm. 15 mL 3  . metFORMIN (GLUCOPHAGE) 1000 MG tablet Take 1 tablet (1,000 mg total) by mouth 2 (two) times daily with a meal. 180 tablet 3  . metoprolol tartrate (LOPRESSOR) 25 MG tablet Take 1 tablet (25 mg total) by mouth 2 (two) times daily. 180 tablet 3  . losartan (COZAAR) 25 MG tablet Take 1 tablet (25  mg total) by mouth daily. 90 tablet 3   No current facility-administered medications for this visit.     Allergies:   No known allergies   Social History:  The patient  reports that he has never smoked. He has never used smokeless tobacco. He reports that he does not drink alcohol or use drugs.   Family History:  The patient's family history includes Diabetes in his father and mother.    ROS:  Please see the history of present illness.  All other systems are reviewed and negative.    PHYSICAL  EXAM: VS:  BP (!) 150/70   Pulse 66   Ht '5\' 6"'  (1.676 m)   Wt 64.9 kg (143 lb)   BMI 23.08 kg/m  , BMI Body mass index is 23.08 kg/m. GEN: Well nourished, well developed, in no acute distress  HEENT: normal  Neck: no JVD, no masses. No carotid bruits Cardiac: RRR without murmur or gallop                Respiratory:  clear to auscultation bilaterally, normal work of breathing GI: soft, nontender, nondistended, + BS MS: no deformity or atrophy  Ext: no pretibial edema, pedal pulses 2+= bilaterally Skin: warm and dry, no rash Neuro:  Strength and sensation are intact Psych: euthymic mood, full affect  EKG:  EKG is not ordered today. Recent Labs: 09/12/2016: TSH 1.064 09/14/2016: ALT 16 09/16/2016: Magnesium 1.8 09/19/2016: Hemoglobin 8.9; Platelets 237 10/10/2016: BUN 13; Creatinine, Ser 0.85; Potassium 5.1; Sodium 137   Lipid Panel     Component Value Date/Time   CHOL 192 09/12/2016 0806   TRIG 147 09/12/2016 0806   HDL 40 (L) 09/12/2016 0806   CHOLHDL 4.8 09/12/2016 0806   VLDL 29 09/12/2016 0806   LDLCALC 123 (H) 09/12/2016 0806      Wt Readings from Last 3 Encounters:  12/19/16 64.9 kg (143 lb)  10/17/16 59.2 kg (130 lb 9.6 oz)  10/10/16 59.4 kg (131 lb)     Cardiac Studies Reviewed: Echo 09-14-2016: Study Conclusions  - Left ventricle: The cavity size was normal. Wall thickness was   increased in a pattern of mild LVH. Systolic function was normal.   The estimated ejection fraction was in the range of 60% to 65%.   Wall motion was normal; there were no regional wall motion   abnormalities. Doppler parameters are consistent with abnormal   left ventricular relaxation (grade 1 diastolic dysfunction). - Aortic valve: There was no stenosis. - Mitral valve: There was no significant regurgitation. - Right ventricle: The cavity size was normal. Systolic function   was normal. - Tricuspid valve: Peak RV-RA gradient (S): 33 mm Hg. - Pulmonary arteries: PA peak  pressure: 36 mm Hg (S). - Inferior vena cava: The vessel was normal in size. The   respirophasic diameter changes were in the normal range (= 50%),   consistent with normal central venous pressure.  Impressions:  - Normal LV size with mild LV hypertrophy. EF 60-65%. Normal RV   size and systolic function. No significant valvular   abnormalities. Borderline pulmonary hypertension.  Cath 09-12-2016: Conclusion   1. Severe proximal LAD stenosis  2. Severe proximal LCx stenosis 3. Mild-moderate RCA stenosis 4. Preserved LV systolic function with normal LVEF (>65%) and normal LVEDP  Pt essentially has left main equivalent with severe proximal/ostial LAD and LCx stenoses. Also has diffusely disease circumflex, OM, and diagonal branches. Recommend TCTS consult for CABG in setting of diabetes and severe multivessel  CAD. Resume heparin post-cath.   ASSESSMENT AND PLAN: 1.  CAD, s/p CABG: no angina. Continue ASA, beta-blocker, statin. Add losartan in setting diabetes and CAD.  2. Essential HTN: BP uncontrolled but he's out of medications. Refills called in. Losartan added. Continue metoprolol. Suspect compliance will be challenging.   3. Hyperlipidemia: treated with atorvastatin 80 mg daily. Due for follow-up lipids/LFT's. Baseline LDL is 123 mg/dL.   4. Type II DM: managed by his PCP, but not clear that he has seen anyone since discharge from the hospital. I will fill his oral hypoglycemics, check a HgB A1C, and check renal function/electrolytes.   Current medicines are reviewed with the patient today.  The patient does not have concerns regarding medicines.  Labs/ tests ordered today include:   Orders Placed This Encounter  Procedures  . Comp Met (CMET)  . Hemoglobin A1c  . Lipid panel    Disposition:   FU 3 months with APP.  Deatra James, MD  12/20/2016 4:39 PM    [facility] [place], [place], [place]  [address] Phone: [phone];  Fax: [phone]

## 2016-12-19 NOTE — Patient Instructions (Signed)
Medication Instructions:  1) START LOSARTAN 25 mg daily  Labwork: TODAY: CMET, Lipids, A1C  Testing/Procedures: None  Follow-Up: Your provider recommends that you schedule a follow-up appointment in 3 MONTHS with Dr. Earmon Phoenix assistant.  Any Other Special Instructions Will Be Listed Below (If Applicable).     If you need a refill on your cardiac medications before your next appointment, please call your pharmacy.

## 2016-12-19 NOTE — Addendum Note (Signed)
Addendum  created 12/19/16 0840 by Cecile Hearing, MD   Sign clinical note

## 2016-12-21 ENCOUNTER — Ambulatory Visit (HOSPITAL_COMMUNITY): Payer: BLUE CROSS/BLUE SHIELD

## 2016-12-23 ENCOUNTER — Ambulatory Visit (HOSPITAL_COMMUNITY): Payer: BLUE CROSS/BLUE SHIELD

## 2016-12-26 ENCOUNTER — Ambulatory Visit (HOSPITAL_COMMUNITY): Payer: BLUE CROSS/BLUE SHIELD

## 2016-12-28 ENCOUNTER — Ambulatory Visit (HOSPITAL_COMMUNITY): Payer: BLUE CROSS/BLUE SHIELD

## 2016-12-30 ENCOUNTER — Ambulatory Visit (HOSPITAL_COMMUNITY): Payer: BLUE CROSS/BLUE SHIELD

## 2017-01-02 ENCOUNTER — Ambulatory Visit (HOSPITAL_COMMUNITY): Payer: BLUE CROSS/BLUE SHIELD

## 2017-01-04 ENCOUNTER — Ambulatory Visit (HOSPITAL_COMMUNITY): Payer: BLUE CROSS/BLUE SHIELD

## 2017-01-06 ENCOUNTER — Ambulatory Visit (HOSPITAL_COMMUNITY): Payer: BLUE CROSS/BLUE SHIELD

## 2017-01-09 ENCOUNTER — Ambulatory Visit (HOSPITAL_COMMUNITY): Payer: BLUE CROSS/BLUE SHIELD

## 2017-01-11 ENCOUNTER — Ambulatory Visit (HOSPITAL_COMMUNITY): Payer: BLUE CROSS/BLUE SHIELD

## 2017-01-13 ENCOUNTER — Ambulatory Visit (HOSPITAL_COMMUNITY): Payer: BLUE CROSS/BLUE SHIELD

## 2017-01-16 ENCOUNTER — Ambulatory Visit (HOSPITAL_COMMUNITY): Payer: BLUE CROSS/BLUE SHIELD

## 2017-01-18 ENCOUNTER — Ambulatory Visit (HOSPITAL_COMMUNITY): Payer: BLUE CROSS/BLUE SHIELD

## 2017-01-20 ENCOUNTER — Ambulatory Visit (HOSPITAL_COMMUNITY): Payer: BLUE CROSS/BLUE SHIELD

## 2017-01-23 ENCOUNTER — Ambulatory Visit (HOSPITAL_COMMUNITY): Payer: BLUE CROSS/BLUE SHIELD

## 2017-01-25 ENCOUNTER — Ambulatory Visit (HOSPITAL_COMMUNITY): Payer: BLUE CROSS/BLUE SHIELD

## 2017-01-27 ENCOUNTER — Ambulatory Visit (HOSPITAL_COMMUNITY): Payer: BLUE CROSS/BLUE SHIELD

## 2017-01-30 ENCOUNTER — Ambulatory Visit (HOSPITAL_COMMUNITY): Payer: BLUE CROSS/BLUE SHIELD

## 2017-02-01 ENCOUNTER — Ambulatory Visit (HOSPITAL_COMMUNITY): Payer: BLUE CROSS/BLUE SHIELD

## 2017-02-03 ENCOUNTER — Ambulatory Visit (HOSPITAL_COMMUNITY): Payer: BLUE CROSS/BLUE SHIELD

## 2017-02-06 ENCOUNTER — Ambulatory Visit (HOSPITAL_COMMUNITY): Payer: BLUE CROSS/BLUE SHIELD

## 2017-02-08 ENCOUNTER — Ambulatory Visit (HOSPITAL_COMMUNITY): Payer: BLUE CROSS/BLUE SHIELD

## 2017-02-10 ENCOUNTER — Ambulatory Visit (HOSPITAL_COMMUNITY): Payer: BLUE CROSS/BLUE SHIELD

## 2017-02-13 ENCOUNTER — Ambulatory Visit (HOSPITAL_COMMUNITY): Payer: BLUE CROSS/BLUE SHIELD

## 2017-02-15 ENCOUNTER — Ambulatory Visit (HOSPITAL_COMMUNITY): Payer: BLUE CROSS/BLUE SHIELD

## 2017-02-17 ENCOUNTER — Ambulatory Visit (HOSPITAL_COMMUNITY): Payer: BLUE CROSS/BLUE SHIELD

## 2017-02-20 ENCOUNTER — Ambulatory Visit (HOSPITAL_COMMUNITY): Payer: BLUE CROSS/BLUE SHIELD

## 2017-02-22 ENCOUNTER — Ambulatory Visit (HOSPITAL_COMMUNITY): Payer: BLUE CROSS/BLUE SHIELD

## 2017-02-24 ENCOUNTER — Encounter: Payer: Self-pay | Admitting: Physician Assistant

## 2017-02-24 ENCOUNTER — Ambulatory Visit (HOSPITAL_COMMUNITY): Payer: BLUE CROSS/BLUE SHIELD

## 2017-02-27 ENCOUNTER — Ambulatory Visit (HOSPITAL_COMMUNITY): Payer: BLUE CROSS/BLUE SHIELD

## 2017-03-01 ENCOUNTER — Ambulatory Visit (HOSPITAL_COMMUNITY): Payer: BLUE CROSS/BLUE SHIELD

## 2017-03-03 ENCOUNTER — Ambulatory Visit (HOSPITAL_COMMUNITY): Payer: BLUE CROSS/BLUE SHIELD

## 2017-03-06 ENCOUNTER — Ambulatory Visit (HOSPITAL_COMMUNITY): Payer: BLUE CROSS/BLUE SHIELD

## 2017-03-08 ENCOUNTER — Ambulatory Visit (HOSPITAL_COMMUNITY): Payer: BLUE CROSS/BLUE SHIELD

## 2017-03-16 ENCOUNTER — Encounter: Payer: Self-pay | Admitting: Physician Assistant

## 2017-03-16 DIAGNOSIS — I25119 Atherosclerotic heart disease of native coronary artery with unspecified angina pectoris: Secondary | ICD-10-CM | POA: Insufficient documentation

## 2017-03-16 DIAGNOSIS — I2510 Atherosclerotic heart disease of native coronary artery without angina pectoris: Secondary | ICD-10-CM

## 2017-03-16 DIAGNOSIS — E785 Hyperlipidemia, unspecified: Secondary | ICD-10-CM | POA: Insufficient documentation

## 2017-03-16 HISTORY — DX: Atherosclerotic heart disease of native coronary artery without angina pectoris: I25.10

## 2017-03-16 NOTE — Progress Notes (Signed)
Cardiology Office Note:    Date:  03/17/2017   ID:  Carl SlimAbdullah Bakheet Clark, DOB 05/06/1950, MRN [id number]  PCP:  Carl Clark, Hatim, MD  Cardiologist:  Carl BollmanMichael Cooper, MD   Referring MD: Carl Clark, Hatim, MD   Chief Complaint  Patient presents with  . Follow-up    CAD    History of Present Illness:    Carl Clark is a 66 y.o. male with a hx of diabetes, hypertension, CAD status post non-ST elevation myocardial infarction in June 2018 treated with multivessel CABG by Dr. Tyrone SageGerhardt (LIMA-LAD, SVG-diagonal, SVG-OM).  Ejection fraction was preserved.  Last seen by Dr. Excell Seltzerooper 10/18.    [name] returns for follow up.  He is seen with the help of an interpreter.  He has not had any chest pain, shortness of breath, syncope, paroxysmal nocturnal dyspnea, edema.  He does complain of gum bleeding with brushing his teeth as well as difficulty with erections.  It is not clear if he sees anyone for primary care.  He showed me 2 names of providers that he has seen.  There is one physician that comes to [place] from [facility] on [place] to see [place] in [place] for free.  He is requesting medication for ED and a referral to a dentist.  His Medicaid card [facility].  He also needs a note to return to work.    Prior CV studies:   The following studies were reviewed today:  Intraoperative TEE 09/15/16 Moderate concentric LVH, EF 60-65, normal wall motion, trace TR  Echocardiogram 09/14/16 Mild LVH, EF 60-65, normal wall motion, grade 1 diastolic dysfunction, normal RV SF, PASP 36 (borderline pulmonary hypertension)  Cardiac catheterization 09/12/16 Three-vessel CAD >> referred for CABG   Pre-CABG Dopplers 09/13/16 Bilateral ICA 1-39  Past Medical History:  Diagnosis Date  . CAD (coronary artery disease) 03/16/2017   S/p NSTEMI 6/18 >> LHC: multivessel CAD >> s/p CABG // Echo 6/18: Mild LVH, EF 60-65, normal  wall motion, grade 1 diastolic dysfunction, normal RV SF, PASP 36 (borderline pulmonary hypertension) // Pre-CABG Dopplers 09/13/16: Bilateral ICA 1-39  . Family history of anesthesia complication    "never had anesthesia" (07/14/2012)  . History of non-ST elevation myocardial infarction (NSTEMI) 09/12/2016   Treated with CABG  . History of rectal bleeding    secondary to diverticulum affecting right hepatic flexure - 06/2012  . Hypertension   . Type II diabetes mellitus (HCC)     Past Surgical History:  Procedure Laterality Date  . COLONOSCOPY Left 07/14/2012   Procedure: COLONOSCOPY;  Surgeon: Willis ModenaWilliam Outlaw, MD;  [facility];  Service: Endoscopy;  Laterality: Left;  . CORONARY ARTERY BYPASS GRAFT N/A 09/15/2016   Procedure: CORONARY ARTERY BYPASS GRAFTING (CABG) x 3, LIMA to LAD, SVG to DIAGONAL, SVG to OM2, USING LEFT MAMMARY ARTERY AND RIGHT GREATER SAPHENOUS VEIN HARVESTED ENDOSCOPICALLY;  Surgeon: Delight OvensGerhardt, Edward B, MD;  [facility];  Service: Open Heart Surgery;  Laterality: N/A;  . ESOPHAGOGASTRODUODENOSCOPY N/A 07/13/2012   Procedure: ESOPHAGOGASTRODUODENOSCOPY (EGD);  Surgeon: Willis ModenaWilliam Outlaw, MD;  [facility];  Service: Endoscopy;  Laterality: N/A;  pt in ED A8  . LEFT HEART CATH AND CORONARY ANGIOGRAPHY N/A 09/12/2016   Procedure: Left Heart Cath and Coronary Angiography;  Surgeon: Carl Bollmanooper, Michael, MD;  [facility];  Service: Cardiovascular;  Laterality: N/A;  . NO PAST SURGERIES    . TEE WITHOUT CARDIOVERSION N/A  09/15/2016   Procedure: TRANSESOPHAGEAL ECHOCARDIOGRAM (TEE);  Surgeon: Delight OvensGerhardt, Edward B, MD;  [facility];  Service: Open Heart Surgery;  Laterality: N/A;    Current Medications: Current Meds  Medication Sig  . acetaminophen (TYLENOL) 325 MG tablet Take 2 tablets (650 mg total) by mouth every 6 (six) hours as needed for mild pain.  Marland Kitchen. aspirin EC 81 MG tablet Take 81 mg by mouth daily.     Allergies:   No known allergies     Social History   Tobacco Use  . Smoking status: Never Smoker  . Smokeless tobacco: Never Used  Substance Use Topics  . Alcohol use: No  . Drug use: No     Family Hx: The patient's family history includes Diabetes in his father and mother.  ROS:   Please see the history of present illness.    Review of Systems  HENT:       Gum bleeding  Genitourinary:       Difficulty with erections   All other systems reviewed and are negative.   EKGs/Labs/Other Test Reviewed:    EKG:  EKG is  ordered today.  The ekg ordered today demonstrates Normal sinus rhythm, HR 65, normal axis, QTC 393 ms  Recent Labs: 09/12/2016: TSH 1.064 09/14/2016: ALT 16 09/16/2016: Magnesium 1.8 09/19/2016: Hemoglobin 8.9; Platelets 237 10/10/2016: BUN 13; Creatinine, Ser 0.85; Potassium 5.1; Sodium 137   Recent Lipid Panel Lab Results  Component Value Date/Time   CHOL 192 09/12/2016 08:06 AM   TRIG 147 09/12/2016 08:06 AM   HDL 40 (L) 09/12/2016 08:06 AM   CHOLHDL 4.8 09/12/2016 08:06 AM   LDLCALC 123 (H) 09/12/2016 08:06 AM    Physical Exam:    VS:  BP 130/64   Pulse 65   Ht 5\' 5"  (1.651 m)   Wt 143 lb (64.9 kg)   SpO2 97%   BMI 23.80 kg/m     Wt Readings from Last 3 Encounters:  03/17/17 143 lb (64.9 kg)  12/19/16 143 lb (64.9 kg)  10/17/16 130 lb 9.6 oz (59.2 kg)     Physical Exam  Constitutional: He is oriented to person, place, and time. He appears well-developed and well-nourished. No distress.  HENT:  Head: Normocephalic and atraumatic.  Eyes: No scleral icterus.  Neck: No JVD present.  Cardiovascular: Normal rate and regular rhythm.  No murmur heard. Pulmonary/Chest: Effort normal. He has no rales.  Abdominal: Soft.  Musculoskeletal: He exhibits no edema.  Neurological: He is alert and oriented to person, place, and time.  Skin: Skin is warm and dry.    ASSESSMENT:    1. Coronary artery disease involving native coronary artery of native heart without angina pectoris   2.  Type 2 diabetes mellitus with complication, unspecified whether long term insulin use (HCC)   3. Hyperlipidemia, unspecified hyperlipidemia type   4. Essential hypertension   5. Erectile dysfunction, unspecified erectile dysfunction type   6. Gingivitis    PLAN:    In order of problems listed above:  1. Coronary artery disease Status post non-STEMI in 6/18 followed by multivessel CABG.  Overall, he is doing well without symptoms of angina.  Continue current regimen which includes aspirin, statin, beta-blocker.  2. Type 2 diabetes mellitus He needs refills on medications.  It does not appear that he follows with anyone for his diabetes.  I will try to get him an appointment with [facility] for follow-up.  I will refill  his medications today.  3. Hyperlipidemia Continue high-dose statin.  Obtain a comprehensive metabolic panel and fasting lipid panel today.  4. Essential hypertension Blood pressure is controlled.  Continue current medications.  5. Erectile dysfunction This is likely related to vascular disease, diabetes and medications for his coronary artery disease.  I have asked him to follow-up with primary care.  From a cardiovascular standpoint, there is no contraindication to taking PDE-5 inhibitors.  6. Gingivitis He needs dental care.  This can be arranged through primary care.   Dispo:  Return in about 6 months (around 09/15/2017) for Routine Follow Up, w/ Dr. Excell Seltzer.   Medication Adjustments/Labs and Tests Ordered: Current medicines are reviewed at length with the patient today.  Concerns regarding medicines are outlined above.  Tests Ordered: Orders Placed This Encounter  Procedures  . Comprehensive metabolic panel  . Lipid panel  . EKG 12-Lead   Medication Changes: Meds ordered this encounter  Medications  . atorvastatin (LIPITOR) 80 MG tablet    Sig: Take 1 tablet (80 mg total) by mouth daily at 6 PM.    Dispense:  90  tablet    Refill:  3  . glipiZIDE (GLUCOTROL) 10 MG tablet    Sig: Take 1 tablet (10 mg total) by mouth 2 (two) times daily before a meal.    Dispense:  180 tablet    Refill:  0    FURTHER REFILLS TO BE DONE WITH PRIMARY CARE  . losartan (COZAAR) 25 MG tablet    Sig: Take 1 tablet (25 mg total) by mouth daily.    Dispense:  90 tablet    Refill:  3  . metFORMIN (GLUCOPHAGE) 1000 MG tablet    Sig: Take 1 tablet (1,000 mg total) by mouth 2 (two) times daily with a meal.    Dispense:  180 tablet    Refill:  0    FURTHER REFILLS TO BE DONE WITH PRIMARY CARE  . metoprolol tartrate (LOPRESSOR) 25 MG tablet    Sig: Take 1 tablet (25 mg total) by mouth 2 (two) times daily.    Dispense:  180 tablet    Refill:  3    Signed, [name], PA-C  03/17/2017 11:51 AM    [facility] [address] [place], [place], [place]  [address] Phone: [phone]; Fax: [phone]

## 2017-03-17 ENCOUNTER — Telehealth: Payer: Self-pay | Admitting: *Deleted

## 2017-03-17 ENCOUNTER — Encounter: Payer: Self-pay | Admitting: Physician Assistant

## 2017-03-17 ENCOUNTER — Ambulatory Visit (INDEPENDENT_AMBULATORY_CARE_PROVIDER_SITE_OTHER): Payer: BLUE CROSS/BLUE SHIELD | Admitting: Physician Assistant

## 2017-03-17 VITALS — BP 130/64 | HR 65 | Ht 65.0 in | Wt 143.0 lb

## 2017-03-17 DIAGNOSIS — I2510 Atherosclerotic heart disease of native coronary artery without angina pectoris: Secondary | ICD-10-CM | POA: Diagnosis not present

## 2017-03-17 DIAGNOSIS — K0510 Chronic gingivitis, plaque induced: Secondary | ICD-10-CM | POA: Diagnosis not present

## 2017-03-17 DIAGNOSIS — I10 Essential (primary) hypertension: Secondary | ICD-10-CM | POA: Diagnosis not present

## 2017-03-17 DIAGNOSIS — E118 Type 2 diabetes mellitus with unspecified complications: Secondary | ICD-10-CM

## 2017-03-17 DIAGNOSIS — N529 Male erectile dysfunction, unspecified: Secondary | ICD-10-CM

## 2017-03-17 DIAGNOSIS — E785 Hyperlipidemia, unspecified: Secondary | ICD-10-CM

## 2017-03-17 LAB — COMPREHENSIVE METABOLIC PANEL
ALBUMIN: 3.9 g/dL (ref 3.6–4.8)
ALK PHOS: 121 IU/L — AB (ref 39–117)
ALT: 23 IU/L (ref 0–44)
AST: 22 IU/L (ref 0–40)
Albumin/Globulin Ratio: 1.1 — ABNORMAL LOW (ref 1.2–2.2)
BUN / CREAT RATIO: 20 (ref 10–24)
BUN: 20 mg/dL (ref 8–27)
Bilirubin Total: 0.4 mg/dL (ref 0.0–1.2)
CO2: 18 mmol/L — AB (ref 20–29)
CREATININE: 1.01 mg/dL (ref 0.76–1.27)
Calcium: 9.1 mg/dL (ref 8.6–10.2)
Chloride: 101 mmol/L (ref 96–106)
GFR calc Af Amer: 90 mL/min/{1.73_m2} (ref >59)
GFR calc non Af Amer: 78 mL/min/{1.73_m2} (ref >59)
GLOBULIN, TOTAL: 3.6 g/dL (ref 1.5–4.5)
Glucose: 333 mg/dL — ABNORMAL HIGH (ref 65–99)
Potassium: 4.7 mmol/L (ref 3.5–5.2)
SODIUM: 134 mmol/L (ref 134–144)
Total Protein: 7.5 g/dL (ref 6.0–8.5)

## 2017-03-17 LAB — LIPID PANEL
CHOL/HDL RATIO: 2.9 ratio (ref 0.0–5.0)
CHOLESTEROL TOTAL: 87 mg/dL — AB (ref 100–199)
HDL: 30 mg/dL — ABNORMAL LOW (ref >39)
LDL CALC: 49 mg/dL (ref 0–99)
Triglycerides: 39 mg/dL (ref 0–149)
VLDL Cholesterol Cal: 8 mg/dL (ref 5–40)

## 2017-03-17 MED ORDER — LOSARTAN POTASSIUM 25 MG PO TABS: 25.0000 mg | ORAL_TABLET | Freq: Every day | ORAL | 3 refills | Status: DC

## 2017-03-17 MED ORDER — GLIPIZIDE 10 MG PO TABS: 10.0000 mg | ORAL_TABLET | Freq: Two times a day (BID) | ORAL | 0 refills | Status: DC

## 2017-03-17 MED ORDER — METFORMIN HCL 1000 MG PO TABS: 1000.0000 mg | ORAL_TABLET | Freq: Two times a day (BID) | ORAL | 0 refills | Status: DC

## 2017-03-17 MED ORDER — METOPROLOL TARTRATE 25 MG PO TABS: 25.0000 mg | ORAL_TABLET | Freq: Two times a day (BID) | ORAL | 3 refills | Status: DC

## 2017-03-17 MED ORDER — ATORVASTATIN CALCIUM 80 MG PO TABS: 80.0000 mg | ORAL_TABLET | Freq: Every day | ORAL | 3 refills | Status: DC

## 2017-03-17 NOTE — Telephone Encounter (Signed)
-----   Message from Beatrice LecherScott T Weaver, [place]-C sent at 03/17/2017  4:40 PM EST ----- Please call the patient LDL ok.  Renal function, LFTs normal. Sugar too high. Make sure he knows to [facility] next week to get established with a PCP. Tereso NewcomerScott Weaver, PA-C    03/17/2017 4:39 PM

## 2017-03-17 NOTE — Telephone Encounter (Signed)
Left message to go over lab results.  

## 2017-03-17 NOTE — Patient Instructions (Signed)
Medication Instructions:  1. REFILLS HAVE BEEN SENT IN TO [facility] ON [address]   2. WE HAVE FILLED YOUR DIABETIC MEDICATIONS TODAY, HOWEVER THIS IS A ONE TIME REFILL AND FURTHER TO BE DONE WITH PRIMARY CARE PHYSICIAN  Labwork: TODAY LIPIDS AND CMET  Testing/Procedures: NONE ORDERED TODAY  Follow-Up: 1. DR. Excell SeltzerOOPER MAY 2019, WE WILL SEND OUT A REMINDER LETTER TO HAVE YOU CALL AND MAKE AN APPOINTMENT   2. YOU WILL NEED TO CALL [phone] TO [facility] NEXT WED ON 03/23/17 TO MAKE AN A APPOINTMENT    Any Other Special Instructions Will Be Listed Below (If Applicable).     If you need a refill on your cardiac medications before your next appointment, please call your pharmacy.

## 2017-03-23 NOTE — Telephone Encounter (Signed)
Lmtcb x 2 to go over lab results. [facility] to make sure he gets established with a PCP.

## 2017-03-23 NOTE — Telephone Encounter (Signed)
Follow up    Patient returned call through answering service to set up appt ?

## 2017-03-24 NOTE — Telephone Encounter (Signed)
Lmtcb to go over lab results 

## 2017-03-27 ENCOUNTER — Encounter: Payer: Self-pay | Admitting: *Deleted

## 2017-03-27 NOTE — Telephone Encounter (Signed)
Left message for pt through [employer]pre[employer] : Interpreter [phone] lmtcb .   I will mail out a letter to the pt today to call for results.

## 2017-03-27 NOTE — Telephone Encounter (Signed)
-----   Message from Scott T Weaver, PA-C sent at 03/17/2017  4:40 PM EST ----- Please call the patient LDL ok.  Renal function, LFTs normal. Sugar too high. Make sure he knows to contact [facility] next week to get established with a PCP. Scott Weaver, PA-C    03/17/2017 4:39 PM 

## 2017-04-04 ENCOUNTER — Emergency Department (HOSPITAL_COMMUNITY)
Admission: EM | Admit: 2017-04-04 | Discharge: 2017-04-04 | Disposition: A | Discharge status: Home / Self Care | Payer: BLUE CROSS/BLUE SHIELD | Attending: Emergency Medicine | Admitting: Emergency Medicine

## 2017-04-04 ENCOUNTER — Encounter (HOSPITAL_COMMUNITY): Payer: Self-pay | Admitting: Emergency Medicine

## 2017-04-04 ENCOUNTER — Emergency Department (HOSPITAL_COMMUNITY): Payer: BLUE CROSS/BLUE SHIELD

## 2017-04-04 DIAGNOSIS — Z7982 Long term (current) use of aspirin: Secondary | ICD-10-CM | POA: Diagnosis not present

## 2017-04-04 DIAGNOSIS — R109 Unspecified abdominal pain: Secondary | ICD-10-CM | POA: Diagnosis not present

## 2017-04-04 DIAGNOSIS — Z7984 Long term (current) use of oral hypoglycemic drugs: Secondary | ICD-10-CM | POA: Diagnosis not present

## 2017-04-04 DIAGNOSIS — I252 Old myocardial infarction: Secondary | ICD-10-CM | POA: Insufficient documentation

## 2017-04-04 DIAGNOSIS — I2510 Atherosclerotic heart disease of native coronary artery without angina pectoris: Secondary | ICD-10-CM | POA: Diagnosis not present

## 2017-04-04 DIAGNOSIS — E119 Type 2 diabetes mellitus without complications: Secondary | ICD-10-CM | POA: Insufficient documentation

## 2017-04-04 DIAGNOSIS — Z951 Presence of aortocoronary bypass graft: Secondary | ICD-10-CM | POA: Insufficient documentation

## 2017-04-04 DIAGNOSIS — M545 Low back pain, unspecified: Secondary | ICD-10-CM

## 2017-04-04 DIAGNOSIS — I10 Essential (primary) hypertension: Secondary | ICD-10-CM | POA: Diagnosis not present

## 2017-04-04 LAB — COMPREHENSIVE METABOLIC PANEL
ALBUMIN: 3.3 g/dL — AB (ref 3.5–5.0)
ALT: 14 U/L — ABNORMAL LOW (ref 17–63)
AST: 16 U/L (ref 15–41)
Alkaline Phosphatase: 88 U/L (ref 38–126)
Anion gap: 9 (ref 5–15)
BUN: 15 mg/dL (ref 6–20)
CHLORIDE: 106 mmol/L (ref 101–111)
CO2: 21 mmol/L — ABNORMAL LOW (ref 22–32)
Calcium: 9.2 mg/dL (ref 8.9–10.3)
Creatinine, Ser: 0.97 mg/dL (ref 0.61–1.24)
GFR calc Af Amer: >60 mL/min (ref >60)
GFR calc non Af Amer: >60 mL/min (ref >60)
GLUCOSE: 149 mg/dL — AB (ref 65–99)
POTASSIUM: 3.8 mmol/L (ref 3.5–5.1)
Sodium: 136 mmol/L (ref 135–145)
TOTAL PROTEIN: 7.6 g/dL (ref 6.5–8.1)
Total Bilirubin: 0.5 mg/dL (ref 0.3–1.2)

## 2017-04-04 LAB — CBC
HEMATOCRIT: 36.3 % — AB (ref 39.0–52.0)
Hemoglobin: 12.1 g/dL — ABNORMAL LOW (ref 13.0–17.0)
MCH: 30.6 pg (ref 26.0–34.0)
MCHC: 33.3 g/dL (ref 30.0–36.0)
MCV: 91.7 fL (ref 78.0–100.0)
Platelets: 309 10*3/uL (ref 150–400)
RBC: 3.96 MIL/uL — ABNORMAL LOW (ref 4.22–5.81)
RDW: 12.6 % (ref 11.5–15.5)
WBC: 3.8 10*3/uL — ABNORMAL LOW (ref 4.0–10.5)

## 2017-04-04 LAB — LIPASE, BLOOD: Lipase: 51 U/L (ref 11–51)

## 2017-04-04 LAB — URINALYSIS, ROUTINE W REFLEX MICROSCOPIC
BILIRUBIN URINE: NEGATIVE
GLUCOSE, UA: 50 mg/dL — AB
HGB URINE DIPSTICK: NEGATIVE
KETONES UR: NEGATIVE mg/dL
Leukocytes, UA: NEGATIVE
Nitrite: NEGATIVE
PH: 5 (ref 5.0–8.0)
Protein, ur: NEGATIVE mg/dL
Specific Gravity, Urine: 1.012 (ref 1.005–1.030)

## 2017-04-04 MED ORDER — IOPAMIDOL (ISOVUE-300) INJECTION 61%
INTRAVENOUS | Status: AC
  Administered 2017-04-04: 100 mL
  Filled 2017-04-04: qty 100

## 2017-04-04 MED ORDER — SODIUM CHLORIDE 0.9 % IV BOLUS (SEPSIS)
1000.0000 mL | Freq: Once | INTRAVENOUS | Status: AC
  Administered 2017-04-04: 1000 mL via INTRAVENOUS

## 2017-04-04 MED ORDER — HYDROCODONE-ACETAMINOPHEN 5-325 MG PO TABS: 1.0000 | ORAL_TABLET | Freq: Four times a day (QID) | ORAL | 0 refills | Status: DC | PRN

## 2017-04-04 MED ORDER — METOPROLOL TARTRATE 25 MG PO TABS
25.0000 mg | ORAL_TABLET | Freq: Once | ORAL | Status: AC
  Administered 2017-04-04: 25 mg via ORAL
  Filled 2017-04-04: qty 1

## 2017-04-04 MED ORDER — LOSARTAN POTASSIUM 25 MG PO TABS
25.0000 mg | ORAL_TABLET | Freq: Once | ORAL | Status: AC
  Administered 2017-04-04: 25 mg via ORAL
  Filled 2017-04-04: qty 1

## 2017-04-04 NOTE — ED Provider Notes (Signed)
[facility]ENC[facility] Provider Note   CSN: [id number] Arrival date & time: 04/04/17  0405     History   Chief Complaint Chief Complaint  Patient presents with  . Abdominal Pain    HPI Carl Clark is a 67 y.o. male.  Patient is a 67 year old male with a history of coronary artery disease status post CABG x3 on 03/16/2017, diabetes, hypertension who is presenting today with bilateral flank, stomach and back pain that started yesterday.  Patient cannot think of anything he was doing specifically when the pain started but it has continued throughout the night.  He states it is a 9 to a 10 out of 10 but it is not causing any nausea or vomiting and pain comes and goes.  Does seem to be worse with certain movements.  He has had some change in his bowels with 1 episode of diarrhea yesterday and today.  He denies fever, cough, chest pain or shortness of breath.  He has been able to urinate fine.  He has not changed any of his medications and continues to take what he is prescribed.  He states he has never had pain like this before.  He has had no prior abdominal surgeries.  Patient did have an endoscopy and colonoscopy in 2014 which were relatively normal other than some mild gastritis and a few small polyps in his rectum.  Patient denies any blood in his stool.  No numbness or weakness in the legs and no bowel or bladder incontinence or retention.   The history is provided by the patient. The history is limited by a language barrier. A language interpreter was used.    Past Medical History:  Diagnosis Date  . CAD (coronary artery disease) 03/16/2017   S/p NSTEMI 6/18 >> LHC: multivessel CAD >> s/p CABG // Echo 6/18: Mild LVH, EF 60-65, normal wall motion, grade 1 diastolic dysfunction, normal RV SF, PASP 36 (borderline pulmonary hypertension) // Pre-CABG Dopplers 09/13/16: Bilateral ICA 1-39  . Family history of anesthesia complication    "never had  anesthesia" (07/14/2012)  . History of non-ST elevation myocardial infarction (NSTEMI) 09/12/2016   Treated with CABG  . History of rectal bleeding    secondary to diverticulum affecting right hepatic flexure - 06/2012  . Hypertension   . Type II diabetes mellitus [facility](HCC)     Patient Active Problem List   Diagnosis Date Noted  . CAD (coronary artery disease) 03/16/2017  . Hyperlipidemia 03/16/2017  . 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 History of non-ST elevation myocardial infarction (NSTEMI) 09/12/2016  . Rectal bleeding 07/13/2012  . Diabetes mellitus (HCC) 07/13/2012  . Essential hypertension 07/13/2012    Past Surgical History:  Procedure Laterality Date  . COLONOSCOPY Left 07/14/2012   Procedure: COLONOSCOPY;  Surgeon: Willis ModenaWilliam Outlaw, MD;  [facility];  Service: Endoscopy;  Laterality: Left;  . CORONARY ARTERY BYPASS GRAFT N/A 09/15/2016   Procedure: CORONARY ARTERY BYPASS GRAFTING (CABG) x 3, LIMA to LAD, SVG to DIAGONAL, SVG to OM2, USING LEFT MAMMARY ARTERY AND RIGHT GREATER SAPHENOUS VEIN HARVESTED ENDOSCOPICALLY;  Surgeon: Delight OvensGerhardt, Edward B, MD;  [facility];  Service: Open Heart Surgery;  Laterality: N/A;  . ESOPHAGOGASTRODUODENOSCOPY N/A 07/13/2012   Procedure: ESOPHAGOGASTRODUODENOSCOPY (EGD);  Surgeon: Willis ModenaWilliam Outlaw, MD;  [facility];  Service: Endoscopy;  Laterality: N/A;  pt in ED A8  . LEFT HEART CATH AND CORONARY ANGIOGRAPHY N/A 09/12/2016   Procedure: Left Heart Cath and Coronary Angiography;  Surgeon: Tonny Bollman, MD;  Location: [facility] INVASIVE CV LAB;  Service: Cardiovascular;  Laterality: N/A;  . NO PAST SURGERIES    . TEE WITHOUT CARDIOVERSION N/A 09/15/2016   Procedure: TRANSESOPHAGEAL ECHOCARDIOGRAM (TEE);  Surgeon: Delight Ovens, MD;  Location: [facility] OR;  Service: Open Heart Surgery;  Laterality: N/A;       Home Medications    Prior to Admission medications   Medication Sig Start Date End Date Taking? Authorizing Provider  acetaminophen  (TYLENOL) 325 MG tablet Take 2 tablets (650 mg total) by mouth every 6 (six) hours as needed for mild pain. 09/21/16   Ardelle Balls, PA-C  aspirin EC 81 MG tablet Take 81 mg by mouth daily.    [provider]  atorvastatin (LIPITOR) 80 MG tablet Take 1 tablet (80 mg total) by mouth daily at 6 PM. 03/17/17   Alben Spittle, Lorin Picket T, PA-C  glipiZIDE (GLUCOTROL) 10 MG tablet Take 1 tablet (10 mg total) by mouth 2 (two) times daily before a meal. 03/17/17 03/12/18  Alben Spittle, Lorin Picket T, PA-C  Insulin Detemir (LEVEMIR) 100 UNIT/ML Pen Inject 20 Units into the skin daily at 10 pm. 10/17/16   Barrett, Erin R, PA-C  losartan (COZAAR) 25 MG tablet Take 1 tablet (25 mg total) by mouth daily. 03/17/17 03/12/18  [name] T, PA-C  metFORMIN (GLUCOPHAGE) 1000 MG tablet Take 1 tablet (1,000 mg total) by mouth 2 (two) times daily with a meal. 03/17/17 03/12/18  [name] T, PA-C  metoprolol tartrate (LOPRESSOR) 25 MG tablet Take 1 tablet (25 mg total) by mouth 2 (two) times daily. 03/17/17 03/12/18  Beatrice Lecher, PA-C    Family History Family History  Problem Relation Age of Onset  . Diabetes Mother   . Diabetes Father     Social History Social History   Tobacco Use  . Smoking status: Never Smoker  . Smokeless tobacco: Never Used  Substance Use Topics  . Alcohol use: No  . Drug use: No     Allergies   No known allergies   Review of Systems Review of Systems  All other systems reviewed and are negative.    Physical Exam Updated Vital Signs BP (!) 179/82   Pulse 65   Temp (!) 97.3 F (36.3 C) (Oral)   Resp 17   Ht 5\' 5"  (1.651 m)   Wt 64.4 kg (142 lb)   SpO2 100%   BMI 23.63 kg/m   Physical Exam  Constitutional: He is oriented to person, place, and time. He appears well-developed and well-nourished. No distress.  HENT:  Head: Normocephalic and atraumatic.  Mouth/Throat: Oropharynx is clear and moist.  Eyes: Conjunctivae and EOM are normal. Pupils are equal, round,  and reactive to light.  Neck: Normal range of motion. Neck supple.  Cardiovascular: Normal rate, regular rhythm and intact distal pulses.  No murmur heard. Pulmonary/Chest: Effort normal and breath sounds normal. No respiratory distress. He has no wheezes. He has no rales. He exhibits no tenderness.  Healing sternotomy scar without acute abnormality  Abdominal: Soft. He exhibits no distension. There is no tenderness. There is CVA tenderness. There is no rebound and no guarding.    Musculoskeletal: Normal range of motion. He exhibits no edema or tenderness.  Neurological: He is alert and oriented to person, place, and time.  Skin: Skin is warm and dry. No rash noted. No erythema.  Psychiatric: He has a normal mood and affect. His behavior is normal.  Nursing note and vitals reviewed.  ED Treatments / Results  Labs (all labs ordered are listed, but only abnormal results are displayed) Labs Reviewed  COMPREHENSIVE METABOLIC PANEL - Abnormal; Notable for the following components:      Result Value   CO2 21 (*)    Glucose, Bld 149 (*)    Albumin 3.3 (*)    ALT 14 (*)    All other components within normal limits  CBC - Abnormal; Notable for the following components:   WBC 3.8 (*)    RBC 3.96 (*)    Hemoglobin 12.1 (*)    HCT 36.3 (*)    All other components within normal limits  URINALYSIS, ROUTINE W REFLEX MICROSCOPIC - Abnormal; Notable for the following components:   Glucose, UA 50 (*)    All other components within normal limits  LIPASE, BLOOD    EKG  EKG Interpretation None       Radiology Ct Abdomen Pelvis W Contrast  Result Date: 04/04/2017 CLINICAL DATA:  Bilateral flank pain for 2 weeks. EXAM: CT ABDOMEN AND PELVIS WITH CONTRAST TECHNIQUE: Multidetector CT imaging of the abdomen and pelvis was performed using the standard protocol following bolus administration of intravenous contrast. CONTRAST:  ISOVUE-300 IOPAMIDOL (ISOVUE-300) INJECTION 61% COMPARISON:   Lumbar spine radiographs from 06/20/2014 FINDINGS: Lower chest: Partially included on the top most image there is an indistinctly marginated right middle lobe nodule with included portion measuring 5 mm in diameter. Left anterior descending coronary artery atherosclerotic calcification. Hepatobiliary: Mildly contracted gallbladder. 1.3 by 0.4 cm calcification along the posterior right hepatic lobe subcapsular margin on image 19/3. Otherwise unremarkable. Pancreas: Unremarkable Spleen: Unremarkable Adrenals/Urinary Tract: Unremarkable Stomach/Bowel: Unremarkable.  Appendix normal. Vascular/Lymphatic: Aortoiliac atherosclerotic vascular disease. Reproductive: Unremarkable Other: No supplemental non-categorized findings. Musculoskeletal: Prior median sternotomy. IMPRESSION: 1. No specific cause for the patient's bilateral flank pain is identified. 2. Peripheral calcification posteriorly in the right hepatic lobe technically nonspecific but quite likely postinflammatory or due to old granulomatous disease. 3. We partially include a 5 mm right middle lobe nodule on the top most image. No follow-up needed if patient is low-risk. Non-contrast chest CT can be considered in 12 months if patient is high-risk. This recommendation follows the consensus statement: Guidelines for Management of Incidental Pulmonary Nodules Detected on CT Images: From the Fleischner Society 2017; Radiology 2017; 284:228-243. 4. Aortic Atherosclerosis (ICD10-I70.0). Left anterior descending coronary artery atherosclerosis. Prior CABG. Electronically Signed   By: Gaylyn Rong M.D.   On: 04/04/2017 10:45    Procedures Procedures (including critical care time)  Medications Ordered in ED Medications  sodium chloride 0.9 % bolus 1,000 mL (1,000 mLs Intravenous New Bag/Given 04/04/17 0950)  iopamidol (ISOVUE-300) 61 % injection (100 mLs  Contrast Given 04/04/17 1007)     Initial Impression / Assessment and Plan / ED Course  I have  reviewed the triage vital signs and the nursing notes.  Pertinent labs & imaging results that were available during my care of the patient were reviewed by me and considered in my medical decision making (see chart for details).    Patient presenting today with vague abdominal/back pain that started yesterday.  He denies any trauma or doing anything particular prior to the pain starting.  He describes it as bilateral sides of his abdomen and then in his back.  Nothing seems to make it worse but nothing seems to make it better.  He did not try taking any medication at home.  He denies any urinary symptoms, fever, nausea or vomiting.  On exam patient has no suprapubic or upper abdominal pain.  Pain is mostly when palpating the sides of his abdomen and his flank areas however there is no rebound or guarding.  Patient states the pain is 10 out of 10 but he appears comfortable on exam.  Vital signs show no evidence of fever but he has been hypertensive since arrival.  Unclear if he is taking any of his blood pressure medications today and they were ordered.  Labs are reassuring with a normal hemoglobin, slightly decreased white blood cell count of 3.8 and normal CMP, lipase and UA.  We will do a CT of the abdomen and pelvis to further evaluate.  Patient refusing pain medication at this time.  11:13 AM CT within normal limits at this time.  Nothing to explain patient's back and flank pain.  Patient was concerned it was his kidneys because he stated at one point he went to the doctor and they told him there was a problem with his kidney.  Patient has no evidence of acute kidney injury or renal pathology at this time today.  Patient's pain may be related to musculoskeletal pain in the back.  Low suspicion for epidural hematoma or discitis or infection.  Patient has no infectious symptoms at this time and is otherwise well-appearing.  Final Clinical Impressions(s) / ED Diagnoses   Final diagnoses:  Acute  bilateral low back pain without sciatica    ED Discharge Orders        Ordered    HYDROcodone-acetaminophen (NORCO/VICODIN) 5-325 MG tablet  Every 6 hours PRN     04/04/17 1110       Gwyneth Sprout, MD 04/04/17 1114

## 2017-04-04 NOTE — ED Triage Notes (Signed)
Pt reports bilateral flank/abdominal pain since he saw his PCP two weeks ago.

## 2017-04-04 NOTE — ED Notes (Signed)
EDP at bedside  

## 2017-04-12 ENCOUNTER — Other Ambulatory Visit: Payer: Self-pay

## 2017-04-12 ENCOUNTER — Emergency Department (HOSPITAL_COMMUNITY): Payer: BLUE CROSS/BLUE SHIELD

## 2017-04-12 ENCOUNTER — Encounter (HOSPITAL_COMMUNITY): Payer: Self-pay | Admitting: Emergency Medicine

## 2017-04-12 ENCOUNTER — Emergency Department (HOSPITAL_COMMUNITY)
Admission: EM | Admit: 2017-04-12 | Discharge: 2017-04-12 | Disposition: A | Discharge status: Home / Self Care | Payer: BLUE CROSS/BLUE SHIELD | Attending: Emergency Medicine | Admitting: Emergency Medicine

## 2017-04-12 DIAGNOSIS — Z79899 Other long term (current) drug therapy: Secondary | ICD-10-CM | POA: Diagnosis not present

## 2017-04-12 DIAGNOSIS — R079 Chest pain, unspecified: Secondary | ICD-10-CM | POA: Diagnosis present

## 2017-04-12 DIAGNOSIS — Z951 Presence of aortocoronary bypass graft: Secondary | ICD-10-CM | POA: Insufficient documentation

## 2017-04-12 DIAGNOSIS — E1165 Type 2 diabetes mellitus with hyperglycemia: Secondary | ICD-10-CM | POA: Diagnosis not present

## 2017-04-12 DIAGNOSIS — Z794 Long term (current) use of insulin: Secondary | ICD-10-CM | POA: Diagnosis not present

## 2017-04-12 DIAGNOSIS — I10 Essential (primary) hypertension: Secondary | ICD-10-CM | POA: Diagnosis not present

## 2017-04-12 DIAGNOSIS — I2510 Atherosclerotic heart disease of native coronary artery without angina pectoris: Secondary | ICD-10-CM | POA: Diagnosis not present

## 2017-04-12 DIAGNOSIS — R0789 Other chest pain: Secondary | ICD-10-CM | POA: Insufficient documentation

## 2017-04-12 DIAGNOSIS — R739 Hyperglycemia, unspecified: Secondary | ICD-10-CM

## 2017-04-12 DIAGNOSIS — R109 Unspecified abdominal pain: Secondary | ICD-10-CM | POA: Insufficient documentation

## 2017-04-12 LAB — CBC
HCT: 38.7 % — ABNORMAL LOW (ref 39.0–52.0)
Hemoglobin: 12.8 g/dL — ABNORMAL LOW (ref 13.0–17.0)
MCH: 30.8 pg (ref 26.0–34.0)
MCHC: 33.1 g/dL (ref 30.0–36.0)
MCV: 93 fL (ref 78.0–100.0)
PLATELETS: 363 10*3/uL (ref 150–400)
RBC: 4.16 MIL/uL — ABNORMAL LOW (ref 4.22–5.81)
RDW: 12.8 % (ref 11.5–15.5)
WBC: 5 10*3/uL (ref 4.0–10.5)

## 2017-04-12 LAB — BASIC METABOLIC PANEL
Anion gap: 11 (ref 5–15)
BUN: 19 mg/dL (ref 6–20)
CALCIUM: 9.2 mg/dL (ref 8.9–10.3)
CO2: 23 mmol/L (ref 22–32)
CREATININE: 1.09 mg/dL (ref 0.61–1.24)
Chloride: 101 mmol/L (ref 101–111)
Glucose, Bld: 308 mg/dL — ABNORMAL HIGH (ref 65–99)
Potassium: 4.3 mmol/L (ref 3.5–5.1)
SODIUM: 135 mmol/L (ref 135–145)

## 2017-04-12 LAB — URINALYSIS, ROUTINE W REFLEX MICROSCOPIC
Bacteria, UA: NONE SEEN
Bilirubin Urine: NEGATIVE
Glucose, UA: >=500 mg/dL — AB
HGB URINE DIPSTICK: NEGATIVE
Ketones, ur: NEGATIVE mg/dL
Leukocytes, UA: NEGATIVE
Nitrite: NEGATIVE
PH: 6 (ref 5.0–8.0)
Protein, ur: NEGATIVE mg/dL
SPECIFIC GRAVITY, URINE: 1.015 (ref 1.005–1.030)
Squamous Epithelial / LPF: NONE SEEN

## 2017-04-12 LAB — HEPATIC FUNCTION PANEL
ALT: 22 U/L (ref 17–63)
AST: 25 U/L (ref 15–41)
Albumin: 3.5 g/dL (ref 3.5–5.0)
Alkaline Phosphatase: 73 U/L (ref 38–126)
BILIRUBIN DIRECT: 0.2 mg/dL (ref 0.1–0.5)
BILIRUBIN INDIRECT: 0.7 mg/dL (ref 0.3–0.9)
BILIRUBIN TOTAL: 0.9 mg/dL (ref 0.3–1.2)
Total Protein: 8 g/dL (ref 6.5–8.1)

## 2017-04-12 LAB — I-STAT TROPONIN, ED
TROPONIN I, POC: 0.02 ng/mL (ref 0.00–0.08)
Troponin i, poc: 0.01 ng/mL (ref 0.00–0.08)

## 2017-04-12 LAB — LIPASE, BLOOD: LIPASE: 28 U/L (ref 11–51)

## 2017-04-12 LAB — CBG MONITORING, ED: Glucose-Capillary: 189 mg/dL — ABNORMAL HIGH (ref 65–99)

## 2017-04-12 MED ORDER — IBUPROFEN 600 MG PO TABS: 600.0000 mg | ORAL_TABLET | Freq: Four times a day (QID) | ORAL | 0 refills | Status: DC | PRN

## 2017-04-12 MED ORDER — SODIUM CHLORIDE 0.9 % IV BOLUS (SEPSIS)
1000.0000 mL | Freq: Once | INTRAVENOUS | Status: AC
  Administered 2017-04-12: 1000 mL via INTRAVENOUS

## 2017-04-12 MED ORDER — HYDROCODONE-ACETAMINOPHEN 5-325 MG PO TABS: 1.0000 | ORAL_TABLET | Freq: Four times a day (QID) | ORAL | 0 refills | Status: DC | PRN

## 2017-04-12 MED ORDER — CYCLOBENZAPRINE HCL 5 MG PO TABS: 5.0000 mg | ORAL_TABLET | Freq: Three times a day (TID) | ORAL | 0 refills | Status: DC | PRN

## 2017-04-12 MED ORDER — MORPHINE SULFATE (PF) 4 MG/ML IV SOLN
4.0000 mg | Freq: Once | INTRAVENOUS | Status: AC
  Administered 2017-04-12: 4 mg via INTRAVENOUS
  Filled 2017-04-12: qty 1

## 2017-04-12 NOTE — ED Triage Notes (Signed)
Pt is here with c/o chest pain but is pointing to his left hip and left rib cage area , pt has been ongoing for 10 days

## 2017-04-12 NOTE — ED Notes (Signed)
Pt CBG 189 RN Notified.

## 2017-04-12 NOTE — Discharge Instructions (Signed)
You need to take your diabetes medicines as prescribed.   Take motrin for pain.   Take flexeril for muscle spasms.   Take vicodin for severe pain.   See your cardiologist and primary care doctor.   Recheck your blood sugar with your doctor next week   Return to ER if you have worse chest pain, flank pain, abdominal pain, vomiting, fever.

## 2017-04-12 NOTE — ED Notes (Signed)
Patient back from x-ray 

## 2017-04-12 NOTE — ED Notes (Signed)
Patient up to bathroom

## 2017-04-12 NOTE — ED Notes (Signed)
Patient is resting with call bell in reach  

## 2017-04-12 NOTE — ED Notes (Signed)
Got patient undress on the monitor patient is resting with call bell in reach 

## 2017-04-12 NOTE — ED Provider Notes (Signed)
[facility] Provider Note   CSN: [id number] Arrival date & time: 04/12/17  1136     History   Chief Complaint Chief Complaint  Patient presents with  . Hip Pain  . Chest Pain    HPI Carl Clark is a 67 y.o. male history of CAD status post NSTEMI and CABG June 2018, here presenting with persistent chest pain, back pain, flank pain.  Patient states that he has intermittent back pain and chest pain more on the left side for the last several weeks.  Patient was seen in the ED about a week ago and had a CT abdomen pelvis that was unremarkable and had a normal urinalysis and labs.  Patient states that he was given hydrocodone but ran out of it and pain is persistent.  States that pain is worse when he moves around.  Denies any shortness of breath or vomiting.  Denies any numbness or weakness of the legs.  Denies any pain when he urinates or blood in his urine.  Patient did have CAD and did have CABG last year and has followed up with cardiology and cardiothoracic surgery since then.  He is compliant with his heart medicines.  Denies any recent travel or leg swelling or history of blood clots.  The history is provided by the patient. A language interpreter was used.    Past Medical History:  Diagnosis Date  . CAD (coronary artery disease) 03/16/2017   S/p NSTEMI 6/18 >> LHC: multivessel CAD >> s/p CABG // Echo 6/18: Mild LVH, EF 60-65, normal wall motion, grade 1 diastolic dysfunction, normal RV SF, PASP 36 (borderline pulmonary hypertension) // Pre-CABG Dopplers 09/13/16: Bilateral ICA 1-39  . Family history of anesthesia complication    "never had anesthesia" (07/14/2012)  . History of non-ST elevation myocardial infarction (NSTEMI) 09/12/2016   Treated with CABG  . History of rectal bleeding    secondary to diverticulum affecting right hepatic flexure - 06/2012  . Hypertension   . Type II diabetes mellitus [facility](HCC)     Patient Active  Problem List   Diagnosis Date Noted  . CAD (coronary artery disease) 03/16/2017  . Hyperlipidemia 03/16/2017  . 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 History of non-ST elevation myocardial infarction (NSTEMI) 09/12/2016  . Rectal bleeding 07/13/2012  . Diabetes mellitus (HCC) 07/13/2012  . Essential hypertension 07/13/2012    Past Surgical History:  Procedure Laterality Date  . COLONOSCOPY Left 07/14/2012   Procedure: COLONOSCOPY;  Surgeon: Willis ModenaWilliam Outlaw, MD;  [facility];  Service: Endoscopy;  Laterality: Left;  . CORONARY ARTERY BYPASS GRAFT N/A 09/15/2016   Procedure: CORONARY ARTERY BYPASS GRAFTING (CABG) x 3, LIMA to LAD, SVG to DIAGONAL, SVG to OM2, USING LEFT MAMMARY ARTERY AND RIGHT GREATER SAPHENOUS VEIN HARVESTED ENDOSCOPICALLY;  Surgeon: Delight OvensGerhardt, Edward B, MD;  [facility];  Service: Open Heart Surgery;  Laterality: N/A;  . ESOPHAGOGASTRODUODENOSCOPY N/A 07/13/2012   Procedure: ESOPHAGOGASTRODUODENOSCOPY (EGD);  Surgeon: Willis ModenaWilliam Outlaw, MD;  [facility];  Service: Endoscopy;  Laterality: N/A;  pt in ED A8  . LEFT HEART CATH AND CORONARY ANGIOGRAPHY N/A 09/12/2016   Procedure: Left Heart Cath and Coronary Angiography;  Surgeon: Tonny Bollmanooper, Michael, MD;  [facility];  Service: Cardiovascular;  Laterality: N/A;  . NO PAST SURGERIES    . TEE WITHOUT CARDIOVERSION N/A 09/15/2016   Procedure: TRANSESOPHAGEAL ECHOCARDIOGRAM (TEE);  Surgeon: Delight OvensGerhardt, Edward B, MD;  [facility];  Service: Open Heart Surgery;  Laterality: N/A;       Home Medications    Prior to Admission medications   Medication Sig Start Date End Date Taking? Authorizing Provider  acetaminophen (TYLENOL) 325 MG tablet Take 2 tablets (650 mg total) by mouth every 6 (six) hours as needed for mild pain. 09/21/16  Yes Doree Fudge M, PA-C  atorvastatin (LIPITOR) 80 MG tablet Take 1 tablet (80 mg total) by mouth daily at 6 PM. 03/17/17  Yes Weaver, Lorin Picket T, PA-C  glipiZIDE  (GLUCOTROL) 10 MG tablet Take 1 tablet (10 mg total) by mouth 2 (two) times daily before a meal. 03/17/17 03/12/18 Yes Weaver, Scott T, PA-C  HYDROcodone-acetaminophen (NORCO/VICODIN) 5-325 MG tablet Take 1-2 tablets by mouth every 6 (six) hours as needed for moderate pain. 04/04/17  Yes Gwyneth Sprout, MD  Insulin Detemir (LEVEMIR) 100 UNIT/ML Pen Inject 20 Units into the skin daily at 10 pm. 10/17/16  Yes Barrett, Erin R, PA-C  losartan (COZAAR) 25 MG tablet Take 1 tablet (25 mg total) by mouth daily. 03/17/17 03/12/18 Yes Weaver, Scott T, PA-C  metFORMIN (GLUCOPHAGE) 1000 MG tablet Take 1 tablet (1,000 mg total) by mouth 2 (two) times daily with a meal. 03/17/17 03/12/18 Yes Weaver, Scott T, PA-C  metoprolol tartrate (LOPRESSOR) 25 MG tablet Take 1 tablet (25 mg total) by mouth 2 (two) times daily. 03/17/17 03/12/18 Yes Beatrice Lecher, PA-C    Family History Family History  Problem Relation Age of Onset  . Diabetes Mother   . Diabetes Father     Social History Social History   Tobacco Use  . Smoking status: Never Smoker  . Smokeless tobacco: Never Used  Substance Use Topics  . Alcohol use: No  . Drug use: No     Allergies   Patient has no active allergies.   Review of Systems Review of Systems  Cardiovascular: Positive for chest pain.  All other systems reviewed and are negative.    Physical Exam Updated Vital Signs BP (!) 159/80 (BP Location: Right Arm)   Pulse 64   Temp (!) 97.5 F (36.4 C) (Oral)   Resp 16   SpO2 100%   Physical Exam  Constitutional: He appears well-developed.  HENT:  Head: Normocephalic.  Eyes: Pupils are equal, round, and reactive to light.  Neck: Normal range of motion.  Cardiovascular: Normal rate, regular rhythm and normal pulses.  Pulmonary/Chest: Effort normal and breath sounds normal.  Reproducible L chest wall tenderness, no deformity   Abdominal: Soft. Bowel sounds are normal.  Musculoskeletal: Normal range of motion.        Right lower leg: Normal.       Left lower leg: Normal.  Mild L lower lumbar tenderness, mild L SI joint vs hip tenderness but nl ROM L hip and L leg not shortened or rotated. Mild L CVAT   Neurological: He is alert.  Skin: Skin is warm.  Psychiatric: He has a normal mood and affect.  Nursing note and vitals reviewed.    ED Treatments / Results  Labs (all labs ordered are listed, but only abnormal results are displayed) Labs Reviewed  BASIC METABOLIC PANEL - Abnormal; Notable for the following components:      Result Value   Glucose, Bld 308 (*)    All other components within normal limits  CBC - Abnormal; Notable for the following components:   RBC 4.16 (*)    Hemoglobin 12.8 (*)    HCT 38.7 (*)    All other components within  normal limits  URINALYSIS, ROUTINE W REFLEX MICROSCOPIC - Abnormal; Notable for the following components:   Color, Urine STRAW (*)    Glucose, UA >=500 (*)    All other components within normal limits  CBG MONITORING, ED - Abnormal; Notable for the following components:   Glucose-Capillary 189 (*)    All other components within normal limits  HEPATIC FUNCTION PANEL  LIPASE, BLOOD  I-STAT TROPONIN, ED  I-STAT TROPONIN, ED  I-STAT TROPONIN, ED    EKG  EKG Interpretation  Date/Time:  Wednesday April 12 2017 11:41:58 EST Ventricular Rate:  72 PR Interval:  128 QRS Duration: 86 QT Interval:  354 QTC Calculation: 387 R Axis:   73 Text Interpretation:  Normal sinus rhythm Nonspecific T wave abnormality Abnormal ECG No significant change since last tracing Confirmed by Richardean Canal [phone]) on 04/12/2017 1:10:10 PM       Radiology Dg Chest 2 View  Result Date: 04/12/2017 CLINICAL DATA:  Chest pain EXAM: CHEST  2 VIEW COMPARISON:  10/17/2016 FINDINGS: Postop CABG. Negative for heart failure. Lungs are clear without infiltrate or effusion. No interval change. IMPRESSION: No active cardiopulmonary disease. Electronically Signed   By: Marlan Palau  M.D.   On: 04/12/2017 13:58   Dg Lumbar Spine Complete  Result Date: 04/12/2017 CLINICAL DATA:  Chest/left rib cage pain and left hip pain for 10 days. EXAM: LUMBAR SPINE - COMPLETE 4+ VIEW COMPARISON:  CT abdomen and pelvis 04/04/2017. Lumbar spine radiographs 06/20/2014. FINDINGS: There are 5 non rib-bearing lumbar type vertebrae. No significant listhesis is seen allowing for obliquity on the lateral radiographs. Vertebral body heights are preserved without evidence of fracture. Mild degenerative endplate spurring is present at L2-3 and L3-4. Intervertebral disc space heights are preserved. No pars defects are identified. The soft tissues are unremarkable. IMPRESSION: Minimal lumbar spondylosis without evidence of acute osseous abnormality. Electronically Signed   By: Sebastian Ache M.D.   On: 04/12/2017 14:01   Dg Hip Unilat W Or Wo Pelvis 2-3 Views Left  Result Date: 04/12/2017 CLINICAL DATA:  Left hip pain for 10 days.  No known injury. EXAM: DG HIP (WITH OR WITHOUT PELVIS) 2-3V LEFT COMPARISON:  None. FINDINGS: There is no evidence of hip fracture or dislocation. There is no evidence of arthropathy or other focal bone abnormality. IMPRESSION: Negative exam. Electronically Signed   By: Drusilla Kanner M.D.   On: 04/12/2017 13:58    Procedures Procedures (including critical care time)  Medications Ordered in ED Medications  sodium chloride 0.9 % bolus 1,000 mL (1,000 mLs Intravenous New Bag/Given 04/12/17 1328)  morphine 4 MG/ML injection 4 mg (4 mg Intravenous Given 04/12/17 1328)     Initial Impression / Assessment and Plan / ED Course  I have reviewed the triage vital signs and the nursing notes.  Pertinent labs & imaging results that were available during my care of the patient were reviewed by me and considered in my medical decision making (see chart for details).     Ramy Greth is a 67 y.o. male here with persistent L chest pain, flank pain. CT a week ago was  unremarkable. He has hx of CABG last year but has seen CT surgery and cardiology already for follow up. I think likely MSK pain. Will get trop x 2, labs, CXR, lumbar and hip xrays. Will give pain meds and IVF.   3:16 PM Initial labs showed glucose 300 with nl AG. Given IVF and glucose down to 189. Xray unremarkable.  Pain controlled. UA nl. Likely muscle pain. Will dc home with motrin, flexeril, refill norco. Told him to take his glipizide and metformin as prescribed.    Final Clinical Impressions(s) / ED Diagnoses   Final diagnoses:  None    ED Discharge Orders    None       Charlynne Pander, MD 04/12/17 [phone]

## 2017-04-19 ENCOUNTER — Other Ambulatory Visit: Payer: Self-pay | Admitting: Pharmacist

## 2017-04-19 ENCOUNTER — Telehealth: Payer: Self-pay | Admitting: Internal Medicine

## 2017-04-19 ENCOUNTER — Encounter: Payer: Self-pay | Admitting: Nurse Practitioner

## 2017-04-19 ENCOUNTER — Ambulatory Visit: Payer: BLUE CROSS/BLUE SHIELD | Attending: Nurse Practitioner | Admitting: Nurse Practitioner

## 2017-04-19 VITALS — BP 179/84 | HR 77 | Temp 97.7°F | Ht 65.0 in | Wt 147.0 lb

## 2017-04-19 DIAGNOSIS — Z951 Presence of aortocoronary bypass graft: Secondary | ICD-10-CM | POA: Diagnosis not present

## 2017-04-19 DIAGNOSIS — M545 Low back pain, unspecified: Secondary | ICD-10-CM

## 2017-04-19 DIAGNOSIS — Z794 Long term (current) use of insulin: Secondary | ICD-10-CM | POA: Insufficient documentation

## 2017-04-19 DIAGNOSIS — Z9889 Other specified postprocedural states: Secondary | ICD-10-CM | POA: Diagnosis not present

## 2017-04-19 DIAGNOSIS — Z23 Encounter for immunization: Secondary | ICD-10-CM | POA: Insufficient documentation

## 2017-04-19 DIAGNOSIS — I252 Old myocardial infarction: Secondary | ICD-10-CM | POA: Diagnosis not present

## 2017-04-19 DIAGNOSIS — I272 Pulmonary hypertension, unspecified: Secondary | ICD-10-CM | POA: Diagnosis not present

## 2017-04-19 DIAGNOSIS — Z0000 Encounter for general adult medical examination without abnormal findings: Secondary | ICD-10-CM | POA: Diagnosis not present

## 2017-04-19 DIAGNOSIS — I2510 Atherosclerotic heart disease of native coronary artery without angina pectoris: Secondary | ICD-10-CM | POA: Diagnosis not present

## 2017-04-19 DIAGNOSIS — N529 Male erectile dysfunction, unspecified: Secondary | ICD-10-CM | POA: Insufficient documentation

## 2017-04-19 DIAGNOSIS — E1165 Type 2 diabetes mellitus with hyperglycemia: Secondary | ICD-10-CM | POA: Diagnosis not present

## 2017-04-19 DIAGNOSIS — E118 Type 2 diabetes mellitus with unspecified complications: Secondary | ICD-10-CM | POA: Diagnosis not present

## 2017-04-19 DIAGNOSIS — N521 Erectile dysfunction due to diseases classified elsewhere: Secondary | ICD-10-CM

## 2017-04-19 DIAGNOSIS — I25810 Atherosclerosis of coronary artery bypass graft(s) without angina pectoris: Secondary | ICD-10-CM | POA: Diagnosis not present

## 2017-04-19 DIAGNOSIS — I10 Essential (primary) hypertension: Secondary | ICD-10-CM | POA: Diagnosis not present

## 2017-04-19 DIAGNOSIS — E7849 Other hyperlipidemia: Secondary | ICD-10-CM | POA: Diagnosis not present

## 2017-04-19 DIAGNOSIS — Z955 Presence of coronary angioplasty implant and graft: Secondary | ICD-10-CM | POA: Insufficient documentation

## 2017-04-19 DIAGNOSIS — Z7982 Long term (current) use of aspirin: Secondary | ICD-10-CM | POA: Insufficient documentation

## 2017-04-19 DIAGNOSIS — Z833 Family history of diabetes mellitus: Secondary | ICD-10-CM | POA: Insufficient documentation

## 2017-04-19 DIAGNOSIS — Z79899 Other long term (current) drug therapy: Secondary | ICD-10-CM | POA: Insufficient documentation

## 2017-04-19 DIAGNOSIS — I214 Non-ST elevation (NSTEMI) myocardial infarction: Secondary | ICD-10-CM | POA: Diagnosis not present

## 2017-04-19 LAB — GLUCOSE, POCT (MANUAL RESULT ENTRY): POC GLUCOSE: 161 mg/dL — AB (ref 70–99)

## 2017-04-19 LAB — POCT GLYCOSYLATED HEMOGLOBIN (HGB A1C): HEMOGLOBIN A1C: 11.4

## 2017-04-19 MED ORDER — GLUCOSE BLOOD VI STRP: ORAL_STRIP | 12 refills | Status: DC

## 2017-04-19 MED ORDER — METOPROLOL TARTRATE 25 MG PO TABS
25.0000 mg | ORAL_TABLET | Freq: Two times a day (BID) | ORAL | 3 refills | Status: DC
Start: 2017-04-19 — End: 2017-06-12

## 2017-04-19 MED ORDER — TRUE METRIX METER W/DEVICE KIT: PACK | 0 refills | Status: DC

## 2017-04-19 MED ORDER — METFORMIN HCL 1000 MG PO TABS: 1000.0000 mg | ORAL_TABLET | Freq: Two times a day (BID) | ORAL | 2 refills | Status: DC

## 2017-04-19 MED ORDER — PNEUMOCOCCAL 13-VAL CONJ VACC IM SUSP: INTRAMUSCULAR | 0 refills | Status: DC

## 2017-04-19 MED ORDER — LOSARTAN POTASSIUM 25 MG PO TABS: 25.0000 mg | ORAL_TABLET | Freq: Every day | ORAL | 3 refills | Status: DC

## 2017-04-19 MED ORDER — DICLOFENAC SODIUM 1 % TD GEL: 2.0000 g | Freq: Four times a day (QID) | TRANSDERMAL | 1 refills | Status: DC

## 2017-04-19 MED ORDER — TRUEPLUS LANCETS 28G MISC: 3 refills | Status: DC

## 2017-04-19 MED ORDER — INSULIN DETEMIR 100 UNIT/ML FLEXPEN: 20.0000 [IU] | PEN_INJECTOR | Freq: Every day | SUBCUTANEOUS | 6 refills | Status: DC

## 2017-04-19 MED ORDER — ASPIRIN 81 MG PO TABS: 81.0000 mg | ORAL_TABLET | Freq: Every day | ORAL | 12 refills | Status: DC

## 2017-04-19 MED ORDER — GLIPIZIDE 10 MG PO TABS: 10.0000 mg | ORAL_TABLET | Freq: Two times a day (BID) | ORAL | 3 refills | Status: DC

## 2017-04-19 MED ORDER — ATORVASTATIN CALCIUM 80 MG PO TABS: 80.0000 mg | ORAL_TABLET | Freq: Every day | ORAL | 1 refills | Status: DC

## 2017-04-19 MED ORDER — CYCLOBENZAPRINE HCL 10 MG PO TABS: 10.0000 mg | ORAL_TABLET | Freq: Two times a day (BID) | ORAL | 0 refills | Status: DC | PRN

## 2017-04-19 MED FILL — PREVNAR 13 SYRINGE: 1 days supply | Qty: 1 | Fill #0

## 2017-04-19 NOTE — Progress Notes (Signed)
Assessment & Plan:  Carl Clark was seen today for new patient (initial visit).  Diagnoses and all orders for this visit:  Type 2 diabetes mellitus with complication, unspecified whether long term insulin use (HCC) -     Glucose (CBG) -     HgB A1c -     glipiZIDE (GLUCOTROL) 10 MG tablet; Take 1 tablet (10 mg total) by mouth 2 (two) times daily before a meal. -     Insulin Detemir (LEVEMIR) 100 UNIT/ML Pen; Inject 20 Units into the skin daily at 10 pm. -     metFORMIN (GLUCOPHAGE) 1000 MG tablet; Take 1 tablet (1,000 mg total) by mouth 2 (two) times daily with a meal. -     Blood Glucose Monitoring Suppl (TRUE METRIX METER) w/Device KIT; Use as instructed -     TRUEPLUS LANCETS 28G MISC; Check sugar 3 times a week fasting before breakfast -     glucose blood (TRUE METRIX BLOOD GLUCOSE TEST) test strip; Check sugar 3 times a week fasting before breakfast -     Referral to Nutrition and Diabetes Services -     Ambulatory referral to Ophthalmology Diabetes is poorly controlled. Advised patient to keep a fasting blood sugar log fast, 2 hours post lunch and bedtime which will be reviewed at the next office visit.  Essential hypertension -     losartan (COZAAR) 25 MG tablet; Take 1 tablet (25 mg total) by mouth daily. -     metoprolol tartrate (LOPRESSOR) 25 MG tablet; Take 1 tablet (25 mg total) by mouth 2 (two) times daily.  Continue all antihypertensives as prescribed.  Remember to bring in your blood pressure log with you for your follow up appointment.  DASH/Mediterranean Diets are healthier choices for HTN.   Coronary artery disease involving native coronary artery of native heart without angina pectoris -     metoprolol tartrate (LOPRESSOR) 25 MG tablet; Take 1 tablet (25 mg total) by mouth 2 (two) times daily. -     aspirin 81 MG tablet; Take 1 tablet (81 mg total) by mouth daily.  History of non-ST elevation myocardial infarction (NSTEMI) Stable. Follow up with cardiology  Other  hyperlipidemia -     atorvastatin (LIPITOR) 80 MG tablet; Take 1 tablet (80 mg total) by mouth daily at 6 PM. Work on a low fat, heart healthy diet and participate in regular exercise program to control as well by walking at least150 minutes per week. No fried foods. No junk foods, sodas, sugary drinks, unhealthy snacking, or smoking.    Acute left-sided low back pain without sciatica -     PSA -     diclofenac sodium (VOLTAREN) 1 % GEL; Apply 2 g topically 4 (four) times daily. -     cyclobenzaprine (FLEXERIL) 10 MG tablet; Take 1 tablet (10 mg total) by mouth 2 (two) times daily as needed for muscle spasms.  Routine adult health maintenance -     Hepatitis C Antibody  Erectile dysfunction due to diseases classified elsewhere -     PSA -     TSH  Immunization due -     Flu Vaccine QUAD 6+ mos PF IM (Fluarix Quad PF)      Patient has been counseled on age-appropriate routine health concerns for screening and prevention. These are reviewed and up-to-date. Referrals have been placed accordingly. Immunizations are up-to-date or declined.    Subjective:   Chief Complaint  Patient presents with  . New Patient (Initial Visit)  Patient is here as a new patient. Patient stated he have left side and left back side stabbing pain for 2 weeks. Patient stated he need medication refills. Patient stated he is fasting and ran out of BP medications 2 days ago.    HPI  VRI was used to communicate directly with patient for the entire encounter including providing detailed patient instructions.   Carl Clark 67 y.o. male presents to office today to establish care. He ran out of all of his medications 2 days ago. He has a history of DM, CAD/NSTEMI/HPL. His chief complaints today are Erectile dysfunction and back pain.   Essential Hypertension He ran out of his blood pressure medication a few days ago. He is not checking his blood pressure daily. Denies chest pain, shortness of  breath, palpitations, lightheadedness, dizziness, headaches or BLE edema. He does endorse medication compliance.  BP Readings from Last 3 Encounters:  04/19/17 (!) 179/84  04/12/17 (!) 144/80  04/04/17 (!) 145/97    NSTEMI  Onset 08-2016 with CABG x3. Next appointment with cardiology in 5 months. S/P CABG. Denies chest pain, shortness of breath, palpitations, lightheadedness, dizziness, headaches or BLE edema.   Back Pain Patient presents for presents evaluation of left sided low back pain radiating to his left knee. Pain occurs every day. Symptoms have been present for 3 weeks and include fevers at night, pain starts in the left knee and radiates to his left flank.  Symptoms are worst: morning, mid-day, afternoon, evening, nighttime. Alleviating factors identifiable by patient are temporary relief with prescription flexeril;oxycodone however pain returns after a few hours. Aggravating factors: Sitting, lying, standing for prolonged periods of time. He has not tried any heat application or back brace.  Treatments so far initiated by patient: none Previous lower back problems: seen in ED for back pain on 04-04-2017. Previous workup:CT did not show any acute findings.  Xrays of hip and lumbar spine. He denies any abdominal pain, melena, hematochezia, constipation or diarrhea.  UA 04-12-2017 on  neg x 2  Xray on 04-12-2017 IMPRESSION: Minimal lumbar spondylosis without evidence of acute osseous Abnormality. Denies any recent injury or heavy lifting.   Erectile Dysfunction Onset 6 months after CABG. Problems obtaining and maintaining an erection. No morning erections. Unable to obtain an erection with self stimulaton or masturbation. Libido is still present. Gets up 3-4 times at night to urinate. Denies hematuria.  DM Type 2 Poorly controlled. He did run out of his medications however his a1c has been consistently high. He endorses medication compliance. There appears to be some lack of knowledge  regarding dietary intake. I will refer him to Diabetes Education and Nutrition. He is requesting a new meter today. He has not been checking his blood sugars. He denies hypo or hyperglycemic symptoms.  Lab Results  Component Value Date   HGBA1C 11.4 04/19/2017   Review of Systems  Constitutional: Negative for fever, malaise/fatigue and weight loss.  HENT: Negative.  Negative for nosebleeds.   Eyes: Negative.  Negative for blurred vision, double vision and photophobia.  Respiratory: Negative.  Negative for cough and shortness of breath.   Cardiovascular: Negative.  Negative for chest pain, palpitations and leg swelling.  Gastrointestinal: Negative.  Negative for abdominal pain, blood in stool, constipation, diarrhea, heartburn, melena, nausea and vomiting.  Genitourinary:       SEE HPI  Musculoskeletal: Positive for back pain and joint pain. Negative for myalgias.  Neurological: Negative.  Negative for dizziness, focal weakness, seizures and headaches.  Endo/Heme/Allergies: Negative for environmental allergies.  Psychiatric/Behavioral: Negative.  Negative for suicidal ideas.    Past Medical History:  Diagnosis Date  . CAD (coronary artery disease) 03/16/2017   S/p NSTEMI 6/18 >> LHC: multivessel CAD >> s/p CABG // Echo 6/18: Mild LVH, EF 60-65, normal wall motion, grade 1 diastolic dysfunction, normal RV SF, PASP 36 (borderline pulmonary hypertension) // Pre-CABG Dopplers 09/13/16: Bilateral ICA 1-39  . Family history of anesthesia complication    "never had anesthesia" (07/14/2012)  . History of non-ST elevation myocardial infarction (NSTEMI) 09/12/2016   Treated with CABG  . History of rectal bleeding    secondary to diverticulum affecting right hepatic flexure - 06/2012  . Hypertension   . Type II diabetes mellitus ([place])     Past Surgical History:  Procedure Laterality Date  . COLONOSCOPY Left 07/14/2012   Procedure: COLONOSCOPY;  Surgeon: Arta Silence, MD;  Location: [facility]  ENDOSCOPY;  Service: Endoscopy;  Laterality: Left;  . CORONARY ARTERY BYPASS GRAFT N/A 09/15/2016   Procedure: CORONARY ARTERY BYPASS GRAFTING (CABG) x 3, LIMA to LAD, SVG to DIAGONAL, SVG to OM2, USING LEFT MAMMARY ARTERY AND RIGHT GREATER SAPHENOUS VEIN HARVESTED ENDOSCOPICALLY;  Surgeon: Grace Isaac, MD;  Location: [facility];  Service: Open Heart Surgery;  Laterality: N/A;  . ESOPHAGOGASTRODUODENOSCOPY N/A 07/13/2012   Procedure: ESOPHAGOGASTRODUODENOSCOPY (EGD);  Surgeon: Arta Silence, MD;  Location: [facility] ENDOSCOPY;  Service: Endoscopy;  Laterality: N/A;  pt in ED A8  . LEFT HEART CATH AND CORONARY ANGIOGRAPHY N/A 09/12/2016   Procedure: Left Heart Cath and Coronary Angiography;  Surgeon: Sherren Mocha, MD;  Location: [place] [facility];  Service: Cardiovascular;  Laterality: N/A;  . NO PAST SURGERIES    . TEE WITHOUT CARDIOVERSION N/A 09/15/2016   Procedure: TRANSESOPHAGEAL ECHOCARDIOGRAM (TEE);  Surgeon: Grace Isaac, MD;  Location: [place];  Service: Open Heart Surgery;  Laterality: N/A;    Family History  Problem Relation Age of Onset  . Diabetes Mother   . Diabetes Father     Social History Reviewed with no changes to be made today.   Outpatient Medications Prior to Visit  Medication Sig Dispense Refill  . atorvastatin (LIPITOR) 80 MG tablet Take 1 tablet (80 mg total) by mouth daily at 6 PM. 90 tablet 3  . glipiZIDE (GLUCOTROL) 10 MG tablet Take 1 tablet (10 mg total) by mouth 2 (two) times daily before a meal. 180 tablet 0  . Insulin Detemir (LEVEMIR) 100 UNIT/ML Pen Inject 20 Units into the skin daily at 10 pm. 15 mL 3  . metFORMIN (GLUCOPHAGE) 1000 MG tablet Take 1 tablet (1,000 mg total) by mouth 2 (two) times daily with a meal. 180 tablet 0  . acetaminophen (TYLENOL) 325 MG tablet Take 2 tablets (650 mg total) by mouth every 6 (six) hours as needed for mild pain. (Patient not taking: Reported on 04/19/2017)    . aspirin 81 MG tablet Take 81 mg by mouth daily.    .  cyclobenzaprine (FLEXERIL) 5 MG tablet Take 1 tablet (5 mg total) by mouth 3 (three) times daily as needed. (Patient not taking: Reported on 04/19/2017) 10 tablet 0  . HYDROcodone-acetaminophen (NORCO/VICODIN) 5-325 MG tablet Take 1 tablet by mouth every 6 (six) hours as needed. (Patient not taking: Reported on 04/19/2017) 10 tablet 0  . ibuprofen (ADVIL,MOTRIN) 600 MG tablet Take 1 tablet (600 mg total) by mouth every 6 (six) hours as needed. (Patient not taking: Reported on 04/19/2017) 30 tablet 0  . losartan (  COZAAR) 25 MG tablet Take 1 tablet (25 mg total) by mouth daily. (Patient not taking: Reported on 04/19/2017) 90 tablet 3  . metoprolol tartrate (LOPRESSOR) 25 MG tablet Take 1 tablet (25 mg total) by mouth 2 (two) times daily. (Patient not taking: Reported on 04/19/2017) 180 tablet 3   No facility-administered medications prior to visit.     No Active Allergies     Objective:    BP (!) 179/84 (BP Location: Right Arm, Cuff Size: Normal)   Pulse 77   Temp 97.7 F (36.5 C) (Oral)   Ht _0  (1.651 m)   Wt 147 lb (66.7 kg)   SpO2 100%   BMI 24.46 kg/m  Wt Readings from Last 3 Encounters:  04/19/17 147 lb (66.7 kg)  04/04/17 142 lb (64.4 kg)  03/17/17 143 lb (64.9 kg)    Physical Exam  Constitutional: He is oriented to person, place, and time. He appears well-developed and well-nourished. He is cooperative.  HENT:  Head: Normocephalic and atraumatic.  Eyes: EOM are normal.  Neck: Normal range of motion.  Cardiovascular: Normal rate, regular rhythm, normal heart sounds and intact distal pulses. Exam reveals no gallop and no friction rub.  No murmur heard. Pulmonary/Chest: Effort normal and breath sounds normal. No tachypnea. No respiratory distress. He has no decreased breath sounds. He has no wheezes. He has no rhonchi. He has no rales. He exhibits no tenderness.  Abdominal: Soft. Bowel sounds are normal. There is CVA tenderness. No hernia. Hernia confirmed negative in the right  inguinal area and confirmed negative in the left inguinal area.  Musculoskeletal: Normal range of motion. He exhibits no edema.       Left knee: He exhibits normal range of motion and no swelling.       Lumbar back: He exhibits pain.       Back:  Neurological: He is alert and oriented to person, place, and time. Coordination normal.  Skin: Skin is warm and dry.  Psychiatric: He has a normal mood and affect. His behavior is normal. Judgment and thought content normal.  Nursing note and vitals reviewed.      Patient has been counseled extensively about nutrition and exercise as well as the importance of adherence with medications and regular follow-up. The patient was given clear instructions to go to ER or return to medical center if symptoms don't improve, worsen or new problems develop. The patient verbalized understanding.   Follow-up: Needs 2 week f/u for back pain Needs 3 month appointment for HTN/DM/HPL  Gildardo Pounds, FNP-BC [facility] and [facility], [place]   04/20/2017, 7:16 PM

## 2017-04-19 NOTE — Telephone Encounter (Signed)
[facility] pharmacy called and stated that Patients  TRUEPLUS LANCETS 28G MISC [[id number]][[id number]]  glucose blood (TRUE METRIX BLOOD GLUCOSE TEST) test strip [[id number]][[id number]]  Order has to be resent with how many times a day he will be testing as well as the diagnostic codes for each prescription in order for them to be able to fill it. Please fu

## 2017-04-19 NOTE — Progress Notes (Unsigned)
prevnar 

## 2017-04-19 NOTE — Telephone Encounter (Signed)
Corrected the medication instructions and resent to [facility].

## 2017-04-19 NOTE — Patient Instructions (Addendum)
Acute Pain, Adult Acute pain is a type of pain that may last for just a few days or as long as six months. It is often related to an illness, injury, or medical procedure. Acute pain may be mild, moderate, or severe. It usually goes away once your injury has healed or you are no longer ill. Pain can make it hard for you to do daily activities. It can cause anxiety and lead to other problems if left untreated. Treatment depends on the cause and severity of your acute pain. Follow these instructions at home:  Check your pain level as told by your health care provider.  Take over-the-counter and prescription medicines only as told by your health care provider.  If you are taking prescription pain medicine: ? Ask your health care provider about taking a stool softener or laxative to prevent constipation. ? Do not stop taking the medicine suddenly. Talk to your health care provider about how and when to discontinue prescription pain medicine. ? If your pain is severe, do not take more pills than instructed by your health care provider. ? Do not take other over-the-counter pain medicines in addition to this medicine unless told by your health care provider. ? Do not drive or operate heavy machinery while taking prescription pain medicine.  Apply ice or heat as told by your health care provider. These may reduce swelling and pain.  Ask your health care provider if other strategies such as distraction, relaxation, or physical therapies can help your pain.  Keep all follow-up visits as told by your health care provider. This is important. Contact a health care provider if:  You have pain that is not controlled by medicine.  Your pain does not improve or gets worse.  You have side effects from pain medicines, such as vomitingor confusion. Get help right away if:  You have severe pain.  You have trouble breathing.  You lose consciousness.  You have chest pain or pressure that lasts for more  than a few minutes. Along with the chest pain you may: ? Have pain or discomfort in one or both arms, your back, neck, jaw, or stomach. ? Have shortness of breath. ? Break out in a cold sweat. ? Feel nauseous. ? Become light-headed. These symptoms may represent a serious problem that is an emergency. Do not wait to see if the symptoms will go away. Get medical help right away. Call your local emergency services (911 in the [place] Do not drive yourself to the hospital. This information is not intended to replace advice given to you by your health care provider. Make sure you discuss any questions you have with your health care provider. Document Released: 03/22/2015 Document Revised: 08/14/2015 Document Reviewed: 03/22/2015 Elsevier Interactive Patient Education  2018 [employer]  Back Pain, Adult Back pain is very common. The pain often gets better over time. The cause of back pain is usually not dangerous. Most people can learn to manage their back pain on their own. Follow these instructions at home: Watch your back pain for any changes. The following actions may help to lessen any pain you are feeling:  Stay active. Start with short walks on flat ground if you can. Try to walk farther each day.  Exercise regularly as told by your doctor. Exercise helps your back heal faster. It also helps avoid future injury by keeping your muscles strong and flexible.  Do not sit, drive, or stand in one place for more than 30 minutes.  Do  not stay in bed. Resting more than 1-2 days can slow down your recovery.  Be careful when you bend or lift an object. Use good form when lifting: ? Bend at your knees. ? Keep the object close to your body. ? Do not twist.  Sleep on a firm mattress. Lie on your side, and bend your knees. If you lie on your back, put a pillow under your knees.  Take medicines only as told by your doctor.  Put ice on the injured area. ? Put ice in a plastic bag. ? Place a  towel between your skin and the bag. ? Leave the ice on for 20 minutes, 2-3 times a day for the first 2-3 days. After that, you can switch between ice and heat packs.  Avoid feeling anxious or stressed. Find good ways to deal with stress, such as exercise.  Maintain a healthy weight. Extra weight puts stress on your back.  Contact a doctor if:  You have pain that does not go away with rest or medicine.  You have worsening pain that goes down into your legs or buttocks.  You have pain that does not get better in one week.  You have pain at night.  You lose weight.  You have a fever or chills. Get help right away if:  You cannot control when you poop (bowel movement) or pee (urinate).  Your arms or legs feel weak.  Your arms or legs lose feeling (numbness).  You feel sick to your stomach (nauseous) or throw up (vomit).  You have belly (abdominal) pain.  You feel like you may pass out (faint). This information is not intended to replace advice given to you by your health care provider. Make sure you discuss any questions you have with your health care provider. Document Released: 08/24/2007 Document Revised: 08/13/2015 Document Reviewed: 07/09/2013 Elsevier Interactive Patient Education  [employer].

## 2017-04-20 ENCOUNTER — Encounter: Payer: Self-pay | Admitting: Nurse Practitioner

## 2017-04-20 LAB — HEPATITIS C ANTIBODY: Hep C Virus Ab: 0.2 s/co ratio (ref 0.0–0.9)

## 2017-04-20 LAB — PSA: PROSTATE SPECIFIC AG, SERUM: 2 ng/mL (ref 0.0–4.0)

## 2017-04-20 LAB — TSH: TSH: 1.79 u[IU]/mL (ref 0.450–4.500)

## 2017-04-21 ENCOUNTER — Other Ambulatory Visit: Payer: Self-pay | Admitting: Pharmacist

## 2017-05-16 ENCOUNTER — Ambulatory Visit: Payer: BLUE CROSS/BLUE SHIELD | Admitting: Nurse Practitioner

## 2017-05-29 ENCOUNTER — Encounter: Payer: Self-pay | Admitting: Registered"

## 2017-05-29 ENCOUNTER — Encounter: Payer: Medicaid Other | Attending: Nurse Practitioner | Admitting: Registered"

## 2017-05-29 ENCOUNTER — Telehealth: Payer: Self-pay | Admitting: Nurse Practitioner

## 2017-05-29 DIAGNOSIS — Z713 Dietary counseling and surveillance: Secondary | ICD-10-CM | POA: Diagnosis present

## 2017-05-29 DIAGNOSIS — E118 Type 2 diabetes mellitus with unspecified complications: Secondary | ICD-10-CM | POA: Insufficient documentation

## 2017-05-29 MED ORDER — ACCU-CHEK SOFTCLIX LANCETS MISC: 12 refills | Status: DC

## 2017-05-29 MED ORDER — GLUCOSE BLOOD VI STRP: ORAL_STRIP | 12 refills | Status: DC

## 2017-05-29 NOTE — Progress Notes (Signed)
Diabetes Self-Management Education  Visit Type: First/Initial  Appt. Start Time: 0800 Appt. End Time: 0930  05/29/2017  [name], identified by name and date of birth, is a 67 y.o. male with a diagnosis of Diabetes: Type 2.   ASSESSMENT This patient is accompanied in the office by his Interpreter.  Patient is concerned about low blood sugar and states he has 4-5x month. Patient states sometimes he gets hypoglycemia while playing soccer on the weekends. Pt reports symptoms of shaking when BG is less than 90. Patient states he likes dates (medjool), but only eats them when BG is low, and then may eat 7 dates (RD estimates 125 grams of CHO) Pt states when he feels weak and shakey BG is 70-90 mg/dL he will eat 7 dates and 1 hour later his blood sugar will be 130-140 mg/dL. Pt states he has not checked blood sugar in about 3 weeks because he threw away meter after he ran out of strips.   RD was having a hard time understanding his reported FBG and PPBG and timing with meals. RD provided instruction on when to check BG and general instructions to identify foods which can raise blood sugar. RD asked patient to return within 2 weeks with blood sugar log to better counsel patient on current habits and how to safely bring down A1c and have fewer hypoglycemic events.  Chronic conditions assessment: Patient states it has been 2 yrs since last eye exam due to change in insurance after retirement. Patient reports appropriate foot care.     At end of visit pt had questions about how medications work, RD explained basic mechanism and will review next visit when there is more time to explain how we get energy from diet and use Arabic handout on insulin resistance.   Provided Accu-Chek Guide Lot #: [id number] Exp: 06/03/2018 BG: 103 mg/dL (pt states he is fasting at this time)  Diabetes Self-Management Education - 05/29/17 0805      Visit Information   Visit Type  First/Initial      Initial Visit    Diabetes Type  Type 2    Are you currently following a meal plan?  No    Are you taking your medications as prescribed?  Yes glipizide bid, Levemir 20 units, metformin 2,000 mg/day    Date Diagnosed  2005      Health Coping   How would you rate your overall health?  Good      Psychosocial Assessment   Patient Belief/Attitude about Diabetes  Motivated to manage diabetes    Self-care barriers  English as a second language    Other persons present  Interpreter    How often do you need to have someone help you when you read instructions, pamphlets, or other written materials from your doctor or pharmacy?  5 - Always    What is the last grade level you completed in school?  high school      Complications   Last HgB A1C per patient/outside source  11.4 % per chart    How often do you check your blood sugar?  3-4 times / week    Fasting Blood glucose range (mg/dL)  161-096;[id number];180-200 [id number]    Postprandial Blood glucose range (mg/dL)  >782>200    Number of hypoglycemic episodes per month  5    Can you tell when your blood sugar is low?  Yes    What do you do if your blood sugar is low?  eats  something, 7 medjoul dates sometimes    Have you had a dilated eye exam in the past 12 months?  No    Have you had a dental exam in the past 12 months?  No    Are you checking your feet?  Yes    How many days per week are you checking your feet?  7      Dietary Intake   Breakfast  fava beans, 2 pita bread, water OR cereal, milk 11 am    Lunch  sauce, meat, vegetable, kesra (sudanese tortilla 3 oz 38 g)    Dinner  same as lunch except on weekends eats with friends which includes sweets 6:30 p    Beverage(s)  water, milk      Exercise   Exercise Type  Moderate (swimming / aerobic walking) soccer on the weekends    How many days per week to you exercise?  1    How many minutes per day do you exercise?  120    Total minutes per week of exercise  120      Patient Education   Previous Diabetes Education   No    Medications  Reviewed patients medication for diabetes, action, purpose, timing of dose and side effects.    Monitoring  Yearly dilated eye exam;Identified appropriate SMBG and/or A1C goals.;Taught/discussed recording of test results and interpretation of SMBG.    Acute complications  Other (comment) taught general idea of treating hypoglycemia    Chronic complications  Assessed and discussed foot care and prevention of foot problems      Individualized Goals (developed by patient)   Nutrition  Adjust meds/carbs with exercise as discussed    Monitoring   test my blood glucose as discussed    Reducing Risk  treat hypoglycemia with 15 grams of carbs if blood glucose less than 70mg /dL      Outcomes   Expected Outcomes  Demonstrated interest in learning. Expect positive outcomes    Future DMSE  2 wks    Program Status  Not Completed     Individualized Plan for Diabetes Self-Management Training:   Learning Objective:  Patient will have a greater understanding of diabetes self-management. Patient education plan is to attend individual and/or group sessions per assessed needs and concerns.   Patient Instructions  website to help find an eye doctor: [email or web address].BounceThru.fi  For new glucometer: 1) Ask pharmacist if your insurance covers this meter. If this meter is not covered, find out which one is covered. 2) Give that information to your doctor's office to get a prescription for it.  Take your blood sugar fasting in the morning, after 1 meal, and when you feel like you are having low blood sugar. Write down your blood sugar numbers and take your log book to your doctors  Make sure to eat something before playing soccer.  Expected Outcomes:  Demonstrated interest in learning. Expect positive outcomes  Education material provided: All About Blood Glucose (in Arabic)  If problems or questions, patient to contact team via:  Phone  Future DSME appointment: 2 wks

## 2017-05-29 NOTE — Patient Instructions (Addendum)
website to help find an eye doctor: https://[email or web address]/find-[email or web address]-doctor  For new glucometer: 1) Ask pharmacist if your insurance covers this meter. If this meter is not covered, find out which one is covered. 2) Give that information to your doctor's office to get a prescription for it.  Take your blood sugar fasting in the morning, after 1 meal, and when you feel like you are having low blood sugar. Write down your blood sugar numbers and take your log book to your doctors  Make sure to eat something before playing soccer.

## 2017-05-29 NOTE — Telephone Encounter (Signed)
Accu-chek lancets and strips sent to requested pharmacy

## 2017-05-29 NOTE — Telephone Encounter (Signed)
Pt came to the office to request strip and lancet fro his Accu-Check, [facility] [address] [place], [place] - [id number] Please follow up

## 2017-05-31 ENCOUNTER — Other Ambulatory Visit: Payer: Self-pay | Admitting: Pharmacist

## 2017-05-31 MED ORDER — ACCU-CHEK SOFTCLIX LANCETS MISC: 12 refills | Status: DC

## 2017-06-01 ENCOUNTER — Other Ambulatory Visit: Payer: Self-pay | Admitting: Pharmacist

## 2017-06-01 MED ORDER — GLUCOSE BLOOD VI STRP: ORAL_STRIP | 12 refills | Status: DC

## 2017-06-12 ENCOUNTER — Ambulatory Visit: Payer: Medicaid Other | Attending: Nurse Practitioner | Admitting: Nurse Practitioner

## 2017-06-12 ENCOUNTER — Encounter: Payer: Self-pay | Admitting: Nurse Practitioner

## 2017-06-12 VITALS — BP 154/80 | HR 67 | Temp 97.9°F | Ht 65.0 in | Wt 150.4 lb

## 2017-06-12 DIAGNOSIS — M25562 Pain in left knee: Secondary | ICD-10-CM | POA: Diagnosis present

## 2017-06-12 DIAGNOSIS — I252 Old myocardial infarction: Secondary | ICD-10-CM | POA: Diagnosis not present

## 2017-06-12 DIAGNOSIS — K130 Diseases of lips: Secondary | ICD-10-CM | POA: Diagnosis not present

## 2017-06-12 DIAGNOSIS — I2510 Atherosclerotic heart disease of native coronary artery without angina pectoris: Secondary | ICD-10-CM | POA: Diagnosis not present

## 2017-06-12 DIAGNOSIS — E1165 Type 2 diabetes mellitus with hyperglycemia: Secondary | ICD-10-CM | POA: Diagnosis not present

## 2017-06-12 DIAGNOSIS — E118 Type 2 diabetes mellitus with unspecified complications: Secondary | ICD-10-CM

## 2017-06-12 DIAGNOSIS — I272 Pulmonary hypertension, unspecified: Secondary | ICD-10-CM | POA: Diagnosis not present

## 2017-06-12 DIAGNOSIS — Z79899 Other long term (current) drug therapy: Secondary | ICD-10-CM | POA: Insufficient documentation

## 2017-06-12 DIAGNOSIS — Z794 Long term (current) use of insulin: Secondary | ICD-10-CM | POA: Diagnosis not present

## 2017-06-12 DIAGNOSIS — M25561 Pain in right knee: Secondary | ICD-10-CM | POA: Diagnosis not present

## 2017-06-12 DIAGNOSIS — Z7982 Long term (current) use of aspirin: Secondary | ICD-10-CM | POA: Diagnosis not present

## 2017-06-12 DIAGNOSIS — E11649 Type 2 diabetes mellitus with hypoglycemia without coma: Secondary | ICD-10-CM | POA: Insufficient documentation

## 2017-06-12 DIAGNOSIS — Z951 Presence of aortocoronary bypass graft: Secondary | ICD-10-CM | POA: Insufficient documentation

## 2017-06-12 DIAGNOSIS — I10 Essential (primary) hypertension: Secondary | ICD-10-CM | POA: Insufficient documentation

## 2017-06-12 LAB — GLUCOSE, POCT (MANUAL RESULT ENTRY): POC GLUCOSE: 119 mg/dL — AB (ref 70–99)

## 2017-06-12 MED ORDER — METFORMIN HCL 1000 MG PO TABS: 1000.0000 mg | ORAL_TABLET | Freq: Two times a day (BID) | ORAL | 2 refills | Status: DC

## 2017-06-12 MED ORDER — GLUCOSE BLOOD VI STRP
ORAL_STRIP | 12 refills | Status: DC
Start: 2017-06-12 — End: 2017-07-18

## 2017-06-12 MED ORDER — GLIPIZIDE 10 MG PO TABS: 10.0000 mg | ORAL_TABLET | Freq: Two times a day (BID) | ORAL | 3 refills | Status: DC

## 2017-06-12 MED ORDER — INSULIN PEN NEEDLE 31G X 5 MM MISC: 1 refills | Status: DC

## 2017-06-12 MED ORDER — INSULIN DETEMIR 100 UNIT/ML FLEXPEN: 20.0000 [IU] | PEN_INJECTOR | Freq: Every day | SUBCUTANEOUS | 6 refills | Status: DC

## 2017-06-12 MED ORDER — LOSARTAN POTASSIUM 25 MG PO TABS: 25.0000 mg | ORAL_TABLET | Freq: Every day | ORAL | 3 refills | Status: DC

## 2017-06-12 MED ORDER — ACCU-CHEK SOFTCLIX LANCETS MISC
12 refills | Status: DC
Start: 2017-06-12 — End: 2017-12-26

## 2017-06-12 MED ORDER — MELOXICAM 7.5 MG PO TABS: 7.5000 mg | ORAL_TABLET | Freq: Every day | ORAL | 0 refills | Status: DC

## 2017-06-12 MED ORDER — METOPROLOL TARTRATE 25 MG PO TABS: 25.0000 mg | ORAL_TABLET | Freq: Two times a day (BID) | ORAL | 3 refills | Status: DC

## 2017-06-12 MED FILL — TRUEPLUS PEN NDL 31GX3/16: 31G X 5 MM | 30 days supply | Qty: 100 | Fill #0

## 2017-06-12 MED FILL — TRUEPLUS PEN NDL 31GX3/16": 31G X 5 MM | 30 days supply | Qty: 100 | Fill #0

## 2017-06-12 NOTE — Progress Notes (Signed)
Assessment & Plan:  Densil was seen today for follow-up and knee pain.  Diagnoses and all orders for this visit:  Acute pain of both knees -     meloxicam (MOBIC) 7.5 MG tablet; Take 1 tablet (7.5 mg total) by mouth daily. May also try XS tylenol for pain ICE application to affected areas as tolerated for pain relief Knee padding or mat when kneeling to pray  Lip lesion -     HSV Type I/II IgG, IgMw/ reflex  Essential hypertension -     losartan (COZAAR) 25 MG tablet; Take 1 tablet (25 mg total) by mouth daily. -     metoprolol tartrate (LOPRESSOR) 25 MG tablet; Take 1 tablet (25 mg total) by mouth 2 (two) times daily. Continue all antihypertensives as prescribed.  Remember to bring in your blood pressure log with you for your follow up appointment.  DASH/Mediterranean Diets are healthier choices for HTN.    Type 2 diabetes mellitus with complication, unspecified whether long term insulin use (HCC) -     Glucose (CBG) -     Insulin Detemir (LEVEMIR) 100 UNIT/ML Pen; Inject 20 Units into the skin daily at 10 pm. -     glipiZIDE (GLUCOTROL) 10 MG tablet; Take 1 tablet (10 mg total) by mouth 2 (two) times daily before a meal. -     metFORMIN (GLUCOPHAGE) 1000 MG tablet; Take 1 tablet (1,000 mg total) by mouth 2 (two) times daily with a meal. -     glucose blood (ACCU-CHEK AVIVA PLUS) test strip; Use as instructed to check blood sugar 3 times weekly before breakfast. E11.9 -     Insulin Pen Needle (B-D UF III MINI PEN NEEDLES) 31G X 5 MM MISC; Use as instructed -     ACCU-CHEK SOFTCLIX LANCETS lancets; Use as instructed to check blood sugar 3 times weekly before breakfast. E11.9 Continue blood sugar control as discussed in office today, low carbohydrate diet, and regular physical exercise as tolerated, 150 minutes per week (30 min each day, 5 days per week, or 50 min 3 days per week). Keep blood sugar logs with fasting goal of 80-130 mg/dl, post prandial less than 180.  For  Hypoglycemia: BS <60 and Hyperglycemia BS >400; contact the clinic ASAP. Annual eye exams and foot exams are recommended.  Coronary artery disease involving native coronary artery of native heart without angina pectoris -     metoprolol tartrate (LOPRESSOR) 25 MG tablet; Take 1 tablet (25 mg total) by mouth 2 (two) times daily.   Patient has been counseled on age-appropriate routine health concerns for screening and prevention. These are reviewed and up-to-date. Referrals have been placed accordingly. Immunizations are up-to-date or declined.    Subjective:   Chief Complaint  Patient presents with  . Follow-up    Pt stated his back pain is gone.   . Knee Pain    Pt stated both of his knee are in pain for 2-3 weeks. Pt' stated he can't stand up to pull up his pant without pain.    HPI Carl Clark 67 y.o. male presents to office today for follow up: Back Pain has resolved however he now has complaints of bilateral knee pain.  VRI was used to communicate directly with patient for the entire encounter including providing detailed patient instructions.   Knee Pain Patient presents with knee pain involving the bilateral knees. Onset of the symptoms was several weeks ago. Inciting event: none known. Current symptoms include giving out  and pain located in the patellar, medial and lateral knee.  Pain is aggravated by kneeling to pray on hard surfaces several times per day. He does not use a mat or pad under his knees when kneeling to pray.   Patient has had prior right knee problems. Evaluation to date: none. Treatment to date: none. Pain 9/10. He had been prescribed voltaren gel in the past however he has not taken or tried any medication for pain relief.   Essential Hypertension Chronic. He has not taken his blood pressure medication in 2 days. He is not taking his medication as prescribed. He is not checking his blood pressure at home. He was instructed to take his losartan and  metoprolol every day even if he is fasting for labs. WIll not make any medication adjustments today. Will recheck in a few weeks and decide if changes will need to be made at that time. Denies chest pain, shortness of breath, palpitations, lightheadedness, dizziness, headaches or BLE edema.  BP Readings from Last 3 Encounters:  06/12/17 (!) 154/80  04/19/17 (!) 179/84  04/12/17 (!) 144/80   Lip Lesion His lips are dry and cracked today. There is an area in the center of his lip where it has cracked and appears to have dried blood. He states he was given a medication for this in the past that he took by mouth which provided relief of the pain in his lips. This does not resemble a chancre or HSV lesion so I have recommended he use a petroleum based jelly on his lips until lab work for HSV is complete.     Review of Systems  Constitutional: Negative for fever, malaise/fatigue and weight loss.  HENT: Negative.  Negative for nosebleeds.   Eyes: Negative.  Negative for blurred vision, double vision and photophobia.  Respiratory: Negative.  Negative for cough and shortness of breath.   Cardiovascular: Negative.  Negative for chest pain, palpitations and leg swelling.  Gastrointestinal: Negative.  Negative for heartburn, nausea and vomiting.  Musculoskeletal: Positive for joint pain. Negative for myalgias.       SEE HPI  Skin: Positive for rash.  Neurological: Negative.  Negative for dizziness, focal weakness, seizures and headaches.  Psychiatric/Behavioral: Negative.  Negative for suicidal ideas.    Past Medical History:  Diagnosis Date  . CAD (coronary artery disease) 03/16/2017   S/p NSTEMI 6/18 >> LHC: multivessel CAD >> s/p CABG // Echo 6/18: Mild LVH, EF 60-65, normal wall motion, grade 1 diastolic dysfunction, normal RV SF, PASP 36 (borderline pulmonary hypertension) // Pre-CABG Dopplers 09/13/16: Bilateral ICA 1-39  . Family history of anesthesia complication    "never had anesthesia"  (07/14/2012)  . History of non-ST elevation myocardial infarction (NSTEMI) 09/12/2016   Treated with CABG  . History of rectal bleeding    secondary to diverticulum affecting right hepatic flexure - 06/2012  . Hypertension   . Type II diabetes mellitus ([place])     Past Surgical History:  Procedure Laterality Date  . COLONOSCOPY Left 07/14/2012   Procedure: COLONOSCOPY;  Surgeon: Arta Silence, MD;  Location: [facility] ENDOSCOPY;  Service: Endoscopy;  Laterality: Left;  . CORONARY ARTERY BYPASS GRAFT N/A 09/15/2016   Procedure: CORONARY ARTERY BYPASS GRAFTING (CABG) x 3, LIMA to LAD, SVG to DIAGONAL, SVG to OM2, USING LEFT MAMMARY ARTERY AND RIGHT GREATER SAPHENOUS VEIN HARVESTED ENDOSCOPICALLY;  Surgeon: Grace Isaac, MD;  Location: [place];  Service: Open Heart Surgery;  Laterality: N/A;  . ESOPHAGOGASTRODUODENOSCOPY N/A 07/13/2012  Procedure: ESOPHAGOGASTRODUODENOSCOPY (EGD);  Surgeon: Arta Silence, MD;  Location: [facility] ENDOSCOPY;  Service: Endoscopy;  Laterality: N/A;  pt in ED A8  . LEFT HEART CATH AND CORONARY ANGIOGRAPHY N/A 09/12/2016   Procedure: Left Heart Cath and Coronary Angiography;  Surgeon: Sherren Mocha, MD;  Location: [facility] CV LAB;  Service: Cardiovascular;  Laterality: N/A;  . NO PAST SURGERIES    . TEE WITHOUT CARDIOVERSION N/A 09/15/2016   Procedure: TRANSESOPHAGEAL ECHOCARDIOGRAM (TEE);  Surgeon: Grace Isaac, MD;  Location: [facility];  Service: Open Heart Surgery;  Laterality: N/A;    Family History  Problem Relation Age of Onset  . Diabetes Mother   . Diabetes Father     Social History Reviewed with no changes to be made today.   Outpatient Medications Prior to Visit  Medication Sig Dispense Refill  . aspirin 81 MG tablet Take 1 tablet (81 mg total) by mouth daily. 30 tablet 12  . atorvastatin (LIPITOR) 80 MG tablet Take 1 tablet (80 mg total) by mouth daily at 6 PM. 90 tablet 1  . Blood Glucose Monitoring Suppl (TRUE METRIX METER) w/Device KIT Use as  instructed 1 kit 0  . ACCU-CHEK SOFTCLIX LANCETS lancets Use as instructed to check blood sugar 3 times weekly before breakfast. E11.9 100 each 12  . glipiZIDE (GLUCOTROL) 10 MG tablet Take 1 tablet (10 mg total) by mouth 2 (two) times daily before a meal. 60 tablet 3  . glucose blood (ACCU-CHEK AVIVA PLUS) test strip Use as instructed to check blood sugar 3 times weekly before breakfast. E11.9 100 each 12  . Insulin Detemir (LEVEMIR) 100 UNIT/ML Pen Inject 20 Units into the skin daily at 10 pm. 15 mL 6  . losartan (COZAAR) 25 MG tablet Take 1 tablet (25 mg total) by mouth daily. 90 tablet 3  . metFORMIN (GLUCOPHAGE) 1000 MG tablet Take 1 tablet (1,000 mg total) by mouth 2 (two) times daily with a meal. 60 tablet 2  . metoprolol tartrate (LOPRESSOR) 25 MG tablet Take 1 tablet (25 mg total) by mouth 2 (two) times daily. 180 tablet 3  . diclofenac sodium (VOLTAREN) 1 % GEL Apply 2 g topically 4 (four) times daily. (Patient not taking: Reported on 06/12/2017) 100 g 1  . cyclobenzaprine (FLEXERIL) 10 MG tablet Take 1 tablet (10 mg total) by mouth 2 (two) times daily as needed for muscle spasms. (Patient not taking: Reported on 06/12/2017) 30 tablet 0  . pneumococcal 13-valent conjugate vaccine (PREVNAR 13) SUSP injection To be administered by the pharmacist (Patient not taking: Reported on 06/12/2017) 0.5 mL 0   No facility-administered medications prior to visit.     No Active Allergies     Objective:    BP (!) 154/80 (BP Location: Right Arm, Patient Position: Sitting, Cuff Size: Normal)   Pulse 67   Temp 97.9 F (36.6 C) (Oral)   Ht '5\' 5"'  (1.651 m)   Wt 150 lb 6.4 oz (68.2 kg)   SpO2 99%   BMI 25.03 kg/m  Wt Readings from Last 3 Encounters:  06/12/17 150 lb 6.4 oz (68.2 kg)  04/19/17 147 lb (66.7 kg)  04/04/17 142 lb (64.4 kg)    Physical Exam  Constitutional: He is oriented to person, place, and time. He appears well-developed and well-nourished. He is cooperative.  HENT:  Head:  Normocephalic and atraumatic.  Mouth/Throat: Uvula is midline and oropharynx is clear and moist. Mucous membranes are dry.    Eyes: EOM are normal.  Neck: Normal  range of motion.  Cardiovascular: Normal rate, regular rhythm, normal heart sounds and intact distal pulses. Exam reveals no gallop and no friction rub.  No murmur heard. Pulmonary/Chest: Effort normal and breath sounds normal. No tachypnea. No respiratory distress. He has no decreased breath sounds. He has no wheezes. He has no rhonchi. He has no rales. He exhibits no tenderness.  Abdominal: Soft. Bowel sounds are normal.  Musculoskeletal: Normal range of motion. He exhibits no edema.       Right knee: He exhibits normal range of motion and no swelling. Tenderness found. Medial joint line, lateral joint line and patellar tendon tenderness noted.       Left knee: Tenderness found. Medial joint line, lateral joint line and patellar tendon tenderness noted.  Patient endorses pain with passive flexion and extension of bilateral knees.   Neurological: He is alert and oriented to person, place, and time. Coordination normal.  Skin: Skin is warm and dry.  Psychiatric: He has a normal mood and affect. His behavior is normal. Judgment and thought content normal.  Nursing note and vitals reviewed.      Patient has been counseled extensively about nutrition and exercise as well as the importance of adherence with medications and regular follow-up. The patient was given clear instructions to go to ER or return to medical center if symptoms don't improve, worsen or new problems develop. The patient verbalized understanding.   Follow-up: Return in about 5 weeks (around 07/17/2017) for Bilateral knee pain/DM.   Gildardo Pounds, FNP-BC [facility] and [place], [place]   06/12/2017, 12:58 PM

## 2017-06-14 LAB — HSV TYPE I/II IGG, IGMW/ REFLEX
HSV 1 GLYCOPROTEIN G AB, IGG: 38.2 {index} — AB (ref 0.00–0.90)
HSV 1 IgM: <1:10 {titer}
HSV 2 IgG, Type Spec: <0.91 index (ref 0.00–0.90)
HSV 2 IgM: <1:10 {titer}

## 2017-06-15 ENCOUNTER — Encounter: Payer: Medicaid Other | Admitting: Registered"

## 2017-06-15 DIAGNOSIS — Z713 Dietary counseling and surveillance: Secondary | ICD-10-CM | POA: Diagnosis not present

## 2017-06-15 DIAGNOSIS — E1165 Type 2 diabetes mellitus with hyperglycemia: Secondary | ICD-10-CM | POA: Insufficient documentation

## 2017-06-15 NOTE — Progress Notes (Signed)
Diabetes Self-Management Education  Visit Type: Follow-up  Appt. Start Time: 0805 Appt. End Time: 0900  06/15/2017  Carl Clark, identified by name and date of birth, is a 67 y.o. male with a diagnosis of Diabetes: Type 2.   ASSESSMENT Patient states he has reduced portion size and is only eat 2 meals instead of 3 meals a day because patient states he is afraid eating too much will raise his blood pressure. Patient has had several BG below 70. RD tried to reassure patient it is okay to eat 3 meals/day.  Patient brought in blood sugar log 05/29/17 through 06/15/17 and it will be sent in to scan center.  Patient states he is out of supplies to test blood sugar and wants to keep using the Accu-chek Guide but [facility] they only provide the [employer]che[employer] supplies. Patient can request the prescription be sent to another pharmacy that accepts medicaid and that carries the Accu-Chek Guide. RD left message with the [facility], to give message to the patient.  Diabetes Self-Management Education - 06/15/17 0758      Visit Information   Visit Type  Follow-up      Initial Visit   Diabetes Type  Type 2      Psychosocial Assessment   Other persons present  Interpreter      Complications   How often do you check your blood sugar?  3-4 times/day    Fasting Blood glucose range (mg/dL)  <16;10-960;454-098<70;70-129;130-179 [phone]    Postprandial Blood glucose range (mg/dL)  78-295;621-308;657-846;>96270-129;130-179;180-200;>200 avg 120s ~1x week goes just over 200    Number of hypoglycemic episodes per month  2    Can you tell when your blood sugar is low?  Yes    What do you do if your blood sugar is low?  eats bread    Number of hyperglycemic episodes per week  1      Dietary Intake   Breakfast  skips    Lunch  fava beans, bread    Dinner  lentil with bread    Beverage(s)  water, milk      Patient Education   Nutrition management   Meal options for control of  blood glucose level and chronic complications. provided meal samples from Arabic Gestational DM info    Monitoring  Purpose and frequency of SMBG.      Individualized Goals (developed by patient)   Nutrition  General guidelines for healthy choices and portions discussed    Monitoring   test my blood glucose as discussed      Outcomes   Expected Outcomes  Demonstrated interest in learning. Expect positive outcomes    Future DMSE  PRN    Program Status  Completed      Subsequent Visit   Since your last visit have you continued or begun to take your medications as prescribed?  Yes    Since your last visit have you had your blood pressure checked?  Yes    Is your most recent blood pressure lower, unchanged, or higher since your last visit?  Higher    Since your last visit, are you checking your blood glucose at least once a day?  Yes     Individualized Plan for Diabetes Self-Management Training:   Learning Objective:  Patient will have a greater understanding of diabetes self-management. Patient education plan is to attend individual and/or group sessions per assessed needs and concerns.  Patient Instructions  You can reduce the number of times you check your blood sugar. Instead of checking every day, check at the following times: 1. 3 times per week check your blood sugar in the morning before eating. 2. If you are having low blood sugar symptoms check your blood sugar  if less than 70 mg/dL treat with fast-acting carbohydrate  If less than 60 call your doctor. 3. If you eat a meal with a lot of bread, sweets or other carbohydrates, check 2 hours after the meal. Goal is to keep your blood sugar less than 180 mg/dL  Your blood sugar appears to be in pretty good control. You do not need to reduce the number of meals.     Expected Outcomes:  Demonstrated interest in learning. Expect positive outcomes  Education material provided: From the GDM handouts in Arabic Sample consistent  carbohydrate 3-day meal plan, List of culturally specific foods with carbohydrate estimates.  If problems or questions, patient to contact team via:  Phone  Future DSME appointment: PRN

## 2017-06-15 NOTE — Patient Instructions (Addendum)
You can reduce the number of times you check your blood sugar. Instead of checking every day, check at the following times: 1. 3 times per week check your blood sugar in the morning before eating. 2. If you are having low blood sugar symptoms check your blood sugar  if less than 70 mg/dL treat with fast-acting carbohydrate  If less than 60 call your doctor. 3. If you eat a meal with a lot of bread, sweets or other carbohydrates, check 2 hours after the meal. Goal is to keep your blood sugar less than 180 mg/dL  Your blood sugar appears to be in pretty good control. You do not need to reduce the number of meals.

## 2017-06-18 ENCOUNTER — Encounter (HOSPITAL_COMMUNITY): Payer: Self-pay | Admitting: Emergency Medicine

## 2017-06-18 ENCOUNTER — Other Ambulatory Visit: Payer: Self-pay | Admitting: Nurse Practitioner

## 2017-06-18 DIAGNOSIS — I252 Old myocardial infarction: Secondary | ICD-10-CM | POA: Diagnosis not present

## 2017-06-18 DIAGNOSIS — M25562 Pain in left knee: Secondary | ICD-10-CM | POA: Diagnosis not present

## 2017-06-18 DIAGNOSIS — Z951 Presence of aortocoronary bypass graft: Secondary | ICD-10-CM | POA: Diagnosis not present

## 2017-06-18 DIAGNOSIS — Z79899 Other long term (current) drug therapy: Secondary | ICD-10-CM | POA: Insufficient documentation

## 2017-06-18 DIAGNOSIS — Z794 Long term (current) use of insulin: Secondary | ICD-10-CM | POA: Insufficient documentation

## 2017-06-18 DIAGNOSIS — I2510 Atherosclerotic heart disease of native coronary artery without angina pectoris: Secondary | ICD-10-CM | POA: Insufficient documentation

## 2017-06-18 DIAGNOSIS — Z7982 Long term (current) use of aspirin: Secondary | ICD-10-CM | POA: Insufficient documentation

## 2017-06-18 DIAGNOSIS — E119 Type 2 diabetes mellitus without complications: Secondary | ICD-10-CM | POA: Diagnosis not present

## 2017-06-18 DIAGNOSIS — M6281 Muscle weakness (generalized): Secondary | ICD-10-CM | POA: Diagnosis not present

## 2017-06-18 DIAGNOSIS — I10 Essential (primary) hypertension: Secondary | ICD-10-CM | POA: Diagnosis not present

## 2017-06-18 LAB — BASIC METABOLIC PANEL
ANION GAP: 6 (ref 5–15)
BUN: 19 mg/dL (ref 6–20)
CALCIUM: 8.6 mg/dL — AB (ref 8.9–10.3)
CHLORIDE: 104 mmol/L (ref 101–111)
CO2: 29 mmol/L (ref 22–32)
Creatinine, Ser: 1.05 mg/dL (ref 0.61–1.24)
GFR calc non Af Amer: >60 mL/min (ref >60)
Glucose, Bld: 132 mg/dL — ABNORMAL HIGH (ref 65–99)
Potassium: 3.5 mmol/L (ref 3.5–5.1)
Sodium: 139 mmol/L (ref 135–145)

## 2017-06-18 LAB — CBC
HCT: 34.3 % — ABNORMAL LOW (ref 39.0–52.0)
HEMOGLOBIN: 11.2 g/dL — AB (ref 13.0–17.0)
MCH: 30.9 pg (ref 26.0–34.0)
MCHC: 32.7 g/dL (ref 30.0–36.0)
MCV: 94.8 fL (ref 78.0–100.0)
Platelets: 255 10*3/uL (ref 150–400)
RBC: 3.62 MIL/uL — AB (ref 4.22–5.81)
RDW: 13.1 % (ref 11.5–15.5)
WBC: 4.6 10*3/uL (ref 4.0–10.5)

## 2017-06-18 LAB — URINALYSIS, ROUTINE W REFLEX MICROSCOPIC
Bilirubin Urine: NEGATIVE
Glucose, UA: NEGATIVE mg/dL
HGB URINE DIPSTICK: NEGATIVE
Ketones, ur: NEGATIVE mg/dL
LEUKOCYTES UA: NEGATIVE
Nitrite: NEGATIVE
Protein, ur: NEGATIVE mg/dL
SPECIFIC GRAVITY, URINE: 1.018 (ref 1.005–1.030)
pH: 7 (ref 5.0–8.0)

## 2017-06-18 LAB — CBG MONITORING, ED: GLUCOSE-CAPILLARY: 141 mg/dL — AB (ref 65–99)

## 2017-06-18 MED ORDER — VALACYCLOVIR HCL 1 G PO TABS: 2000.0000 mg | ORAL_TABLET | Freq: Two times a day (BID) | ORAL | 1 refills | Status: AC

## 2017-06-18 NOTE — ED Triage Notes (Signed)
Reports left leg weakness for two weeks but reports walking today and falling down three times.  Denies any injury or pain.

## 2017-06-19 ENCOUNTER — Emergency Department (HOSPITAL_COMMUNITY): Payer: Medicaid Other

## 2017-06-19 ENCOUNTER — Emergency Department (HOSPITAL_COMMUNITY)
Admission: EM | Admit: 2017-06-19 | Discharge: 2017-06-19 | Disposition: A | Discharge status: Home / Self Care | Payer: Medicaid Other | Attending: Emergency Medicine | Admitting: Emergency Medicine

## 2017-06-19 DIAGNOSIS — M25562 Pain in left knee: Secondary | ICD-10-CM

## 2017-06-19 MED ORDER — NAPROXEN 500 MG PO TABS: 500.0000 mg | ORAL_TABLET | Freq: Two times a day (BID) | ORAL | 0 refills | Status: DC

## 2017-06-19 MED FILL — NAPROXEN 500 MG TABLET: 500 | 10 days supply | Qty: 20 | Fill #0

## 2017-06-19 NOTE — ED Notes (Signed)
Pt being transported to X-Ray.

## 2017-06-19 NOTE — ED Provider Notes (Signed)
[place] EMERGENCY DEPARTMENT Provider Note   CSN: [id number] Arrival date & time: 06/18/17  2057     History   Chief Complaint Chief Complaint  Patient presents with  . Extremity Weakness    HPI Carl Clark is a 67 y.o. male.  This patient is a 67 year old male with past medical history of diabetes, hypertension, coronary artery disease.  He presents today for evaluation of left knee pain.  He states that for the past 2 months he has been having episodes where his knee gives out and causes him to fall.  He states that his "knee is slow" and that it is "no good".  He has difficulty elaborating otherwise.  He denies any specific injury or trauma.  He denies any fevers or chills.  He denies any back pain or bowel or bladder complaints.  The history is provided by the patient.  Extremity Weakness  This is a new problem. Episode onset: Several weeks ago. Episode frequency: Intermittently. The problem has been gradually worsening. The symptoms are aggravated by walking. Nothing relieves the symptoms. He has tried nothing for the symptoms.    Past Medical History:  Diagnosis Date  . CAD (coronary artery disease) 03/16/2017   S/p NSTEMI 6/18 >> LHC: multivessel CAD >> s/p CABG // Echo 6/18: Mild LVH, EF 60-65, normal wall motion, grade 1 diastolic dysfunction, normal RV SF, PASP 36 (borderline pulmonary hypertension) // Pre-CABG Dopplers 09/13/16: Bilateral ICA 1-39  . Family history of anesthesia complication    "never had anesthesia" (07/14/2012)  . History of non-ST elevation myocardial infarction (NSTEMI) 09/12/2016   Treated with CABG  . History of rectal bleeding    secondary to diverticulum affecting right hepatic flexure - 06/2012  . Hypertension   . Type II diabetes mellitus [facility])     Patient Active Problem List   Diagnosis Date Noted  . Uncontrolled type 2 diabetes mellitus with hyperglycemia ([place]) 06/15/2017  . Bilateral knee pain  06/12/2017  . CAD (coronary artery disease) 03/16/2017  . Hyperlipidemia 03/16/2017  . 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 History of non-ST elevation myocardial infarction (NSTEMI) 09/12/2016  . Rectal bleeding 07/13/2012  . Diabetes mellitus ([place]) 07/13/2012  . Essential hypertension 07/13/2012    Past Surgical History:  Procedure Laterality Date  . COLONOSCOPY Left 07/14/2012   Procedure: COLONOSCOPY;  Surgeon: Arta Silence, MD;  Location: [facility] ENDOSCOPY;  Service: Endoscopy;  Laterality: Left;  . CORONARY ARTERY BYPASS GRAFT N/A 09/15/2016   Procedure: CORONARY ARTERY BYPASS GRAFTING (CABG) x 3, LIMA to LAD, SVG to DIAGONAL, SVG to OM2, USING LEFT MAMMARY ARTERY AND RIGHT GREATER SAPHENOUS VEIN HARVESTED ENDOSCOPICALLY;  Surgeon: Grace Isaac, MD;  Location: [place];  Service: Open Heart Surgery;  Laterality: N/A;  . ESOPHAGOGASTRODUODENOSCOPY N/A 07/13/2012   Procedure: ESOPHAGOGASTRODUODENOSCOPY (EGD);  Surgeon: Arta Silence, MD;  Location[facility] ENDOSCOPY;  Service: Endoscopy;  Laterality: N/A;  pt in ED A8  . LEFT HEART CATH AND CORONARY ANGIOGRAPHY N/A 09/12/2016   Procedure: Left Heart Cath and Coronary Angiography;  Surgeon: Sherren Mocha, MD;  Location: [place] CV LAB;  Service: Cardiovascular;  Laterality: N/A;  . NO PAST SURGERIES    . TEE WITHOUT CARDIOVERSION N/A 09/15/2016   Procedure: TRANSESOPHAGEAL ECHOCARDIOGRAM (TEE);  Surgeon: Grace Isaac, MD;  Location: [place];  Service: Open Heart Surgery;  Laterality: N/A;        Home Medications    Prior to Admission medications   Medication Sig Start  Date End Date Taking? Authorizing Provider  ACCU-CHEK SOFTCLIX LANCETS lancets Use as instructed to check blood sugar 3 times weekly before breakfast. E11.9 06/12/17   Gildardo Pounds, NP  aspirin 81 MG tablet Take 1 tablet (81 mg total) by mouth daily. 04/19/17   Gildardo Pounds, NP  atorvastatin (LIPITOR) 80 MG tablet Take 1 tablet (80 mg total) by mouth daily at 6 PM.  04/19/17   Gildardo Pounds, NP  Blood Glucose Monitoring Suppl (TRUE METRIX METER) w/Device KIT Use as instructed 04/19/17   Gildardo Pounds, NP  diclofenac sodium (VOLTAREN) 1 % GEL Apply 2 g topically 4 (four) times daily. Patient not taking: Reported on 06/12/2017 04/19/17   Gildardo Pounds, NP  glipiZIDE (GLUCOTROL) 10 MG tablet Take 1 tablet (10 mg total) by mouth 2 (two) times daily before a meal. 06/12/17   Gildardo Pounds, NP  glucose blood (ACCU-CHEK AVIVA PLUS) test strip Use as instructed to check blood sugar 3 times weekly before breakfast. E11.9 06/12/17   Gildardo Pounds, NP  Insulin Detemir (LEVEMIR) 100 UNIT/ML Pen Inject 20 Units into the skin daily at 10 pm. 06/12/17   Gildardo Pounds, NP  Insulin Pen Needle (B-D UF III MINI PEN NEEDLES) 31G X 5 MM MISC Use as instructed 06/12/17   Gildardo Pounds, NP  losartan (COZAAR) 25 MG tablet Take 1 tablet (25 mg total) by mouth daily. 06/12/17 06/07/18  Gildardo Pounds, NP  meloxicam (MOBIC) 7.5 MG tablet Take 1 tablet (7.5 mg total) by mouth daily. 06/12/17   Gildardo Pounds, NP  metFORMIN (GLUCOPHAGE) 1000 MG tablet Take 1 tablet (1,000 mg total) by mouth 2 (two) times daily with a meal. 06/12/17   Gildardo Pounds, NP  metoprolol tartrate (LOPRESSOR) 25 MG tablet Take 1 tablet (25 mg total) by mouth 2 (two) times daily. 06/12/17 06/07/18  Gildardo Pounds, NP  valACYclovir (VALTREX) 1000 MG tablet Take 2 tablets (2,000 mg total) by mouth 2 (two) times daily for 1 day. 06/18/17 06/19/17  Gildardo Pounds, NP  cetirizine (ZYRTEC) 10 MG tablet Take 1 tablet (10 mg total) by mouth daily. Patient not taking: Reported on 05/31/2014 09/08/13 09/21/15  Wardell Honour, MD  ferrous sulfate 325 (65 FE) MG tablet Take 325 mg by mouth daily with breakfast.  05/31/14  [provider]    Family History Family History  Problem Relation Age of Onset  . Diabetes Mother   . Diabetes Father     Social History Social History   Tobacco Use  . Smoking  status: Never Smoker  . Smokeless tobacco: Never Used  Substance Use Topics  . Alcohol use: No  . Drug use: No     Allergies   Patient has no active allergies.   Review of Systems Review of Systems  Musculoskeletal: Positive for extremity weakness.  All other systems reviewed and are negative.    Physical Exam Updated Vital Signs BP (!) 149/73 (BP Location: Right Arm)   Pulse 64   Temp 97.6 F (36.4 C)   Resp 18   Ht '5\' 5"'  (1.651 m)   Wt 68 kg (150 lb)   SpO2 100%   BMI 24.96 kg/m   Physical Exam  Constitutional: He is oriented to person, place, and time. He appears well-developed and well-nourished. No distress.  HENT:  Head: Normocephalic and atraumatic.  Mouth/Throat: Oropharynx is clear and moist.  Neck: Normal range of motion. Neck supple.  Cardiovascular:  Normal rate and regular rhythm. Exam reveals no friction rub.  No murmur heard. Pulmonary/Chest: Effort normal and breath sounds normal. No respiratory distress. He has no wheezes. He has no rales.  Abdominal: Soft. Bowel sounds are normal. He exhibits no distension. There is no tenderness.  Musculoskeletal: Normal range of motion. He exhibits no edema.  There is no lumbar spine tenderness.  Left knee appears grossly normal and symmetrical with the right.  There is no effusion.  He has good range of motion with no limitation.  I feel no crepitus.  The knee is stable to anterior and posterior drawer test.  There is no laxity with varus or valgus stress.  Distal PMS is intact.  Neurological: He is alert and oriented to person, place, and time. Coordination normal.  Skin: Skin is warm and dry. He is not diaphoretic.  Nursing note and vitals reviewed.    ED Treatments / Results  Labs (all labs ordered are listed, but only abnormal results are displayed) Labs Reviewed  BASIC METABOLIC PANEL - Abnormal; Notable for the following components:      Result Value   Glucose, Bld 132 (*)    Calcium 8.6 (*)    All  other components within normal limits  CBC - Abnormal; Notable for the following components:   RBC 3.62 (*)    Hemoglobin 11.2 (*)    HCT 34.3 (*)    All other components within normal limits  CBG MONITORING, ED - Abnormal; Notable for the following components:   Glucose-Capillary 141 (*)    All other components within normal limits  URINALYSIS, ROUTINE W REFLEX MICROSCOPIC    EKG None  Radiology No results found.  Procedures Procedures (including critical care time)  Medications Ordered in ED Medications - No data to display   Initial Impression / Assessment and Plan / ED Course  I have reviewed the triage vital signs and the nursing notes.  Pertinent labs & imaging results that were available during my care of the patient were reviewed by me and considered in my medical decision making (see chart for details).  Patient presents with complaints of left knee pain.  He states that it "gives out on him" on occasion.  His physical examination is unremarkable.  There is no instability on exam.  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  I believe these x-ray findings may well explain the nature of his symptoms.  I will have him follow-up with orthopedics.  He will be started on an anti-inflammatory until this follow-up occurs.  Final Clinical Impressions(s) / ED Diagnoses   Final diagnoses:  None    ED Discharge Orders    None       Veryl Speak, MD 06/19/17 [phone]

## 2017-06-19 NOTE — Discharge Instructions (Signed)
Naproxen as prescribed.  Follow-up with orthopedic surgery in the next week.  The contact information for Dr. Jena GaussHaddix has been provided in this discharge summary for you to call and make these arrangements.

## 2017-06-22 MED FILL — LEVEMIR FLEXTOUCH 100 UNITS: 100 | 30 days supply | Qty: 6 | Fill #0

## 2017-07-18 ENCOUNTER — Ambulatory Visit: Payer: Medicaid Other | Attending: Nurse Practitioner | Admitting: Nurse Practitioner

## 2017-07-18 ENCOUNTER — Encounter: Payer: Self-pay | Admitting: Nurse Practitioner

## 2017-07-18 ENCOUNTER — Other Ambulatory Visit: Payer: Self-pay | Admitting: Student

## 2017-07-18 VITALS — BP 157/82 | HR 57 | Temp 97.9°F | Ht 65.0 in | Wt 148.0 lb

## 2017-07-18 DIAGNOSIS — Z833 Family history of diabetes mellitus: Secondary | ICD-10-CM | POA: Diagnosis not present

## 2017-07-18 DIAGNOSIS — E118 Type 2 diabetes mellitus with unspecified complications: Secondary | ICD-10-CM | POA: Diagnosis not present

## 2017-07-18 DIAGNOSIS — Z09 Encounter for follow-up examination after completed treatment for conditions other than malignant neoplasm: Secondary | ICD-10-CM | POA: Diagnosis present

## 2017-07-18 DIAGNOSIS — Z9889 Other specified postprocedural states: Secondary | ICD-10-CM | POA: Insufficient documentation

## 2017-07-18 DIAGNOSIS — Z951 Presence of aortocoronary bypass graft: Secondary | ICD-10-CM | POA: Insufficient documentation

## 2017-07-18 DIAGNOSIS — Z79899 Other long term (current) drug therapy: Secondary | ICD-10-CM | POA: Insufficient documentation

## 2017-07-18 DIAGNOSIS — I2510 Atherosclerotic heart disease of native coronary artery without angina pectoris: Secondary | ICD-10-CM | POA: Insufficient documentation

## 2017-07-18 DIAGNOSIS — R531 Weakness: Secondary | ICD-10-CM | POA: Diagnosis not present

## 2017-07-18 DIAGNOSIS — I10 Essential (primary) hypertension: Secondary | ICD-10-CM | POA: Diagnosis not present

## 2017-07-18 DIAGNOSIS — M25562 Pain in left knee: Secondary | ICD-10-CM

## 2017-07-18 DIAGNOSIS — I252 Old myocardial infarction: Secondary | ICD-10-CM | POA: Diagnosis not present

## 2017-07-18 DIAGNOSIS — I272 Pulmonary hypertension, unspecified: Secondary | ICD-10-CM | POA: Diagnosis not present

## 2017-07-18 DIAGNOSIS — Z794 Long term (current) use of insulin: Secondary | ICD-10-CM | POA: Insufficient documentation

## 2017-07-18 DIAGNOSIS — Z7982 Long term (current) use of aspirin: Secondary | ICD-10-CM | POA: Diagnosis not present

## 2017-07-18 DIAGNOSIS — Z791 Long term (current) use of non-steroidal anti-inflammatories (NSAID): Secondary | ICD-10-CM | POA: Diagnosis not present

## 2017-07-18 DIAGNOSIS — M5416 Radiculopathy, lumbar region: Secondary | ICD-10-CM

## 2017-07-18 LAB — GLUCOSE, POCT (MANUAL RESULT ENTRY): POC Glucose: 81 mg/dl (ref 70–99)

## 2017-07-18 LAB — POCT GLYCOSYLATED HEMOGLOBIN (HGB A1C): Hemoglobin A1C: 7.9

## 2017-07-18 MED ORDER — NAPROXEN 500 MG PO TABS: 500.0000 mg | ORAL_TABLET | Freq: Two times a day (BID) | ORAL | 0 refills | Status: DC

## 2017-07-18 MED ORDER — GLUCOSE BLOOD VI STRP: ORAL_STRIP | 12 refills | Status: DC

## 2017-07-18 MED ORDER — GLIPIZIDE 10 MG PO TABS: 10.0000 mg | ORAL_TABLET | Freq: Two times a day (BID) | ORAL | 3 refills | Status: DC

## 2017-07-18 MED ORDER — LOSARTAN POTASSIUM 50 MG PO TABS: 50.0000 mg | ORAL_TABLET | Freq: Every day | ORAL | 0 refills | Status: DC

## 2017-07-18 MED FILL — NAPROXEN 500 MG TABLET: 500 | 10 days supply | Qty: 20 | Fill #0

## 2017-07-18 MED FILL — LOSARTAN POTASSIUM 50 MG TA: 50 | 90 days supply | Qty: 90 | Fill #0

## 2017-07-18 NOTE — Progress Notes (Signed)
Assessment & Plan:  Cristal was seen today for follow-up and knee pain.  Diagnoses and all orders for this visit:  Type 2 diabetes mellitus with complication, unspecified whether long term insulin use (HCC) -     Glucose (CBG) -     HgB A1c -     glipiZIDE (GLUCOTROL) 10 MG tablet; Take 1 tablet (10 mg total) by mouth 2 (two) times daily before a meal. -     glucose blood (ACCU-CHEK AVIVA PLUS) test strip; Use as instructed to check blood sugar 3 times weekly before breakfast. E11.9  Essential hypertension -     losartan (COZAAR) 50 MG tablet; Take 1 tablet (50 mg total) by mouth daily.  Acute pain of left knee -     naproxen (NAPROSYN) 500 MG tablet; Take 1 tablet (500 mg total) by mouth 2 (two) times daily with a meal.    Patient has been counseled on age-appropriate routine health concerns for screening and prevention. These are reviewed and up-to-date. Referrals have been placed accordingly. Immunizations are up-to-date or declined.    Subjective:   Chief Complaint  Patient presents with  . Follow-up    Pt. is here for a follow-up on his Diabetes and knee pain.   . Knee Pain    Pt. is here pain with his whole left side leg due to fall at work 2 weeks ago.    HPI Carl Clark 67 y.o. male presents to office today for follow up. He was seen in the ED on 06-19-2017 with left knee pain and weakness with "giving out" sensation.  Xray of knee showed popliteal fossa calcification compatible with loose body. He was instructed to follow up with orthopedics and given anti inflammatory as well (naproxen) however I had previously placed him on meloxicam 7.7m daily from a previous office visit with the same concern. He is currently taking Naproxen 5048mBID and meloxicam.  I have instructed him that he can not take both and can continue the naproxen. He is currently seeing Orthopedic surgery and is in the process of having an MRI performed due to left quadricep weakness. He is  currently using a cane due to left knee weakness and giving out. I have instructed him to also try a knee brace.   Diabetes Mellitus Type 2 Chronic. MUCH IMPROVED. Checking blood sugars BID. AM readings: 140-160; Post prandial 180s.  We discussed dietary and exercise modifications. He endorses medication compliance. Taking metformin 100072mID, glipizide 20m60mD and levemir 20units in the evening. He is due for eye exam. Denies hypo or hyperglycemic symptoms.  Lab Results  Component Value Date   HGBA1C 7.9 07/18/2017   Essential Hypertension Chronic. Poorly controlled today. Endorses medication compliance. Will increase losartan to 25mg11m50mg 44my. Denies chest pain, shortness of breath, palpitations, lightheadedness, dizziness, headaches or BLE edema.  BP Readings from Last 3 Encounters:  07/18/17 (!) 157/82  06/18/17 (!) 149/73  06/12/17 (!) 154/80     Review of Systems  Constitutional: Negative for fever, malaise/fatigue and weight loss.  HENT: Negative.  Negative for nosebleeds.   Eyes: Negative.  Negative for blurred vision, double vision and photophobia.  Respiratory: Negative.  Negative for cough and shortness of breath.   Cardiovascular: Negative.  Negative for chest pain, palpitations and leg swelling.  Gastrointestinal: Negative.  Negative for heartburn, nausea and vomiting.  Musculoskeletal: Positive for falls, joint pain and myalgias.  Neurological: Negative.  Negative for dizziness, focal weakness, seizures and headaches.  Psychiatric/Behavioral: Negative.  Negative for suicidal ideas.    Past Medical History:  Diagnosis Date  . CAD (coronary artery disease) 03/16/2017   S/p NSTEMI 6/18 >> LHC: multivessel CAD >> s/p CABG // Echo 6/18: Mild LVH, EF 60-65, normal wall motion, grade 1 diastolic dysfunction, normal RV SF, PASP 36 (borderline pulmonary hypertension) // Pre-CABG Dopplers 09/13/16: Bilateral ICA 1-39  . Family history of anesthesia complication    "never  had anesthesia" (07/14/2012)  . History of non-ST elevation myocardial infarction (NSTEMI) 09/12/2016   Treated with CABG  . History of rectal bleeding    secondary to diverticulum affecting right hepatic flexure - 06/2012  . Hypertension   . Type II diabetes mellitus ([place])     Past Surgical History:  Procedure Laterality Date  . COLONOSCOPY Left 07/14/2012   Procedure: COLONOSCOPY;  Surgeon: Arta Silence, MD;  Location: [facility] ENDOSCOPY;  Service: Endoscopy;  Laterality: Left;  . CORONARY ARTERY BYPASS GRAFT N/A 09/15/2016   Procedure: CORONARY ARTERY BYPASS GRAFTING (CABG) x 3, LIMA to LAD, SVG to DIAGONAL, SVG to OM2, USING LEFT MAMMARY ARTERY AND RIGHT GREATER SAPHENOUS VEIN HARVESTED ENDOSCOPICALLY;  Surgeon: Grace Isaac, MD;  Location: [place];  Service: Open Heart Surgery;  Laterality: N/A;  . ESOPHAGOGASTRODUODENOSCOPY N/A 07/13/2012   Procedure: ESOPHAGOGASTRODUODENOSCOPY (EGD);  Surgeon: Arta Silence, MD;  Location: [facility] ENDOSCOPY;  Service: Endoscopy;  Laterality: N/A;  pt in ED A8  . LEFT HEART CATH AND CORONARY ANGIOGRAPHY N/A 09/12/2016   Procedure: Left Heart Cath and Coronary Angiography;  Surgeon: Sherren Mocha, MD;  Location: [place] CV LAB;  Service: Cardiovascular;  Laterality: N/A;  . NO PAST SURGERIES    . TEE WITHOUT CARDIOVERSION N/A 09/15/2016   Procedure: TRANSESOPHAGEAL ECHOCARDIOGRAM (TEE);  Surgeon: Grace Isaac, MD;  Location: [place];  Service: Open Heart Surgery;  Laterality: N/A;    Family History  Problem Relation Age of Onset  . Diabetes Mother   . Diabetes Father     Social History Reviewed with no changes to be made today.   Outpatient Medications Prior to Visit  Medication Sig Dispense Refill  . ACCU-CHEK SOFTCLIX LANCETS lancets Use as instructed to check blood sugar 3 times weekly before breakfast. E11.9 100 each 12  . aspirin 81 MG tablet Take 1 tablet (81 mg total) by mouth daily. 30 tablet 12  . atorvastatin (LIPITOR) 80 MG tablet  Take 1 tablet (80 mg total) by mouth daily at 6 PM. 90 tablet 1  . Blood Glucose Monitoring Suppl (TRUE METRIX METER) w/Device KIT Use as instructed 1 kit 0  . Insulin Detemir (LEVEMIR) 100 UNIT/ML Pen Inject 20 Units into the skin daily at 10 pm. 15 mL 6  . Insulin Pen Needle (B-D UF III MINI PEN NEEDLES) 31G X 5 MM MISC Use as instructed 90 each 1  . metFORMIN (GLUCOPHAGE) 1000 MG tablet Take 1 tablet (1,000 mg total) by mouth 2 (two) times daily with a meal. 60 tablet 2  . metoprolol tartrate (LOPRESSOR) 25 MG tablet Take 1 tablet (25 mg total) by mouth 2 (two) times daily. 180 tablet 3  . glipiZIDE (GLUCOTROL) 10 MG tablet Take 1 tablet (10 mg total) by mouth 2 (two) times daily before a meal. 60 tablet 3  . glucose blood (ACCU-CHEK AVIVA PLUS) test strip Use as instructed to check blood sugar 3 times weekly before breakfast. E11.9 100 each 12  . losartan (COZAAR) 25 MG tablet Take 1 tablet (25 mg total) by mouth  daily. 90 tablet 3  . meloxicam (MOBIC) 7.5 MG tablet Take 1 tablet (7.5 mg total) by mouth daily. 30 tablet 0  . naproxen (NAPROSYN) 500 MG tablet Take 1 tablet (500 mg total) by mouth 2 (two) times daily with a meal. 20 tablet 0  . diclofenac sodium (VOLTAREN) 1 % GEL Apply 2 g topically 4 (four) times daily. (Patient not taking: Reported on 06/12/2017) 100 g 1   No facility-administered medications prior to visit.     No Known Allergies     Objective:    BP (!) 157/82 (BP Location: Left Arm, Patient Position: Sitting, Cuff Size: Normal)   Pulse (!) 57   Temp 97.9 F (36.6 C) (Oral)   Ht '5\' 5"'  (1.651 m)   Wt 148 lb (67.1 kg)   SpO2 98%   BMI 24.63 kg/m  Wt Readings from Last 3 Encounters:  07/18/17 148 lb (67.1 kg)  06/18/17 150 lb (68 kg)  06/12/17 150 lb 6.4 oz (68.2 kg)    Physical Exam  Constitutional: He is oriented to person, place, and time. He appears well-developed and well-nourished. He is cooperative.  HENT:  Head: Normocephalic and atraumatic.  Eyes:  EOM are normal.  Neck: Normal range of motion.  Cardiovascular: Normal rate, regular rhythm, normal heart sounds and intact distal pulses. Exam reveals no gallop and no friction rub.  No murmur heard. Pulmonary/Chest: Effort normal and breath sounds normal. No tachypnea. No respiratory distress. He has no decreased breath sounds. He has no wheezes. He has no rhonchi. He has no rales. He exhibits no tenderness.  Abdominal: Soft. Bowel sounds are normal.  Neurological: He is alert and oriented to person, place, and time. Coordination normal.  Skin: Skin is warm and dry.  Psychiatric: He has a normal mood and affect. His behavior is normal. Judgment and thought content normal.  Nursing note and vitals reviewed.      Patient has been counseled extensively about nutrition and exercise as well as the importance of adherence with medications and regular follow-up. The patient was given clear instructions to go to ER or return to medical center if symptoms don't improve, worsen or new problems develop. The patient verbalized understanding.   Follow-up: Return in about 3 weeks (around 08/08/2017) for BP recheck; schedule with me.   Gildardo Pounds, FNP-BC [facility] [place], [place]   07/18/2017, 9:12 AM

## 2017-07-18 NOTE — Patient Instructions (Signed)

## 2017-07-28 ENCOUNTER — Other Ambulatory Visit: Payer: Medicaid Other

## 2017-07-28 ENCOUNTER — Ambulatory Visit
Admission: RE | Admit: 2017-07-28 | Discharge: 2017-07-28 | Disposition: A | Discharge status: Home / Self Care | Payer: Medicaid Other | Source: Ambulatory Visit | Attending: Student | Admitting: Student

## 2017-07-28 DIAGNOSIS — M5416 Radiculopathy, lumbar region: Secondary | ICD-10-CM

## 2017-08-10 ENCOUNTER — Ambulatory Visit (INDEPENDENT_AMBULATORY_CARE_PROVIDER_SITE_OTHER): Payer: Medicaid Other | Admitting: Nurse Practitioner

## 2017-08-10 ENCOUNTER — Other Ambulatory Visit: Payer: Self-pay

## 2017-08-10 ENCOUNTER — Encounter (INDEPENDENT_AMBULATORY_CARE_PROVIDER_SITE_OTHER): Payer: Self-pay | Admitting: Nurse Practitioner

## 2017-08-10 VITALS — BP 151/76 | HR 63 | Temp 97.6°F | Ht 65.0 in | Wt 148.6 lb

## 2017-08-10 DIAGNOSIS — E782 Mixed hyperlipidemia: Secondary | ICD-10-CM

## 2017-08-10 DIAGNOSIS — E118 Type 2 diabetes mellitus with unspecified complications: Secondary | ICD-10-CM | POA: Diagnosis not present

## 2017-08-10 DIAGNOSIS — M25562 Pain in left knee: Secondary | ICD-10-CM

## 2017-08-10 DIAGNOSIS — L308 Other specified dermatitis: Secondary | ICD-10-CM | POA: Diagnosis not present

## 2017-08-10 DIAGNOSIS — G8929 Other chronic pain: Secondary | ICD-10-CM

## 2017-08-10 DIAGNOSIS — I10 Essential (primary) hypertension: Secondary | ICD-10-CM

## 2017-08-10 MED ORDER — TRIAMCINOLONE ACETONIDE 0.1 % EX CREA: 1.0000 "application " | TOPICAL_CREAM | Freq: Two times a day (BID) | CUTANEOUS | 0 refills | Status: DC

## 2017-08-10 MED ORDER — LOSARTAN POTASSIUM 100 MG PO TABS: 100.0000 mg | ORAL_TABLET | Freq: Every day | ORAL | 0 refills | Status: DC

## 2017-08-10 MED ORDER — MISC. DEVICES MISC: 0 refills | Status: AC

## 2017-08-10 MED ORDER — METFORMIN HCL 1000 MG PO TABS
1000.0000 mg | ORAL_TABLET | Freq: Two times a day (BID) | ORAL | 2 refills | Status: DC
Start: 2017-08-10 — End: 2017-12-26

## 2017-08-10 MED FILL — TRIAMCINOLONE ACETONIDE 0.1: 0.1 | 15 days supply | Qty: 30 | Fill #0

## 2017-08-10 MED FILL — LOSARTAN POTASSIUM 100 MG T: 100 | 30 days supply | Qty: 30 | Fill #0

## 2017-08-10 MED FILL — metFORMIN HCL 1000 MG TABS: 1000 | 30 days supply | Qty: 60 | Fill #0

## 2017-08-10 NOTE — Progress Notes (Signed)
Assessment & Plan:  Carl Clark was seen today for follow-up.  Diagnoses and all orders for this visit:  Essential hypertension -     losartan (COZAAR) 100 MG tablet; Take 1 tablet (100 mg total) by mouth daily. Continue all antihypertensives as prescribed.  Remember to bring in your blood pressure log with you for your follow up appointment.  DASH/Mediterranean Diets are healthier choices for HTN.    Type 2 diabetes mellitus with complication, unspecified whether long term insulin use (HCC) -     metFORMIN (GLUCOPHAGE) 1000 MG tablet; Take 1 tablet (1,000 mg total) by mouth 2 (two) times daily with a meal. Continue blood sugar control as discussed in office today, low carbohydrate diet, and regular physical exercise as tolerated, 150 minutes per week (30 min each day, 5 days per week, or 50 min 3 days per week). Keep blood sugar logs with fasting goal of 80-130 mg/dl, post prandial less than 180.  For Hypoglycemia: BS <60 and Hyperglycemia BS >400; contact the clinic ASAP. Annual eye exams and foot exams are recommended.   Mixed hyperlipidemia INSTRUCTIONS: Work on a low fat, heart healthy diet and participate in regular aerobic exercise program by working out at least 150 minutes per week. No fried foods. No junk foods, sodas, sugary drinks, unhealthy snacking, alcohol or smoking.     Other eczema -     triamcinolone cream (KENALOG) 0.1 %; Apply 1 application topically 2 (two) times daily.  Chronic pain of left knee -     Misc. Devices MISC; Please provide patient with an insurance approved QUAD CANE   Patient has been counseled on age-appropriate routine health concerns for screening and prevention. These are reviewed and up-to-date. Referrals have been placed accordingly. Immunizations are up-to-date or declined.    Subjective:   Chief Complaint  Patient presents with  . Follow-up    BP recheck    HPI Carl Clark 67 y.o. male presents to office today for BP  recheck.   Essential Hypertension Chronic. Not well controlled. He endorses medication compliance taking Losartan 50 mg and metoprolol 45m BID. Will increase losartan to 1027m BP is not at goal. Denies chest pain, shortness of breath, palpitations, lightheadedness, dizziness, headaches or BLE edema.  BP Readings from Last 3 Encounters:  08/10/17 (!) 151/76  07/18/17 (!) 157/82  06/18/17 (!) 149/73   Left knee Pain Chronic and ongoing. 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 3. Popliteal fossa calcification most compatible with loose body. Currently seeing orthopedics. Will order Quad cane today as he endorses weakness and history of falls.   Diabetes Mellitus Type 2 Chronic. Stable. Endorses medication compliance taking metformin 100076mID, levemir 20units daily, glipizide 21m27mD . Denies any hypo or hyperglycemic symptoms.  Lab Results  Component Value Date   HGBA1C 7.9 07/18/2017   Hyperlipidemia Patient presents for follow up to hyperlipidemia.  He is medication compliant taking Lipitor 80mg54mly. He is diet compliant and denies skin xanthelasma or statin intolerance including myalgias.  Lab Results  Component Value Date   CHOL 87 (L) 03/17/2017   Lab Results  Component Value Date   HDL 30 (L) 03/17/2017   Lab Results  Component Value Date   LDLCALC 49 03/17/2017   Lab Results  Component Value Date   TRIG 39 03/17/2017   Lab Results  Component Value Date   CHOLHDL 2.9 03/17/2017    Review of Systems  Constitutional: Negative for fever, malaise/fatigue and weight loss.  HENT:  Negative.  Negative for nosebleeds.   Eyes: Negative.  Negative for blurred vision, double vision and photophobia.  Respiratory: Negative.  Negative for cough and shortness of breath.   Cardiovascular: Negative.  Negative for chest pain, palpitations and leg swelling.  Gastrointestinal: Negative.  Negative for heartburn, nausea and vomiting.    Musculoskeletal: Positive for falls and joint pain. Negative for myalgias.  Skin: Negative for rash.  Neurological: Negative.  Negative for dizziness, focal weakness, seizures and headaches.  Psychiatric/Behavioral: Negative.  Negative for suicidal ideas.    Past Medical History:  Diagnosis Date  . CAD (coronary artery disease) 03/16/2017   S/p NSTEMI 6/18 >> LHC: multivessel CAD >> s/p CABG // Echo 6/18: Mild LVH, EF 60-65, normal wall motion, grade 1 diastolic dysfunction, normal RV SF, PASP 36 (borderline pulmonary hypertension) // Pre-CABG Dopplers 09/13/16: Bilateral ICA 1-39  . Family history of anesthesia complication    "never had anesthesia" (07/14/2012)  . History of non-ST elevation myocardial infarction (NSTEMI) 09/12/2016   Treated with CABG  . History of rectal bleeding    secondary to diverticulum affecting right hepatic flexure - 06/2012  . Hypertension   . Type II diabetes mellitus ([place])     Past Surgical History:  Procedure Laterality Date  . COLONOSCOPY Left 07/14/2012   Procedure: COLONOSCOPY;  Surgeon: Arta Silence, MD;  Location[facility] ENDOSCOPY;  Service: Endoscopy;  Laterality: Left;  . CORONARY ARTERY BYPASS GRAFT N/A 09/15/2016   Procedure: CORONARY ARTERY BYPASS GRAFTING (CABG) x 3, LIMA to LAD, SVG to DIAGONAL, SVG to OM2, USING LEFT MAMMARY ARTERY AND RIGHT GREATER SAPHENOUS VEIN HARVESTED ENDOSCOPICALLY;  Surgeon: Grace Isaac, MD;  Location: [facility];  Service: Open Heart Surgery;  Laterality: N/A;  . ESOPHAGOGASTRODUODENOSCOPY N/A 07/13/2012   Procedure: ESOPHAGOGASTRODUODENOSCOPY (EGD);  Surgeon: Arta Silence, MD;  Location: [facility] ENDOSCOPY;  Service: Endoscopy;  Laterality: N/A;  pt in ED A8  . LEFT HEART CATH AND CORONARY ANGIOGRAPHY N/A 09/12/2016   Procedure: Left Heart Cath and Coronary Angiography;  Surgeon: Sherren Mocha, MD;  Location: [facility] CV LAB;  Service: Cardiovascular;  Laterality: N/A;  . NO PAST SURGERIES    . TEE WITHOUT  CARDIOVERSION N/A 09/15/2016   Procedure: TRANSESOPHAGEAL ECHOCARDIOGRAM (TEE);  Surgeon: Grace Isaac, MD;  Location: [facility];  Service: Open Heart Surgery;  Laterality: N/A;    Family History  Problem Relation Age of Onset  . Diabetes Mother   . Diabetes Father     Social History Reviewed with no changes to be made today.   Outpatient Medications Prior to Visit  Medication Sig Dispense Refill  . ACCU-CHEK SOFTCLIX LANCETS lancets Use as instructed to check blood sugar 3 times weekly before breakfast. E11.9 100 each 12  . aspirin 81 MG tablet Take 1 tablet (81 mg total) by mouth daily. 30 tablet 12  . atorvastatin (LIPITOR) 80 MG tablet Take 1 tablet (80 mg total) by mouth daily at 6 PM. 90 tablet 1  . Blood Glucose Monitoring Suppl (TRUE METRIX METER) w/Device KIT Use as instructed 1 kit 0  . glipiZIDE (GLUCOTROL) 10 MG tablet Take 1 tablet (10 mg total) by mouth 2 (two) times daily before a meal. 60 tablet 3  . glucose blood (ACCU-CHEK AVIVA PLUS) test strip Use as instructed to check blood sugar 3 times weekly before breakfast. E11.9 100 each 12  . Insulin Detemir (LEVEMIR) 100 UNIT/ML Pen Inject 20 Units into the skin daily at 10 pm. 15 mL 6  . Insulin  Pen Needle (B-D UF III MINI PEN NEEDLES) 31G X 5 MM MISC Use as instructed 90 each 1  . metoprolol tartrate (LOPRESSOR) 25 MG tablet Take 1 tablet (25 mg total) by mouth 2 (two) times daily. 180 tablet 3  . naproxen (NAPROSYN) 500 MG tablet Take 1 tablet (500 mg total) by mouth 2 (two) times daily with a meal. 20 tablet 0  . losartan (COZAAR) 50 MG tablet Take 1 tablet (50 mg total) by mouth daily. 90 tablet 0  . metFORMIN (GLUCOPHAGE) 1000 MG tablet Take 1 tablet (1,000 mg total) by mouth 2 (two) times daily with a meal. 60 tablet 2  . diclofenac sodium (VOLTAREN) 1 % GEL Apply 2 g topically 4 (four) times daily. (Patient not taking: Reported on 06/12/2017) 100 g 1   No facility-administered medications prior to visit.     No  Known Allergies     Objective:    BP (!) 151/76 (BP Location: Left Arm, Patient Position: Sitting, Cuff Size: Normal)   Pulse 63   Temp 97.6 F (36.4 C) (Oral)   Ht _0  (1.651 m)   Wt 148 lb 9.6 oz (67.4 kg)   SpO2 95%   BMI 24.73 kg/m  Wt Readings from Last 3 Encounters:  08/10/17 148 lb 9.6 oz (67.4 kg)  07/18/17 148 lb (67.1 kg)  06/18/17 150 lb (68 kg)    Physical Exam  Constitutional: He is oriented to person, place, and time. He appears well-developed and well-nourished. He is cooperative.  HENT:  Head: Normocephalic and atraumatic.    Eyes: EOM are normal.  Neck: Normal range of motion.  Cardiovascular: Normal rate, regular rhythm and normal heart sounds. Exam reveals no gallop and no friction rub.  No murmur heard. Pulmonary/Chest: Effort normal and breath sounds normal. No tachypnea. No respiratory distress. He has no decreased breath sounds. He has no wheezes. He has no rhonchi. He has no rales. He exhibits no tenderness.  Abdominal: Soft. Bowel sounds are normal.  Musculoskeletal: He exhibits no edema or deformity.  Neurological: He is alert and oriented to person, place, and time. Coordination normal.  Skin: Skin is warm and dry. Rash noted. Rash is macular.  Psychiatric: He has a normal mood and affect. His behavior is normal. Judgment and thought content normal.  Nursing note and vitals reviewed.      Patient has been counseled extensively about nutrition and exercise as well as the importance of adherence with medications and regular follow-up. The patient was given clear instructions to go to ER or return to medical center if symptoms don't improve, worsen or new problems develop. The patient verbalized understanding.   Follow-up: Return in about 3 weeks (around 08/31/2017) for BP recheck.   Gildardo Pounds, FNP-BC [facility] [place], [place]   08/14/2017, 8:48 PM

## 2017-08-10 NOTE — Patient Instructions (Addendum)
[facility]  [address]  [address]  [place], [place] [address]  [phone]  Dr. Georgina Pillion [facility]  Medical supply store in [place]   Address: [address], [place], [place] [address]  Hours:  Open ? Closes 5:30PM  Phone: [phone]     [facility] Address: [address] [place], [place], [place] [address]  Hours:  Open ? Closes 5PM  Phone: [phone]

## 2017-08-14 ENCOUNTER — Encounter (INDEPENDENT_AMBULATORY_CARE_PROVIDER_SITE_OTHER): Payer: Self-pay | Admitting: Nurse Practitioner

## 2017-08-18 DIAGNOSIS — M545 Low back pain, unspecified: Secondary | ICD-10-CM | POA: Insufficient documentation

## 2017-08-18 DIAGNOSIS — I10 Essential (primary) hypertension: Secondary | ICD-10-CM | POA: Insufficient documentation

## 2017-09-12 ENCOUNTER — Encounter: Payer: Self-pay | Admitting: Nurse Practitioner

## 2017-09-12 ENCOUNTER — Ambulatory Visit: Payer: Medicare Other | Attending: Nurse Practitioner | Admitting: Nurse Practitioner

## 2017-09-12 ENCOUNTER — Encounter: Payer: Self-pay | Admitting: Physician Assistant

## 2017-09-12 ENCOUNTER — Ambulatory Visit: Payer: Medicaid Other | Admitting: Physician Assistant

## 2017-09-12 VITALS — BP 148/79 | HR 79 | Temp 98.5°F | Ht 65.0 in | Wt 145.8 lb

## 2017-09-12 VITALS — BP 130/70 | HR 69 | Ht 65.0 in | Wt 145.4 lb

## 2017-09-12 DIAGNOSIS — Z951 Presence of aortocoronary bypass graft: Secondary | ICD-10-CM | POA: Insufficient documentation

## 2017-09-12 DIAGNOSIS — E118 Type 2 diabetes mellitus with unspecified complications: Secondary | ICD-10-CM

## 2017-09-12 DIAGNOSIS — E782 Mixed hyperlipidemia: Secondary | ICD-10-CM

## 2017-09-12 DIAGNOSIS — Z79899 Other long term (current) drug therapy: Secondary | ICD-10-CM | POA: Insufficient documentation

## 2017-09-12 DIAGNOSIS — Z7982 Long term (current) use of aspirin: Secondary | ICD-10-CM | POA: Diagnosis not present

## 2017-09-12 DIAGNOSIS — Z794 Long term (current) use of insulin: Secondary | ICD-10-CM | POA: Diagnosis not present

## 2017-09-12 DIAGNOSIS — I10 Essential (primary) hypertension: Secondary | ICD-10-CM | POA: Insufficient documentation

## 2017-09-12 DIAGNOSIS — I2510 Atherosclerotic heart disease of native coronary artery without angina pectoris: Secondary | ICD-10-CM | POA: Diagnosis not present

## 2017-09-12 DIAGNOSIS — E119 Type 2 diabetes mellitus without complications: Secondary | ICD-10-CM | POA: Insufficient documentation

## 2017-09-12 DIAGNOSIS — I252 Old myocardial infarction: Secondary | ICD-10-CM | POA: Diagnosis not present

## 2017-09-12 DIAGNOSIS — N529 Male erectile dysfunction, unspecified: Secondary | ICD-10-CM | POA: Insufficient documentation

## 2017-09-12 LAB — GLUCOSE, POCT (MANUAL RESULT ENTRY): POC Glucose: 268 mg/dl — AB (ref 70–99)

## 2017-09-12 MED ORDER — VARDENAFIL HCL 20 MG PO TABS: 20.0000 mg | ORAL_TABLET | Freq: Every day | ORAL | 0 refills | Status: DC | PRN

## 2017-09-12 MED ORDER — SILDENAFIL CITRATE 25 MG PO TABS: 25.0000 mg | ORAL_TABLET | Freq: Every day | ORAL | 0 refills | Status: DC | PRN

## 2017-09-12 MED ORDER — VARDENAFIL HCL 10 MG PO TABS: 10.0000 mg | ORAL_TABLET | Freq: Every day | ORAL | 0 refills | Status: DC | PRN

## 2017-09-12 MED FILL — !VIAGRA 25 MG TABLET: 25 | 30 days supply | Qty: 10 | Fill #0

## 2017-09-12 NOTE — Patient Instructions (Addendum)
Medication Instructions:  No change.  See your medication list.  Labwork: You will need to come in for labs in 1 year (1 week before your next appointment). You will need to fast (nothing to eat or drink after midnight the night before the lab test).  You can eat after the blood is drawn. THIS WILL NEED TO BE DONE 1 WEEK BEFORE FOLLOW UP IN 1 YEAR  Testing/Procedures: None   Follow-Up: Your physician wants you to follow-up in: 1 YEAR FOLLOW UP WITH DR. Excell SeltzerOOPER OR Yuvia Plant, PAC. You will receive a reminder letter in the mail two months in advance. If you don't receive a letter, please call our office to schedule the follow-up appointment.   Any Other Special Instructions Will Be Listed Below (If Applicable).  If the pain in your chest gets worse, return for sooner follow up.  If you need a refill on your cardiac medications before your next appointment, please call your pharmacy.

## 2017-09-12 NOTE — Progress Notes (Signed)
Cardiology Office Note:    Date:  09/12/2017   ID:  Carl Clark, DOB 10-Feb-1951, MRN [id number]  PCP:  Gildardo Pounds, NP  Cardiologist:  Sherren Mocha, MD   Referring MD: Gildardo Pounds, NP   Chief Complaint  Patient presents with  . Follow-up    CAD    History of Present Illness:    Carl Clark is a 67 y.o. [place] male with diabetes, hypertension, CAD status post non-ST elevation myocardial infarction in June 2018 treated with multivessel CABG by Dr. Servando Snare (LIMA-LAD, SVG-diagonal, SVG-OM).  EF was normal.  He was last seen in 02/2017.    Carl Clark for follow-up.  He is seen today with the help of a video interpreter [phone]).  The patient is doing well without anginal symptoms, shortness of breath, PND, edema or syncope.  He does still continue to struggle with erectile dysfunction.  Of note, he fell at work a couple of months ago and has occasional discomfort in his chest.  This is nothing like his previous anginal symptoms.  He denies exertional symptoms.  Prior CV studies:   The following studies were reviewed today:  Intraoperative TEE 09/15/16 Moderate concentric LVH, EF 60-65, normal wall motion, trace TR  Echocardiogram 09/14/16 Mild LVH, EF 60-65, normal wall motion, grade 1 diastolic dysfunction, normal RV SF, PASP 36 (borderline pulmonary hypertension)  Cardiac catheterization 09/12/16 Three-vessel CAD >> referred for CABG   Pre-CABG Dopplers 09/13/16 Bilateral ICA 1-39   Past Medical History:  Diagnosis Date  . CAD (coronary artery disease) 03/16/2017   S/p NSTEMI 6/18 >> LHC: multivessel CAD >> s/p CABG // Echo 6/18: Mild LVH, EF 60-65, normal wall motion, grade 1 diastolic dysfunction, normal RV SF, PASP 36 (borderline pulmonary hypertension) // Pre-CABG Dopplers 09/13/16: Bilateral ICA 1-39  . Family history of anesthesia complication    "never had anesthesia" (07/14/2012)  . History of non-ST elevation  myocardial infarction (NSTEMI) 09/12/2016   Treated with CABG  . History of rectal bleeding    secondary to diverticulum affecting right hepatic flexure - 06/2012  . Hypertension   . Type II diabetes mellitus ([place])    Surgical Hx: The patient  has a past surgical history that includes No past surgeries; Esophagogastroduodenoscopy (N/A, 07/13/2012); Colonoscopy (Left, 07/14/2012); LEFT HEART CATH AND CORONARY ANGIOGRAPHY (N/A, 09/12/2016); Coronary artery bypass graft (N/A, 09/15/2016); and TEE without cardioversion (N/A, 09/15/2016).   Current Medications: Current Meds  Medication Sig  . ACCU-CHEK SOFTCLIX LANCETS lancets Use as instructed to check blood sugar 3 times weekly before breakfast. E11.9  . aspirin 81 MG tablet Take 1 tablet (81 mg total) by mouth daily.  Marland Kitchen atorvastatin (LIPITOR) 80 MG tablet Take 1 tablet (80 mg total) by mouth daily at 6 PM.  . Blood Glucose Monitoring Suppl (TRUE METRIX METER) w/Device KIT Use as instructed  . diclofenac sodium (VOLTAREN) 1 % GEL Apply 2 g topically 4 (four) times daily.  Marland Kitchen glipiZIDE (GLUCOTROL) 10 MG tablet Take 1 tablet (10 mg total) by mouth 2 (two) times daily before a meal.  . glucose blood (ACCU-CHEK AVIVA PLUS) test strip Use as instructed to check blood sugar 3 times weekly before breakfast. E11.9  . Insulin Detemir (LEVEMIR) 100 UNIT/ML Pen Inject 20 Units into the skin daily at 10 pm.  . Insulin Pen Needle (B-D UF III MINI PEN NEEDLES) 31G X 5 MM MISC Use as instructed  . losartan (COZAAR) 100 MG tablet Take 1 tablet (100 mg  total) by mouth daily.  . metFORMIN (GLUCOPHAGE) 1000 MG tablet Take 1 tablet (1,000 mg total) by mouth 2 (two) times daily with a meal.  . metoprolol tartrate (LOPRESSOR) 25 MG tablet Take 1 tablet (25 mg total) by mouth 2 (two) times daily.  . Misc. Devices MISC Please provide patient with an insurance approved QUAD CANE  . naproxen (NAPROSYN) 500 MG tablet Take 1 tablet (500 mg total) by mouth 2 (two) times daily  with a meal.  . triamcinolone cream (KENALOG) 0.1 % Apply 1 application topically 2 (two) times daily.     Allergies:   Patient has no known allergies.   Social History   Tobacco Use  . Smoking status: Never Smoker  . Smokeless tobacco: Never Used  Substance Use Topics  . Alcohol use: No  . Drug use: No     Family Hx: The patient's family history includes Diabetes in his father and mother.  ROS:   Please see the history of present illness.    Review of Systems  Cardiovascular: Positive for chest pain.   All other systems reviewed and are negative.   EKGs/Labs/Other Test Reviewed:    EKG:  EKG is not ordered today.    Recent Labs: 09/16/2016: Magnesium 1.8 04/12/2017: ALT 22 04/19/2017: TSH 1.790 06/18/2017: BUN 19; Creatinine, Ser 1.05; Hemoglobin 11.2; Platelets 255; Potassium 3.5; Sodium 139   Recent Lipid Panel Lab Results  Component Value Date/Time   CHOL 87 (L) 03/17/2017 11:21 AM   TRIG 39 03/17/2017 11:21 AM   HDL 30 (L) 03/17/2017 11:21 AM   CHOLHDL 2.9 03/17/2017 11:21 AM   CHOLHDL 4.8 09/12/2016 08:06 AM   LDLCALC 49 03/17/2017 11:21 AM    Physical Exam:    VS:  BP 130/70   Pulse 69   Ht '5\' 5"'  (1.651 m)   Wt 145 lb 6.4 oz (66 kg)   SpO2 98%   BMI 24.20 kg/m     Wt Readings from Last 3 Encounters:  09/12/17 145 lb 6.4 oz (66 kg)  08/10/17 148 lb 9.6 oz (67.4 kg)  07/18/17 148 lb (67.1 kg)     Physical Exam  Constitutional: He is oriented to person, place, and time. He appears well-developed and well-nourished. No distress.  HENT:  Head: Normocephalic and atraumatic.  Cardiovascular: Normal rate, regular rhythm and normal heart sounds.  No murmur heard. Pulmonary/Chest: Effort normal and breath sounds normal. He has no rales.  Median sternotomy wound without evidence of nonunion; there is no crepitus with rocking motion  Abdominal: Soft. There is no hepatomegaly.  Musculoskeletal: He exhibits no edema.  Neurological: He is alert and  oriented to person, place, and time.  Skin: Skin is warm and dry.    ASSESSMENT & PLAN:    Coronary artery disease involving native coronary artery of native heart without angina pectoris Status post myocardial infarction June 2018 followed by multivessel CABG.  Ejection fraction is well preserved.  He is doing well without anginal symptoms.  He does have some discomfort in his chest after a recent fall.  His median sternotomy wound seems to be well-healed without evidence of dehiscence.  I have asked him to contact us if he should have any worsening symptoms.  Continue aspirin, statin, ARB, beta-blocker.  Follow-up 1 year with labs prior to that visit.  Essential hypertension The patient's blood pressure is controlled on his current regimen.  Continue current therapy.   Mixed hyperlipidemia LDL optimal on most recent lab work.  Continue  current Rx.     Dispo:  Return in about 1 year (around 09/13/2018) for Routine Follow Up, w/ Dr. Burt Knack, or Richardson Dopp, PA-C.   Medication Adjustments/Labs and Tests Ordered: Current medicines are reviewed at length with the patient today.  Concerns regarding medicines are outlined above.  Tests Ordered: Orders Placed This Encounter  Procedures  . Comp Met (CMET)  . Lipid Profile   Medication Changes: No orders of the defined types were placed in this encounter.   Signed, Richardson Dopp, PA-C  09/12/2017 8:42 AM    [facility] [place], [place], [place]  [address] Phone: [phone]; Fax: [phone]

## 2017-09-12 NOTE — Progress Notes (Signed)
Assessment & Plan:  Carl Clark was seen today for follow-up.  Diagnoses and all orders for this visit:  Essential hypertension Continue all antihypertensives as prescribed.  Remember to bring in your blood pressure log with you for your follow up appointment.  DASH/Mediterranean Diets are healthier choices for HTN.    Type 2 diabetes mellitus with complication, unspecified whether long term insulin use (HCC) -     Glucose (CBG) Continue blood sugar control as discussed in office today, low carbohydrate diet, and regular physical exercise as tolerated, 150 minutes per week (30 min each day, 5 days per week, or 50 min 3 days per week). Keep blood sugar logs with fasting goal of 80-130 mg/dl, post prandial less than 180.  For Hypoglycemia: BS <60 and Hyperglycemia BS >400; contact the clinic ASAP. Annual eye exams and foot exams are recommended.   Vasculogenic erectile dysfunction, unspecified vasculogenic erectile dysfunction type -     sildenafil (VIAGRA) 25 MG tablet; Take 1 tablet (25 mg total) by mouth daily as needed for erectile dysfunction. Do not take with nitroglycerin. Take 30 min. before sex     Patient has been counseled on age-appropriate routine health concerns for screening and prevention. These are reviewed and up-to-date. Referrals have been placed accordingly. Immunizations are up-to-date or declined.    Subjective:   Chief Complaint  Patient presents with  . Follow-up    Pt. is here for blood pressure recheck.    HPI Carl Clark 67 y.o. male presents to office today for BP recheck. He has a history of DM, HTN and NSTEMI 08-2016 (S/P multivessel CABG). Most recent cardiology appointment was today 09-12-2017. His blood pressure was not well controlled at his last office visit with me on 08-10-2017. VRI was used to communicate directly with patient for the entire encounter including providing detailed patient instructions. He has back and knee pain.  Currently being seen by [facility] for his back. I have instructed him to speak with Dr. Rolena Infante regarding his most recent xray on his left knee.   Essential Hypertension Chronic. Blood pressure is well controlled on Losartan 138m and metoprolol tartrate 25mBID. He is diet compliant and endorses sodium restriction. Denies chest pain, shortness of breath, palpitations, lightheadedness, dizziness, headaches or BLE edema.  BP Readings from Last 3 Encounters:  09/12/17 130/70  08/10/17 (!) 151/76  07/18/17 (!) 157/82    Erectile Dysfunction Patient complains of erectile dysfunction.  Onset of dysfunction was several years ago and was gradual in onset.  Patient states the nature of difficulty is both attaining and maintaining erection. Full erections occur never. Partial erections occur never. Libido is not affected. Risk factors for ED include cardiovascular disease, diabetes mellitus and antihypertensive medications. Patient denies history of cranial, spinal, or pelvic trauma. Patient's expectations as to sexual function achieve and maintain an erection.  Patient's description of relationship w/partner ED is affecting his relationship.   Previous treatment of ED includes none.    Review of Systems  Constitutional: Negative for fever, malaise/fatigue and weight loss.  HENT: Negative.  Negative for nosebleeds.   Eyes: Negative.  Negative for blurred vision, double vision and photophobia.  Respiratory: Negative.  Negative for cough and shortness of breath.   Cardiovascular: Negative.  Negative for chest pain, palpitations and leg swelling.  Gastrointestinal: Negative.  Negative for heartburn, nausea and vomiting.  Genitourinary:       SEE HPI  Musculoskeletal: Negative.  Negative for myalgias.  Neurological: Negative.  Negative for  dizziness, focal weakness, seizures and headaches.  Psychiatric/Behavioral: Negative.  Negative for suicidal ideas.    Past Medical History:    Diagnosis Date  . CAD (coronary artery disease) 03/16/2017   S/p NSTEMI 6/18 >> LHC: multivessel CAD >> s/p CABG // Echo 6/18: Mild LVH, EF 60-65, normal wall motion, grade 1 diastolic dysfunction, normal RV SF, PASP 36 (borderline pulmonary hypertension) // Pre-CABG Dopplers 09/13/16: Bilateral ICA 1-39  . Family history of anesthesia complication    "never had anesthesia" (07/14/2012)  . History of non-ST elevation myocardial infarction (NSTEMI) 09/12/2016   Treated with CABG  . History of rectal bleeding    secondary to diverticulum affecting right hepatic flexure - 06/2012  . Hypertension   . Type II diabetes mellitus ([place])     Past Surgical History:  Procedure Laterality Date  . COLONOSCOPY Left 07/14/2012   Procedure: COLONOSCOPY;  Surgeon: Arta Silence, MD;  Location: [facility] ENDOSCOPY;  Service: Endoscopy;  Laterality: Left;  . CORONARY ARTERY BYPASS GRAFT N/A 09/15/2016   Procedure: CORONARY ARTERY BYPASS GRAFTING (CABG) x 3, LIMA to LAD, SVG to DIAGONAL, SVG to OM2, USING LEFT MAMMARY ARTERY AND RIGHT GREATER SAPHENOUS VEIN HARVESTED ENDOSCOPICALLY;  Surgeon: Grace Isaac, MD;  Location: [place];  Service: Open Heart Surgery;  Laterality: N/A;  . ESOPHAGOGASTRODUODENOSCOPY N/A 07/13/2012   Procedure: ESOPHAGOGASTRODUODENOSCOPY (EGD);  Surgeon: Arta Silence, MD;  Location[facility] ENDOSCOPY;  Service: Endoscopy;  Laterality: N/A;  pt in ED A8  . LEFT HEART CATH AND CORONARY ANGIOGRAPHY N/A 09/12/2016   Procedure: Left Heart Cath and Coronary Angiography;  Surgeon: Sherren Mocha, MD;  Location: [place] CV LAB;  Service: Cardiovascular;  Laterality: N/A;  . NO PAST SURGERIES    . TEE WITHOUT CARDIOVERSION N/A 09/15/2016   Procedure: TRANSESOPHAGEAL ECHOCARDIOGRAM (TEE);  Surgeon: Grace Isaac, MD;  Location: [place];  Service: Open Heart Surgery;  Laterality: N/A;    Family History  Problem Relation Age of Onset  . Diabetes Mother   . Diabetes Father     Social History  Reviewed with no changes to be made today.   Outpatient Medications Prior to Visit  Medication Sig Dispense Refill  . ACCU-CHEK SOFTCLIX LANCETS lancets Use as instructed to check blood sugar 3 times weekly before breakfast. E11.9 100 each 12  . aspirin 81 MG tablet Take 1 tablet (81 mg total) by mouth daily. 30 tablet 12  . atorvastatin (LIPITOR) 80 MG tablet Take 1 tablet (80 mg total) by mouth daily at 6 PM. 90 tablet 1  . Blood Glucose Monitoring Suppl (TRUE METRIX METER) w/Device KIT Use as instructed 1 kit 0  . diclofenac sodium (VOLTAREN) 1 % GEL Apply 2 g topically 4 (four) times daily. 100 g 1  . glipiZIDE (GLUCOTROL) 10 MG tablet Take 1 tablet (10 mg total) by mouth 2 (two) times daily before a meal. 60 tablet 3  . glucose blood (ACCU-CHEK AVIVA PLUS) test strip Use as instructed to check blood sugar 3 times weekly before breakfast. E11.9 100 each 12  . Insulin Detemir (LEVEMIR) 100 UNIT/ML Pen Inject 20 Units into the skin daily at 10 pm. 15 mL 6  . Insulin Pen Needle (B-D UF III MINI PEN NEEDLES) 31G X 5 MM MISC Use as instructed 90 each 1  . losartan (COZAAR) 100 MG tablet Take 1 tablet (100 mg total) by mouth daily. 90 tablet 0  . metFORMIN (GLUCOPHAGE) 1000 MG tablet Take 1 tablet (1,000 mg total) by mouth 2 (two)  times daily with a meal. 60 tablet 2  . metoprolol tartrate (LOPRESSOR) 25 MG tablet Take 1 tablet (25 mg total) by mouth 2 (two) times daily. 180 tablet 3  . Misc. Devices MISC Please provide patient with an insurance approved QUAD CANE 1 each 0  . naproxen (NAPROSYN) 500 MG tablet Take 1 tablet (500 mg total) by mouth 2 (two) times daily with a meal. 20 tablet 0  . meloxicam (MOBIC) 15 MG tablet Take 15 mg by mouth daily.  2  . triamcinolone cream (KENALOG) 0.1 % Apply 1 application topically 2 (two) times daily. (Patient not taking: Reported on 09/12/2017) 30 g 0  . valACYclovir (VALTREX) 1000 MG tablet Take 1,000 mg by mouth daily.  1   No facility-administered  medications prior to visit.     No Known Allergies     Objective:    BP (!) 148/79 (BP Location: Right Arm, Patient Position: Sitting, Cuff Size: Normal)   Pulse 79   Temp 98.5 F (36.9 C) (Oral)   Ht '5\' 5"'  (1.651 m)   Wt 145 lb 12.8 oz (66.1 kg)   SpO2 97%   BMI 24.26 kg/m  Wt Readings from Last 3 Encounters:  09/12/17 145 lb 12.8 oz (66.1 kg)  09/12/17 145 lb 6.4 oz (66 kg)  08/10/17 148 lb 9.6 oz (67.4 kg)    Physical Exam  Constitutional: He is oriented to person, place, and time. He appears well-developed and well-nourished. He is cooperative.  HENT:  Head: Normocephalic and atraumatic.  Eyes: EOM are normal.  Neck: Normal range of motion.  Cardiovascular: Normal rate, regular rhythm and normal heart sounds. Exam reveals no gallop and no friction rub.  No murmur heard. Pulses:      Dorsalis pedis pulses are 2+ on the right side, and 2+ on the left side.       Posterior tibial pulses are 2+ on the right side, and 2+ on the left side.  Pulmonary/Chest: Effort normal and breath sounds normal. No tachypnea. No respiratory distress. He has no decreased breath sounds. He has no wheezes. He has no rhonchi. He has no rales. He exhibits no tenderness.  Abdominal: Soft. Bowel sounds are normal.  Musculoskeletal: Normal range of motion. He exhibits no edema.  Feet:  Right Foot:  Protective Sensation: 10 sites tested. 10 sites sensed.  Skin Integrity: Positive for callus and dry skin. Negative for skin breakdown.  Left Foot:  Protective Sensation: 10 sites tested. 10 sites sensed.  Skin Integrity: Positive for callus and dry skin. Negative for skin breakdown.  Neurological: He is alert and oriented to person, place, and time. Coordination normal.  Skin: Skin is warm and dry.  Psychiatric: He has a normal mood and affect. His behavior is normal. Judgment and thought content normal.  Nursing note and vitals reviewed.      Patient has been counseled extensively about  nutrition and exercise as well as the importance of adherence with medications and regular follow-up. The patient was given clear instructions to go to ER or return to medical center if symptoms don't improve, worsen or new problems develop. The patient verbalized understanding.   Follow-up: Return in about 5 weeks (around 10/19/2017) for HTN/HPL/DM.   Gildardo Pounds, FNP-BC [facility] and [facility], [place]   09/12/2017, 2:27 PM

## 2017-09-16 ENCOUNTER — Other Ambulatory Visit: Payer: Self-pay | Admitting: Nurse Practitioner

## 2017-09-16 DIAGNOSIS — E7849 Other hyperlipidemia: Secondary | ICD-10-CM

## 2017-10-02 DIAGNOSIS — M25561 Pain in right knee: Secondary | ICD-10-CM | POA: Diagnosis not present

## 2017-10-02 DIAGNOSIS — M25562 Pain in left knee: Secondary | ICD-10-CM | POA: Diagnosis not present

## 2017-10-02 DIAGNOSIS — M545 Low back pain: Secondary | ICD-10-CM | POA: Diagnosis not present

## 2017-10-18 ENCOUNTER — Ambulatory Visit: Payer: Medicaid Other | Admitting: Nurse Practitioner

## 2017-10-18 DIAGNOSIS — M25561 Pain in right knee: Secondary | ICD-10-CM | POA: Diagnosis not present

## 2017-10-18 DIAGNOSIS — M25562 Pain in left knee: Secondary | ICD-10-CM | POA: Diagnosis not present

## 2017-10-24 DIAGNOSIS — M25561 Pain in right knee: Secondary | ICD-10-CM | POA: Diagnosis not present

## 2017-12-26 ENCOUNTER — Ambulatory Visit: Payer: Medicare Other | Attending: Nurse Practitioner | Admitting: Nurse Practitioner

## 2017-12-26 ENCOUNTER — Encounter: Payer: Self-pay | Admitting: Nurse Practitioner

## 2017-12-26 VITALS — BP 147/82 | HR 65 | Temp 98.2°F | Ht 65.0 in | Wt 139.2 lb

## 2017-12-26 DIAGNOSIS — Z79899 Other long term (current) drug therapy: Secondary | ICD-10-CM | POA: Insufficient documentation

## 2017-12-26 DIAGNOSIS — I10 Essential (primary) hypertension: Secondary | ICD-10-CM | POA: Diagnosis not present

## 2017-12-26 DIAGNOSIS — Z794 Long term (current) use of insulin: Secondary | ICD-10-CM | POA: Insufficient documentation

## 2017-12-26 DIAGNOSIS — Z1211 Encounter for screening for malignant neoplasm of colon: Secondary | ICD-10-CM

## 2017-12-26 DIAGNOSIS — Z951 Presence of aortocoronary bypass graft: Secondary | ICD-10-CM | POA: Insufficient documentation

## 2017-12-26 DIAGNOSIS — Z7982 Long term (current) use of aspirin: Secondary | ICD-10-CM | POA: Diagnosis not present

## 2017-12-26 DIAGNOSIS — I2510 Atherosclerotic heart disease of native coronary artery without angina pectoris: Secondary | ICD-10-CM

## 2017-12-26 DIAGNOSIS — E782 Mixed hyperlipidemia: Secondary | ICD-10-CM

## 2017-12-26 DIAGNOSIS — I252 Old myocardial infarction: Secondary | ICD-10-CM | POA: Insufficient documentation

## 2017-12-26 DIAGNOSIS — E1165 Type 2 diabetes mellitus with hyperglycemia: Secondary | ICD-10-CM

## 2017-12-26 LAB — GLUCOSE, POCT (MANUAL RESULT ENTRY): POC GLUCOSE: 206 mg/dL — AB (ref 70–99)

## 2017-12-26 LAB — POCT GLYCOSYLATED HEMOGLOBIN (HGB A1C): HEMOGLOBIN A1C: 12.3 % — AB (ref 4.0–5.6)

## 2017-12-26 MED ORDER — LOSARTAN POTASSIUM 100 MG PO TABS
100.0000 mg | ORAL_TABLET | Freq: Every day | ORAL | 0 refills | Status: DC
Start: 2017-12-26 — End: 2018-04-02

## 2017-12-26 MED ORDER — METFORMIN HCL 1000 MG PO TABS: 1000.0000 mg | ORAL_TABLET | Freq: Two times a day (BID) | ORAL | 3 refills | Status: DC

## 2017-12-26 MED ORDER — INSULIN DETEMIR 100 UNIT/ML FLEXPEN: 20.0000 [IU] | PEN_INJECTOR | Freq: Every day | SUBCUTANEOUS | 11 refills | Status: DC

## 2017-12-26 MED ORDER — ATORVASTATIN CALCIUM 80 MG PO TABS: ORAL_TABLET | ORAL | 1 refills | Status: DC

## 2017-12-26 MED ORDER — METOPROLOL TARTRATE 25 MG PO TABS
25.0000 mg | ORAL_TABLET | Freq: Two times a day (BID) | ORAL | 3 refills | Status: DC
Start: 2017-12-26 — End: 2018-03-13

## 2017-12-26 MED ORDER — GLIPIZIDE 10 MG PO TABS: 10.0000 mg | ORAL_TABLET | Freq: Two times a day (BID) | ORAL | 3 refills | Status: DC

## 2017-12-26 MED ORDER — INSULIN PEN NEEDLE 31G X 5 MM MISC: 1 refills | Status: DC

## 2017-12-26 MED ORDER — GLUCOSE BLOOD VI STRP: ORAL_STRIP | 12 refills | Status: DC

## 2017-12-26 MED ORDER — ACCU-CHEK SOFTCLIX LANCETS MISC: 12 refills | Status: DC

## 2017-12-26 MED FILL — TRUEPLUS PEN NDL 31GX3/16": 31G X 5 MM | 25 days supply | Qty: 100 | Fill #0

## 2017-12-26 MED FILL — LEVEMIR FLEXTOUCH 100 UNITS: 100 | 75 days supply | Qty: 15 | Fill #0

## 2017-12-26 MED FILL — TRUEPLUS PEN NDL 31GX3/16: 31G X 5 MM | 25 days supply | Qty: 100 | Fill #0

## 2017-12-26 MED FILL — METOPROLOL TARTRATE 25 MG T: 25 | 90 days supply | Qty: 180 | Fill #0

## 2017-12-26 MED FILL — LOSARTAN POTASSIUM 100 MG T: 100 | 90 days supply | Qty: 90 | Fill #0

## 2017-12-26 NOTE — Progress Notes (Signed)
Assessment & Plan:  Carl Clark was seen today for follow-up.  Diagnoses and all orders for this visit:  Uncontrolled type 2 diabetes mellitus with hyperglycemia (HCC) -     Glucose (CBG) -     HgB A1c -     CBC -     CMP14+EGFR -     Microalbumin/Creatinine Ratio, Urine -     metFORMIN (GLUCOPHAGE) 1000 MG tablet; Take 1 tablet (1,000 mg total) by mouth 2 (two) times daily with a meal. -     Insulin Detemir (LEVEMIR) 100 UNIT/ML Pen; Inject 20 Units into the skin daily at 10 pm. -     Insulin Pen Needle (B-D UF III MINI PEN NEEDLES) 31G X 5 MM MISC; Use as instructed -     glipiZIDE (GLUCOTROL) 10 MG tablet; Take 1 tablet (10 mg total) by mouth 2 (two) times daily before a meal. -     ACCU-CHEK SOFTCLIX LANCETS lancets; Use as instructed to check blood sugar 3 times weekly before breakfast. E11.9 -     glucose blood (ACCU-CHEK AVIVA PLUS) test strip; Use as instructed to check blood sugar 3 times weekly before breakfast. E11.9  Essential hypertension -     losartan (COZAAR) 100 MG tablet; Take 1 tablet (100 mg total) by mouth daily. -     metoprolol tartrate (LOPRESSOR) 25 MG tablet; Take 1 tablet (25 mg total) by mouth 2 (two) times daily.  Diabetes is poorly controlled. Advised patient to keep a fasting blood sugar log fast, 2 hours post lunch and bedtime which will be reviewed at the next office visit.  Mixed hyperlipidemia -     Lipid panel -     atorvastatin (LIPITOR) 80 MG tablet; TAKE 1 TABLET BY MOUTH ONCE DAILY AT 6 PM. INSTRUCTIONS: Work on a low fat, heart healthy diet and participate in regular aerobic exercise program by working out at least 150 minutes per week; 5 days a week-30 minutes per day. Avoid red meat, fried foods. junk foods, sodas, sugary drinks, unhealthy snacking, alcohol and smoking.  Drink at least 48oz of water per day and monitor your carbohydrate intake daily.    Coronary artery disease involving native coronary artery of native heart without angina  pectoris -     metoprolol tartrate (LOPRESSOR) 25 MG tablet; Take 1 tablet (25 mg total) by mouth 2 (two) times daily. Continue follow up with Cardiology.  CAD status post non-ST elevation myocardial infarction in June 2018 treated with multivessel CABG by Dr. Servando Snare (LIMA-LAD, SVG-diagonal, SVG-OM).  EF was normal.  He was last seen in 08/2017 with Cardiology.    Colon cancer screening -     Ambulatory referral to Gastroenterology    Patient has been counseled on age-appropriate routine health concerns for screening and prevention. These are reviewed and up-to-date. Referrals have been placed accordingly. Immunizations are up-to-date or declined.    Subjective:   Chief Complaint  Patient presents with  . Follow-up    Pt. is here for a follow-up on diabetes, hypertension, and cholesterol. Patient have not eat anything this morning.    HPI Carl Clark 67 y.o. male presents to office today for follow up of HTN, DM and HPL. He has unintentional weight loss, endorses fatigue, malaise and decreased appetite. VRI was used to communicate directly with patient for the entire encounter including providing detailed patient instructions.   Hypertension He is not exercising and is adherent to low salt diet.  He does not have a  blood pressure log  today.  Blood pressure is well controlled at home. He has been out of his losartan for over a month. Will restart and have him return for BP recheck in 6 weeks.  Cardiac symptoms none. Patient denies chest pain, chest pressure/discomfort, exertional chest pressure/discomfort, lower extremity edema, paroxysmal nocturnal dyspnea and tachypnea.  Cardiovascular risk factors: advanced age (older than 66 for men, 69 for women), diabetes mellitus, dyslipidemia, hypertension and male gender. Use of agents associated with hypertension: none.  History of target organ damage: angina/ prior myocardial infarction. BP Readings from Last 3 Encounters:    12/26/17 (!) 147/82  09/12/17 (!) 148/79  09/12/17 130/70    Hyperlipidemia Patient presents for follow up to hyperlipidemia.  He is medication compliant. He is diet compliant and denies skin xanthelasma or statin intolerance including myalgias.  LDL at goal.  Lab Results  Component Value Date   CHOL 87 (L) 03/17/2017   Lab Results  Component Value Date   HDL 30 (L) 03/17/2017   Lab Results  Component Value Date   LDLCALC 49 03/17/2017   Lab Results  Component Value Date   TRIG 39 03/17/2017   Lab Results  Component Value Date   CHOLHDL 2.9 03/17/2017      Diabetes Mellitus Type II He has been out of levemir insulin for over 1 month. Will refill and have him return with meter to determine if needs medication adjustment.  Current symptoms/problems include hyperglycemia and weight loss with decreased appetite and have been worsening.  Known diabetic complications: impotence and cardiovascular disease Cardiovascular risk factors: advanced age (older than 36 for men, 2 for women), diabetes mellitus, dyslipidemia, hypertension and male gender Current diabetic medications include: glipizide 10 bid, Levemir insulin 20 units at night (he ran out of refills of insulin over a month ago) metformin 1000 mg BID.  Eye exam current (within one year): yes Weight trend: stable; decreasing Prior visit with dietician: no Current monitoring regimen: office lab tests - 4 times yearly Home blood sugar records: 180-200s Any episodes of hypoglycemia? no Is He on ACE inhibitor or angiotensin II receptor blocker?  Yes  Lab Results  Component Value Date   HGBA1C 12.3 (A) 12/26/2017   HGBA1C 7.9 07/18/2017   HGBA1C 11.4 04/19/2017      Review of Systems  Constitutional: Positive for malaise/fatigue and weight loss. Negative for fever.  HENT: Negative.  Negative for nosebleeds.   Eyes: Negative.  Negative for blurred vision, double vision and photophobia.  Respiratory: Negative.   Negative for cough and shortness of breath.   Cardiovascular: Positive for chest pain (reproducible; patient had fall several months ago). Negative for palpitations and leg swelling.  Gastrointestinal: Negative.  Negative for heartburn, nausea and vomiting.  Musculoskeletal: Negative.  Negative for myalgias.  Neurological: Negative.  Negative for dizziness, focal weakness, seizures and headaches.  Psychiatric/Behavioral: Negative for suicidal ideas. The patient has insomnia.     Past Medical History:  Diagnosis Date  . CAD (coronary artery disease) 03/16/2017   S/p NSTEMI 6/18 >> LHC: multivessel CAD >> s/p CABG // Echo 6/18: Mild LVH, EF 60-65, normal wall motion, grade 1 diastolic dysfunction, normal RV SF, PASP 36 (borderline pulmonary hypertension) // Pre-CABG Dopplers 09/13/16: Bilateral ICA 1-39  . Family history of anesthesia complication    "never had anesthesia" (07/14/2012)  . History of non-ST elevation myocardial infarction (NSTEMI) 09/12/2016   Treated with CABG  . History of rectal bleeding    secondary to diverticulum affecting  right hepatic flexure - 06/2012  . Hypertension   . Type II diabetes mellitus ([place])     Past Surgical History:  Procedure Laterality Date  . COLONOSCOPY Left 07/14/2012   Procedure: COLONOSCOPY;  Surgeon: Arta Silence, MD;  Location[facility] ENDOSCOPY;  Service: Endoscopy;  Laterality: Left;  . CORONARY ARTERY BYPASS GRAFT N/A 09/15/2016   Procedure: CORONARY ARTERY BYPASS GRAFTING (CABG) x 3, LIMA to LAD, SVG to DIAGONAL, SVG to OM2, USING LEFT MAMMARY ARTERY AND RIGHT GREATER SAPHENOUS VEIN HARVESTED ENDOSCOPICALLY;  Surgeon: Grace Isaac, MD;  Location: [place];  Service: Open Heart Surgery;  Laterality: N/A;  . ESOPHAGOGASTRODUODENOSCOPY N/A 07/13/2012   Procedure: ESOPHAGOGASTRODUODENOSCOPY (EGD);  Surgeon: Arta Silence, MD;  Location: [facility] ENDOSCOPY;  Service: Endoscopy;  Laterality: N/A;  pt in ED A8  . LEFT HEART CATH AND CORONARY ANGIOGRAPHY N/A  09/12/2016   Procedure: Left Heart Cath and Coronary Angiography;  Surgeon: Sherren Mocha, MD;  Location: [place] CV LAB;  Service: Cardiovascular;  Laterality: N/A;  . NO PAST SURGERIES    . TEE WITHOUT CARDIOVERSION N/A 09/15/2016   Procedure: TRANSESOPHAGEAL ECHOCARDIOGRAM (TEE);  Surgeon: Grace Isaac, MD;  Location: [place];  Service: Open Heart Surgery;  Laterality: N/A;    Family History  Problem Relation Age of Onset  . Diabetes Mother   . Diabetes Father     Social History Reviewed with no changes to be made today.   Outpatient Medications Prior to Visit  Medication Sig Dispense Refill  . aspirin 81 MG tablet Take 1 tablet (81 mg total) by mouth daily. 30 tablet 12  . Blood Glucose Monitoring Suppl (TRUE METRIX METER) w/Device KIT Use as instructed 1 kit 0  . meloxicam (MOBIC) 15 MG tablet Take 15 mg by mouth daily.  2  . Misc. Devices MISC Please provide patient with an insurance approved QUAD CANE 1 each 0  . naproxen (NAPROSYN) 500 MG tablet Take 1 tablet (500 mg total) by mouth 2 (two) times daily with a meal. 20 tablet 0  . sildenafil (VIAGRA) 25 MG tablet Take 1 tablet (25 mg total) by mouth daily as needed for erectile dysfunction. Do not take with nitroglycerin. Take 30 min. before sex 10 tablet 0  . valACYclovir (VALTREX) 1000 MG tablet Take 1,000 mg by mouth daily.  1  . ACCU-CHEK SOFTCLIX LANCETS lancets Use as instructed to check blood sugar 3 times weekly before breakfast. E11.9 100 each 12  . atorvastatin (LIPITOR) 80 MG tablet TAKE 1 TABLET BY MOUTH ONCE DAILY AT 6 PM. 90 tablet 1  . glipiZIDE (GLUCOTROL) 10 MG tablet Take 1 tablet (10 mg total) by mouth 2 (two) times daily before a meal. 60 tablet 3  . glucose blood (ACCU-CHEK AVIVA PLUS) test strip Use as instructed to check blood sugar 3 times weekly before breakfast. E11.9 100 each 12  . Insulin Detemir (LEVEMIR) 100 UNIT/ML Pen Inject 20 Units into the skin daily at 10 pm. 15 mL 6  . Insulin Pen  Needle (B-D UF III MINI PEN NEEDLES) 31G X 5 MM MISC Use as instructed 90 each 1  . metFORMIN (GLUCOPHAGE) 1000 MG tablet Take 1 tablet (1,000 mg total) by mouth 2 (two) times daily with a meal. 60 tablet 2  . metoprolol tartrate (LOPRESSOR) 25 MG tablet Take 1 tablet (25 mg total) by mouth 2 (two) times daily. 180 tablet 3  . diclofenac sodium (VOLTAREN) 1 % GEL Apply 2 g topically 4 (four) times daily. (Patient  not taking: Reported on 12/26/2017) 100 g 1  . triamcinolone cream (KENALOG) 0.1 % Apply 1 application topically 2 (two) times daily. (Patient not taking: Reported on 09/12/2017) 30 g 0  . losartan (COZAAR) 100 MG tablet Take 1 tablet (100 mg total) by mouth daily. 90 tablet 0   No facility-administered medications prior to visit.     No Known Allergies     Objective:    BP (!) 147/82 (BP Location: Left Arm, Patient Position: Sitting, Cuff Size: Normal)   Pulse 65   Temp 98.2 F (36.8 C) (Oral)   Ht 5' 5" (1.651 m)   Wt 139 lb 3.2 oz (63.1 kg)   SpO2 98%   BMI 23.16 kg/m  Wt Readings from Last 3 Encounters:  12/26/17 139 lb 3.2 oz (63.1 kg)  09/12/17 145 lb 12.8 oz (66.1 kg)  09/12/17 145 lb 6.4 oz (66 kg)    Physical Exam  Constitutional: He is oriented to person, place, and time. He appears well-developed and well-nourished. He is cooperative.  HENT:  Head: Normocephalic and atraumatic.  Eyes: EOM are normal.  Neck: Normal range of motion.  Cardiovascular: Normal rate, regular rhythm, normal heart sounds and intact distal pulses. Exam reveals no gallop and no friction rub.  No murmur heard. Pulmonary/Chest: Effort normal and breath sounds normal. No tachypnea. No respiratory distress. He has no decreased breath sounds. He has no wheezes. He has no rhonchi. He has no rales. He exhibits no tenderness.  Abdominal: Bowel sounds are normal.  Musculoskeletal: Normal range of motion. He exhibits no edema.  Neurological: He is alert and oriented to person, place, and time.  Coordination normal.  Skin: Skin is warm and dry.  Psychiatric: He has a normal mood and affect. His behavior is normal. Judgment and thought content normal.  Nursing note and vitals reviewed.        Patient has been counseled extensively about nutrition and exercise as well as the importance of adherence with medications and regular follow-up. The patient was given clear instructions to go to ER or return to medical center if symptoms don't improve, worsen or new problems develop. The patient verbalized understanding.   Follow-up: Return in about 3 months (around 03/28/2018) for 3 months with me and 6 weeks with Lurena Joiner to go over meter.   Gildardo Pounds, FNP-BC [facility] and [facility], [place]   12/26/2017, 9:12 AM

## 2017-12-26 NOTE — Patient Instructions (Addendum)
Diabetes blood sugar goals  Fasting in AM before breakfast which means at least 8 hrs of no eating or drinking) except water or unsweetened coffee or tea): 90-130 2 hrs after meals: < 180,   Hypoglycemia or low blood sugar: < 70 (You should not have hypoglycemia.)   You can try Melatonin 2 tablets at night to sleep or Tylenol PM 1 tablet

## 2017-12-27 LAB — CBC
HEMATOCRIT: 35.9 % — AB (ref 37.5–51.0)
Hemoglobin: 12.2 g/dL — ABNORMAL LOW (ref 13.0–17.7)
MCH: 31.2 pg (ref 26.6–33.0)
MCHC: 34 g/dL (ref 31.5–35.7)
MCV: 92 fL (ref 79–97)
PLATELETS: 306 10*3/uL (ref 150–450)
RBC: 3.91 x10E6/uL — ABNORMAL LOW (ref 4.14–5.80)
RDW: 13.6 % (ref 12.3–15.4)
WBC: 4 10*3/uL (ref 3.4–10.8)

## 2017-12-27 LAB — LIPID PANEL
CHOLESTEROL TOTAL: 96 mg/dL — AB (ref 100–199)
Chol/HDL Ratio: 2.7 ratio (ref 0.0–5.0)
HDL: 35 mg/dL — AB (ref >39)
LDL Calculated: 45 mg/dL (ref 0–99)
TRIGLYCERIDES: 81 mg/dL (ref 0–149)
VLDL Cholesterol Cal: 16 mg/dL (ref 5–40)

## 2017-12-27 LAB — CMP14+EGFR
ALK PHOS: 79 IU/L (ref 39–117)
ALT: 16 IU/L (ref 0–44)
AST: 14 IU/L (ref 0–40)
Albumin/Globulin Ratio: 1 — ABNORMAL LOW (ref 1.2–2.2)
Albumin: 4 g/dL (ref 3.6–4.8)
BUN/Creatinine Ratio: 13 (ref 10–24)
BUN: 13 mg/dL (ref 8–27)
Bilirubin Total: 0.5 mg/dL (ref 0.0–1.2)
CALCIUM: 9.3 mg/dL (ref 8.6–10.2)
CO2: 21 mmol/L (ref 20–29)
Chloride: 101 mmol/L (ref 96–106)
Creatinine, Ser: 0.98 mg/dL (ref 0.76–1.27)
GFR calc Af Amer: 92 mL/min/{1.73_m2} (ref >59)
GFR calc non Af Amer: 80 mL/min/{1.73_m2} (ref >59)
GLOBULIN, TOTAL: 3.9 g/dL (ref 1.5–4.5)
Glucose: 211 mg/dL — ABNORMAL HIGH (ref 65–99)
POTASSIUM: 3.8 mmol/L (ref 3.5–5.2)
SODIUM: 137 mmol/L (ref 134–144)
Total Protein: 7.9 g/dL (ref 6.0–8.5)

## 2017-12-27 LAB — MICROALBUMIN / CREATININE URINE RATIO
Creatinine, Urine: 128.5 mg/dL
MICROALB/CREAT RATIO: 17.4 mg/g{creat} (ref 0.0–30.0)
Microalbumin, Urine: 22.3 ug/mL

## 2017-12-29 NOTE — Telephone Encounter (Signed)
error 

## 2018-01-02 ENCOUNTER — Telehealth: Payer: Self-pay

## 2018-01-02 NOTE — Telephone Encounter (Signed)
CMA attempt to reach patient to inform on results.  No answer and left a VM for patient to call back.   If patient cal back, please inform:  Please let patient know GI has been trying to contact him to schedule in regards to his anemia. Also kidney and liver function are normal. Cholesterol levels are normal. Continue all medications. Try to walk more. At least 30 minutes per day/5 days per week.  A letter will be send out to reach patient.

## 2018-01-02 NOTE — Telephone Encounter (Signed)
-----   Message from Claiborne Rigg, NP sent at 12/27/2017  1:33 PM EDT ----- Please let patient know GI has been trying to contact him to schedule in regards to his anemia. Also kidney and liver function are normal. Cholesterol levels are normal. Continue all medications. Try to walk more. At least 30 minutes per day/5 days per week.

## 2018-01-29 ENCOUNTER — Encounter: Payer: Self-pay | Admitting: Nurse Practitioner

## 2018-02-06 ENCOUNTER — Ambulatory Visit: Payer: Medicare Other | Attending: Family Medicine | Admitting: Pharmacist

## 2018-02-06 ENCOUNTER — Encounter: Payer: Self-pay | Admitting: Pharmacist

## 2018-02-06 DIAGNOSIS — Z7984 Long term (current) use of oral hypoglycemic drugs: Secondary | ICD-10-CM | POA: Diagnosis not present

## 2018-02-06 DIAGNOSIS — E1165 Type 2 diabetes mellitus with hyperglycemia: Secondary | ICD-10-CM | POA: Diagnosis not present

## 2018-02-06 DIAGNOSIS — I2510 Atherosclerotic heart disease of native coronary artery without angina pectoris: Secondary | ICD-10-CM | POA: Diagnosis not present

## 2018-02-06 DIAGNOSIS — E118 Type 2 diabetes mellitus with unspecified complications: Secondary | ICD-10-CM

## 2018-02-06 DIAGNOSIS — IMO0002 Reserved for concepts with insufficient information to code with codable children: Secondary | ICD-10-CM

## 2018-02-06 DIAGNOSIS — Z79899 Other long term (current) drug therapy: Secondary | ICD-10-CM | POA: Diagnosis not present

## 2018-02-06 DIAGNOSIS — I10 Essential (primary) hypertension: Secondary | ICD-10-CM | POA: Insufficient documentation

## 2018-02-06 DIAGNOSIS — Z951 Presence of aortocoronary bypass graft: Secondary | ICD-10-CM | POA: Insufficient documentation

## 2018-02-06 LAB — GLUCOSE, POCT (MANUAL RESULT ENTRY): POC Glucose: 94 mg/dL (ref 70–99)

## 2018-02-06 MED ORDER — LIRAGLUTIDE 18 MG/3ML ~~LOC~~ SOPN: PEN_INJECTOR | SUBCUTANEOUS | 2 refills | Status: DC

## 2018-02-06 MED ORDER — INSULIN PEN NEEDLE 32G X 4 MM MISC: 11 refills | Status: DC

## 2018-02-06 MED FILL — TRUEPLUS PEN NDL 32GX5/32": 32G X 4 MM | 25 days supply | Qty: 100 | Fill #0

## 2018-02-06 MED FILL — VICTOZA 18 MG/3 ML INJECT P: 18 | 28 days supply | Qty: 9 | Fill #0

## 2018-02-06 MED FILL — TRUEPLUS PEN NDL 32GX5/32: 32G X 4 MM | 25 days supply | Qty: 100 | Fill #0

## 2018-02-06 NOTE — Progress Notes (Signed)
    S:     No chief complaint on file.  Patient is a 67 YO male with a PMH significant for HTN, CAD, T2DM, NTEMI s/p CABG, and hyperlipidemia who presents for DM management. Patient arrives in good spirits. Patient was referred on 12/26/17 by his PCP. Levemir was refilled at that visit as patient reported being without his insulin for over 1 month.   Today, patient reports adherence to glipizide 10 mg BID, Levemir 20 units daily, and metformin 1000 mg BID. He is taking losartan and metoprolol d/t cardiac hx and HTN.   Patient reports hypoglycemic events. Several home values listed in the 50s-60s. He knows the right things to eat but does not eat much during the day. He reports playing soccer on the weekends and walking constantly at work during the week. He denies symptoms of hyperglycemia and endorses performing self foot exams.    Family/Social History: DM (mother, father), never smoker, denies alcohol  Insurance coverage/medication affordability: Medicare A&B, [employer] (Part D plan)  O:  POCT: 94 Home values: 58 - 223  Lab Results  Component Value Date   HGBA1C 12.3 (A) 12/26/2017   There were no vitals filed for this visit.  Lipid Panel     Component Value Date/Time   CHOL 96 (L) 12/26/2017 0922   TRIG 81 12/26/2017 0922   HDL 35 (L) 12/26/2017 0922   CHOLHDL 2.7 12/26/2017 0922   CHOLHDL 4.8 09/12/2016 0806   VLDL 29 09/12/2016 0806   LDLCALC 45 12/26/2017 0922   Clinical ASCVD: Yes - CAD, NSTEMI  A/P: Diabetes longstanding currently uncontrolled. Patient is able to verbalize appropriate hypoglycemia management plan. Patient is adherent with medication. Control is suboptimal due to dietary indiscretion. Looking at his home levels, it's clear that he's not eating during the day and then overeating at night. His A1c supports this.   With his hypoglycemia, will stop his glipizide and decrease Levemir to 15 units. Will continue metformin. With his ASCVD hx, pt will benefit  from GLP-1 RA therapy. This also poses less hypoglycemia vs insulin + SU therapy. Will start Victoza today.   -Started Victoza 0.6 mg daily x 1 week, then 1.2 mg daily x 1 week, then 1.8 mg daily thereafter.  - Reviewed necessary supplies and operation of the Victoza pen. Patient was able to demonstrate use.  -Decreased dose of Levemir to 15 units daily -Continued metformin 1000 mg BID.  -Extensively discussed pathophysiology of DM, recommended lifestyle interventions, dietary effects on glycemic control -Counseled on s/sx of and management of hypoglycemia -HM: Prevnar-13 anticipated 04/20/2018 -Next A1C anticipated 03/2018.   ASCVD risk - secondary prevention in patient with DM +NSTEMI/CAD. Last LDL is controlled. High intensity statin indicated. Aspirin is indicated.  -Continued aspirin 81 mg  -Continued atorvastatin 80 mg.   Written patient instructions provided. Total time in face to face counseling 30 minutes.   Follow up Pharmacist Clinic Visit 02/20/18.     Patient seen with: Leanne ChangJane Chu, PharmD Candidate [facility] [employer] 2021  Butch PennyLuke Van Ausdall, PharmD, CPP Clinical Pharmacist [facility] [phone]

## 2018-02-06 NOTE — Patient Instructions (Addendum)
  1. waqf glipizide 2. tuasil almaytafawrimin 3. aibda bitanawul 15 wahdatan ywmyana min liafir 4. albad' fi aitikhadh Victoza 5. muasalat altahaquq min alsakriaat fi aldam fi almanzil. 6. muasalat 'iijra' taghyirat namat alhayat alty naqashnaha maeaan khilal ziaratina. yaleab alhimyat waltamarin dwrana mhmana fi tahsin nisbat alsukar fi aldm. 7. almutabaeat maei fi 1 'usbue.   English translation:   Thank you for coming to see me today. Please do the following:  1. Stop glipizide.  2. Continue metformin.  3. Start taking 15 units daily of Levemir.  4. Start taking Victoza.   5. Continue checking blood sugars at home.  6. Continue making the lifestyle changes we've discussed together during our visit. Diet and exercise play a significant role in improving your blood sugars.  7. Follow-up with me in 1 week.    Hypoglycemia or low blood sugar:   Low blood sugar can happen quickly and may become an emergency if not treated right away.   While this shouldn't happen often, it can be brought upon if you skip a meal or do not eat enough. Also, if your insulin or other diabetes medications are dosed too high, this can cause your blood sugar to go to low.   Warning signs of low blood sugar include: 1. Feeling shaky or dizzy 2. Feeling weak or tired  3. Excessive hunger 4. Feeling anxious or upset  5. Sweating even when you aren't exercising  What to do if I experience low blood sugar? 1. Check your blood sugar with your meter. If lower than 70, proceed to step 2.  2. Treat with 3-4 glucose tablets or 3 packets of regular sugar. If these aren't around, you can try hard candy. Yet another option would be to drink 4 ounces of fruit juice or 6 ounces of REGULAR soda.  3. Re-check your sugar in 15 minutes. If it is still below 70, do what you did in step 2 again. If has come back up, go ahead and eat a snack or small meal at this time.

## 2018-02-20 ENCOUNTER — Ambulatory Visit: Payer: Medicare Other | Attending: Nurse Practitioner | Admitting: Pharmacist

## 2018-02-20 DIAGNOSIS — Z951 Presence of aortocoronary bypass graft: Secondary | ICD-10-CM | POA: Diagnosis not present

## 2018-02-20 DIAGNOSIS — Z23 Encounter for immunization: Secondary | ICD-10-CM | POA: Diagnosis not present

## 2018-02-20 DIAGNOSIS — Z833 Family history of diabetes mellitus: Secondary | ICD-10-CM | POA: Insufficient documentation

## 2018-02-20 DIAGNOSIS — I252 Old myocardial infarction: Secondary | ICD-10-CM | POA: Insufficient documentation

## 2018-02-20 DIAGNOSIS — E1165 Type 2 diabetes mellitus with hyperglycemia: Secondary | ICD-10-CM

## 2018-02-20 DIAGNOSIS — E119 Type 2 diabetes mellitus without complications: Secondary | ICD-10-CM | POA: Insufficient documentation

## 2018-02-20 DIAGNOSIS — I10 Essential (primary) hypertension: Secondary | ICD-10-CM | POA: Insufficient documentation

## 2018-02-20 DIAGNOSIS — Z794 Long term (current) use of insulin: Secondary | ICD-10-CM | POA: Insufficient documentation

## 2018-02-20 DIAGNOSIS — E785 Hyperlipidemia, unspecified: Secondary | ICD-10-CM | POA: Diagnosis not present

## 2018-02-20 DIAGNOSIS — I2510 Atherosclerotic heart disease of native coronary artery without angina pectoris: Secondary | ICD-10-CM | POA: Insufficient documentation

## 2018-02-20 LAB — GLUCOSE, POCT (MANUAL RESULT ENTRY): POC Glucose: 183 mg/dl — AB (ref 70–99)

## 2018-02-20 MED ORDER — INSULIN DETEMIR 100 UNIT/ML FLEXPEN: 25.0000 [IU] | PEN_INJECTOR | Freq: Every day | SUBCUTANEOUS | 2 refills | Status: DC

## 2018-02-20 MED ORDER — EMPAGLIFLOZIN 10 MG PO TABS: 10.0000 mg | ORAL_TABLET | Freq: Every day | ORAL | 2 refills | Status: DC

## 2018-02-20 MED ORDER — TETANUS-DIPHTH-ACELL PERTUSSIS 5-2.5-18.5 LF-MCG/0.5 IM SUSP
0.5000 mL | Freq: Once | INTRAMUSCULAR | Status: AC
  Administered 2018-02-20: 0.5 mL via INTRAMUSCULAR

## 2018-02-20 MED FILL — LEVEMIR FLEXTOUCH 100 UNITS: 100 | 24 days supply | Qty: 6 | Fill #0

## 2018-02-20 MED FILL — JARDIANCE 10 MG TABLET: 10 | 30 days supply | Qty: 30 | Fill #0

## 2018-02-20 NOTE — Progress Notes (Signed)
    S:    PCP: Bertram DenverZelda Fleming  No chief complaint on file.  Patient is a 67 YO male with a PMH significant for HTN, CAD, T2DM, NTEMI s/p CABG, and hyperlipidemia who presents for DM management. Patient arrives in good spirits. Patient was referred on 12/26/17 by his PCP. I last saw him 02/06/18. I added Victoza to his regimen. I stopped his glipizide.   Today, patient denies adherence to Victoza. Taking Levemir and metformin. He states that Victoza made him itch all over.   Patient denies hypoglycemic events. He reports adherence to a diabetic diet. Reports increase in exercise. Denies symptoms of hyperglycemia.   Family/Social History: DM (mother, father), never smoker, denies alcohol  Insurance coverage/medication affordability: Medicare A&B, [employer] (Part D plan)  O:  POCT: 183 Home values: 120-240  Lab Results  Component Value Date   HGBA1C 12.3 (A) 12/26/2017   There were no vitals filed for this visit.  Lipid Panel     Component Value Date/Time   CHOL 96 (L) 12/26/2017 0922   TRIG 81 12/26/2017 0922   HDL 35 (L) 12/26/2017 0922   CHOLHDL 2.7 12/26/2017 0922   CHOLHDL 4.8 09/12/2016 0806   VLDL 29 09/12/2016 0806   LDLCALC 45 12/26/2017 0922   Clinical ASCVD: Yes - CAD, NSTEMI  A/P: Diabetes longstanding currently uncontrolled. Patient is able to verbalize appropriate hypoglycemia management plan. Patient is not adherent with medication. Unfortunately, he cannot tolerate Victoza. An SGLT-2 inhibitor would provide him with ASCVD benefit. I will stop Victoza and add Jardiance 10 mg daily. Renal function WNL.  Additionally, will increase Levemir to 25 units daily for better glycemic control.  -Stop Victoza -Increase Levemir to 25 units daily -Continue Metformin -Start Jardiance 10 mg daily -Extensively discussed pathophysiology of DM, recommended lifestyle interventions, dietary effects on glycemic control -Counseled on s/sx of and management of hypoglycemia -HM:  Prevnar-13 anticipated 04/20/2018; tetanus given today, pt UTD on flu -Next A1C anticipated 03/2018.   ASCVD risk - secondary prevention in patient with DM +NSTEMI/CAD. Last LDL is controlled. High intensity statin indicated. Aspirin is indicated.  -Continued aspirin 81 mg  -Continued atorvastatin 80 mg.   Written patient instructions provided. Total time in face to face counseling 30 minutes.   Follow up 04/02/2018.  Butch PennyLuke Van Ausdall, PharmD, CPP Clinical Pharmacist [facility] [phone]

## 2018-02-20 NOTE — Patient Instructions (Signed)
Thank you for coming to see me today. Please do the following:  1. Stop Victoza.  2. Increase Levemir to 25 units daily 3. Continue metformin 2 times a day. 4. Start Jardiance 10 mg 1 time a day. 5. Continue checking blood sugars at home.  6. Continue making the lifestyle changes we've discussed together during our visit. Diet and exercise play a significant role in improving your blood sugars.  7. Follow-up with [name] in January.    Hypoglycemia or low blood sugar:   Low blood sugar can happen quickly and may become an emergency if not treated right away.   While this shouldn't happen often, it can be brought upon if you skip a meal or do not eat enough. Also, if your insulin or other diabetes medications are dosed too high, this can cause your blood sugar to go to low.   Warning signs of low blood sugar include: 1. Feeling shaky or dizzy 2. Feeling weak or tired  3. Excessive hunger 4. Feeling anxious or upset  5. Sweating even when you aren't exercising  What to do if I experience low blood sugar? 1. Check your blood sugar with your meter. If lower than 70, proceed to step 2.  2. Treat with 3-4 glucose tablets or 3 packets of regular sugar. If these aren't around, you can try hard candy. Yet another option would be to drink 4 ounces of fruit juice or 6 ounces of REGULAR soda.  3. Re-check your sugar in 15 minutes. If it is still below 70, do what you did in step 2 again. If has come back up, go ahead and eat a snack or small meal at this time.

## 2018-02-21 ENCOUNTER — Encounter: Payer: Self-pay | Admitting: Pharmacist

## 2018-03-12 ENCOUNTER — Encounter: Payer: Self-pay | Admitting: Physician Assistant

## 2018-03-12 NOTE — Progress Notes (Signed)
Cardiology Office Note    Date:  03/13/2018  ID:  Carl, Clark Mar 05, 1951, MRN [id number] PCP:  Gildardo Pounds, NP  Cardiologist:  Sherren Mocha, MD   Chief Complaint: check-up, rare chest discomfort, erectile dysfunction  History of Present Illness:  Carl Clark is a 67 y.o. male with history of diabetes, hypertension, CAD status post non-ST elevation myocardial infarction in June 2018 treated with multivessel CABG by Dr. Servando Snare (LIMA-LAD, SVG-diagonal, SVG-OM), normal LV function, erectile dysfunction who presents for 6 month follow-up. Last OV 08/2017, felt to be doing well. Last labs 12/2017 showed Hgb 12.2, Plt 306, K 3.8, Cr 0.98, LFTs ok, LDL 45, A1C 12.3.  He returns for follow-up today to discuss the above. A translator was used (Arabic). He is not due for a follow-up until 08/2018 but wanted to review if there is anything he can do for his ED. He believes it worsened after he was started on new medication after his bypass surgery. He was on ACEI/diuretic prior to that time, but metoprolol was started at discharge. He discussed this with PCP and was prescribed Viagra (with instructions for use) but does not feel this helped significantly. He also reports rare light fleeting chest discomfort lasting 1-2 minutes about every 2 months, nonexertional. It does not feel like prior angina. He is very active with his job and everyday life and has no exertional angina.   Past Medical History:  Diagnosis Date  . CAD (coronary artery disease) 03/16/2017   S/p NSTEMI 6/18 >> LHC: multivessel CAD >> s/p CABG // Echo 6/18: Mild LVH, EF 60-65, normal wall motion, grade 1 diastolic dysfunction, normal RV SF, PASP 36 (borderline pulmonary hypertension) // Pre-CABG Dopplers 09/13/16: Bilateral ICA 1-39  . Family history of anesthesia complication    "never had anesthesia" (07/14/2012)  . History of non-ST elevation myocardial infarction (NSTEMI) 09/12/2016   Treated with  CABG  . History of rectal bleeding    secondary to diverticulum affecting right hepatic flexure - 06/2012  . Hypertension   . Mild carotid artery disease (HCC)    a. 1-39% bilaterally preCABG in 2018.  . Type II diabetes mellitus ([place])     Past Surgical History:  Procedure Laterality Date  . COLONOSCOPY Left 07/14/2012   Procedure: COLONOSCOPY;  Surgeon: Arta Silence, MD;  Location[facility] ENDOSCOPY;  Service: Endoscopy;  Laterality: Left;  . CORONARY ARTERY BYPASS GRAFT N/A 09/15/2016   Procedure: CORONARY ARTERY BYPASS GRAFTING (CABG) x 3, LIMA to LAD, SVG to DIAGONAL, SVG to OM2, USING LEFT MAMMARY ARTERY AND RIGHT GREATER SAPHENOUS VEIN HARVESTED ENDOSCOPICALLY;  Surgeon: Grace Isaac, MD;  Location: [place];  Service: Open Heart Surgery;  Laterality: N/A;  . ESOPHAGOGASTRODUODENOSCOPY N/A 07/13/2012   Procedure: ESOPHAGOGASTRODUODENOSCOPY (EGD);  Surgeon: Arta Silence, MD;  Location[facility] ENDOSCOPY;  Service: Endoscopy;  Laterality: N/A;  pt in ED A8  . LEFT HEART CATH AND CORONARY ANGIOGRAPHY N/A 09/12/2016   Procedure: Left Heart Cath and Coronary Angiography;  Surgeon: Sherren Mocha, MD;  Location: [place] CV LAB;  Service: Cardiovascular;  Laterality: N/A;  . NO PAST SURGERIES    . TEE WITHOUT CARDIOVERSION N/A 09/15/2016   Procedure: TRANSESOPHAGEAL ECHOCARDIOGRAM (TEE);  Surgeon: Grace Isaac, MD;  Location: [place];  Service: Open Heart Surgery;  Laterality: N/A;    Current Medications: Current Meds  Medication Sig  . ACCU-CHEK SOFTCLIX LANCETS lancets Use as instructed to check blood sugar 3 times weekly before breakfast. E11.9  .  aspirin 81 MG tablet Take 1 tablet (81 mg total) by mouth daily.  Marland Kitchen atorvastatin (LIPITOR) 80 MG tablet TAKE 1 TABLET BY MOUTH ONCE DAILY AT 6 PM.  . Blood Glucose Monitoring Suppl (TRUE METRIX METER) w/Device KIT Use as instructed  . empagliflozin (JARDIANCE) 10 MG TABS tablet Take 10 mg by mouth daily.  Marland Kitchen glucose blood (ACCU-CHEK AVIVA  PLUS) test strip Use as instructed to check blood sugar 3 times weekly before breakfast. E11.9  . Insulin Detemir (LEVEMIR FLEXTOUCH) 100 UNIT/ML Pen Inject 25 Units into the skin daily.  . Insulin Pen Needle (TRUEPLUS PEN NEEDLES) 32G X 4 MM MISC Use as directed to inject.  Marland Kitchen losartan (COZAAR) 100 MG tablet Take 1 tablet (100 mg total) by mouth daily.  . meloxicam (MOBIC) 15 MG tablet Take 15 mg by mouth daily.  . metFORMIN (GLUCOPHAGE) 1000 MG tablet Take 1 tablet (1,000 mg total) by mouth 2 (two) times daily with a meal.  . Misc. Devices MISC Please provide patient with an insurance approved QUAD CANE  . naproxen (NAPROSYN) 500 MG tablet Take 1 tablet (500 mg total) by mouth 2 (two) times daily with a meal.  . sildenafil (VIAGRA) 25 MG tablet Take 1 tablet (25 mg total) by mouth daily as needed for erectile dysfunction. Do not take with nitroglycerin. Take 30 min. before sex  . triamcinolone cream (KENALOG) 0.1 % Apply 1 application topically 2 (two) times daily.  . [DISCONTINUED] diclofenac sodium (VOLTAREN) 1 % GEL Apply 2 g topically 4 (four) times daily.  . [DISCONTINUED] metoprolol tartrate (LOPRESSOR) 25 MG tablet Take 1 tablet (25 mg total) by mouth 2 (two) times daily.  . [DISCONTINUED] valACYclovir (VALTREX) 1000 MG tablet Take 1,000 mg by mouth daily.      Allergies:   Patient has no known allergies.   Social History   Socioeconomic History  . Marital status: Widowed    Spouse name: Not on file  . Number of children: Not on file  . Years of education: Not on file  . Highest education level: Not on file  Occupational History  . Not on file  Social Needs  . Financial resource strain: Not on file  . Food insecurity:    Worry: Not on file    Inability: Not on file  . Transportation needs:    Medical: Not on file    Non-medical: Not on file  Tobacco Use  . Smoking status: Never Smoker  . Smokeless tobacco: Never Used  Substance and Sexual Activity  . Alcohol use: No    . Drug use: No  . Sexual activity: Not Currently  Lifestyle  . Physical activity:    Days per week: Not on file    Minutes per session: Not on file  . Stress: Not on file  Relationships  . Social connections:    Talks on phone: Not on file    Gets together: Not on file    Attends religious service: Not on file    Active member of club or organization: Not on file    Attends meetings of clubs or organizations: Not on file    Relationship status: Not on file  Other Topics Concern  . Not on file  Social History Narrative  . Not on file     Family History:  The patient's family history includes Diabetes in his father and mother.  ROS:   Please see the history of present illness.  All other systems are reviewed and otherwise  negative.    PHYSICAL EXAM:   VS:  BP 140/80   Pulse 62   Ht '5\' 5"'  (1.651 m)   Wt 137 lb 3.2 oz (62.2 kg)   SpO2 98%   BMI 22.83 kg/m   BMI: Body mass index is 22.83 kg/m. GEN: Well nourished, well developed M, in no acute distress HEENT: normocephalic, atraumatic Neck: no JVD, carotid bruits, or masses Cardiac: RRR; no murmurs, rubs, or gallops, no edema  Respiratory:  clear to auscultation bilaterally, normal work of breathing GI: soft, nontender, nondistended, + BS MS: no deformity or atrophy Skin: warm and dry, no rash Neuro:  Alert and Oriented x 3, Strength and sensation are intact, follows commands Psych: euthymic mood, full affect  Wt Readings from Last 3 Encounters:  03/13/18 137 lb 3.2 oz (62.2 kg)  12/26/17 139 lb 3.2 oz (63.1 kg)  09/12/17 145 lb 12.8 oz (66.1 kg)      Studies/Labs Reviewed:   EKG:  EKG was ordered today and personally reviewed by me and demonstrates NSR 63bpm, cannot exclude prior anterior infarct, no acute STT changes  Recent Labs: 04/19/2017: TSH 1.790 12/26/2017: ALT 16; BUN 13; Creatinine, Ser 0.98; Hemoglobin 12.2; Platelets 306; Potassium 3.8; Sodium 137   Lipid Panel    Component Value Date/Time    CHOL 96 (L) 12/26/2017 0922   TRIG 81 12/26/2017 0922   HDL 35 (L) 12/26/2017 0922   CHOLHDL 2.7 12/26/2017 0922   CHOLHDL 4.8 09/12/2016 0806   VLDL 29 09/12/2016 0806   LDLCALC 45 12/26/2017 0922    Additional studies/ records that were reviewed today include: Summarized above  Intraoperative TEE 09/15/16 Moderate concentric LVH, EF 60-65, normal wall motion, trace TR  Echocardiogram 09/14/16 Mild LVH, EF 60-65, normal wall motion, grade 1 diastolic dysfunction, normal RV SF, PASP 36 (borderline pulmonary hypertension)  Cardiac catheterization 09/12/16 Three-vessel CAD>>referred for CABG   Pre-CABG Dopplers 09/13/16 Bilateral ICA 1-39   ASSESSMENT & PLAN:   1. CAD - overall doing well. He describes some rare fleeting chest discomfort but this is brief and nonexertional. EKG is nonacute. It is not particularly bothersome and he is able to remain very active without any cardiac limitation. For now will continue to observe and optimize BP as below. He will notify our office if episodes increase in duration, frequency or severity. We also discussed avoidance of NSAIDS (instead recommending Tylenol) for OTC pain relief, as he has both Mobic and naproxen listed on his med list. He was advised he certainly should not usually be taking both at the same time and was asked to review with primary care. 2. Essential HTN - BP 130/74 by tech, 140/80 by me. Review of recent OVs reveals mildly elevated BP. We are decreasing metoprolol today so will add amlodipine 35m daily. He states he sees primary care in a week at which time BP can be rechecked. The patient was instructed to monitor their blood pressure at home and to call if tending to run higher than 1017systolic. 3. Erectile dysfunction - likely multifactorial in setting of severely uncontrolled diabetes (probably the primary driver), cardiovascular disease, and medication therapy. This worsened after his admission in 2018 at which time  metoprolol was added. Will reduce metoprolol as a trial. Advised he discuss further with PCP. 4. Mild carotid disease in 08/2016 - no bruits on exam. Follow clinically and consider repeat [place] year if indicated. 5. Uncontrolled DM - discussed importance of management with patient. He has been compliant  with recent close f/u for this. Now attends community health and wellness.  Disposition: F/u with Dr. Burt Knack in 6 months.   Medication Adjustments/Labs and Tests Ordered: Current medicines are reviewed at length with the patient today.  Concerns regarding medicines are outlined above. Medication changes, Labs and Tests ordered today are summarized above and listed in the Patient Instructions accessible in Encounters.   Signed, Carl Pitter, PA-C  03/13/2018 8:31 AM    [address] [place], [place], [place]  [address] Phone: [phone]; Fax: [phone]

## 2018-03-13 ENCOUNTER — Ambulatory Visit (INDEPENDENT_AMBULATORY_CARE_PROVIDER_SITE_OTHER): Payer: Medicare Other | Admitting: Physician Assistant

## 2018-03-13 ENCOUNTER — Encounter: Payer: Self-pay | Admitting: Physician Assistant

## 2018-03-13 VITALS — BP 140/80 | HR 62 | Ht 65.0 in | Wt 137.2 lb

## 2018-03-13 DIAGNOSIS — I10 Essential (primary) hypertension: Secondary | ICD-10-CM

## 2018-03-13 DIAGNOSIS — Z951 Presence of aortocoronary bypass graft: Secondary | ICD-10-CM

## 2018-03-13 DIAGNOSIS — I739 Peripheral vascular disease, unspecified: Secondary | ICD-10-CM | POA: Diagnosis not present

## 2018-03-13 DIAGNOSIS — I2510 Atherosclerotic heart disease of native coronary artery without angina pectoris: Secondary | ICD-10-CM

## 2018-03-13 DIAGNOSIS — E1165 Type 2 diabetes mellitus with hyperglycemia: Secondary | ICD-10-CM | POA: Diagnosis not present

## 2018-03-13 DIAGNOSIS — I779 Disorder of arteries and arterioles, unspecified: Secondary | ICD-10-CM

## 2018-03-13 MED ORDER — METOPROLOL TARTRATE 25 MG PO TABS: 12.5000 mg | ORAL_TABLET | Freq: Two times a day (BID) | ORAL | 3 refills | Status: DC

## 2018-03-13 MED ORDER — AMLODIPINE BESYLATE 5 MG PO TABS: 5.0000 mg | ORAL_TABLET | Freq: Every day | ORAL | 3 refills | Status: DC

## 2018-03-13 NOTE — Patient Instructions (Addendum)
Medication Instructions:  Your physician has recommended you make the following change in your medication:  1.  START Amlodipine 5 mg taking 1 tablet daily 2.  DECREASE the Metoprolol to 25 mg taking 1/2 tablet by mouth twice a day  If you need a refill on your cardiac medications before your next appointment, please call your pharmacy.   Lab work: None ordered  If you have labs (blood work) drawn today and your tests are completely normal, you will receive your results only by: Marland Kitchen. MyChart Message (if you have MyChart) OR . A paper copy in the mail If you have any lab test that is abnormal or we need to change your treatment, we will call you to review the results.  Testing/Procedures: None ordered   Follow-Up: At [facility], you and your health needs are our priority.  As part of our continuing mission to provide you with exceptional heart care, we have created designated Provider Care Teams.  These Care Teams include your primary Cardiologist (physician) and Advanced Practice Providers (APPs -  Physician Assistants and Nurse Practitioners) who all work together to provide you with the care you need, when you need it. You will need a follow up appointment in:  6 months.  Please call our office 2 months in advance to schedule this appointment.  You may see Tonny BollmanMichael Cooper, MD or one of the following Advanced Practice Providers on your designated Care Team: Tereso NewcomerScott Weaver, PA-C Vin FairmountBhagat, [place]PA-C . Berton BonJanine Hammond, NP  Any Other Special Instructions Will Be Listed Below (If Applicable).  Please monitor your blood pressure occasionally at home. Call your doctor if you tend to get readings of greater than 130 on the top number or 80 on the bottom number.  Patients with heart disease on aspirin should generally limit medicines like naproxen, Aleve, meloxicam, Mobic, ibuprofen, Advil, Motrin due to cardiovascular risk and risk of stomach bleeding. You have both naproxen and meloxicam on your  medicine list and definitely should not be taking both at the same time. They should be limited as above as a general rule. You may take Tylenol as directed or talk to your primary doctor about alternatives.

## 2018-03-15 MED FILL — METOPROLOL TARTRATE 25 MG T: 25 | 30 days supply | Qty: 30 | Fill #0

## 2018-03-15 MED FILL — AMLODIPINE BESYLATE 5 MG TA: 5 | 30 days supply | Qty: 30 | Fill #0

## 2018-04-02 ENCOUNTER — Encounter: Payer: Self-pay | Admitting: Nurse Practitioner

## 2018-04-02 ENCOUNTER — Ambulatory Visit: Payer: Medicare Other | Attending: Nurse Practitioner | Admitting: Nurse Practitioner

## 2018-04-02 VITALS — BP 161/75 | HR 67 | Temp 98.0°F | Resp 16 | Ht 65.0 in | Wt 141.0 lb

## 2018-04-02 DIAGNOSIS — R0981 Nasal congestion: Secondary | ICD-10-CM

## 2018-04-02 DIAGNOSIS — I10 Essential (primary) hypertension: Secondary | ICD-10-CM

## 2018-04-02 DIAGNOSIS — I252 Old myocardial infarction: Secondary | ICD-10-CM | POA: Insufficient documentation

## 2018-04-02 DIAGNOSIS — Z794 Long term (current) use of insulin: Secondary | ICD-10-CM | POA: Insufficient documentation

## 2018-04-02 DIAGNOSIS — Z7982 Long term (current) use of aspirin: Secondary | ICD-10-CM | POA: Insufficient documentation

## 2018-04-02 DIAGNOSIS — J069 Acute upper respiratory infection, unspecified: Secondary | ICD-10-CM | POA: Insufficient documentation

## 2018-04-02 DIAGNOSIS — Z79899 Other long term (current) drug therapy: Secondary | ICD-10-CM | POA: Diagnosis not present

## 2018-04-02 DIAGNOSIS — Z791 Long term (current) use of non-steroidal anti-inflammatories (NSAID): Secondary | ICD-10-CM | POA: Diagnosis not present

## 2018-04-02 DIAGNOSIS — Z951 Presence of aortocoronary bypass graft: Secondary | ICD-10-CM | POA: Diagnosis not present

## 2018-04-02 DIAGNOSIS — E782 Mixed hyperlipidemia: Secondary | ICD-10-CM | POA: Diagnosis not present

## 2018-04-02 DIAGNOSIS — Z833 Family history of diabetes mellitus: Secondary | ICD-10-CM | POA: Diagnosis not present

## 2018-04-02 DIAGNOSIS — N529 Male erectile dysfunction, unspecified: Secondary | ICD-10-CM | POA: Diagnosis not present

## 2018-04-02 DIAGNOSIS — I2510 Atherosclerotic heart disease of native coronary artery without angina pectoris: Secondary | ICD-10-CM

## 2018-04-02 DIAGNOSIS — Z888 Allergy status to other drugs, medicaments and biological substances status: Secondary | ICD-10-CM | POA: Insufficient documentation

## 2018-04-02 DIAGNOSIS — E1165 Type 2 diabetes mellitus with hyperglycemia: Secondary | ICD-10-CM

## 2018-04-02 LAB — POCT GLYCOSYLATED HEMOGLOBIN (HGB A1C): HbA1c, POC (controlled diabetic range): 9.3 % — AB (ref 0.0–7.0)

## 2018-04-02 LAB — GLUCOSE, POCT (MANUAL RESULT ENTRY): POC Glucose: 274 mg/dl — AB (ref 70–99)

## 2018-04-02 MED ORDER — ASPIRIN 81 MG PO TABS: 81.0000 mg | ORAL_TABLET | Freq: Every day | ORAL | 12 refills | Status: DC

## 2018-04-02 MED ORDER — LOSARTAN POTASSIUM 100 MG PO TABS: 100.0000 mg | ORAL_TABLET | Freq: Every day | ORAL | 0 refills | Status: DC

## 2018-04-02 MED ORDER — INSULIN DETEMIR 100 UNIT/ML FLEXPEN: 25.0000 [IU] | PEN_INJECTOR | Freq: Every day | SUBCUTANEOUS | 2 refills | Status: DC

## 2018-04-02 MED ORDER — ATORVASTATIN CALCIUM 80 MG PO TABS: ORAL_TABLET | ORAL | 1 refills | Status: DC

## 2018-04-02 MED ORDER — TRUE METRIX METER W/DEVICE KIT: PACK | 0 refills | Status: DC

## 2018-04-02 MED ORDER — FLUTICASONE PROPIONATE 50 MCG/ACT NA SUSP: 2.0000 | Freq: Every day | NASAL | 6 refills | Status: DC

## 2018-04-02 MED ORDER — AMLODIPINE BESYLATE 5 MG PO TABS: 5.0000 mg | ORAL_TABLET | Freq: Every day | ORAL | 3 refills | Status: DC

## 2018-04-02 MED ORDER — METFORMIN HCL 1000 MG PO TABS: 1000.0000 mg | ORAL_TABLET | Freq: Two times a day (BID) | ORAL | 3 refills | Status: DC

## 2018-04-02 MED ORDER — ACCU-CHEK SOFTCLIX LANCETS MISC: 12 refills | Status: DC

## 2018-04-02 MED ORDER — EMPAGLIFLOZIN 25 MG PO TABS: 25.0000 mg | ORAL_TABLET | Freq: Every day | ORAL | 0 refills | Status: DC

## 2018-04-02 MED ORDER — GLUCOSE BLOOD VI STRP: ORAL_STRIP | 12 refills | Status: DC

## 2018-04-02 MED FILL — JARDIANCE 25 MG TABLET: 25 | 30 days supply | Qty: 30 | Fill #0

## 2018-04-02 MED FILL — FLUTICASONE PROP 50 MCG SPR: 50 | 30 days supply | Qty: 16 | Fill #0

## 2018-04-02 MED FILL — LOSARTAN POTASSIUM 100 MG T: 100 | 90 days supply | Qty: 90 | Fill #0

## 2018-04-02 MED FILL — LEVEMIR FLEXTOUCH 100 UNITS: 100 | 24 days supply | Qty: 6 | Fill #0

## 2018-04-02 NOTE — Progress Notes (Signed)
Assessment & Plan:  Mareo was seen today for hypertension, diabetes and uri.  Diagnoses and all orders for this visit:  Uncontrolled type 2 diabetes mellitus with hyperglycemia (HCC) -     POCT glucose (manual entry) -     POCT glycosylated hemoglobin (Hb A1C) -     Ambulatory referral to Ophthalmology -     CMP14+EGFR -     ACCU-CHEK SOFTCLIX LANCETS lancets; Use as instructed to check blood sugar 3 times weekly before breakfast. E11.9 -     Blood Glucose Monitoring Suppl (TRUE METRIX METER) w/Device KIT; Use as instructed -     Insulin Detemir (LEVEMIR FLEXTOUCH) 100 UNIT/ML Pen; Inject 25 Units into the skin daily. -     metFORMIN (GLUCOPHAGE) 1000 MG tablet; Take 1 tablet (1,000 mg total) by mouth 2 (two) times daily with a meal. -     glucose blood (ACCU-CHEK AVIVA PLUS) test strip; Use as instructed to check blood sugar 3 times weekly before breakfast. E11.9 -     empagliflozin (JARDIANCE) 25 MG TABS tablet; Take 25 mg by mouth daily. Diabetes is poorly controlled. Advised patient to keep a fasting blood sugar log fast, 2 hours post lunch and bedtime which will be reviewed at the next office visit.  Continue blood sugar control as discussed in office today, low carbohydrate diet, and regular physical exercise as tolerated, 150 minutes per week (30 min each day, 5 days per week, or 50 min 3 days per week). Keep blood sugar logs with fasting goal of 90-130 mg/dl, post prandial (after you eat) less than 180.  For Hypoglycemia: BS <60 and Hyperglycemia BS >400; contact the clinic ASAP. Annual eye exams and foot exams are recommended.   Mixed hyperlipidemia -     atorvastatin (LIPITOR) 80 MG tablet; TAKE 1 TABLET BY MOUTH ONCE DAILY AT 6 PM. INSTRUCTIONS: Work on a low fat, heart healthy diet and participate in regular aerobic exercise program by working out at least 150 minutes per week; 5 days a week-30 minutes per day. Avoid red meat, fried foods. junk foods, sodas, sugary drinks,  unhealthy snacking, alcohol and smoking.  Drink at least 48oz of water per day and monitor your carbohydrate intake daily.    Essential hypertension -     losartan (COZAAR) 100 MG tablet; Take 1 tablet (100 mg total) by mouth daily. -     amLODipine (NORVASC) 5 MG tablet; Take 1 tablet (5 mg total) by mouth daily. Continue all antihypertensives as prescribed.  Remember to bring in your blood pressure log with you for your follow up appointment.  DASH/Mediterranean Diets are healthier choices for HTN.   Coronary artery disease involving native coronary artery of native heart without angina pectoris -     aspirin 81 MG tablet; Take 1 tablet (81 mg total) by mouth daily.  Nasal congestion -     fluticasone (FLONASE) 50 MCG/ACT nasal spray; Place 2 sprays into both nostrils daily.       Patient has been counseled on age-appropriate routine health concerns for screening and prevention. These are reviewed and up-to-date. Referrals have been placed accordingly. Immunizations are up-to-date or declined.    Subjective:   Chief Complaint  Patient presents with  . Hypertension  . Diabetes  . URI   HPI Carl Clark 68 y.o. male presents to office today for follow up to DM and with continued complaints of persistent ED. He was tried on viagra and states it did not relieve  his symptoms. I have instructed him that he has many chronic health conditions including HTN (which is not always controlled), DM (poorly controlled), CAD and history of MI. All of these factors are contributing to his erectile dysfunction. I am reluctant to try him on tadalafil as he does report some intermittent chest discomfort. Will refer to Urology for further recommendations.  Metoprolol was decreased to 12.65m BID and amlodipine 514mwas added in December. Unfortunately I can not accurately assess effectiveness as he did not take his blood pressure medications today.    DM type 2 He has not been checking  his blood glucose levels due to running out of strips and not requesting any additional ones. A1c still not well controlled although decreased from 12.3 to 9.3 today. He endorses average readings 130-140s fasting and 180-190 postprandial. He denies any hypo or hyperglycemic symptoms. He is overdue for eye exam. Will increase jardiance from 10 to 2543mHe had an allergic reaction to victoza so will not be able to take. Other medications currently include levemir 25units nightly and metformin 1000 mg BID.  Lab Results  Component Value Date   HGBA1C 9.3 (A) 04/02/2018    Essential Hypertension  Chronic and not well controlled today. He did not take his blood pressure medications this morning. He does not have a reason as to why he did not take his blood pressure medication today. I will have the nurse go over all his medications with him today before he leaves to ensure there are no discrepancies with medication management. He currently denies chest pain, shortness of breath, palpitations, lightheadedness, dizziness, headaches or BLE edema. I have explained to him that it if difficult to manage his HTN if he is not taking the medications that he has been prescribed.  BP Readings from Last 3 Encounters:  04/02/18 (!) 161/75  03/13/18 140/80  12/26/17 (!) 147/82   Hyperlipidemia Patient presents for follow up to hyperlipidemia.  He is medication compliant. He is not diet compliant and denies statin intolerance including myalgias. LDL at goal.  Lab Results  Component Value Date   CHOL 96 (L) 12/26/2017   Lab Results  Component Value Date   HDL 35 (L) 12/26/2017   Lab Results  Component Value Date   LDLCALC 45 12/26/2017   Lab Results  Component Value Date   TRIG 81 12/26/2017   Lab Results  Component Value Date   CHOLHDL 2.7 12/26/2017   Review of Systems  Constitutional: Negative for fever, malaise/fatigue and weight loss.  HENT: Positive for congestion. Negative for nosebleeds, sinus  pain and sore throat.   Eyes: Negative.  Negative for blurred vision, double vision and photophobia.  Respiratory: Positive for cough. Negative for shortness of breath.   Cardiovascular: Negative.  Negative for chest pain, palpitations and leg swelling.  Gastrointestinal: Negative.  Negative for heartburn, nausea and vomiting.  Musculoskeletal: Negative.  Negative for myalgias.  Neurological: Negative.  Negative for dizziness, focal weakness, seizures and headaches.  Psychiatric/Behavioral: Negative.  Negative for suicidal ideas.    Past Medical History:  Diagnosis Date  . CAD (coronary artery disease) 03/16/2017   S/p NSTEMI 6/18 >> LHC: multivessel CAD >> s/p CABG // Echo 6/18: Mild LVH, EF 60-65, normal wall motion, grade 1 diastolic dysfunction, normal RV SF, PASP 36 (borderline pulmonary hypertension) // Pre-CABG Dopplers 09/13/16: Bilateral ICA 1-39  . Family history of anesthesia complication    "never had anesthesia" (07/14/2012)  . History of non-ST elevation myocardial infarction (NSTEMI) 09/12/2016  Treated with CABG  . History of rectal bleeding    secondary to diverticulum affecting right hepatic flexure - 06/2012  . Hypertension   . Mild carotid artery disease (HCC)    a. 1-39% bilaterally preCABG in 2018.  . Type II diabetes mellitus ([place])     Past Surgical History:  Procedure Laterality Date  . COLONOSCOPY Left 07/14/2012   Procedure: COLONOSCOPY;  Surgeon: Arta Silence, MD;  Location: [facility] ENDOSCOPY;  Service: Endoscopy;  Laterality: Left;  . CORONARY ARTERY BYPASS GRAFT N/A 09/15/2016   Procedure: CORONARY ARTERY BYPASS GRAFTING (CABG) x 3, LIMA to LAD, SVG to DIAGONAL, SVG to OM2, USING LEFT MAMMARY ARTERY AND RIGHT GREATER SAPHENOUS VEIN HARVESTED ENDOSCOPICALLY;  Surgeon: Grace Isaac, MD;  Location: [place];  Service: Open Heart Surgery;  Laterality: N/A;  . ESOPHAGOGASTRODUODENOSCOPY N/A 07/13/2012   Procedure: ESOPHAGOGASTRODUODENOSCOPY (EGD);  Surgeon: Arta Silence, MD;  Location: [facility] ENDOSCOPY;  Service: Endoscopy;  Laterality: N/A;  pt in ED A8  . LEFT HEART CATH AND CORONARY ANGIOGRAPHY N/A 09/12/2016   Procedure: Left Heart Cath and Coronary Angiography;  Surgeon: Sherren Mocha, MD;  Location: [place] CV LAB;  Service: Cardiovascular;  Laterality: N/A;  . NO PAST SURGERIES    . TEE WITHOUT CARDIOVERSION N/A 09/15/2016   Procedure: TRANSESOPHAGEAL ECHOCARDIOGRAM (TEE);  Surgeon: Grace Isaac, MD;  Location: [place];  Service: Open Heart Surgery;  Laterality: N/A;    Family History  Problem Relation Age of Onset  . Diabetes Mother   . Diabetes Father     Social History Reviewed with no changes to be made today.   Outpatient Medications Prior to Visit  Medication Sig Dispense Refill  . Insulin Pen Needle (TRUEPLUS PEN NEEDLES) 32G X 4 MM MISC Use as directed to inject. 100 each 11  . meloxicam (MOBIC) 15 MG tablet Take 15 mg by mouth daily.  2  . metoprolol tartrate (LOPRESSOR) 25 MG tablet Take 0.5 tablets (12.5 mg total) by mouth 2 (two) times daily. 90 tablet 3  . Misc. Devices MISC Please provide patient with an insurance approved QUAD CANE 1 each 0  . triamcinolone cream (KENALOG) 0.1 % Apply 1 application topically 2 (two) times daily. (Patient not taking: Reported on 04/02/2018) 30 g 0  . ACCU-CHEK SOFTCLIX LANCETS lancets Use as instructed to check blood sugar 3 times weekly before breakfast. E11.9 100 each 12  . amLODipine (NORVASC) 5 MG tablet Take 1 tablet (5 mg total) by mouth daily. 180 tablet 3  . aspirin 81 MG tablet Take 1 tablet (81 mg total) by mouth daily. 30 tablet 12  . atorvastatin (LIPITOR) 80 MG tablet TAKE 1 TABLET BY MOUTH ONCE DAILY AT 6 PM. 90 tablet 1  . Blood Glucose Monitoring Suppl (TRUE METRIX METER) w/Device KIT Use as instructed 1 kit 0  . empagliflozin (JARDIANCE) 10 MG TABS tablet Take 10 mg by mouth daily. 30 tablet 2  . glucose blood (ACCU-CHEK AVIVA PLUS) test strip Use as instructed to check  blood sugar 3 times weekly before breakfast. E11.9 100 each 12  . Insulin Detemir (LEVEMIR FLEXTOUCH) 100 UNIT/ML Pen Inject 25 Units into the skin daily. 15 mL 2  . losartan (COZAAR) 100 MG tablet Take 1 tablet (100 mg total) by mouth daily. 90 tablet 0  . metFORMIN (GLUCOPHAGE) 1000 MG tablet Take 1 tablet (1,000 mg total) by mouth 2 (two) times daily with a meal. 180 tablet 3  . naproxen (NAPROSYN) 500 MG tablet Take  1 tablet (500 mg total) by mouth 2 (two) times daily with a meal. 20 tablet 0  . sildenafil (VIAGRA) 25 MG tablet Take 1 tablet (25 mg total) by mouth daily as needed for erectile dysfunction. Do not take with nitroglycerin. Take 30 min. before sex 10 tablet 0   No facility-administered medications prior to visit.     Allergies  Allergen Reactions  . Victoza [Liraglutide] Swelling    Lip swelling, itching, rash       Objective:    BP (!) 161/75   Pulse 67   Temp 98 F (36.7 C) (Oral)   Resp 16   Ht '5\' 5"'  (1.651 m)   Wt 141 lb (64 kg)   SpO2 98%   BMI 23.46 kg/m  Wt Readings from Last 3 Encounters:  04/02/18 141 lb (64 kg)  03/13/18 137 lb 3.2 oz (62.2 kg)  12/26/17 139 lb 3.2 oz (63.1 kg)    Physical Exam Vitals signs and nursing note reviewed.  Constitutional:      Appearance: He is well-developed.  HENT:     Head: Normocephalic and atraumatic.  Neck:     Musculoskeletal: Normal range of motion.  Cardiovascular:     Rate and Rhythm: Normal rate and regular rhythm.     Heart sounds: Normal heart sounds. No murmur. No friction rub. No gallop.   Pulmonary:     Effort: Pulmonary effort is normal. No tachypnea or respiratory distress.     Breath sounds: Normal breath sounds. No decreased breath sounds, wheezing, rhonchi or rales.  Chest:     Chest wall: No tenderness.  Abdominal:     General: Bowel sounds are normal.     Palpations: Abdomen is soft.  Musculoskeletal: Normal range of motion.  Skin:    General: Skin is warm and dry.  Neurological:      Mental Status: He is alert and oriented to person, place, and time.     Coordination: Coordination normal.  Psychiatric:        Behavior: Behavior normal. Behavior is cooperative.        Thought Content: Thought content normal.        Judgment: Judgment normal.          Patient has been counseled extensively about nutrition and exercise as well as the importance of adherence with medications and regular follow-up. The patient was given clear instructions to go to ER or return to medical center if symptoms don't improve, worsen or new problems develop. The patient verbalized understanding.   Follow-up: Return in about 6 weeks (around 05/14/2018) for DM f/u with LUKE and bring meter.   Gildardo Pounds, FNP-BC [facility] [place], [place]   04/02/2018, 11:16 AM

## 2018-04-22 ENCOUNTER — Other Ambulatory Visit: Payer: Self-pay | Admitting: Physician Assistant

## 2018-04-22 DIAGNOSIS — E782 Mixed hyperlipidemia: Secondary | ICD-10-CM

## 2018-04-22 DIAGNOSIS — E1165 Type 2 diabetes mellitus with hyperglycemia: Secondary | ICD-10-CM

## 2018-05-09 ENCOUNTER — Other Ambulatory Visit: Payer: Self-pay | Admitting: Cardiovascular Disease

## 2018-05-09 ENCOUNTER — Other Ambulatory Visit: Payer: Self-pay | Admitting: Nurse Practitioner

## 2018-05-09 DIAGNOSIS — E782 Mixed hyperlipidemia: Secondary | ICD-10-CM

## 2018-05-09 DIAGNOSIS — E1165 Type 2 diabetes mellitus with hyperglycemia: Secondary | ICD-10-CM

## 2018-05-09 DIAGNOSIS — I10 Essential (primary) hypertension: Secondary | ICD-10-CM

## 2018-05-14 ENCOUNTER — Ambulatory Visit: Payer: Medicare Other | Attending: Nurse Practitioner | Admitting: Pharmacist

## 2018-05-14 ENCOUNTER — Encounter: Payer: Self-pay | Admitting: Pharmacist

## 2018-05-14 DIAGNOSIS — T383X6A Underdosing of insulin and oral hypoglycemic [antidiabetic] drugs, initial encounter: Secondary | ICD-10-CM | POA: Diagnosis not present

## 2018-05-14 DIAGNOSIS — Z833 Family history of diabetes mellitus: Secondary | ICD-10-CM | POA: Insufficient documentation

## 2018-05-14 DIAGNOSIS — E1165 Type 2 diabetes mellitus with hyperglycemia: Secondary | ICD-10-CM | POA: Insufficient documentation

## 2018-05-14 DIAGNOSIS — Z91128 Patient's intentional underdosing of medication regimen for other reason: Secondary | ICD-10-CM | POA: Insufficient documentation

## 2018-05-14 DIAGNOSIS — I2510 Atherosclerotic heart disease of native coronary artery without angina pectoris: Secondary | ICD-10-CM

## 2018-05-14 DIAGNOSIS — Z7984 Long term (current) use of oral hypoglycemic drugs: Secondary | ICD-10-CM | POA: Diagnosis not present

## 2018-05-14 DIAGNOSIS — Z79899 Other long term (current) drug therapy: Secondary | ICD-10-CM | POA: Insufficient documentation

## 2018-05-14 LAB — GLUCOSE, POCT (MANUAL RESULT ENTRY): POC Glucose: 151 mg/dl — AB (ref 70–99)

## 2018-05-14 MED ORDER — ACCU-CHEK FASTCLIX LANCETS MISC: 2 refills | Status: DC

## 2018-05-14 MED ORDER — GLUCOSE BLOOD VI STRP: ORAL_STRIP | 2 refills | Status: DC

## 2018-05-14 MED ORDER — ACCU-CHEK GUIDE ME W/DEVICE KIT: 1.0000 | PACK | Freq: Three times a day (TID) | 0 refills | Status: DC

## 2018-05-14 MED FILL — JARDIANCE 25 MG TABLET: 25 | 30 days supply | Qty: 30 | Fill #1

## 2018-05-14 MED FILL — metFORMIN HCL 1000 MG TABS: 1000 | 90 days supply | Qty: 180 | Fill #0

## 2018-05-14 NOTE — Progress Notes (Signed)
    S:    PCP: [name]  No chief complaint on file.  Patient arrives in good spirits.  Presents for diabetes management at the request of [name]. Patient was referred on 04/02/2018.  A1c at that visit resulted at 9.3 (down from 12.3).   Patient reports diabetes was diagnosed 20 years.   Family/Social History:  - FHx: DM (mother, father) - Tobacco: never smoker - Alcohol: use   Insurance coverage/medication affordability:  - [employer] Medicare  Patient denies adherence with medications.  Current diabetes medications include:  - Does not take medications when his blood sugar is less than 120  - Empagliflozin 25 mg daily - Levemir 25 units daily - Metformin 1000 mg BID  Patient denies hypoglycemic events.  Patient reported dietary habits:  - He does not limit carbs  - He reports drinking fruit juice  Patient-reported exercise habits:  - Plays soccer on the weekends - Main source of physical activity is through work    Patient denies polydipsia, polyuria.  Patient denies neuropathy. Patient denies visual changes. Patient reports self foot exams.   O:  POCT: 151 Not checking at home - reports being without test strips  Lab Results  Component Value Date   HGBA1C 9.3 (A) 04/02/2018   There were no vitals filed for this visit.  Lipid Panel     Component Value Date/Time   CHOL 96 (L) 12/26/2017 0922   TRIG 81 12/26/2017 0922   HDL 35 (L) 12/26/2017 0922   CHOLHDL 2.7 12/26/2017 0922   CHOLHDL 4.8 09/12/2016 0806   VLDL 29 09/12/2016 0806   LDLCALC 45 12/26/2017 0922   Clinical ASCVD: Yes - CAD, NSTEMI/CABG The ASCVD Risk score Denman George DC Jr., et al., 2013) failed to calculate for the following reasons:   The patient has a prior MI or stroke diagnosis   A/P: Diabetes longstanding currently uncontrolled. Patient is able to verbalize appropriate hypoglycemia management plan. Patient is not adherent with medication. Control is suboptimal due to medication non-compliance,  dietary indiscretion, and physical inactivity.   Pt to restart medications. Compliance encouraged - emphasized that he should continue his medications even if his BG at home is 120. Additionally, will not make any changes as pt is not checking BG at home. Will have him record readings and return in one month.   -Continued current medication.  -Provided with refills of test strips -Extensively discussed pathophysiology of DM, recommended lifestyle interventions, dietary effects on glycemic control -Counseled on s/sx of and management of hypoglycemia -Next A1C anticipated 06/2018.   ASCVD risk - secondary prevention in patient with DM. Last LDL is controlled. High intensity statin and ASA indicated at this time. -Continued aspirin 81 mg  -Continued atorvastatin 80 mg.   Written patient instructions provided.  Total time in face to face counseling 30 minutes.   Follow up Pharmacist Clinic Visit in 1 month.     Patient seen with: Arletha Pili, PharmD Candidate  [employer]  Class of 2022  Butch Penny, PharmD, CPP Clinical Pharmacist [facility] [phone]

## 2018-05-14 NOTE — Patient Instructions (Addendum)
Arabic translation:   shukraan lihudurikum liruyati alyawm. yrja alqiam bima yly:   1. mutabaeat metformin 1000mg : 1 qurs maratayn fi alyawm.  2. mutabaeat Jardiance 25 milgha: 1 qurs yawmiana.  3. tuasil mae Levemir 25 wahdat ywmyana. 4. muasalat altahaquq min alskriat fi aldam fi almunzil. laqad 'arsalat alwasafat altibiyat 'iilaa [facility] [address].  5. muasalat 'iijra' taghyirat namat alhayat alty naqashnaha maeaan khilal ziaratina. yaleab alhimyat waltamarin dwrana mhmana fi tahsin nisbat alsukar fi aldm.  6. almutabaeat maei fi 1 shuhrun.    English translation:  Thank you for coming to see me today. Please do the following:  1. Continue metformin 1000mg : 1 tablet twice a day.  2. Continue Jardiance 25 mg: 1 tablet daily.  3. Continue Levemir 25 units daily.  4. Continue checking blood sugars at home. I've sent prescriptions to the [facility] on [address].  5. Continue making the lifestyle changes we've discussed together during our visit. Diet and exercise play a significant role in improving your blood sugars.  6. Follow-up with me in 1 month.

## 2018-05-15 ENCOUNTER — Encounter (HOSPITAL_COMMUNITY): Payer: Self-pay

## 2018-05-15 ENCOUNTER — Other Ambulatory Visit: Payer: Self-pay

## 2018-05-15 ENCOUNTER — Emergency Department (HOSPITAL_COMMUNITY): Payer: Medicare Other

## 2018-05-15 ENCOUNTER — Emergency Department (HOSPITAL_COMMUNITY)
Admission: EM | Admit: 2018-05-15 | Discharge: 2018-05-15 | Disposition: A | Discharge status: Home / Self Care | Payer: Medicare Other | Attending: Emergency Medicine | Admitting: Emergency Medicine

## 2018-05-15 DIAGNOSIS — E1165 Type 2 diabetes mellitus with hyperglycemia: Secondary | ICD-10-CM | POA: Insufficient documentation

## 2018-05-15 DIAGNOSIS — J111 Influenza due to unidentified influenza virus with other respiratory manifestations: Secondary | ICD-10-CM | POA: Diagnosis not present

## 2018-05-15 DIAGNOSIS — I2510 Atherosclerotic heart disease of native coronary artery without angina pectoris: Secondary | ICD-10-CM | POA: Insufficient documentation

## 2018-05-15 DIAGNOSIS — I10 Essential (primary) hypertension: Secondary | ICD-10-CM | POA: Diagnosis not present

## 2018-05-15 DIAGNOSIS — Z7982 Long term (current) use of aspirin: Secondary | ICD-10-CM | POA: Diagnosis not present

## 2018-05-15 DIAGNOSIS — Z951 Presence of aortocoronary bypass graft: Secondary | ICD-10-CM | POA: Diagnosis not present

## 2018-05-15 DIAGNOSIS — Z794 Long term (current) use of insulin: Secondary | ICD-10-CM | POA: Insufficient documentation

## 2018-05-15 DIAGNOSIS — R05 Cough: Secondary | ICD-10-CM | POA: Diagnosis not present

## 2018-05-15 DIAGNOSIS — R0789 Other chest pain: Secondary | ICD-10-CM | POA: Diagnosis not present

## 2018-05-15 DIAGNOSIS — R6889 Other general symptoms and signs: Secondary | ICD-10-CM | POA: Insufficient documentation

## 2018-05-15 DIAGNOSIS — Z79899 Other long term (current) drug therapy: Secondary | ICD-10-CM | POA: Diagnosis not present

## 2018-05-15 LAB — CBC
HCT: 39.3 % (ref 39.0–52.0)
Hemoglobin: 12.3 g/dL — ABNORMAL LOW (ref 13.0–17.0)
MCH: 30.4 pg (ref 26.0–34.0)
MCHC: 31.3 g/dL (ref 30.0–36.0)
MCV: 97 fL (ref 80.0–100.0)
NRBC: 0 % (ref 0.0–0.2)
Platelets: 286 10*3/uL (ref 150–400)
RBC: 4.05 MIL/uL — ABNORMAL LOW (ref 4.22–5.81)
RDW: 12.6 % (ref 11.5–15.5)
WBC: 7.3 10*3/uL (ref 4.0–10.5)

## 2018-05-15 LAB — BASIC METABOLIC PANEL
Anion gap: 8 (ref 5–15)
BUN: 20 mg/dL (ref 8–23)
CALCIUM: 9.5 mg/dL (ref 8.9–10.3)
CO2: 25 mmol/L (ref 22–32)
Chloride: 106 mmol/L (ref 98–111)
Creatinine, Ser: 0.96 mg/dL (ref 0.61–1.24)
GFR calc non Af Amer: >60 mL/min (ref >60)
Glucose, Bld: 86 mg/dL (ref 70–99)
Potassium: 3.4 mmol/L — ABNORMAL LOW (ref 3.5–5.1)
SODIUM: 139 mmol/L (ref 135–145)

## 2018-05-15 LAB — I-STAT TROPONIN, ED: Troponin i, poc: 0.02 ng/mL (ref 0.00–0.08)

## 2018-05-15 MED ORDER — OSELTAMIVIR PHOSPHATE 75 MG PO CAPS: 75.0000 mg | ORAL_CAPSULE | Freq: Two times a day (BID) | ORAL | 0 refills | Status: DC

## 2018-05-15 NOTE — ED Provider Notes (Signed)
[place] EMERGENCY DEPARTMENT Provider Note   CSN: [id number] Arrival date & time: 05/15/18  0813    History   Chief Complaint Chief Complaint  Patient presents with  . Chest Pain    HPI Carl Clark is a 68 y.o. male who presents with chest pain and cough. PMH significant for CAD s/p CABG, NSTEMI, insulin dependent DM, HTN. A friend is at bedside who helps interpret. He states that he started to have a fever, chills, runny nose, and coughing last night. He reports associated intermittent substernal chest pain. He thinks he has the flu. He doesn't have any sick contacts. He has not traveled recently.  Nothing makes it better or worse.  He denies SOB, constant exertional chest pain, abdominal pain, N/V, leg swelling. It does not feel like prior MI.     HPI  Past Medical History:  Diagnosis Date  . CAD (coronary artery disease) 03/16/2017   S/p NSTEMI 6/18 >> LHC: multivessel CAD >> s/p CABG // Echo 6/18: Mild LVH, EF 60-65, normal wall motion, grade 1 diastolic dysfunction, normal RV SF, PASP 36 (borderline pulmonary hypertension) // Pre-CABG Dopplers 09/13/16: Bilateral ICA 1-39  . Family history of anesthesia complication    "never had anesthesia" (07/14/2012)  . History of non-ST elevation myocardial infarction (NSTEMI) 09/12/2016   Treated with CABG  . History of rectal bleeding    secondary to diverticulum affecting right hepatic flexure - 06/2012  . Hypertension   . Mild carotid artery disease (HCC)    a. 1-39% bilaterally preCABG in 2018.  . Type II diabetes mellitus [facility])     Patient Active Problem List   Diagnosis Date Noted  . Uncontrolled type 2 diabetes mellitus with hyperglycemia ([place]) 06/15/2017  . Bilateral knee pain 06/12/2017  . CAD (coronary artery disease) 03/16/2017  . Hyperlipidemia 03/16/2017  . 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 History of non-ST elevation myocardial infarction (NSTEMI) 09/12/2016  . Rectal bleeding 07/13/2012    . Diabetes mellitus ([place]) 07/13/2012  . Essential hypertension 07/13/2012    Past Surgical History:  Procedure Laterality Date  . COLONOSCOPY Left 07/14/2012   Procedure: COLONOSCOPY;  Surgeon: Arta Silence, MD;  Location: [facility] ENDOSCOPY;  Service: Endoscopy;  Laterality: Left;  . CORONARY ARTERY BYPASS GRAFT N/A 09/15/2016   Procedure: CORONARY ARTERY BYPASS GRAFTING (CABG) x 3, LIMA to LAD, SVG to DIAGONAL, SVG to OM2, USING LEFT MAMMARY ARTERY AND RIGHT GREATER SAPHENOUS VEIN HARVESTED ENDOSCOPICALLY;  Surgeon: Grace Isaac, MD;  Location: [place];  Service: Open Heart Surgery;  Laterality: N/A;  . ESOPHAGOGASTRODUODENOSCOPY N/A 07/13/2012   Procedure: ESOPHAGOGASTRODUODENOSCOPY (EGD);  Surgeon: Arta Silence, MD;  Location: [facility] ENDOSCOPY;  Service: Endoscopy;  Laterality: N/A;  pt in ED A8  . LEFT HEART CATH AND CORONARY ANGIOGRAPHY N/A 09/12/2016   Procedure: Left Heart Cath and Coronary Angiography;  Surgeon: Sherren Mocha, MD;  Location: [place] CV LAB;  Service: Cardiovascular;  Laterality: N/A;  . NO PAST SURGERIES    . TEE WITHOUT CARDIOVERSION N/A 09/15/2016   Procedure: TRANSESOPHAGEAL ECHOCARDIOGRAM (TEE);  Surgeon: Grace Isaac, MD;  Location: [place];  Service: Open Heart Surgery;  Laterality: N/A;        Home Medications    Prior to Admission medications   Medication Sig Start Date End Date Taking? Authorizing Provider  ACCU-CHEK FASTCLIX LANCETS MISC Use as instructed to check blood sugar 3 times weekly before breakfast. E11.9 05/14/18   Gildardo Pounds, NP  amLODipine (  NORVASC) 5 MG tablet Take 1 tablet (5 mg total) by mouth daily. 04/02/18 07/01/18  Gildardo Pounds, NP  aspirin 81 MG tablet Take 1 tablet (81 mg total) by mouth daily. 04/02/18   Gildardo Pounds, NP  atorvastatin (LIPITOR) 80 MG tablet TAKE 1 TABLET BY MOUTH ONCE DAILY AT  6PM 05/11/18   Charlott Rakes, MD  Blood Glucose Monitoring Suppl (ACCU-CHEK GUIDE ME) w/Device KIT 1 kit by Does not  apply route 3 (three) times daily. Use to check blood sugar 3 times daily. E11.9 05/14/18   Gildardo Pounds, NP  empagliflozin (JARDIANCE) 25 MG TABS tablet Take 25 mg by mouth daily. 04/02/18 07/01/18  Gildardo Pounds, NP  fluticasone (FLONASE) 50 MCG/ACT nasal spray Place 2 sprays into both nostrils daily. 04/02/18   Gildardo Pounds, NP  glucose blood (ACCU-CHEK GUIDE) test strip Use as instructed to check blood sugar 3 times weekly before breakfast. E11.9 05/14/18   Gildardo Pounds, NP  Insulin Detemir (LEVEMIR FLEXTOUCH) 100 UNIT/ML Pen Inject 25 Units into the skin daily. 04/02/18   Gildardo Pounds, NP  Insulin Pen Needle (TRUEPLUS PEN NEEDLES) 32G X 4 MM MISC Use as directed to inject. 02/06/18   Charlott Rakes, MD  losartan (COZAAR) 100 MG tablet Take 1 tablet (100 mg total) by mouth daily. 04/02/18 07/01/18  Gildardo Pounds, NP  metFORMIN (GLUCOPHAGE) 1000 MG tablet Take 1 tablet (1,000 mg total) by mouth 2 (two) times daily with a meal. 04/02/18   Gildardo Pounds, NP  metoprolol tartrate (LOPRESSOR) 25 MG tablet Take 0.5 tablets (12.5 mg total) by mouth 2 (two) times daily. 03/13/18 06/11/18  Charlie Pitter, PA-C  Misc. Devices MISC Please provide patient with an insurance approved QUAD CANE 08/10/17   Gildardo Pounds, NP    Family History Family History  Problem Relation Age of Onset  . Diabetes Mother   . Diabetes Father     Social History Social History   Tobacco Use  . Smoking status: Never Smoker  . Smokeless tobacco: Never Used  Substance Use Topics  . Alcohol use: No  . Drug use: No     Allergies   Victoza [liraglutide]   Review of Systems Review of Systems  Constitutional: Positive for fever.  HENT: Positive for rhinorrhea.   Respiratory: Positive for cough. Negative for shortness of breath.   Cardiovascular: Positive for chest pain. Negative for leg swelling.  Gastrointestinal: Negative for abdominal pain, nausea and vomiting.  Neurological: Negative for  light-headedness.  All other systems reviewed and are negative.    Physical Exam Updated Vital Signs BP (!) 162/77 (BP Location: Right Arm)   Pulse 75   Temp 99 F (37.2 C) (Oral)   Resp 16   Ht _0  (1.651 m)   Wt 65.8 kg   SpO2 100%   BMI 24.13 kg/m   Physical Exam Vitals signs and nursing note reviewed.  Constitutional:      General: He is not in acute distress.    Appearance: He is well-developed. He is not ill-appearing.     Comments: Calm and cooperative  HENT:     Head: Normocephalic and atraumatic.     Right Ear: Tympanic membrane normal.     Left Ear: Tympanic membrane normal.     Nose: Rhinorrhea present.     Mouth/Throat:     Lips: Pink.     Mouth: Mucous membranes are moist.     Tongue: No lesions.  Pharynx: Oropharynx is clear.  Eyes:     General: No scleral icterus.       Right eye: No discharge.        Left eye: No discharge.     Conjunctiva/sclera: Conjunctivae normal.     Pupils: Pupils are equal, round, and reactive to light.  Neck:     Musculoskeletal: Normal range of motion.  Cardiovascular:     Rate and Rhythm: Normal rate and regular rhythm.  Pulmonary:     Effort: Pulmonary effort is normal. No respiratory distress.     Breath sounds: Normal breath sounds.     Comments: Occasional coughing Abdominal:     General: There is no distension.     Palpations: Abdomen is soft.     Tenderness: There is no abdominal tenderness.  Skin:    General: Skin is warm and dry.  Neurological:     Mental Status: He is alert and oriented to person, place, and time.  Psychiatric:        Behavior: Behavior normal.      ED Treatments / Results  Labs (all labs ordered are listed, but only abnormal results are displayed) Labs Reviewed  BASIC METABOLIC PANEL - Abnormal; Notable for the following components:      Result Value   Potassium 3.4 (*)    All other components within normal limits  CBC - Abnormal; Notable for the following components:    RBC 4.05 (*)    Hemoglobin 12.3 (*)    All other components within normal limits  I-STAT TROPONIN, ED    EKG EKG Interpretation  Date/Time:  Tuesday May 15 2018 08:39:58 EST Ventricular Rate:  74 PR Interval:    QRS Duration: 83 QT Interval:  340 QTC Calculation: 378 R Axis:   67 Text Interpretation:  Sinus rhythm Borderline short PR interval Biatrial enlargement similar to prior from 06/18/17 Confirmed by Virgel Manifold [phone]) on 05/15/2018 8:52:35 AM Also confirmed by Virgel Manifold [phone]), editor Philomena Doheny [phone])  on 05/15/2018 10:50:24 AM   Radiology Dg Chest 2 View  Result Date: 05/15/2018 CLINICAL DATA:  Cough, fever and chest congestion for the past 2 days. EXAM: CHEST - 2 VIEW COMPARISON:  04/12/2017. FINDINGS: Normal sized heart. Clear lungs. Stable post CABG changes. Mild thoracic spine degenerative changes. IMPRESSION: No acute abnormality. Electronically Signed   By: Claudie Revering M.D.   On: 05/15/2018 09:42    Procedures Procedures (including critical care time)  Medications Ordered in ED Medications - No data to display   Initial Impression / Assessment and Plan / ED Course  I have reviewed the triage vital signs and the nursing notes.  Pertinent labs & imaging results that were available during my care of the patient were reviewed by me and considered in my medical decision making (see chart for details).  68 year old male presents with flu like symptoms and chest pain.  He is hypertensive but otherwise vital signs are normal.  Heart is regular rate and rhythm, lungs are clear to auscultation.  Abdomen is soft and nontender.  He has copious nasal discharge.  EKG is sinus rhythm.  Chest x-ray is negative.  Labs are unremarkable other than mild hypokalemia.  Initial troponin is 0.02.  Pain is been ongoing since 9:00 PM last night therefore will not repeat troponin.  Will we will treat him empirically for the flu with Tamiflu.  Advised rest, antipyretics,  fluids and return if worsening.  Work note was given  Final Clinical Impressions(s) /  ED Diagnoses   Final diagnoses:  Flu-like symptoms  Atypical chest pain    ED Discharge Orders    None       Iris Pert 05/15/18 1602    Virgel Manifold, MD 05/20/18 [phone]

## 2018-05-15 NOTE — ED Notes (Signed)
Patient verbalizes understanding of discharge instructions. Opportunity for questioning and answers were provided. Armband removed by staff, pt discharged from ED home via POV with family. 

## 2018-05-15 NOTE — ED Notes (Signed)
Got patient undress on the monitor did ekg shown to er doctor patient is resting with nurse at bedside 

## 2018-05-15 NOTE — ED Triage Notes (Signed)
Pt from home; c/o intermittent central cp that began last night, along w/ productive cough and fever

## 2018-05-15 NOTE — Discharge Instructions (Addendum)
Please drink plenty of fluids.  Give Tylenol or Motrin for pain/fever Take Tamiflu twice a day for 5 days Return to the ED for worsening symptoms

## 2018-05-17 ENCOUNTER — Other Ambulatory Visit: Payer: Self-pay | Admitting: Nurse Practitioner

## 2018-05-17 DIAGNOSIS — I10 Essential (primary) hypertension: Secondary | ICD-10-CM

## 2018-05-18 MED ORDER — LOSARTAN POTASSIUM 100 MG PO TABS: 100.0000 mg | ORAL_TABLET | Freq: Every day | ORAL | 0 refills | Status: DC

## 2018-06-12 ENCOUNTER — Other Ambulatory Visit: Payer: Self-pay

## 2018-06-12 ENCOUNTER — Ambulatory Visit: Payer: Medicare Other | Attending: Family Medicine | Admitting: Pharmacist

## 2018-06-12 DIAGNOSIS — E1165 Type 2 diabetes mellitus with hyperglycemia: Secondary | ICD-10-CM | POA: Diagnosis not present

## 2018-06-12 DIAGNOSIS — I10 Essential (primary) hypertension: Secondary | ICD-10-CM

## 2018-06-12 DIAGNOSIS — I2510 Atherosclerotic heart disease of native coronary artery without angina pectoris: Secondary | ICD-10-CM

## 2018-06-12 DIAGNOSIS — E782 Mixed hyperlipidemia: Secondary | ICD-10-CM

## 2018-06-12 DIAGNOSIS — Z7984 Long term (current) use of oral hypoglycemic drugs: Secondary | ICD-10-CM | POA: Insufficient documentation

## 2018-06-12 DIAGNOSIS — Z833 Family history of diabetes mellitus: Secondary | ICD-10-CM | POA: Diagnosis not present

## 2018-06-12 LAB — GLUCOSE, POCT (MANUAL RESULT ENTRY): POC Glucose: 140 mg/dl — AB (ref 70–99)

## 2018-06-12 MED ORDER — LOSARTAN POTASSIUM 100 MG PO TABS: 100.0000 mg | ORAL_TABLET | Freq: Every day | ORAL | 0 refills | Status: DC

## 2018-06-12 MED ORDER — INSULIN DETEMIR 100 UNIT/ML FLEXPEN: 13.0000 [IU] | PEN_INJECTOR | Freq: Two times a day (BID) | SUBCUTANEOUS | 0 refills | Status: DC

## 2018-06-12 MED ORDER — METFORMIN HCL 1000 MG PO TABS: 1000.0000 mg | ORAL_TABLET | Freq: Two times a day (BID) | ORAL | 0 refills | Status: DC

## 2018-06-12 MED ORDER — METOPROLOL TARTRATE 25 MG PO TABS: 12.5000 mg | ORAL_TABLET | Freq: Two times a day (BID) | ORAL | 0 refills | Status: DC

## 2018-06-12 MED ORDER — AMLODIPINE BESYLATE 5 MG PO TABS: 5.0000 mg | ORAL_TABLET | Freq: Every day | ORAL | 0 refills | Status: DC

## 2018-06-12 MED ORDER — EMPAGLIFLOZIN 25 MG PO TABS: 25.0000 mg | ORAL_TABLET | Freq: Every day | ORAL | 0 refills | Status: DC

## 2018-06-12 MED ORDER — ATORVASTATIN CALCIUM 80 MG PO TABS: ORAL_TABLET | ORAL | 0 refills | Status: DC

## 2018-06-12 NOTE — Patient Instructions (Signed)
shukraan lihudurak liruyati alyawm. yrja alqiam bima yly:   1. aibda bitanawul Levemir maratayn fi alyawmi. khudh 13 wahdatan maratan fi alsabah wamarat ??fi almasa'.  2. tuasil almaytafawrmin maratayn fi alyawm.  3. tuasil jardyans marat wahidat fi alyawm.  4. aistamar fi fahs sakuriaat aldami fi almanzil.  5. aistamara fi 'iijra' taghyirat fi namat alhayat alty naqashnaha meana 'athna' ziaratina. yaleab alnizam alghadhayiya wamumarasat alriyadat dwrana mhmana fi tahsin nisbat alsukar fi aldm.  6. tabie mae tabibik alshahr almuqbil.  English translation: Thank you for coming to see me today. Please do the following:  1. Start taking Levemir twice a day. Take 13 units once in the morning and once in the evening.  2. Continue metformin twice a day.  3. Continue Jardiance once a day.  4. Continue checking blood sugars at home.  5. Continue making the lifestyle changes we've discussed together during our visit. Diet and exercise play a significant role in improving your blood sugars.  6. Follow-up with your doctor next month.

## 2018-06-12 NOTE — Progress Notes (Signed)
    S:    PCP: [name]  No chief complaint on file.  Patient arrives in good spirits.  Presents for diabetes management at the request of [name]. Patient was referred on 04/02/2018.  A1c at that visit resulted at 9.3 (down from 12.3). I last saw him 05/14/18 - continued medications as pt reported non-adherence.   Patient reports diabetes was diagnosed 20 years.   Family/Social History:  - FHx: DM (mother, father) - Tobacco: never smoker - Alcohol: reports occasional use   Insurance coverage/medication affordability:  - [employer] Medicare  Patient denies adherence with medications.  Current diabetes medications include:  - Empagliflozin 25 mg daily - Levemir 25 units daily (ran out yesterday - taking 20 units) - Metformin 1000 mg BID  Patient denies hypoglycemic events.  Patient reported dietary habits:  - He does not limit carbs  - He reports drinking fruit juice  Patient-reported exercise habits:  - Plays soccer on the weekends - Main source of physical activity is through work    Patient reports polyuria. Denies polydipsia.  Patient denies neuropathy. Patient denies visual changes. Patient reports self foot exams.   O:  POCT: 140 Home fasting CBG: 82 - 152 Home post-prandial:  118 - 356  Lab Results  Component Value Date   HGBA1C 9.3 (A) 04/02/2018   There were no vitals filed for this visit.  Lipid Panel     Component Value Date/Time   CHOL 96 (L) 12/26/2017 0922   TRIG 81 12/26/2017 0922   HDL 35 (L) 12/26/2017 0922   CHOLHDL 2.7 12/26/2017 0922   CHOLHDL 4.8 09/12/2016 0806   VLDL 29 09/12/2016 0806   LDLCALC 45 12/26/2017 0922   Clinical ASCVD: Yes - CAD, NSTEMI/CABG The ASCVD Risk score Denman George DC Jr., et al., 2013) failed to calculate for the following reasons:   The patient has a prior MI or stroke diagnosis   A/P: Diabetes longstanding currently uncontrolled. Patient is able to verbalize appropriate hypoglycemia management plan. Patient is not  adherent with medication. Control is suboptimal due to medication non-compliance, dietary indiscretion, and physical inactivity.   Pt needs better post-prandial coverage. He has tried GLP-1 RA before but could not tolerate. Will split Levemir for better coverage.  -Increase Levemir to 13 units BID - Continue oral regimen -Extensively discussed pathophysiology of DM, recommended lifestyle interventions, dietary effects on glycemic control -Counseled on s/sx of and management of hypoglycemia -Next A1C anticipated 06/2018.   ASCVD risk - secondary prevention in patient with DM. Last LDL is controlled. High intensity statin and ASA indicated at this time. -Continued aspirin 81 mg  -Continued atorvastatin 80 mg.   Written patient instructions provided.  Total time in face to face counseling 30 minutes.   Follow up w/ PCP   Butch Penny, PharmD, CPP Clinical Pharmacist [facility] [phone]

## 2018-06-13 ENCOUNTER — Encounter: Payer: Self-pay | Admitting: Pharmacist

## 2018-07-09 ENCOUNTER — Ambulatory Visit: Payer: Medicare Other | Attending: Family Medicine | Admitting: Pharmacist

## 2018-07-09 ENCOUNTER — Other Ambulatory Visit: Payer: Self-pay

## 2018-07-09 ENCOUNTER — Encounter: Payer: Self-pay | Admitting: Pharmacist

## 2018-07-09 DIAGNOSIS — Z833 Family history of diabetes mellitus: Secondary | ICD-10-CM | POA: Diagnosis not present

## 2018-07-09 DIAGNOSIS — E118 Type 2 diabetes mellitus with unspecified complications: Secondary | ICD-10-CM | POA: Diagnosis not present

## 2018-07-09 DIAGNOSIS — IMO0002 Reserved for concepts with insufficient information to code with codable children: Secondary | ICD-10-CM

## 2018-07-09 DIAGNOSIS — Z7984 Long term (current) use of oral hypoglycemic drugs: Secondary | ICD-10-CM | POA: Diagnosis not present

## 2018-07-09 DIAGNOSIS — E1165 Type 2 diabetes mellitus with hyperglycemia: Secondary | ICD-10-CM | POA: Insufficient documentation

## 2018-07-09 DIAGNOSIS — Z79899 Other long term (current) drug therapy: Secondary | ICD-10-CM | POA: Insufficient documentation

## 2018-07-09 DIAGNOSIS — Z794 Long term (current) use of insulin: Secondary | ICD-10-CM | POA: Diagnosis not present

## 2018-07-09 DIAGNOSIS — I2510 Atherosclerotic heart disease of native coronary artery without angina pectoris: Secondary | ICD-10-CM

## 2018-07-09 LAB — POCT GLYCOSYLATED HEMOGLOBIN (HGB A1C): Hemoglobin A1C: 11 % — AB (ref 4.0–5.6)

## 2018-07-09 MED ORDER — INSULIN DETEMIR 100 UNIT/ML FLEXPEN: 20.0000 [IU] | PEN_INJECTOR | Freq: Two times a day (BID) | SUBCUTANEOUS | 1 refills | Status: DC

## 2018-07-09 NOTE — Progress Notes (Signed)
    S:    PCP: [name]  No chief complaint on file.  Patient arrives in good spirits.  Presents for diabetes management at the request of [name]. Patient was referred on 04/02/2018.  A1c today up - 11 (from 9.7).   Patient reports diabetes was diagnosed 20 years.   Family/Social History:  - FHx: DM (mother, father) - Tobacco: never smoker - Alcohol: reports occasional use   Insurance coverage/medication affordability:  - [employer] Medicare  Patient reports adherence with medications.  Current diabetes medications include:  - Empagliflozin 25 mg daily - Levemir 13 units BID (reports taking 15 units BID) - Metformin 1000 mg BID  Patient denies hypoglycemic events.  Patient reported dietary habits:  - He does not limit carbs  - He reports drinking fruit juice  Patient-reported exercise habits:  - Plays soccer on the weekends - Main source of physical activity is through work    Patient reports polyuria. Denies polydipsia.  Patient denies neuropathy. Patient denies visual changes. Patient reports self foot exams.   O:  Home range: 100s - 220s  Lab Results  Component Value Date   HGBA1C 11.0 (A) 07/09/2018   There were no vitals filed for this visit.  Lipid Panel     Component Value Date/Time   CHOL 96 (L) 12/26/2017 0922   TRIG 81 12/26/2017 0922   HDL 35 (L) 12/26/2017 0922   CHOLHDL 2.7 12/26/2017 0922   CHOLHDL 4.8 09/12/2016 0806   VLDL 29 09/12/2016 0806   LDLCALC 45 12/26/2017 0922   Clinical ASCVD: Yes - CAD, NSTEMI/CABG The ASCVD Risk score Denman George DC Jr., et al., 2013) failed to calculate for the following reasons:   The patient has a prior MI or stroke diagnosis   A/P: Diabetes longstanding currently uncontrolled. A1c up from 9.7 to 11.  Patient is able to verbalize appropriate hypoglycemia management plan. Patient is adherent with medication. Control is suboptimal due to medication non-compliance, dietary indiscretion, and physical inactivity. Would like  to change to Lantus or Tresiba for better coverage, however, pt's insurance prefers levemir.    -Increase Levemir to 20 units BID -Continue oral regimen -Extensively discussed pathophysiology of DM, recommended lifestyle interventions, dietary effects on glycemic control -Counseled on s/sx of and management of hypoglycemia -Prevnar indicated: recommend at PCP f/u -Next A1C anticipated 09/2018  ASCVD risk - secondary prevention in patient with DM. Last LDL is controlled. High intensity statin and ASA indicated at this time. -Continued aspirin 81 mg  -Continued atorvastatin 80 mg.   Written patient instructions provided.  Total time in face to face counseling 30 minutes.   Follow up w/ PCP.  Butch Penny, PharmD, CPP Clinical Pharmacist [facility] [phone]

## 2018-07-09 NOTE — Patient Instructions (Addendum)
Arabic: shukraan lihudurak liruyati alyawm. yrja alqiam bima yly:   1. ziadat Levemir 'iilaa 20 wahdatan fi alsabah w 20 wahdatan fi allayl.  2. aistamar fi fahs sakuriaat aldami fi almanzil.  3. aistamara fi 'iijra' taghyirat fi namat alhayat alty naqashnaha meana 'athna' ziaratina. yaleab alnizam alghadhayiya wamumarasat alriyadat dwrana mhmana fi tahsin nisbat alsukar fi aldm.  4. almutabaeat mae tabibik al'asasi.  English:  Thank you for coming to see me today. Please do the following:  1. Increase Levemir to 20 units in the morning and 20 units at night.  2. Continue checking blood sugars at home.  3. Continue making the lifestyle changes we've discussed together during our visit. Diet and exercise play a significant role in improving your blood sugars.  4. Follow-up with your primary doctor.

## 2018-07-13 DIAGNOSIS — R55 Syncope and collapse: Secondary | ICD-10-CM | POA: Diagnosis not present

## 2018-07-13 DIAGNOSIS — R42 Dizziness and giddiness: Secondary | ICD-10-CM | POA: Diagnosis not present

## 2018-07-13 DIAGNOSIS — R079 Chest pain, unspecified: Secondary | ICD-10-CM | POA: Diagnosis not present

## 2018-07-13 DIAGNOSIS — R110 Nausea: Secondary | ICD-10-CM | POA: Diagnosis not present

## 2018-07-13 DIAGNOSIS — R072 Precordial pain: Secondary | ICD-10-CM | POA: Diagnosis not present

## 2018-07-13 DIAGNOSIS — R0902 Hypoxemia: Secondary | ICD-10-CM | POA: Diagnosis not present

## 2018-07-13 DIAGNOSIS — R1084 Generalized abdominal pain: Secondary | ICD-10-CM | POA: Diagnosis not present

## 2018-07-13 DIAGNOSIS — R4182 Altered mental status, unspecified: Secondary | ICD-10-CM | POA: Diagnosis not present

## 2018-07-13 DIAGNOSIS — R0789 Other chest pain: Secondary | ICD-10-CM | POA: Diagnosis not present

## 2018-07-16 ENCOUNTER — Emergency Department (HOSPITAL_COMMUNITY): Payer: Medicare Other

## 2018-07-16 ENCOUNTER — Encounter (HOSPITAL_COMMUNITY): Payer: Self-pay | Admitting: Emergency Medicine

## 2018-07-16 ENCOUNTER — Other Ambulatory Visit: Payer: Self-pay

## 2018-07-16 ENCOUNTER — Emergency Department (HOSPITAL_COMMUNITY)
Admission: EM | Admit: 2018-07-16 | Discharge: 2018-07-16 | Disposition: A | Discharge status: Home / Self Care | Payer: Medicare Other | Attending: Emergency Medicine | Admitting: Emergency Medicine

## 2018-07-16 DIAGNOSIS — Z951 Presence of aortocoronary bypass graft: Secondary | ICD-10-CM | POA: Diagnosis not present

## 2018-07-16 DIAGNOSIS — I10 Essential (primary) hypertension: Secondary | ICD-10-CM | POA: Diagnosis not present

## 2018-07-16 DIAGNOSIS — Z20828 Contact with and (suspected) exposure to other viral communicable diseases: Secondary | ICD-10-CM | POA: Diagnosis not present

## 2018-07-16 DIAGNOSIS — I2510 Atherosclerotic heart disease of native coronary artery without angina pectoris: Secondary | ICD-10-CM | POA: Diagnosis not present

## 2018-07-16 DIAGNOSIS — W19XXXA Unspecified fall, initial encounter: Secondary | ICD-10-CM | POA: Insufficient documentation

## 2018-07-16 DIAGNOSIS — R2681 Unsteadiness on feet: Secondary | ICD-10-CM | POA: Diagnosis present

## 2018-07-16 DIAGNOSIS — M545 Low back pain, unspecified: Secondary | ICD-10-CM

## 2018-07-16 DIAGNOSIS — R42 Dizziness and giddiness: Secondary | ICD-10-CM | POA: Diagnosis not present

## 2018-07-16 DIAGNOSIS — E119 Type 2 diabetes mellitus without complications: Secondary | ICD-10-CM | POA: Insufficient documentation

## 2018-07-16 DIAGNOSIS — R55 Syncope and collapse: Secondary | ICD-10-CM | POA: Diagnosis not present

## 2018-07-16 LAB — CBC WITH DIFFERENTIAL/PLATELET
Abs Immature Granulocytes: 0.03 10*3/uL (ref 0.00–0.07)
Basophils Absolute: 0 10*3/uL (ref 0.0–0.1)
Basophils Relative: 1 %
Eosinophils Absolute: 0.4 10*3/uL (ref 0.0–0.5)
Eosinophils Relative: 7 %
HCT: 36.6 % — ABNORMAL LOW (ref 39.0–52.0)
Hemoglobin: 11.7 g/dL — ABNORMAL LOW (ref 13.0–17.0)
Immature Granulocytes: 1 %
Lymphocytes Relative: 21 %
Lymphs Abs: 1.1 10*3/uL (ref 0.7–4.0)
MCH: 31.2 pg (ref 26.0–34.0)
MCHC: 32 g/dL (ref 30.0–36.0)
MCV: 97.6 fL (ref 80.0–100.0)
Monocytes Absolute: 0.8 10*3/uL (ref 0.1–1.0)
Monocytes Relative: 14 %
Neutro Abs: 3 10*3/uL (ref 1.7–7.7)
Neutrophils Relative %: 56 %
Platelets: 398 10*3/uL (ref 150–400)
RBC: 3.75 MIL/uL — ABNORMAL LOW (ref 4.22–5.81)
RDW: 12.4 % (ref 11.5–15.5)
WBC: 5.3 10*3/uL (ref 4.0–10.5)
nRBC: 0 % (ref 0.0–0.2)

## 2018-07-16 LAB — COMPREHENSIVE METABOLIC PANEL
ALT: 16 U/L (ref 0–44)
AST: 16 U/L (ref 15–41)
Albumin: 2.8 g/dL — ABNORMAL LOW (ref 3.5–5.0)
Alkaline Phosphatase: 70 U/L (ref 38–126)
Anion gap: 8 (ref 5–15)
BUN: 15 mg/dL (ref 8–23)
CO2: 25 mmol/L (ref 22–32)
Calcium: 8.9 mg/dL (ref 8.9–10.3)
Chloride: 104 mmol/L (ref 98–111)
Creatinine, Ser: 1.06 mg/dL (ref 0.61–1.24)
GFR calc Af Amer: >60 mL/min (ref >60)
GFR calc non Af Amer: >60 mL/min (ref >60)
Glucose, Bld: 169 mg/dL — ABNORMAL HIGH (ref 70–99)
Potassium: 4.4 mmol/L (ref 3.5–5.1)
Sodium: 137 mmol/L (ref 135–145)
Total Bilirubin: 0.5 mg/dL (ref 0.3–1.2)
Total Protein: 7.4 g/dL (ref 6.5–8.1)

## 2018-07-16 LAB — TROPONIN I: Troponin I: <0.03 ng/mL (ref <0.03)

## 2018-07-16 MED ORDER — DICLOFENAC SODIUM 1 % TD GEL: 2.0000 g | Freq: Four times a day (QID) | TRANSDERMAL | 0 refills | Status: DC | PRN

## 2018-07-16 NOTE — ED Triage Notes (Signed)
Patient arrived from home with reports of unstable gait. He reports passing out  while he was at work on Friday and woke up in the emergency department.  He reports that this morning he "felt heavy" on his way to the bathroom and fell, which is why he came to the ED. He report feeling dizzy when he's walking.  Patient expressed concerns about his work conditions (standing while working), and is requesting a note for work restriction. He is adamant that he needs a work note for his job and this appears to be "main concern."  EDP at bedside Electronic interpreter used during this interview

## 2018-07-16 NOTE — ED Notes (Signed)
Pt ambulates to bathroom with steady gait

## 2018-07-16 NOTE — Discharge Instructions (Signed)

## 2018-07-16 NOTE — ED Provider Notes (Signed)
Emergency Department Provider Note   I have reviewed the triage vital signs and the nursing notes.   HISTORY  Chief Complaint unstable gait  Encounter performed with audio Arabic interpreter  HPI Carl Clark is a 68 y.o. male with PMH of CAD, CABG, HTN, and DM resents to the emergency department with near syncope and falls.  Patient reports 2 falls in the last several days and is requesting a note for work saying that he can remain seated while on the job.  Patient initially had an episode of near syncope and fall while at work 4 days ago.  He was taken to the hospital and ultimately discharged.  He states that they were unable to provide him a note for work and told him that he would need to follow with his PCP and/or cardiologist for the note.  Patient states that this morning he felt lightheaded with standing to go to the bathroom and had an additional fall.  He denies loss of consciousness.  No pain in the arms or legs.  Patient states that he has had right-sided chest pain intermittently since his CABG in 2018.  He has been experiencing this recently but this is not changed from his baseline discomfort.  Patient also with some pain in the lower back which is chronic for him.  No weakness or numbness.  No headaches.  No vision changes.   Past Medical History:  Diagnosis Date  . CAD (coronary artery disease) 03/16/2017   S/p NSTEMI 6/18 >> LHC: multivessel CAD >> s/p CABG // Echo 6/18: Mild LVH, EF 60-65, normal wall motion, grade 1 diastolic dysfunction, normal RV SF, PASP 36 (borderline pulmonary hypertension) // Pre-CABG Dopplers 09/13/16: Bilateral ICA 1-39  . Family history of anesthesia complication    "never had anesthesia" (07/14/2012)  . History of non-ST elevation myocardial infarction (NSTEMI) 09/12/2016   Treated with CABG  . History of rectal bleeding    secondary to diverticulum affecting right hepatic flexure - 06/2012  . Hypertension   . Mild carotid artery  disease (HCC)    a. 1-39% bilaterally preCABG in 2018.  . Type II diabetes mellitus [facility])     Patient Active Problem List   Diagnosis Date Noted  . Uncontrolled type 2 diabetes mellitus with hyperglycemia (HCC) 06/15/2017  . Bilateral knee pain 06/12/2017  . CAD (coronary artery disease) 03/16/2017  . Hyperlipidemia 03/16/2017  . 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 History of non-ST elevation myocardial infarction (NSTEMI) 09/12/2016  . Rectal bleeding 07/13/2012  . Diabetes mellitus (HCC) 07/13/2012  . Essential hypertension 07/13/2012    Past Surgical History:  Procedure Laterality Date  . COLONOSCOPY Left 07/14/2012   Procedure: COLONOSCOPY;  Surgeon: Willis Modena, MD;  Location: [facility] ENDOSCOPY;  Service: Endoscopy;  Laterality: Left;  . CORONARY ARTERY BYPASS GRAFT N/A 09/15/2016   Procedure: CORONARY ARTERY BYPASS GRAFTING (CABG) x 3, LIMA to LAD, SVG to DIAGONAL, SVG to OM2, USING LEFT MAMMARY ARTERY AND RIGHT GREATER SAPHENOUS VEIN HARVESTED ENDOSCOPICALLY;  Surgeon: Delight Ovens, MD;  Location: [facility] OR;  Service: Open Heart Surgery;  Laterality: N/A;  . ESOPHAGOGASTRODUODENOSCOPY N/A 07/13/2012   Procedure: ESOPHAGOGASTRODUODENOSCOPY (EGD);  Surgeon: Willis Modena, MD;  Location: [facility] ENDOSCOPY;  Service: Endoscopy;  Laterality: N/A;  pt in ED A8  . LEFT HEART CATH AND CORONARY ANGIOGRAPHY N/A 09/12/2016   Procedure: Left Heart Cath and Coronary Angiography;  Surgeon: Tonny Bollman, MD;  Location: [facility]VASIVE CV LAB;  Service: Cardiovascular;  Laterality:  N/A;  . NO PAST SURGERIES    . TEE WITHOUT CARDIOVERSION N/A 09/15/2016   Procedure: TRANSESOPHAGEAL ECHOCARDIOGRAM (TEE);  Surgeon: Delight OvensGerhardt, Edward B, MD;  [facility];  Service: Open Heart Surgery;  Laterality: N/A;    Allergies Victoza [liraglutide]  Family History  Problem Relation Age of Onset  . Diabetes Mother   . Diabetes Father     Social History Social History   Tobacco Use  . Smoking status: Never Smoker   . Smokeless tobacco: Never Used  Substance Use Topics  . Alcohol use: No  . Drug use: No    Review of Systems  Constitutional: No fever/chills. Falling recently.  Eyes: No visual changes. ENT: No sore throat. Cardiovascular: Intermittent chest pain. Respiratory: Denies shortness of breath. Gastrointestinal: No abdominal pain.  No nausea, no vomiting.  No diarrhea.  No constipation. Genitourinary: Negative for dysuria. Musculoskeletal: Positive back pain.  Skin: Negative for rash. Neurological: Negative for headaches, focal weakness or numbness.  10-point ROS otherwise negative.  ____________________________________________   PHYSICAL EXAM:  VITAL SIGNS: ED Triage Vitals  Enc Vitals Group     BP 07/16/18 0930 (!) 161/75     Pulse Rate 07/16/18 0930 64     Resp 07/16/18 0930 11     Temp 07/16/18 0939 98.3 F (36.8 C)     Temp Source 07/16/18 0939 Oral     SpO2 07/16/18 0930 100 %     Weight 07/16/18 0951 148 lb (67.1 kg)     Height 07/16/18 0951 5\' 5"  (1.651 m)   Constitutional: Alert and oriented. Well appearing and in no acute distress. Eyes: Conjunctivae are normal.  Head: Atraumatic. Nose: No congestion/rhinnorhea. Mouth/Throat: Mucous membranes are moist.   Neck: No stridor.  Cardiovascular: Normal rate, regular rhythm. Good peripheral circulation. Grossly normal heart sounds.   Respiratory: Normal respiratory effort.  No retractions. Lungs CTAB. Gastrointestinal: Soft and nontender. No distention.  Musculoskeletal: No lower extremity tenderness nor edema. No gross deformities of extremities. Neurologic:  Normal speech and language. No gross focal neurologic deficits are appreciated. No facial asymmetry. Normal strength in the upper and lower extremities.   Skin:  Skin is warm, dry and intact. No rash noted.  ____________________________________________   LABS (all labs ordered are listed, but only abnormal results are displayed)  Labs Reviewed   COMPREHENSIVE METABOLIC PANEL - Abnormal; Notable for the following components:      Result Value   Glucose, Bld 169 (*)    Albumin 2.8 (*)    All other components within normal limits  CBC WITH DIFFERENTIAL/PLATELET - Abnormal; Notable for the following components:   RBC 3.75 (*)    Hemoglobin 11.7 (*)    HCT 36.6 (*)    All other components within normal limits  TROPONIN I   ____________________________________________  EKG   EKG Interpretation  Date/Time:  Monday July 16 2018 09:24:55 EDT Ventricular Rate:  56 PR Interval:    QRS Duration: 84 QT Interval:  375 QTC Calculation: 362 R Axis:   71 Text Interpretation:  Sinus rhythm Borderline short PR interval Probable left atrial enlargement No STEMI.  Confirmed by Alona BeneLong,  [phone]([id number]) on 07/16/2018 9:58:33 AM       ____________________________________________  RADIOLOGY  Ct Head Wo Contrast  Result Date: 07/16/2018 CLINICAL DATA:  Syncopal episode at work 2 days ago with persistent unsteady gait since that time. EXAM: CT HEAD WITHOUT CONTRAST TECHNIQUE: Contiguous axial images were obtained from the base of the skull through the vertex  without intravenous contrast. COMPARISON:  06/20/2014 FINDINGS: Brain: Ventricles, cisterns and other CSF spaces are normal. No mass, mass effect, shift of midline structures or acute hemorrhage. No evidence of acute infarction. Vascular: No hyperdense vessel or unexpected calcification. Skull: Normal. Negative for fracture or focal lesion. Sinuses/Orbits: Orbits are normal. Paranasal sinuses well developed and well aerated with subtle mucosal membrane thickening over the posterior right maxillary wall. Other: None. IMPRESSION: No acute findings. Electronically Signed   By: Elberta Fortis M.D.   On: 07/16/2018 10:47   Dg Chest Portable 1 View  Result Date: 07/16/2018 CLINICAL DATA:  68 year old male with history of syncope.  Fall. EXAM: PORTABLE CHEST 1 VIEW COMPARISON:  Chest x-ray 05/15/2018.  FINDINGS: Lung volumes are normal. No consolidative airspace disease. No pleural effusions. No pneumothorax. No pulmonary nodule or mass noted. Pulmonary vasculature and the cardiomediastinal silhouette are within normal limits. Status post median sternotomy for CABG. IMPRESSION: 1.  No radiographic evidence of acute cardiopulmonary disease. Electronically Signed   By: Trudie Reed M.D.   On: 07/16/2018 10:08    ____________________________________________   PROCEDURES  Procedure(s) performed:   Procedures  None ____________________________________________   INITIAL IMPRESSION / ASSESSMENT AND PLAN / ED COURSE  Pertinent labs & imaging results that were available during my care of the patient were reviewed by me and considered in my medical decision making (see chart for details).   Presents to the emergency department with falls recently.  No focal abnormalities on exam.  Normal neurologic exam.  Patient has had intermittent right-sided chest pain since his CABG in 2018 with no worsening symptoms in the past Avril days.  Afebrile.  Very low suspicion for COVID.  Plan for CT imaging of the head, chest x-ray, lab work including troponin.  EKG shows no acute ischemic changes or other arrhythmia. Orthostatic vitals pending.   Labs and imaging reviewed. No acute findings. Provided a work note for ability to sit at work for two weeks. Advise that further notes will need to come from PCP. Patient ambulatory in the ED without issue. Orthostatics normal. Doubt ACS, PE, or cardiogenic syncope. Discussed results and plan using Arabic interpreter. Voltaren gel given for right lower back pain. Exceedingly low suspicion for vascular cause for back pain given right sided location. No signs to suspect spine emergency.  ____________________________________________  FINAL CLINICAL IMPRESSION(S) / ED DIAGNOSES  Final diagnoses:  Fall, initial encounter  Dizzy spells  Acute right-sided low back pain  without sciatica     NEW OUTPATIENT MEDICATIONS STARTED DURING THIS VISIT:  Discharge Medication List as of 07/16/2018 11:24 AM    START taking these medications   Details  diclofenac sodium (VOLTAREN) 1 % GEL Apply 2 g topically 4 (four) times daily as needed., Starting Mon 07/16/2018, Print        Note:  This document was prepared using Dragon voice recognition software and may include unintentional dictation errors.  Alona Bene, MD Emergency Medicine    , Arlyss Repress, MD 07/16/18 Elisha Ponder

## 2018-07-16 NOTE — ED Notes (Signed)
Pt states he understands instructions and home stable with steady gait.

## 2018-07-23 ENCOUNTER — Encounter: Payer: Self-pay | Admitting: Nurse Practitioner

## 2018-07-23 ENCOUNTER — Other Ambulatory Visit: Payer: Self-pay

## 2018-07-23 ENCOUNTER — Ambulatory Visit: Payer: Medicare Other | Admitting: Nurse Practitioner

## 2018-07-23 NOTE — Progress Notes (Signed)
This encounter was created in error - please disregard.

## 2018-07-24 ENCOUNTER — Encounter: Payer: Self-pay | Admitting: Nurse Practitioner

## 2018-07-24 ENCOUNTER — Ambulatory Visit: Payer: Medicare Other | Attending: Nurse Practitioner | Admitting: Nurse Practitioner

## 2018-07-24 VITALS — BP 109/67 | HR 59 | Temp 98.0°F | Ht 65.0 in | Wt 136.2 lb

## 2018-07-24 DIAGNOSIS — Z951 Presence of aortocoronary bypass graft: Secondary | ICD-10-CM | POA: Insufficient documentation

## 2018-07-24 DIAGNOSIS — I252 Old myocardial infarction: Secondary | ICD-10-CM | POA: Insufficient documentation

## 2018-07-24 DIAGNOSIS — I2510 Atherosclerotic heart disease of native coronary artery without angina pectoris: Secondary | ICD-10-CM | POA: Insufficient documentation

## 2018-07-24 DIAGNOSIS — I10 Essential (primary) hypertension: Secondary | ICD-10-CM | POA: Diagnosis not present

## 2018-07-24 DIAGNOSIS — Z833 Family history of diabetes mellitus: Secondary | ICD-10-CM | POA: Diagnosis not present

## 2018-07-24 DIAGNOSIS — Z791 Long term (current) use of non-steroidal anti-inflammatories (NSAID): Secondary | ICD-10-CM | POA: Diagnosis not present

## 2018-07-24 DIAGNOSIS — Z888 Allergy status to other drugs, medicaments and biological substances status: Secondary | ICD-10-CM | POA: Insufficient documentation

## 2018-07-24 DIAGNOSIS — D649 Anemia, unspecified: Secondary | ICD-10-CM | POA: Insufficient documentation

## 2018-07-24 DIAGNOSIS — Z1211 Encounter for screening for malignant neoplasm of colon: Secondary | ICD-10-CM

## 2018-07-24 DIAGNOSIS — Z794 Long term (current) use of insulin: Secondary | ICD-10-CM | POA: Diagnosis not present

## 2018-07-24 DIAGNOSIS — Z79899 Other long term (current) drug therapy: Secondary | ICD-10-CM | POA: Diagnosis not present

## 2018-07-24 DIAGNOSIS — E1165 Type 2 diabetes mellitus with hyperglycemia: Secondary | ICD-10-CM | POA: Diagnosis not present

## 2018-07-24 DIAGNOSIS — Z7982 Long term (current) use of aspirin: Secondary | ICD-10-CM | POA: Diagnosis not present

## 2018-07-24 DIAGNOSIS — Z7951 Long term (current) use of inhaled steroids: Secondary | ICD-10-CM | POA: Diagnosis not present

## 2018-07-24 DIAGNOSIS — L219 Seborrheic dermatitis, unspecified: Secondary | ICD-10-CM | POA: Insufficient documentation

## 2018-07-24 DIAGNOSIS — Z760 Encounter for issue of repeat prescription: Secondary | ICD-10-CM | POA: Insufficient documentation

## 2018-07-24 DIAGNOSIS — IMO0002 Reserved for concepts with insufficient information to code with codable children: Secondary | ICD-10-CM

## 2018-07-24 DIAGNOSIS — E118 Type 2 diabetes mellitus with unspecified complications: Secondary | ICD-10-CM

## 2018-07-24 LAB — GLUCOSE, POCT (MANUAL RESULT ENTRY): POC Glucose: 174 mg/dl — AB (ref 70–99)

## 2018-07-24 MED ORDER — INSULIN DETEMIR 100 UNIT/ML FLEXPEN: 30.0000 [IU] | PEN_INJECTOR | Freq: Two times a day (BID) | SUBCUTANEOUS | 1 refills | Status: DC

## 2018-07-24 MED ORDER — FLUOCINONIDE 0.05 % EX CREA: 1.0000 "application " | TOPICAL_CREAM | Freq: Two times a day (BID) | CUTANEOUS | 0 refills | Status: DC

## 2018-07-24 MED ORDER — BETAMETHASONE DIPROPIONATE 0.05 % EX CREA: TOPICAL_CREAM | Freq: Two times a day (BID) | CUTANEOUS | 0 refills | Status: DC

## 2018-07-24 MED ORDER — KETOCONAZOLE 2 % EX SHAM: 1.0000 "application " | MEDICATED_SHAMPOO | CUTANEOUS | 2 refills | Status: DC

## 2018-07-24 MED FILL — BETAMETHASONE DP 0.05% CRM: 0.05 | 30 days supply | Qty: 30 | Fill #0

## 2018-07-24 MED FILL — KETOCONAZOLE 2% SHAMPOO: 2 | 14 days supply | Qty: 120 | Fill #0

## 2018-07-24 NOTE — Progress Notes (Signed)
.zwf   Assessment & Plan:  [name] was seen today for medication refill.  Diagnoses and all orders for this visit:  Seborrheic dermatitis of scalp -     ketoconazole (NIZORAL) 2 % shampoo; Apply 1 application topically 2 (two) times a week. -     fluocinonide cream (LIDEX) 0.05 %; Apply 1 application topically 2 (two) times daily. -     betamethasone dipropionate (DIPROLENE) 0.05 % cream; Apply topically 2 (two) times daily.  Colon cancer screening -     Ambulatory referral to Gastroenterology  Anemia, unspecified type -     Ambulatory referral to Gastroenterology  Diabetes mellitus type 2, uncontrolled, with complications (HCC) -     Glucose (CBG) -     Insulin Detemir (LEVEMIR FLEXTOUCH) 100 UNIT/ML Pen; Inject 30 Units into the skin 2 (two) times daily. Diabetes is poorly controlled. Advised patient to keep a fasting blood sugar log fast, 2 hours post lunch and bedtime which will be reviewed at the next office visit.   Patient has been counseled on age-appropriate routine health concerns for screening and prevention. These are reviewed and up-to-date. Referrals have been placed accordingly. Immunizations are up-to-date or declined.    Subjective:   Chief Complaint  Patient presents with  . Medication Refill    Pt. wants to refill on Prednisone for itching on the back of his neck.    HPI Carl Clark 68 y.o. male presents to office today with complaints of intense itching in the back of his head. PMH includes: CAD, NSTEMI, HTN, CAD.   Seborrhea Patient complains of seborrheic dermatitis. Patient complains of itching, scaling, rash at the scalp.  Symptoms have been ongoing for about several months. Previous treatment has included po prednisone with excellent improvement.   DM TYPE 2 Chronic and poorly controlled. He is not checking his blood glucose levels every day. Can not recall any meter readings. He is not diet or exercise compliant but endorses medication  compliance taking Levemir 20 units BID, Jardiance 64m and Metformin 1000 mg BID. Denies any hypo or hyperglycemic symptoms. Will increase Levemir to 30 units BID today. Patient verbalized understanding.  Lab Results  Component Value Date   HGBA1C 11.0 (A) 07/09/2018   Review of Systems  Constitutional: Negative for fever, malaise/fatigue and weight loss.  HENT: Negative.  Negative for nosebleeds.   Eyes: Negative.  Negative for blurred vision, double vision and photophobia.  Respiratory: Negative.  Negative for cough and shortness of breath.   Cardiovascular: Negative.  Negative for chest pain, palpitations and leg swelling.  Gastrointestinal: Negative.  Negative for heartburn, nausea and vomiting.  Musculoskeletal: Negative.  Negative for myalgias.  Skin: Positive for itching and rash.  Neurological: Negative.  Negative for dizziness, focal weakness, seizures and headaches.  Psychiatric/Behavioral: Negative.  Negative for suicidal ideas.    Past Medical History:  Diagnosis Date  . CAD (coronary artery disease) 03/16/2017   S/p NSTEMI 6/18 >> LHC: multivessel CAD >> s/p CABG // Echo 6/18: Mild LVH, EF 60-65, normal wall motion, grade 1 diastolic dysfunction, normal RV SF, PASP 36 (borderline pulmonary hypertension) // Pre-CABG Dopplers 09/13/16: Bilateral ICA 1-39  . Family history of anesthesia complication    "never had anesthesia" (07/14/2012)  . History of non-ST elevation myocardial infarction (NSTEMI) 09/12/2016   Treated with CABG  . History of rectal bleeding    secondary to diverticulum affecting right hepatic flexure - 06/2012  . Hypertension   . Mild carotid artery disease ([place]  a. 1-39% bilaterally preCABG in 2018.  . Type II diabetes mellitus ([place])     Past Surgical History:  Procedure Laterality Date  . COLONOSCOPY Left 07/14/2012   Procedure: COLONOSCOPY;  Surgeon: Arta Silence, MD;  [facility] ENDOSCOPY;  Service: Endoscopy;  Laterality: Left;  . CORONARY  ARTERY BYPASS GRAFT N/A 09/15/2016   Procedure: CORONARY ARTERY BYPASS GRAFTING (CABG) x 3, LIMA to LAD, SVG to DIAGONAL, SVG to OM2, USING LEFT MAMMARY ARTERY AND RIGHT GREATER SAPHENOUS VEIN HARVESTED ENDOSCOPICALLY;  Surgeon: Grace Isaac, MD;  Location: [place];  Service: Open Heart Surgery;  Laterality: N/A;  . ESOPHAGOGASTRODUODENOSCOPY N/A 07/13/2012   Procedure: ESOPHAGOGASTRODUODENOSCOPY (EGD);  Surgeon: Arta Silence, MD;  Location: [facility] ENDOSCOPY;  Service: Endoscopy;  Laterality: N/A;  pt in ED A8  . LEFT HEART CATH AND CORONARY ANGIOGRAPHY N/A 09/12/2016   Procedure: Left Heart Cath and Coronary Angiography;  Surgeon: Sherren Mocha, MD;  Location: [place] CV LAB;  Service: Cardiovascular;  Laterality: N/A;  . NO PAST SURGERIES    . TEE WITHOUT CARDIOVERSION N/A 09/15/2016   Procedure: TRANSESOPHAGEAL ECHOCARDIOGRAM (TEE);  Surgeon: Grace Isaac, MD;  Location: [place];  Service: Open Heart Surgery;  Laterality: N/A;    Family History  Problem Relation Age of Onset  . Diabetes Mother   . Diabetes Father     Social History Reviewed with no changes to be made today.   Outpatient Medications Prior to Visit  Medication Sig Dispense Refill  . ACCU-CHEK FASTCLIX LANCETS MISC Use as instructed to check blood sugar 3 times weekly before breakfast. E11.9 102 each 2  . amLODipine (NORVASC) 5 MG tablet Take 1 tablet (5 mg total) by mouth daily. 90 tablet 0  . aspirin 81 MG tablet Take 1 tablet (81 mg total) by mouth daily. 30 tablet 12  . atorvastatin (LIPITOR) 80 MG tablet TAKE 1 TABLET BY MOUTH ONCE DAILY AT  6PM 90 tablet 0  . Blood Glucose Monitoring Suppl (ACCU-CHEK GUIDE ME) w/Device KIT 1 kit by Does not apply route 3 (three) times daily. Use to check blood sugar 3 times daily. E11.9 1 kit 0  . diclofenac sodium (VOLTAREN) 1 % GEL Apply 2 g topically 4 (four) times daily as needed. 100 g 0  . empagliflozin (JARDIANCE) 25 MG TABS tablet Take 25 mg by mouth daily. 90 tablet  0  . fluticasone (FLONASE) 50 MCG/ACT nasal spray Place 2 sprays into both nostrils daily. 16 g 6  . glucose blood (ACCU-CHEK GUIDE) test strip Use as instructed to check blood sugar 3 times weekly before breakfast. E11.9 100 each 2  . Insulin Pen Needle (TRUEPLUS PEN NEEDLES) 32G X 4 MM MISC Use as directed to inject. 100 each 11  . losartan (COZAAR) 100 MG tablet Take 1 tablet (100 mg total) by mouth daily. 90 tablet 0  . metFORMIN (GLUCOPHAGE) 1000 MG tablet Take 1 tablet (1,000 mg total) by mouth 2 (two) times daily with a meal. 180 tablet 0  . metoprolol tartrate (LOPRESSOR) 25 MG tablet Take 0.5 tablets (12.5 mg total) by mouth 2 (two) times daily. 90 tablet 0  . Misc. Devices MISC Please provide patient with an insurance approved QUAD CANE 1 each 0  . Insulin Detemir (LEVEMIR FLEXTOUCH) 100 UNIT/ML Pen Inject 20 Units into the skin 2 (two) times daily. 30 mL 1  . oseltamivir (TAMIFLU) 75 MG capsule Take 1 capsule (75 mg total) by mouth every 12 (twelve) hours. 10 capsule 0  .  predniSONE (DELTASONE) 20 MG tablet Take 40 mg by mouth daily. For 10 days     No facility-administered medications prior to visit.     Allergies  Allergen Reactions  . Victoza [Liraglutide] Swelling    Lip swelling, itching, rash       Objective:    BP 109/67 (BP Location: Left Arm, Patient Position: Sitting, Cuff Size: Normal)   Pulse (!) 59   Temp 98 F (36.7 C) (Oral)   Ht '5\' 5"'  (1.651 m)   Wt 136 lb 3.2 oz (61.8 kg)   SpO2 100%   BMI 22.66 kg/m  Wt Readings from Last 3 Encounters:  07/24/18 136 lb 3.2 oz (61.8 kg)  07/16/18 148 lb (67.1 kg)  05/15/18 145 lb (65.8 kg)    Physical Exam Vitals signs and nursing note reviewed.  Constitutional:      Appearance: He is well-developed.  HENT:     Head: Normocephalic and atraumatic.   Neck:     Musculoskeletal: Normal range of motion.  Cardiovascular:     Rate and Rhythm: Regular rhythm. Bradycardia present.     Heart sounds: Normal heart  sounds. No murmur. No friction rub. No gallop.   Pulmonary:     Effort: Pulmonary effort is normal. No tachypnea or respiratory distress.     Breath sounds: Normal breath sounds. No decreased breath sounds, wheezing, rhonchi or rales.  Chest:     Chest wall: No tenderness.  Abdominal:     General: Bowel sounds are normal.     Palpations: Abdomen is soft.  Musculoskeletal: Normal range of motion.  Skin:    General: Skin is warm and dry.  Neurological:     Mental Status: He is alert and oriented to person, place, and time.     Coordination: Coordination normal.  Psychiatric:        Behavior: Behavior normal. Behavior is cooperative.        Thought Content: Thought content normal.        Judgment: Judgment normal.          Patient has been counseled extensively about nutrition and exercise as well as the importance of adherence with medications and regular follow-up. The patient was given clear instructions to go to ER or return to medical center if symptoms don't improve, worsen or new problems develop. The patient verbalized understanding.   Follow-up: Return in about 11 weeks (around 10/09/2018) for HTN/HPL/DM.   Gildardo Pounds, FNP-BC [facility] [place], [place]   07/24/2018, 9:54 AM

## 2018-07-25 ENCOUNTER — Other Ambulatory Visit: Payer: Self-pay | Admitting: Family Medicine

## 2018-07-25 DIAGNOSIS — E1165 Type 2 diabetes mellitus with hyperglycemia: Secondary | ICD-10-CM

## 2018-08-27 DIAGNOSIS — H35033 Hypertensive retinopathy, bilateral: Secondary | ICD-10-CM | POA: Diagnosis not present

## 2018-08-27 DIAGNOSIS — H18413 Arcus senilis, bilateral: Secondary | ICD-10-CM | POA: Diagnosis not present

## 2018-08-27 DIAGNOSIS — H40013 Open angle with borderline findings, low risk, bilateral: Secondary | ICD-10-CM | POA: Diagnosis not present

## 2018-08-27 DIAGNOSIS — H11153 Pinguecula, bilateral: Secondary | ICD-10-CM | POA: Diagnosis not present

## 2018-08-27 DIAGNOSIS — H0102B Squamous blepharitis left eye, upper and lower eyelids: Secondary | ICD-10-CM | POA: Diagnosis not present

## 2018-08-27 DIAGNOSIS — H0102A Squamous blepharitis right eye, upper and lower eyelids: Secondary | ICD-10-CM | POA: Diagnosis not present

## 2018-08-27 LAB — HM DIABETES EYE EXAM

## 2018-09-03 DIAGNOSIS — H0102A Squamous blepharitis right eye, upper and lower eyelids: Secondary | ICD-10-CM | POA: Diagnosis not present

## 2018-09-03 DIAGNOSIS — H11153 Pinguecula, bilateral: Secondary | ICD-10-CM | POA: Diagnosis not present

## 2018-09-03 DIAGNOSIS — H0102B Squamous blepharitis left eye, upper and lower eyelids: Secondary | ICD-10-CM | POA: Diagnosis not present

## 2018-09-03 DIAGNOSIS — H18413 Arcus senilis, bilateral: Secondary | ICD-10-CM | POA: Diagnosis not present

## 2018-09-03 DIAGNOSIS — H40013 Open angle with borderline findings, low risk, bilateral: Secondary | ICD-10-CM | POA: Diagnosis not present

## 2018-09-03 DIAGNOSIS — H35033 Hypertensive retinopathy, bilateral: Secondary | ICD-10-CM | POA: Diagnosis not present

## 2018-09-03 DIAGNOSIS — H25813 Combined forms of age-related cataract, bilateral: Secondary | ICD-10-CM | POA: Diagnosis not present

## 2018-09-03 MED FILL — KETOROLAC 0.5% OPHTH SOLN: 0.5 | 30 days supply | Qty: 10 | Fill #0

## 2018-09-03 MED FILL — OFLOXACIN 0.3% EYE DROPS: 0.3 | 18 days supply | Qty: 5 | Fill #0

## 2018-09-03 MED FILL — PREDNISOLONE AC 1% EYE DROP: 1 | 30 days supply | Qty: 10 | Fill #0

## 2018-09-05 ENCOUNTER — Other Ambulatory Visit: Payer: Self-pay

## 2018-09-05 ENCOUNTER — Other Ambulatory Visit: Payer: Medicare Other | Admitting: *Deleted

## 2018-09-05 DIAGNOSIS — I10 Essential (primary) hypertension: Secondary | ICD-10-CM

## 2018-09-05 DIAGNOSIS — I2510 Atherosclerotic heart disease of native coronary artery without angina pectoris: Secondary | ICD-10-CM

## 2018-09-05 DIAGNOSIS — E782 Mixed hyperlipidemia: Secondary | ICD-10-CM | POA: Diagnosis not present

## 2018-09-05 LAB — LIPID PANEL
Chol/HDL Ratio: 4.2 ratio (ref 0.0–5.0)
Cholesterol, Total: 133 mg/dL (ref 100–199)
HDL: 32 mg/dL — ABNORMAL LOW (ref >39)
LDL Calculated: 47 mg/dL (ref 0–99)
Triglycerides: 269 mg/dL — ABNORMAL HIGH (ref 0–149)
VLDL Cholesterol Cal: 54 mg/dL — ABNORMAL HIGH (ref 5–40)

## 2018-09-05 LAB — COMPREHENSIVE METABOLIC PANEL
ALT: 20 IU/L (ref 0–44)
AST: 18 IU/L (ref 0–40)
Albumin/Globulin Ratio: 1.2 (ref 1.2–2.2)
Albumin: 4.2 g/dL (ref 3.8–4.8)
Alkaline Phosphatase: 83 IU/L (ref 39–117)
BUN/Creatinine Ratio: 21 (ref 10–24)
BUN: 20 mg/dL (ref 8–27)
Bilirubin Total: 0.3 mg/dL (ref 0.0–1.2)
CO2: 24 mmol/L (ref 20–29)
Calcium: 9.5 mg/dL (ref 8.6–10.2)
Chloride: 98 mmol/L (ref 96–106)
Creatinine, Ser: 0.97 mg/dL (ref 0.76–1.27)
GFR calc Af Amer: 93 mL/min/{1.73_m2} (ref >59)
GFR calc non Af Amer: 80 mL/min/{1.73_m2} (ref >59)
Globulin, Total: 3.5 g/dL (ref 1.5–4.5)
Glucose: 301 mg/dL — ABNORMAL HIGH (ref 65–99)
Potassium: 4.5 mmol/L (ref 3.5–5.2)
Sodium: 133 mmol/L — ABNORMAL LOW (ref 134–144)
Total Protein: 7.7 g/dL (ref 6.0–8.5)

## 2018-09-11 NOTE — Progress Notes (Signed)
Cardiology Office Note:    Date:  09/12/2018   ID:  Carl Clark, Carl Clark [birth year], MRN [id number]  PCP:  Carl Pounds, NP  Cardiologist:  Carl Mocha, MD   Electrophysiologist:  None   Referring MD: Carl Pounds, NP   Chief Complaint  Patient presents with  . Follow-up    CAD  . Loss of Consciousness    History of Present Illness:    Carl Clark is a 68 y.o. [place] male with:   Coronary artery disease  S/p CABG  NSTEMI 08/2016 >> s/p CABG (LIMA-LAD, SVG-diagonal, SVG-OM) - Dr Servando Snare  Diabetes Mellitus  Hypertension  Hyperlipidemia    Carl Clark was last seen in 02/2018.  He went to the ED in [facility] 07/13/2018 with syncope.  Workup was neg.  He presented to [facility] ED with a fall 07/16/2018.  He is seen today with the help of an interpreter.  He continues to have back pain and knee pain after his fall in April.  He tells me he did lose consciousness in April.  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  He had no prodrome or warning.  He woke on the floor.  He had no chest pain. Since    then, he quit his job.  It has not happened again.  He notes some R sided chest pain.  He does not seem to describe exertional symptoms and he has not had any symptoms reminiscent of his prior angina.  He seems to be describing MSK pain.  He has not had shortness of breath, recurrent syncope, paroxysmal nocturnal dyspnea, leg swelling.    Prior CV studies:   The following studies were reviewed today:  Intraoperative TEE 09/15/16 Moderate concentric LVH, EF 60-65, normal wall motion, trace TR   Echocardiogram 09/14/16 Mild LVH, EF 60-65, normal wall motion, grade 1 diastolic dysfunction, normal RV SF, PASP 36 (borderline pulmonary hypertension)   Cardiac catheterization 09/12/16 Three-vessel CAD >> referred for CABG    Pre-CABG Dopplers 09/13/16 Bilateral ICA 1-39  Past Medical  History:  Diagnosis Date  . CAD (coronary artery disease) 03/16/2017   S/p NSTEMI 6/18 >> LHC: multivessel CAD >> s/p CABG // Echo 6/18: Mild LVH, EF 60-65, normal wall motion, grade 1 diastolic dysfunction, normal RV SF, PASP 36 (borderline pulmonary hypertension) // Pre-CABG Dopplers 09/13/16: Bilateral ICA 1-39  . Family history of anesthesia complication    "never had anesthesia" (07/14/2012)  . History of non-ST elevation myocardial infarction (NSTEMI) 09/12/2016   Treated with CABG  . History of rectal bleeding    secondary to diverticulum affecting right hepatic flexure - 06/2012  . Hypertension   . Mild carotid artery disease (HCC)    a. 1-39% bilaterally preCABG in 2018.  . Type II diabetes mellitus ([place])    Surgical Hx: The patient  has a past surgical history that includes No past surgeries; Esophagogastroduodenoscopy (N/A, 07/13/2012); Colonoscopy (Left, 07/14/2012); LEFT HEART CATH AND CORONARY ANGIOGRAPHY (N/A, 09/12/2016); Coronary artery bypass graft (N/A, 09/15/2016); and TEE without cardioversion (N/A, 09/15/2016).   Current Medications: Current Meds  Medication Sig  . ACCU-CHEK FASTCLIX LANCETS MISC Use as instructed to check blood sugar 3 times weekly before breakfast. E11.9  . amLODipine (NORVASC) 5 MG tablet Take 1 tablet (5 mg total) by mouth daily.  Marland Kitchen aspirin 81 MG tablet Take 1 tablet (81 mg total) by mouth daily.  Marland Kitchen  atorvastatin (LIPITOR) 80 MG tablet TAKE 1 TABLET BY MOUTH ONCE DAILY AT  6PM  . betamethasone dipropionate (DIPROLENE) 0.05 % cream Apply topically 2 (two) times daily.  . Blood Glucose Monitoring Suppl (ACCU-CHEK GUIDE ME) w/Device KIT 1 kit by Does not apply route 3 (three) times daily. Use to check blood sugar 3 times daily. E11.9  . diclofenac sodium (VOLTAREN) 1 % GEL Apply 2 g topically 4 (four) times daily as needed.  . fluocinonide cream (LIDEX) 0.73 % Apply 1 application topically 2 (two) times daily.  . fluticasone (FLONASE) 50 MCG/ACT nasal spray  Place 2 sprays into both nostrils daily.  Marland Kitchen glucose blood (ACCU-CHEK GUIDE) test strip Use as instructed to check blood sugar 3 times weekly before breakfast. E11.9  . Insulin Detemir (LEVEMIR FLEXTOUCH) 100 UNIT/ML Pen Inject 30 Units into the skin 2 (two) times daily.  . Insulin Pen Needle (TRUEPLUS PEN NEEDLES) 32G X 4 MM MISC Use as directed to inject.  Marland Kitchen ketoconazole (NIZORAL) 2 % shampoo Apply 1 application topically 2 (two) times a week.  . metFORMIN (GLUCOPHAGE) 1000 MG tablet TAKE 1 TABLET BY MOUTH TWICE DAILY WITH MEALS  . Misc. Devices MISC Please provide patient with an insurance approved QUAD CANE  . [DISCONTINUED] amLODipine (NORVASC) 5 MG tablet Take 1 tablet by mouth once daily  . [DISCONTINUED] atorvastatin (LIPITOR) 80 MG tablet TAKE 1 TABLET BY MOUTH ONCE DAILY AT  6PM     Allergies:   Victoza [liraglutide]   Social History   Tobacco Use  . Smoking status: Never Smoker  . Smokeless tobacco: Never Used  Substance Use Topics  . Alcohol use: No  . Drug use: No     Family Hx: The patient's family history includes Diabetes in his father and mother.  ROS:   Please see the history of present illness.    ROS All other systems reviewed and are negative.   EKGs/Labs/Other Test Reviewed:    EKG:  EKG is not ordered today.  The ekg from 07/23/2018  demonstrates sinus brady, HR 56, normal axis, QTc 362  Recent Labs: 07/16/2018: Hemoglobin 11.7; Platelets 398 09/05/2018: ALT 20; BUN 20; Creatinine, Ser 0.97; Potassium 4.5; Sodium 133   Recent Lipid Panel Lab Results  Component Value Date/Time   CHOL 133 09/05/2018 08:15 AM   TRIG 269 (H) 09/05/2018 08:15 AM   HDL 32 (L) 09/05/2018 08:15 AM   CHOLHDL 4.2 09/05/2018 08:15 AM   CHOLHDL 4.8 09/12/2016 08:06 AM   LDLCALC 47 09/05/2018 08:15 AM    Physical Exam:    VS:  BP 132/68   Pulse 60   Ht '5\' 5"'  (1.651 m)   Wt 144 lb (65.3 kg)   SpO2 96%   BMI 23.96 kg/m     Wt Readings from Last 3 Encounters:  09/12/18  144 lb (65.3 kg)  07/24/18 136 lb 3.2 oz (61.8 kg)  07/16/18 148 lb (67.1 kg)     Physical Exam  Constitutional: He is oriented to person, place, and time. He appears well-developed and well-nourished. No distress.  HENT:  Head: Normocephalic and atraumatic.  Neck: No JVD present. No thyromegaly present.  Cardiovascular: Normal rate, regular rhythm and normal heart sounds.  No murmur heard. Pulmonary/Chest: Effort normal and breath sounds normal. He has no rales.  Abdominal: Soft. There is no hepatomegaly.  Musculoskeletal:        General: No edema.  Lymphadenopathy:    He has no cervical adenopathy.  Neurological: He is alert  and oriented to person, place, and time.  Skin: Skin is warm and dry.  Psychiatric: He has a normal mood and affect.    ASSESSMENT & PLAN:    1. Coronary artery disease involving native coronary artery of native heart without angina pectoris Hx of MI followed by CABG in 2018.  He has not really had any anginal symptoms.  I suspect his recent syncopal episode was vasovagal.  I do not think he needs a stress test at this time. Continue ASA, statin, beta-blocker, angiotensin receptor blocker.    2. Syncope, unspecified syncope type As noted, this was probably vasovagal.  I will have him do an Echo to rule out new LV dysfunction.  I will also get a Holter monitor x 24 hours since he does have bradycardia on his ECG.  As long as these are unremarkable, he will not need further testing unless syncope recurs.   3. Essential hypertension The patient's blood pressure is controlled on his current regimen.  Continue current therapy.    4. Hyperlipidemia, unspecified hyperlipidemia type LDL optimal on most recent lab work.  Continue current Rx.    5. Back pain I have asked him to follow up with his PCP for management of this.    Dispo:  Return in about 6 months (around 03/14/2019) for Routine Follow Up, w/ Dr. Burt Knack, or Richardson Dopp, PA-C.   Medication  Adjustments/Labs and Tests Ordered: Current medicines are reviewed at length with the patient today.  Concerns regarding medicines are outlined above.  Tests Ordered: Orders Placed This Encounter  Procedures  . HOLTER MONITOR - 24 HOUR  . ECHOCARDIOGRAM COMPLETE   Medication Changes: Meds ordered this encounter  Medications  . amLODipine (NORVASC) 5 MG tablet    Sig: Take 1 tablet (5 mg total) by mouth daily.    Dispense:  90 tablet    Refill:  3  . atorvastatin (LIPITOR) 80 MG tablet    Sig: TAKE 1 TABLET BY MOUTH ONCE DAILY AT  6PM    Dispense:  90 tablet    Refill:  3  . losartan (COZAAR) 100 MG tablet    Sig: Take 1 tablet (100 mg total) by mouth daily.    Dispense:  90 tablet    Refill:  3    Please fill 90 day supply  . metoprolol tartrate (LOPRESSOR) 25 MG tablet    Sig: Take 0.5 tablets (12.5 mg total) by mouth 2 (two) times daily.    Dispense:  90 tablet    Refill:  3    Signed, Richardson Dopp, PA-C  09/12/2018 12:50 PM    [facility] [place], [place], [place]  [address] Phone: [phone]; Fax: [phone]

## 2018-09-12 ENCOUNTER — Ambulatory Visit: Payer: Medicare Other | Admitting: Physician Assistant

## 2018-09-12 ENCOUNTER — Encounter: Payer: Self-pay | Admitting: Physician Assistant

## 2018-09-12 ENCOUNTER — Other Ambulatory Visit: Payer: Self-pay

## 2018-09-12 ENCOUNTER — Ambulatory Visit (INDEPENDENT_AMBULATORY_CARE_PROVIDER_SITE_OTHER): Payer: Medicare Other | Admitting: Physician Assistant

## 2018-09-12 VITALS — BP 132/68 | HR 60 | Ht 65.0 in | Wt 144.0 lb

## 2018-09-12 DIAGNOSIS — R55 Syncope and collapse: Secondary | ICD-10-CM

## 2018-09-12 DIAGNOSIS — I2510 Atherosclerotic heart disease of native coronary artery without angina pectoris: Secondary | ICD-10-CM

## 2018-09-12 DIAGNOSIS — I10 Essential (primary) hypertension: Secondary | ICD-10-CM | POA: Diagnosis not present

## 2018-09-12 DIAGNOSIS — E785 Hyperlipidemia, unspecified: Secondary | ICD-10-CM | POA: Diagnosis not present

## 2018-09-12 DIAGNOSIS — M545 Low back pain, unspecified: Secondary | ICD-10-CM

## 2018-09-12 MED ORDER — LOSARTAN POTASSIUM 100 MG PO TABS: 100.0000 mg | ORAL_TABLET | Freq: Every day | ORAL | 3 refills | Status: DC

## 2018-09-12 MED ORDER — METOPROLOL TARTRATE 25 MG PO TABS: 12.5000 mg | ORAL_TABLET | Freq: Two times a day (BID) | ORAL | 3 refills | Status: DC

## 2018-09-12 MED ORDER — ATORVASTATIN CALCIUM 80 MG PO TABS: ORAL_TABLET | ORAL | 3 refills | Status: DC

## 2018-09-12 MED ORDER — AMLODIPINE BESYLATE 5 MG PO TABS: 5.0000 mg | ORAL_TABLET | Freq: Every day | ORAL | 3 refills | Status: DC

## 2018-09-12 NOTE — Patient Instructions (Signed)
Medication Instructions:   Your physician recommends that you continue on your current medications as directed. Please refer to the Current Medication list given to you today.   If you need a refill on your cardiac medications before your next appointment, please call your pharmacy.   Lab work: NONE ORDERED  TODAY   If you have labs (blood work) drawn today and your tests are completely normal, you will receive your results only by: . MyChart Message (if you have MyChart) OR . A paper copy in the mail If you have any lab test that is abnormal or we need to change your treatment, we will call you to review the results.  Testing/Procedures: Your physician has requested that you have an echocardiogram. Echocardiography is a painless test that uses sound waves to create images of your heart. It provides your doctor with information about the size and shape of your heart and how well your heart's chambers and valves are working. This procedure takes approximately one hour. There are no restrictions for this procedure.  Your physician has recommended that you wear a holter monitor. Holter monitors are medical devices that record the heart's electrical activity. Doctors most often use these monitors to diagnose arrhythmias. Arrhythmias are problems with the speed or rhythm of the heartbeat. The monitor is a small, portable device. You can wear one while you do your normal daily activities. This is usually used to diagnose what is causing palpitations/syncope (passing out).  Follow-Up: At [facility], you and your health needs are our priority.  As part of our continuing mission to provide you with exceptional heart care, we have created designated Provider Care Teams.  These Care Teams include your primary Cardiologist (physician) and Advanced Practice Providers (APPs -  Physician Assistants and Nurse Practitioners) who all work together to provide you with the care you need, when you need it. You  will need a follow up appointment in:  6 months.  Please call our office 2 months in advance to schedule this appointment.  You may see Michael Cooper, MD or one of the following Advanced Practice Providers on your designated Care Team: Scott Weaver, PA-C Vin Bhagat, PA-C . Janine Hammond, NP    Any Other Special Instructions Will Be Listed Below (If Applicable).   

## 2018-09-13 ENCOUNTER — Other Ambulatory Visit: Payer: Self-pay | Admitting: Family Medicine

## 2018-09-14 ENCOUNTER — Ambulatory Visit: Payer: Medicare Other | Admitting: Physician Assistant

## 2018-09-17 DIAGNOSIS — M545 Low back pain: Secondary | ICD-10-CM | POA: Diagnosis not present

## 2018-09-26 ENCOUNTER — Encounter: Payer: Self-pay | Admitting: Physician Assistant

## 2018-09-26 ENCOUNTER — Other Ambulatory Visit: Payer: Self-pay

## 2018-09-26 ENCOUNTER — Ambulatory Visit (HOSPITAL_COMMUNITY): Payer: Medicare Other | Attending: Cardiovascular Disease

## 2018-09-26 DIAGNOSIS — I2510 Atherosclerotic heart disease of native coronary artery without angina pectoris: Secondary | ICD-10-CM

## 2018-09-26 DIAGNOSIS — R55 Syncope and collapse: Secondary | ICD-10-CM | POA: Diagnosis not present

## 2018-09-27 DIAGNOSIS — H268 Other specified cataract: Secondary | ICD-10-CM | POA: Diagnosis not present

## 2018-09-27 DIAGNOSIS — H25011 Cortical age-related cataract, right eye: Secondary | ICD-10-CM | POA: Diagnosis not present

## 2018-09-27 DIAGNOSIS — H5703 Miosis: Secondary | ICD-10-CM | POA: Diagnosis not present

## 2018-09-28 ENCOUNTER — Telehealth: Payer: Self-pay | Admitting: *Deleted

## 2018-09-28 NOTE — Telephone Encounter (Signed)
Lvm of normal results and reccomendations

## 2018-10-01 ENCOUNTER — Telehealth: Payer: Self-pay | Admitting: *Deleted

## 2018-10-01 DIAGNOSIS — M5416 Radiculopathy, lumbar region: Secondary | ICD-10-CM | POA: Insufficient documentation

## 2018-10-01 NOTE — Telephone Encounter (Signed)
Using Arabic interpretor LMVM.  ZIO XT monitor to be mailed to home.  Once received, abrade skin upper left chest with sandpaper, wipe clean with alcohol and let dry.  Peel back off of sticker and apply to left chest as indicated in the picture, remove outer paper.  Click button to start recorder.  A  Green light will flash 2-3 times indicating monitor started.  Remove monitor after 24 hours and stick to back cover of patient log book.  Put log book in orange box and tape shut.  Put orange box in mail the same day or next day after monitor removed.  Postage is prepaid.  Dr. Burt Knack will have your results 5-7 days after you mail it back.

## 2018-10-02 MED FILL — OFLOXACIN 0.3 % SOLN: 0.3 | 18 days supply | Qty: 5 | Fill #1

## 2018-10-02 MED FILL — BETAMETHASONE DP 0.05% CRM: 0.05 | 14 days supply | Qty: 45 | Fill #1

## 2018-10-02 MED FILL — PREDNISOLONE AC 1% EYE DROP: 1 | 37 days supply | Qty: 10 | Fill #1

## 2018-10-09 ENCOUNTER — Encounter: Payer: Self-pay | Admitting: Nurse Practitioner

## 2018-10-09 ENCOUNTER — Ambulatory Visit: Payer: Medicare Other | Attending: Nurse Practitioner | Admitting: Nurse Practitioner

## 2018-10-09 ENCOUNTER — Other Ambulatory Visit: Payer: Self-pay

## 2018-10-09 VITALS — BP 147/82 | HR 60 | Temp 98.3°F | Ht 65.0 in | Wt 145.0 lb

## 2018-10-09 DIAGNOSIS — D649 Anemia, unspecified: Secondary | ICD-10-CM

## 2018-10-09 DIAGNOSIS — Z7982 Long term (current) use of aspirin: Secondary | ICD-10-CM | POA: Insufficient documentation

## 2018-10-09 DIAGNOSIS — Z888 Allergy status to other drugs, medicaments and biological substances status: Secondary | ICD-10-CM | POA: Insufficient documentation

## 2018-10-09 DIAGNOSIS — Z794 Long term (current) use of insulin: Secondary | ICD-10-CM | POA: Diagnosis not present

## 2018-10-09 DIAGNOSIS — E1165 Type 2 diabetes mellitus with hyperglycemia: Secondary | ICD-10-CM | POA: Diagnosis not present

## 2018-10-09 DIAGNOSIS — I2510 Atherosclerotic heart disease of native coronary artery without angina pectoris: Secondary | ICD-10-CM | POA: Insufficient documentation

## 2018-10-09 DIAGNOSIS — I252 Old myocardial infarction: Secondary | ICD-10-CM | POA: Insufficient documentation

## 2018-10-09 DIAGNOSIS — E118 Type 2 diabetes mellitus with unspecified complications: Secondary | ICD-10-CM | POA: Diagnosis not present

## 2018-10-09 DIAGNOSIS — Z951 Presence of aortocoronary bypass graft: Secondary | ICD-10-CM | POA: Diagnosis not present

## 2018-10-09 DIAGNOSIS — Z79899 Other long term (current) drug therapy: Secondary | ICD-10-CM | POA: Diagnosis not present

## 2018-10-09 DIAGNOSIS — E785 Hyperlipidemia, unspecified: Secondary | ICD-10-CM | POA: Diagnosis not present

## 2018-10-09 DIAGNOSIS — Z833 Family history of diabetes mellitus: Secondary | ICD-10-CM | POA: Insufficient documentation

## 2018-10-09 DIAGNOSIS — I10 Essential (primary) hypertension: Secondary | ICD-10-CM | POA: Diagnosis not present

## 2018-10-09 DIAGNOSIS — M545 Low back pain, unspecified: Secondary | ICD-10-CM

## 2018-10-09 DIAGNOSIS — IMO0002 Reserved for concepts with insufficient information to code with codable children: Secondary | ICD-10-CM

## 2018-10-09 DIAGNOSIS — G8929 Other chronic pain: Secondary | ICD-10-CM | POA: Insufficient documentation

## 2018-10-09 LAB — POCT GLYCOSYLATED HEMOGLOBIN (HGB A1C): Hemoglobin A1C: 9.7 % — AB (ref 4.0–5.6)

## 2018-10-09 LAB — GLUCOSE, POCT (MANUAL RESULT ENTRY): POC Glucose: 203 mg/dl — AB (ref 70–99)

## 2018-10-09 MED ORDER — ATORVASTATIN CALCIUM 80 MG PO TABS: ORAL_TABLET | ORAL | 3 refills | Status: DC

## 2018-10-09 MED ORDER — AMLODIPINE BESYLATE 5 MG PO TABS: 5.0000 mg | ORAL_TABLET | Freq: Every day | ORAL | 3 refills | Status: DC

## 2018-10-09 MED ORDER — METOPROLOL TARTRATE 25 MG PO TABS: 12.5000 mg | ORAL_TABLET | Freq: Two times a day (BID) | ORAL | 3 refills | Status: DC

## 2018-10-09 MED ORDER — LOSARTAN POTASSIUM 100 MG PO TABS: 100.0000 mg | ORAL_TABLET | Freq: Every day | ORAL | 3 refills | Status: DC

## 2018-10-09 MED ORDER — DICLOFENAC SODIUM 75 MG PO TBEC: 75.0000 mg | DELAYED_RELEASE_TABLET | Freq: Two times a day (BID) | ORAL | 0 refills | Status: DC

## 2018-10-09 MED ORDER — JARDIANCE 25 MG PO TABS: 25.0000 mg | ORAL_TABLET | Freq: Every day | ORAL | 0 refills | Status: DC

## 2018-10-09 MED ORDER — METFORMIN HCL 1000 MG PO TABS: 1000.0000 mg | ORAL_TABLET | Freq: Two times a day (BID) | ORAL | 0 refills | Status: DC

## 2018-10-09 MED ORDER — ACCU-CHEK FASTCLIX LANCETS MISC: 2 refills | Status: DC

## 2018-10-09 MED ORDER — ASPIRIN EC 81 MG PO TBEC: 81.0000 mg | DELAYED_RELEASE_TABLET | Freq: Every day | ORAL | 3 refills | Status: DC

## 2018-10-09 MED ORDER — TRAMADOL HCL 50 MG PO TABS: 50.0000 mg | ORAL_TABLET | Freq: Every day | ORAL | 0 refills | Status: AC | PRN

## 2018-10-09 MED ORDER — ACCU-CHEK GUIDE ME W/DEVICE KIT: 1.0000 | PACK | Freq: Three times a day (TID) | 0 refills | Status: DC

## 2018-10-09 MED ORDER — ACCU-CHEK GUIDE VI STRP: ORAL_STRIP | 2 refills | Status: DC

## 2018-10-09 MED FILL — DICLOFENAC SOD EC 75 MG TAB: 75 | 15 days supply | Qty: 30 | Fill #0

## 2018-10-09 MED FILL — traMADol HCL 50 MG TABS: 50 | 7 days supply | Qty: 7 | Fill #0

## 2018-10-09 MED FILL — ATORVASTATIN 80 MG TABLET: 80 | 30 days supply | Qty: 30 | Fill #0

## 2018-10-09 NOTE — Progress Notes (Signed)
Assessment & Plan:  Buzz was seen today for follow-up.  Diagnoses and all orders for this visit:  Diabetes mellitus type 2, uncontrolled, with complications (HCC) -     Glucose (CBG) -     HgB A1c -     CMP14+EGFR -     metFORMIN (GLUCOPHAGE) 1000 MG tablet; Take 1 tablet (1,000 mg total) by mouth 2 (two) times daily with a meal. -     empagliflozin (JARDIANCE) 25 MG TABS tablet; Take 25 mg by mouth daily. -     Accu-Chek FastClix Lancets MISC; Use as instructed to check blood sugar 3 times weekly before breakfast. E11.9 -     Blood Glucose Monitoring Suppl (ACCU-CHEK GUIDE ME) w/Device KIT; 1 kit by Does not apply route 3 (three) times daily. Use to check blood sugar 3 times daily. E11.9 -     glucose blood (ACCU-CHEK GUIDE) test strip; Use as instructed to check blood sugar 3 times weekly before breakfast. E11.9  Low back pain, unspecified back pain laterality, unspecified chronicity, unspecified whether sciatica present -     atorvastatin (LIPITOR) 80 MG tablet; TAKE 1 TABLET BY MOUTH ONCE DAILY AT  6PM -     diclofenac (VOLTAREN) 75 MG EC tablet; Take 1 tablet (75 mg total) by mouth 2 (two) times daily.  Essential hypertension -     CMP14+EGFR -     aspirin EC 81 MG tablet; Take 1 tablet (81 mg total) by mouth daily. -     amLODipine (NORVASC) 5 MG tablet; Take 1 tablet (5 mg total) by mouth daily. -     losartan (COZAAR) 100 MG tablet; Take 1 tablet (100 mg total) by mouth daily. -     metoprolol tartrate (LOPRESSOR) 25 MG tablet; Take 0.5 tablets (12.5 mg total) by mouth 2 (two) times daily.  Anemia, unspecified type -     CBC    Patient has been counseled on age-appropriate routine health concerns for screening and prevention. These are reviewed and up-to-date. Referrals have been placed accordingly. Immunizations are up-to-date or declined.    Subjective:   Chief Complaint  Patient presents with  . Follow-up    Pt. is here following up on diabetes.    HPI  Carl Clark 68 y.o. male presents to office today for follow up to DM, HTN and HPL.  He also has complaints of chronic low back pain today. He will be going to [place] in September for 6 months and will need refills of his medications for that time period. I have instructed him that I am not sure if his insurance plan will cover 6 months but I will send the refills as requested in September. He will contact me through mychart to let me know when to send.   DM TYPE 2 Chronic and not well controlled however improving. He states he lost his meter and has not been able to monitor his blood glucose levels at home in a few months. He has only been admininstered 25 units of Levemir twice daily.  I instructed him that his dosage has been prescribed as 30 units twice daily and he verbalized understanding.  He is to continue on metformin 1000 mg twice daily and Jardiance 25 mg daily.  He is taking an oral and statin.  Up-to-date with eye exam and denies any hypoglycemic symptoms.  He is not diet or exercise adherent. Lab Results  Component Value Date   HGBA1C 9.7 (A) 10/09/2018   Lab  Results  Component Value Date   HGBA1C 11.0 (A) 07/09/2018    ESSENTIAL HYPERTENSION He has not taken his antihypertensives this morning.  Endorses medication compliance with amlodipine 5 mg, losartan 100 mg and metoprolol 12.5 mg twice daily. Denies chest pain, shortness of breath, palpitations, lightheadedness, dizziness, headaches or BLE edema.  He does not monitor his blood pressure at home as he does not have a device.  BP Readings from Last 3 Encounters:  10/09/18 (!) 147/82  09/12/18 132/68  07/24/18 109/67    HYPERLIPIDEMIA Taking atorvastatin 80 mg daily as prescribed.  He denies any statin intolerance or myalgias. LDL at goal of less than 70. Lab Results  Component Value Date   LDLCALC 47 09/05/2018   Chronic low back pain Pain has been ongoing for well over a year.  He denies any injury or  trauma to his back.  There are no GU symptoms such as bowel or bladder incontinence.  There is no sciatica, numbness or weakness in the bilateral lower extremities.  Aggravating factors are prolonged sitting or standing, bending, or heavy lifting.  Relieving factors rest. 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 Mild congenital and acquired stenosis. Central protrusion at L4-5, also with mild facet arthropathy and short pedicles. It is unclear if BILATERAL L5 neural impingement is Symptomatic.   Review of Systems  Constitutional: Negative for fever, malaise/fatigue and weight loss.  HENT: Negative.  Negative for nosebleeds.   Eyes: Negative.  Negative for blurred vision, double vision and photophobia.  Respiratory: Negative.  Negative for cough and shortness of breath.   Cardiovascular: Negative.  Negative for chest pain, palpitations and leg swelling.  Gastrointestinal: Negative.  Negative for heartburn, nausea and vomiting.  Musculoskeletal: Positive for back pain and joint pain. Negative for myalgias.  Neurological: Negative.  Negative for dizziness, focal weakness, seizures and headaches.  Psychiatric/Behavioral: Negative.  Negative for suicidal ideas.    Past Medical History:  Diagnosis Date  . CAD (coronary artery disease) 03/16/2017   S/p NSTEMI 6/18 >> LHC: multivessel CAD >> s/p CABG // Echo 6/18: Mild LVH, EF 60-65, normal wall motion, grade 1 diastolic dysfunction, normal RV SF, PASP 36 (borderline pulmonary hypertension) // Pre-CABG Dopplers 09/13/16: Bilateral ICA 1-39  . Echocardiogram    Echo 09/2018: EF 60-65, normal wall motion, RVSP 24.9  . Family history of anesthesia complication    "never had anesthesia" (07/14/2012)  . History of non-ST elevation myocardial infarction (NSTEMI) 09/12/2016   Treated with  CABG  . History of rectal bleeding    secondary to diverticulum affecting right hepatic flexure - 06/2012  . Hypertension   . Mild carotid artery disease (HCC)    a. 1-39% bilaterally preCABG in 2018.  . Type II diabetes mellitus ([place])     Past Surgical History:  Procedure Laterality Date  . COLONOSCOPY Left 07/14/2012   Procedure: COLONOSCOPY;  Surgeon: Arta Silence, MD;  Location: [facility] ENDOSCOPY;  Service: Endoscopy;  Laterality: Left;  . CORONARY ARTERY BYPASS GRAFT N/A 09/15/2016   Procedure: CORONARY ARTERY BYPASS GRAFTING (CABG) x 3, LIMA to LAD, SVG to DIAGONAL, SVG to OM2, USING LEFT MAMMARY ARTERY AND RIGHT GREATER SAPHENOUS VEIN HARVESTED ENDOSCOPICALLY;  Surgeon: Grace Isaac, MD;  Location: [facility];  Service: Open Heart Surgery;  Laterality: N/A;  . ESOPHAGOGASTRODUODENOSCOPY N/A 07/13/2012   Procedure: ESOPHAGOGASTRODUODENOSCOPY (EGD);  Surgeon:  Arta Silence, MD;  Location: [facility] ENDOSCOPY;  Service: Endoscopy;  Laterality: N/A;  pt in ED A8  . LEFT HEART CATH AND CORONARY ANGIOGRAPHY N/A 09/12/2016   Procedure: Left Heart Cath and Coronary Angiography;  Surgeon: Sherren Mocha, MD;  Location: [facility];  Service: Cardiovascular;  Laterality: N/A;  . NO PAST SURGERIES    . TEE WITHOUT CARDIOVERSION N/A 09/15/2016   Procedure: TRANSESOPHAGEAL ECHOCARDIOGRAM (TEE);  Surgeon: Grace Isaac, MD;  Location: [facility];  Service: Open Heart Surgery;  Laterality: N/A;    Family History  Problem Relation Age of Onset  . Diabetes Mother   . Diabetes Father     Social History Reviewed with no changes to be made today.   Outpatient Medications Prior to Visit  Medication Sig Dispense Refill  . aspirin 81 MG tablet Take 1 tablet (81 mg total) by mouth daily. 30 tablet 12  . diclofenac sodium (VOLTAREN) 1 % GEL Apply 2 g topically 4 (four) times daily as needed. 100 g 0  . fluticasone (FLONASE) 50 MCG/ACT nasal spray Place 2 sprays into both nostrils daily. 16 g 6  .  Insulin Detemir (LEVEMIR FLEXTOUCH) 100 UNIT/ML Pen Inject 30 Units into the skin 2 (two) times daily. 30 mL 1  . Insulin Pen Needle (TRUEPLUS PEN NEEDLES) 32G X 4 MM MISC Use as directed to inject. 100 each 11  . ketoconazole (NIZORAL) 2 % shampoo Apply 1 application topically 2 (two) times a week. 120 mL 2  . Misc. Devices MISC Please provide patient with an insurance approved QUAD CANE 1 each 0  . ACCU-CHEK FASTCLIX LANCETS MISC Use as instructed to check blood sugar 3 times weekly before breakfast. E11.9 102 each 2  . amLODipine (NORVASC) 5 MG tablet Take 1 tablet (5 mg total) by mouth daily. 90 tablet 3  . Blood Glucose Monitoring Suppl (ACCU-CHEK GUIDE ME) w/Device KIT 1 kit by Does not apply route 3 (three) times daily. Use to check blood sugar 3 times daily. E11.9 1 kit 0  . glucose blood (ACCU-CHEK GUIDE) test strip Use as instructed to check blood sugar 3 times weekly before breakfast. E11.9 100 each 2  . JARDIANCE 25 MG TABS tablet Take 1 tablet by mouth once daily 90 tablet 0  . losartan (COZAAR) 100 MG tablet Take 1 tablet (100 mg total) by mouth daily. 90 tablet 3  . metFORMIN (GLUCOPHAGE) 1000 MG tablet TAKE 1 TABLET BY MOUTH TWICE DAILY WITH MEALS 180 tablet 0  . metoprolol tartrate (LOPRESSOR) 25 MG tablet Take 0.5 tablets (12.5 mg total) by mouth 2 (two) times daily. 90 tablet 3  . betamethasone dipropionate (DIPROLENE) 0.05 % cream Apply topically 2 (two) times daily. (Patient not taking: Reported on 10/09/2018) 90 g 0  . fluocinonide cream (LIDEX) 0.97 % Apply 1 application topically 2 (two) times daily. (Patient not taking: Reported on 10/09/2018) 120 g 0  . atorvastatin (LIPITOR) 80 MG tablet TAKE 1 TABLET BY MOUTH ONCE DAILY AT  6PM (Patient not taking: Reported on 10/09/2018) 90 tablet 3   No facility-administered medications prior to visit.     Allergies  Allergen Reactions  . Victoza [Liraglutide] Swelling    Lip swelling, itching, rash       Objective:    BP (!)  147/82 (BP Location: Right Arm, Patient Position: Sitting, Cuff Size: Normal)   Pulse 60   Temp 98.3 F (36.8 C) (Oral)   Ht '5\' 5"'  (1.651 m)   Wt 145  lb (65.8 kg)   SpO2 97%   BMI 24.13 kg/m  Wt Readings from Last 3 Encounters:  10/09/18 145 lb (65.8 kg)  09/12/18 144 lb (65.3 kg)  07/24/18 136 lb 3.2 oz (61.8 kg)    Physical Exam Vitals signs and nursing note reviewed.  Constitutional:      Appearance: He is well-developed.  HENT:     Head: Normocephalic and atraumatic.  Neck:     Musculoskeletal: Normal range of motion.  Cardiovascular:     Rate and Rhythm: Normal rate and regular rhythm.     Heart sounds: Normal heart sounds. No murmur. No friction rub. No gallop.   Pulmonary:     Effort: Pulmonary effort is normal. No tachypnea or respiratory distress.     Breath sounds: Normal breath sounds. No decreased breath sounds, wheezing, rhonchi or rales.  Chest:     Chest wall: No tenderness.  Abdominal:     General: Bowel sounds are normal.     Palpations: Abdomen is soft.  Musculoskeletal: Normal range of motion.     Right shoulder: He exhibits tenderness. He exhibits normal range of motion, no bony tenderness and no swelling.     Lumbar back: He exhibits tenderness. Bony tenderness: with moderate palpation        Back:  Skin:    General: Skin is warm and dry.  Neurological:     Mental Status: He is alert and oriented to person, place, and time.     Coordination: Coordination normal.  Psychiatric:        Behavior: Behavior normal. Behavior is cooperative.        Thought Content: Thought content normal.        Judgment: Judgment normal.         Patient has been counseled extensively about nutrition and exercise as well as the importance of adherence with medications and regular follow-up. The patient was given clear instructions to go to ER or return to medical center if symptoms don't improve, worsen or new problems develop. The patient verbalized understanding.    Follow-up: Return in about 3 months (around 01/09/2019).   Gildardo Pounds, FNP-BC [facility] [place], [place]   10/09/2018, 12:55 PM

## 2018-10-10 DIAGNOSIS — M5416 Radiculopathy, lumbar region: Secondary | ICD-10-CM | POA: Diagnosis not present

## 2018-10-10 LAB — CMP14+EGFR
ALT: 19 IU/L (ref 0–44)
AST: 14 IU/L (ref 0–40)
Albumin/Globulin Ratio: 1.1 — ABNORMAL LOW (ref 1.2–2.2)
Albumin: 4.3 g/dL (ref 3.8–4.8)
Alkaline Phosphatase: 81 IU/L (ref 39–117)
BUN/Creatinine Ratio: 16 (ref 10–24)
BUN: 17 mg/dL (ref 8–27)
Bilirubin Total: 0.3 mg/dL (ref 0.0–1.2)
CO2: 21 mmol/L (ref 20–29)
Calcium: 9.3 mg/dL (ref 8.6–10.2)
Chloride: 102 mmol/L (ref 96–106)
Creatinine, Ser: 1.05 mg/dL (ref 0.76–1.27)
GFR calc Af Amer: 84 mL/min/{1.73_m2} (ref >59)
GFR calc non Af Amer: 73 mL/min/{1.73_m2} (ref >59)
Globulin, Total: 3.9 g/dL (ref 1.5–4.5)
Glucose: 220 mg/dL — ABNORMAL HIGH (ref 65–99)
Potassium: 4.4 mmol/L (ref 3.5–5.2)
Sodium: 138 mmol/L (ref 134–144)
Total Protein: 8.2 g/dL (ref 6.0–8.5)

## 2018-10-10 LAB — CBC
Hematocrit: 38.9 % (ref 37.5–51.0)
Hemoglobin: 12.7 g/dL — ABNORMAL LOW (ref 13.0–17.7)
MCH: 31.4 pg (ref 26.6–33.0)
MCHC: 32.6 g/dL (ref 31.5–35.7)
MCV: 96 fL (ref 79–97)
Platelets: 266 10*3/uL (ref 150–450)
RBC: 4.04 x10E6/uL — ABNORMAL LOW (ref 4.14–5.80)
RDW: 13.3 % (ref 11.6–15.4)
WBC: 3.6 10*3/uL (ref 3.4–10.8)

## 2018-10-18 DIAGNOSIS — M5416 Radiculopathy, lumbar region: Secondary | ICD-10-CM | POA: Diagnosis not present

## 2018-10-21 DIAGNOSIS — H2512 Age-related nuclear cataract, left eye: Secondary | ICD-10-CM | POA: Diagnosis not present

## 2018-10-22 DIAGNOSIS — M5416 Radiculopathy, lumbar region: Secondary | ICD-10-CM | POA: Diagnosis not present

## 2018-10-23 ENCOUNTER — Other Ambulatory Visit: Payer: Self-pay | Admitting: Nurse Practitioner

## 2018-10-23 DIAGNOSIS — IMO0002 Reserved for concepts with insufficient information to code with codable children: Secondary | ICD-10-CM

## 2018-10-23 DIAGNOSIS — E1165 Type 2 diabetes mellitus with hyperglycemia: Secondary | ICD-10-CM

## 2018-10-25 DIAGNOSIS — H2512 Age-related nuclear cataract, left eye: Secondary | ICD-10-CM | POA: Diagnosis not present

## 2018-10-29 DIAGNOSIS — M5416 Radiculopathy, lumbar region: Secondary | ICD-10-CM | POA: Diagnosis not present

## 2018-10-31 DIAGNOSIS — M5416 Radiculopathy, lumbar region: Secondary | ICD-10-CM | POA: Diagnosis not present

## 2018-11-05 DIAGNOSIS — M5416 Radiculopathy, lumbar region: Secondary | ICD-10-CM | POA: Diagnosis not present

## 2018-11-07 DIAGNOSIS — M5416 Radiculopathy, lumbar region: Secondary | ICD-10-CM | POA: Diagnosis not present

## 2018-11-13 DIAGNOSIS — M545 Low back pain: Secondary | ICD-10-CM | POA: Diagnosis not present

## 2018-11-13 DIAGNOSIS — M5416 Radiculopathy, lumbar region: Secondary | ICD-10-CM | POA: Diagnosis not present

## 2018-11-20 DIAGNOSIS — M5416 Radiculopathy, lumbar region: Secondary | ICD-10-CM | POA: Diagnosis not present

## 2018-11-30 ENCOUNTER — Encounter: Payer: Self-pay | Admitting: Nurse Practitioner

## 2018-11-30 ENCOUNTER — Other Ambulatory Visit: Payer: Self-pay

## 2018-11-30 ENCOUNTER — Ambulatory Visit: Payer: Medicare Other | Attending: Nurse Practitioner | Admitting: Nurse Practitioner

## 2018-11-30 VITALS — BP 119/62 | HR 65 | Temp 98.6°F | Ht 65.0 in | Wt 146.0 lb

## 2018-11-30 DIAGNOSIS — Z951 Presence of aortocoronary bypass graft: Secondary | ICD-10-CM | POA: Diagnosis not present

## 2018-11-30 DIAGNOSIS — I10 Essential (primary) hypertension: Secondary | ICD-10-CM | POA: Insufficient documentation

## 2018-11-30 DIAGNOSIS — Z79899 Other long term (current) drug therapy: Secondary | ICD-10-CM | POA: Diagnosis not present

## 2018-11-30 DIAGNOSIS — E1165 Type 2 diabetes mellitus with hyperglycemia: Secondary | ICD-10-CM | POA: Insufficient documentation

## 2018-11-30 DIAGNOSIS — Z791 Long term (current) use of non-steroidal anti-inflammatories (NSAID): Secondary | ICD-10-CM | POA: Diagnosis not present

## 2018-11-30 DIAGNOSIS — I252 Old myocardial infarction: Secondary | ICD-10-CM | POA: Diagnosis not present

## 2018-11-30 DIAGNOSIS — Z7982 Long term (current) use of aspirin: Secondary | ICD-10-CM | POA: Diagnosis not present

## 2018-11-30 DIAGNOSIS — IMO0002 Reserved for concepts with insufficient information to code with codable children: Secondary | ICD-10-CM

## 2018-11-30 DIAGNOSIS — Z833 Family history of diabetes mellitus: Secondary | ICD-10-CM | POA: Insufficient documentation

## 2018-11-30 DIAGNOSIS — I25810 Atherosclerosis of coronary artery bypass graft(s) without angina pectoris: Secondary | ICD-10-CM | POA: Insufficient documentation

## 2018-11-30 DIAGNOSIS — Z888 Allergy status to other drugs, medicaments and biological substances status: Secondary | ICD-10-CM | POA: Insufficient documentation

## 2018-11-30 DIAGNOSIS — E782 Mixed hyperlipidemia: Secondary | ICD-10-CM | POA: Diagnosis not present

## 2018-11-30 DIAGNOSIS — E118 Type 2 diabetes mellitus with unspecified complications: Secondary | ICD-10-CM

## 2018-11-30 DIAGNOSIS — Z794 Long term (current) use of insulin: Secondary | ICD-10-CM | POA: Diagnosis not present

## 2018-11-30 LAB — GLUCOSE, POCT (MANUAL RESULT ENTRY): POC Glucose: 288 mg/dl — AB (ref 70–99)

## 2018-11-30 MED ORDER — JARDIANCE 25 MG PO TABS: 25.0000 mg | ORAL_TABLET | Freq: Every day | ORAL | 0 refills | Status: AC

## 2018-11-30 MED ORDER — LEVEMIR FLEXTOUCH 100 UNIT/ML ~~LOC~~ SOPN: 30.0000 [IU] | PEN_INJECTOR | Freq: Two times a day (BID) | SUBCUTANEOUS | 1 refills | Status: DC

## 2018-11-30 MED ORDER — METFORMIN HCL 1000 MG PO TABS: 1000.0000 mg | ORAL_TABLET | Freq: Two times a day (BID) | ORAL | 0 refills | Status: DC

## 2018-11-30 MED ORDER — METOPROLOL TARTRATE 25 MG PO TABS: 12.5000 mg | ORAL_TABLET | Freq: Two times a day (BID) | ORAL | 0 refills | Status: DC

## 2018-11-30 MED ORDER — ASPIRIN EC 81 MG PO TBEC: 81.0000 mg | DELAYED_RELEASE_TABLET | Freq: Every day | ORAL | 0 refills | Status: AC

## 2018-11-30 MED ORDER — ATORVASTATIN CALCIUM 80 MG PO TABS: ORAL_TABLET | ORAL | 3 refills | Status: DC

## 2018-11-30 MED ORDER — ACCU-CHEK GUIDE VI STRP: ORAL_STRIP | 6 refills | Status: DC

## 2018-11-30 MED ORDER — AMLODIPINE BESYLATE 5 MG PO TABS: 5.0000 mg | ORAL_TABLET | Freq: Every day | ORAL | 0 refills | Status: DC

## 2018-11-30 MED ORDER — LOSARTAN POTASSIUM 100 MG PO TABS: 100.0000 mg | ORAL_TABLET | Freq: Every day | ORAL | 0 refills | Status: DC

## 2018-11-30 MED ORDER — TRUEPLUS PEN NEEDLES 32G X 4 MM MISC: 11 refills | Status: DC

## 2018-11-30 MED ORDER — ACCU-CHEK FASTCLIX LANCETS MISC: 6 refills | Status: DC

## 2018-11-30 MED FILL — TRUEPLUS PEN NDL 32GX5/32: 32G X 4 MM | 90 days supply | Qty: 300 | Fill #0

## 2018-11-30 MED FILL — AMLODIPINE BESYLATE 5 MG TA: 5 | 90 days supply | Qty: 90 | Fill #0

## 2018-11-30 MED FILL — metFORMIN HCL 1000 MG TABS: 1000 | 90 days supply | Qty: 180 | Fill #0

## 2018-11-30 MED FILL — LOSARTAN POTASSIUM 100 MG T: 100 | 90 days supply | Qty: 90 | Fill #0

## 2018-11-30 MED FILL — JARDIANCE 25 MG TABLET: 25 | 90 days supply | Qty: 90 | Fill #0

## 2018-11-30 MED FILL — ATORVASTATIN 80 MG TABLET: 80 | 90 days supply | Qty: 90 | Fill #0

## 2018-11-30 MED FILL — LEVEMIR FLEXTOUCH 100 UNITS: 100 | 20 days supply | Qty: 12 | Fill #0

## 2018-11-30 MED FILL — METOPROLOL TARTRATE 25 MG T: 25 | 90 days supply | Qty: 90 | Fill #0

## 2018-11-30 NOTE — Progress Notes (Signed)
Assessment & Plan:  Carl Clark was seen today for follow-up.  Diagnoses and all orders for this visit:  Essential hypertension -     amLODipine (NORVASC) 5 MG tablet; Take 1 tablet (5 mg total) by mouth daily. Patient requests 6 month supply -     aspirin EC 81 MG tablet; Take 1 tablet (81 mg total) by mouth daily. Patient requests 6 month supply -     losartan (COZAAR) 100 MG tablet; Take 1 tablet (100 mg total) by mouth daily. Patient requests 6 month supply -     metoprolol tartrate (LOPRESSOR) 25 MG tablet; Take 0.5 tablets (12.5 mg total) by mouth 2 (two) times daily. Patient requests 6 month supply Continue all antihypertensives as prescribed.  Remember to bring in your blood pressure log with you for your follow up appointment.  DASH/Mediterranean Diets are healthier choices for HTN.    Diabetes mellitus type 2, uncontrolled, with complications (HCC) -     atorvastatin (LIPITOR) 80 MG tablet; TAKE 1 TABLET BY MOUTH ONCE DAILY AT  6PM. Patient requests 6 month supply -     empagliflozin (JARDIANCE) 25 MG TABS tablet; Take 25 mg by mouth daily. Patient requests 6 month supply -     glucose blood (ACCU-CHEK GUIDE) test strip; Use as instructed to check blood sugar 3 times weekly before breakfast. E11.9. Patient requests 6 month supply -     Insulin Detemir (LEVEMIR FLEXTOUCH) 100 UNIT/ML Pen; Inject 30 Units into the skin 2 (two) times daily. Patient requests 6 month supply -     metFORMIN (GLUCOPHAGE) 1000 MG tablet; Take 1 tablet (1,000 mg total) by mouth 2 (two) times daily with a meal. Patient requests 6 month supply -     Insulin Pen Needle (TRUEPLUS PEN NEEDLES) 32G X 4 MM MISC; Use as instructed. Inject into the skin twice daily. Patient requests 6 month supply -     Accu-Chek FastClix Lancets MISC; Use as instructed to check blood sugar 3 times weekly before breakfast. E11.9. Patient requests 6 month supply -     Glucose (CBG) Diabetes is poorly controlled. Advised patient to  keep a fasting blood sugar log fast, 2 hours post lunch and bedtime which will be reviewed at the next office visit.  Mixed hyperlipidemia -     atorvastatin (LIPITOR) 80 MG tablet; TAKE 1 TABLET BY MOUTH ONCE DAILY AT  6PM. Patient requests 6 month supply LDL at goal of less than 70. Lab Results  Component Value Date   LDLCALC 47 09/05/2018  INSTRUCTIONS: Work on a low fat, heart healthy diet and participate in regular aerobic exercise program by working out at least 150 minutes per week; 5 days a week-30 minutes per day. Avoid red meat, fried foods. junk foods, sodas, sugary drinks, unhealthy snacking, alcohol and smoking.  Drink at least 48oz of water per day and monitor your carbohydrate intake daily.    Patient has been counseled on age-appropriate routine health concerns for screening and prevention. These are reviewed and up-to-date. Referrals have been placed accordingly. Immunizations are up-to-date or declined.    Subjective:   Chief Complaint  Patient presents with  . Follow-up    Pt. is requesting 6 months of all his medication due to he will be going to [place] for 7 months. He is leaving next week.    Carl Clark 68 y.o. male presents to office today for Medication refills. He was just seen several weeks ago and is requesting a  6 month supply of his medications as he is going home to [place] in a few weeks. Denies any concerns or complaints today.  Blood pressure is well controlled today. Denies chest pain, shortness of breath, palpitations, lightheadedness, dizziness, headaches or BLE edema.   BP Readings from Last 3 Encounters:  11/30/18 119/62  10/09/18 (!) 147/82  09/12/18 132/68    has a past medical history of CAD (coronary artery disease) (03/16/2017), Echocardiogram, Family history of anesthesia complication, History of non-ST elevation myocardial infarction (NSTEMI) (09/12/2016), History of rectal bleeding, Hypertension, Mild carotid artery disease  ([place]), and Type II diabetes mellitus ([place]).  Review of Systems  Constitutional: Negative for fever, malaise/fatigue and weight loss.  HENT: Negative.  Negative for nosebleeds.   Eyes: Negative.  Negative for blurred vision, double vision and photophobia.  Respiratory: Negative.  Negative for cough and shortness of breath.   Cardiovascular: Negative.  Negative for chest pain, palpitations and leg swelling.  Gastrointestinal: Negative.  Negative for heartburn, nausea and vomiting.  Musculoskeletal: Positive for back pain (chronic low back pain). Negative for myalgias.  Neurological: Negative.  Negative for dizziness, focal weakness, seizures and headaches.  Psychiatric/Behavioral: Negative.  Negative for suicidal ideas.    Past Medical History:  Diagnosis Date  . CAD (coronary artery disease) 03/16/2017   S/p NSTEMI 6/18 >> LHC: multivessel CAD >> s/p CABG // Echo 6/18: Mild LVH, EF 60-65, normal wall motion, grade 1 diastolic dysfunction, normal RV SF, PASP 36 (borderline pulmonary hypertension) // Pre-CABG Dopplers 09/13/16: Bilateral ICA 1-39  . Echocardiogram    Echo 09/2018: EF 60-65, normal wall motion, RVSP 24.9  . Family history of anesthesia complication    "never had anesthesia" (07/14/2012)  . History of non-ST elevation myocardial infarction (NSTEMI) 09/12/2016   Treated with CABG  . History of rectal bleeding    secondary to diverticulum affecting right hepatic flexure - 06/2012  . Hypertension   . Mild carotid artery disease (HCC)    a. 1-39% bilaterally preCABG in 2018.  . Type II diabetes mellitus ([place])     Past Surgical History:  Procedure Laterality Date  . COLONOSCOPY Left 07/14/2012   Procedure: COLONOSCOPY;  Surgeon: Arta Silence, MD;  Location[facility] ENDOSCOPY;  Service: Endoscopy;  Laterality: Left;  . CORONARY ARTERY BYPASS GRAFT N/A 09/15/2016   Procedure: CORONARY ARTERY BYPASS GRAFTING (CABG) x 3, LIMA to LAD, SVG to DIAGONAL, SVG to OM2, USING LEFT MAMMARY  ARTERY AND RIGHT GREATER SAPHENOUS VEIN HARVESTED ENDOSCOPICALLY;  Surgeon: Grace Isaac, MD;  Location: [place];  Service: Open Heart Surgery;  Laterality: N/A;  . ESOPHAGOGASTRODUODENOSCOPY N/A 07/13/2012   Procedure: ESOPHAGOGASTRODUODENOSCOPY (EGD);  Surgeon: Arta Silence, MD;  Location: [facility] ENDOSCOPY;  Service: Endoscopy;  Laterality: N/A;  pt in ED A8  . LEFT HEART CATH AND CORONARY ANGIOGRAPHY N/A 09/12/2016   Procedure: Left Heart Cath and Coronary Angiography;  Surgeon: Sherren Mocha, MD;  Location: [place] CV LAB;  Service: Cardiovascular;  Laterality: N/A;  . NO PAST SURGERIES    . TEE WITHOUT CARDIOVERSION N/A 09/15/2016   Procedure: TRANSESOPHAGEAL ECHOCARDIOGRAM (TEE);  Surgeon: Grace Isaac, MD;  Location: [place];  Service: Open Heart Surgery;  Laterality: N/A;    Family History  Problem Relation Age of Onset  . Diabetes Mother   . Diabetes Father     Social History Reviewed with no changes to be made today.   Outpatient Medications Prior to Visit  Medication Sig Dispense Refill  . Blood Glucose Monitoring Suppl (  ACCU-CHEK GUIDE ME) w/Device KIT 1 kit by Does not apply route 3 (three) times daily. Use to check blood sugar 3 times daily. E11.9 1 kit 0  . diclofenac (VOLTAREN) 75 MG EC tablet Take 1 tablet (75 mg total) by mouth 2 (two) times daily. 30 tablet 0  . fluticasone (FLONASE) 50 MCG/ACT nasal spray Place 2 sprays into both nostrils daily. 16 g 6  . ketoconazole (NIZORAL) 2 % shampoo Apply 1 application topically 2 (two) times a week. 120 mL 2  . Misc. Devices MISC Please provide patient with an insurance approved QUAD CANE 1 each 0  . Accu-Chek FastClix Lancets MISC Use as instructed to check blood sugar 3 times weekly before breakfast. E11.9 102 each 2  . amLODipine (NORVASC) 5 MG tablet Take 1 tablet (5 mg total) by mouth daily. 90 tablet 3  . aspirin 81 MG tablet Take 1 tablet (81 mg total) by mouth daily. 30 tablet 12  . aspirin EC 81 MG tablet Take  1 tablet (81 mg total) by mouth daily. 90 tablet 3  . atorvastatin (LIPITOR) 80 MG tablet TAKE 1 TABLET BY MOUTH ONCE DAILY AT  6PM 90 tablet 3  . diclofenac sodium (VOLTAREN) 1 % GEL Apply 2 g topically 4 (four) times daily as needed. 100 g 0  . empagliflozin (JARDIANCE) 25 MG TABS tablet Take 25 mg by mouth daily. 90 tablet 0  . glucose blood (ACCU-CHEK GUIDE) test strip Use as instructed to check blood sugar 3 times weekly before breakfast. E11.9 100 each 2  . Insulin Detemir (LEVEMIR FLEXTOUCH) 100 UNIT/ML Pen Inject 30 Units into the skin 2 (two) times daily. 30 mL 1  . Insulin Pen Needle (TRUEPLUS PEN NEEDLES) 32G X 4 MM MISC Use as directed to inject. 100 each 11  . losartan (COZAAR) 100 MG tablet Take 1 tablet (100 mg total) by mouth daily. 90 tablet 3  . metFORMIN (GLUCOPHAGE) 1000 MG tablet TAKE 1 TABLET BY MOUTH TWICE DAILY WITH MEALS 180 tablet 0  . metoprolol tartrate (LOPRESSOR) 25 MG tablet Take 0.5 tablets (12.5 mg total) by mouth 2 (two) times daily. 90 tablet 3  . betamethasone dipropionate (DIPROLENE) 0.05 % cream Apply topically 2 (two) times daily. (Patient not taking: Reported on 10/09/2018) 90 g 0  . fluocinonide cream (LIDEX) 8.14 % Apply 1 application topically 2 (two) times daily. (Patient not taking: Reported on 10/09/2018) 120 g 0   No facility-administered medications prior to visit.     Allergies  Allergen Reactions  . Victoza [Liraglutide] Swelling    Lip swelling, itching, rash       Objective:    BP 119/62 (BP Location: Left Arm, Patient Position: Sitting, Cuff Size: Normal)   Pulse 65   Temp 98.6 F (37 C) (Oral)   Ht '5\' 5"'  (1.651 m)   Wt 146 lb (66.2 kg)   SpO2 97%   BMI 24.30 kg/m  Wt Readings from Last 3 Encounters:  11/30/18 146 lb (66.2 kg)  10/09/18 145 lb (65.8 kg)  09/12/18 144 lb (65.3 kg)    Physical Exam Vitals signs and nursing note reviewed.  Constitutional:      Appearance: He is well-developed.  HENT:     Head: Normocephalic  and atraumatic.  Neck:     Musculoskeletal: Normal range of motion.  Cardiovascular:     Rate and Rhythm: Normal rate and regular rhythm.     Heart sounds: Normal heart sounds. No murmur. No friction rub. No  gallop.   Pulmonary:     Effort: Pulmonary effort is normal. No tachypnea or respiratory distress.     Breath sounds: Normal breath sounds. No decreased breath sounds, wheezing, rhonchi or rales.  Chest:     Chest wall: No tenderness.  Abdominal:     General: Bowel sounds are normal.     Palpations: Abdomen is soft.  Musculoskeletal: Normal range of motion.  Skin:    General: Skin is warm and dry.  Neurological:     Mental Status: He is alert and oriented to person, place, and time.     Coordination: Coordination normal.  Psychiatric:        Behavior: Behavior normal. Behavior is cooperative.        Thought Content: Thought content normal.        Judgment: Judgment normal.          Patient has been counseled extensively about nutrition and exercise as well as the importance of adherence with medications and regular follow-up. The patient was given clear instructions to go to ER or return to medical center if symptoms don't improve, worsen or new problems develop. The patient verbalized understanding.   Follow-up: No follow-ups on file.   Gildardo Pounds, FNP-BC [facility] and [facility], [place]   11/30/2018, 6:39 PM

## 2018-12-03 ENCOUNTER — Other Ambulatory Visit: Payer: Self-pay

## 2018-12-03 DIAGNOSIS — Z20822 Contact with and (suspected) exposure to covid-19: Secondary | ICD-10-CM

## 2018-12-04 LAB — NOVEL CORONAVIRUS, NAA: SARS-CoV-2, NAA: NOT DETECTED

## 2018-12-09 NOTE — Progress Notes (Signed)
Cardiology Office Note    Date:  12/10/2018   ID:  Carl Clark, Carl Clark 07-May-1950, MRN [id number]  PCP:  Gildardo Pounds, NP  Cardiologist: Dr. Burt Knack  Chief Complaint: follow up  History of Present Illness:   Pal Shell is a 68 y.o. male with hx of CAD s/p CABG  HTN, DM, HLD and syncope presents for follow up.  Hx of NSTEMI 08/2016 s/p CABG (LIMA-LAD, SVG-diagonal, SVG-OM). Also with hx of recurrent syncope, felt to be vasovagal. Seen by Richardson Dopp 08/2018>> recommended echo and monitor for syncope.   Echo 09/2018 showed normal LVEF without WM abnormality. No monitor.  He is going out of country to [place] for 6 months on next week.  He has no cardiac symptoms.  He denies chest pain, shortness of breath, palpitation, dizziness, orthopnea, PND, lower extremity edema or melena.  No recurrent syncope.   Past Medical History:  Diagnosis Date  . CAD (coronary artery disease) 03/16/2017   S/p NSTEMI 6/18 >> LHC: multivessel CAD >> s/p CABG // Echo 6/18: Mild LVH, EF 60-65, normal wall motion, grade 1 diastolic dysfunction, normal RV SF, PASP 36 (borderline pulmonary hypertension) // Pre-CABG Dopplers 09/13/16: Bilateral ICA 1-39  . Echocardiogram    Echo 09/2018: EF 60-65, normal wall motion, RVSP 24.9  . Family history of anesthesia complication    "never had anesthesia" (07/14/2012)  . History of non-ST elevation myocardial infarction (NSTEMI) 09/12/2016   Treated with CABG  . History of rectal bleeding    secondary to diverticulum affecting right hepatic flexure - 06/2012  . Hypertension   . Mild carotid artery disease (HCC)    a. 1-39% bilaterally preCABG in 2018.  . Type II diabetes mellitus ([place])     Past Surgical History:  Procedure Laterality Date  . COLONOSCOPY Left 07/14/2012   Procedure: COLONOSCOPY;  Surgeon: Arta Silence, MD;  Location: [facility] ENDOSCOPY;  Service: Endoscopy;  Laterality: Left;  . CORONARY ARTERY BYPASS GRAFT N/A 09/15/2016   Procedure: CORONARY ARTERY BYPASS GRAFTING (CABG) x 3, LIMA to LAD, SVG to DIAGONAL, SVG to OM2, USING LEFT MAMMARY ARTERY AND RIGHT GREATER SAPHENOUS VEIN HARVESTED ENDOSCOPICALLY;  Surgeon: Grace Isaac, MD;  Location: [place];  Service: Open Heart Surgery;  Laterality: N/A;  . ESOPHAGOGASTRODUODENOSCOPY N/A 07/13/2012   Procedure: ESOPHAGOGASTRODUODENOSCOPY (EGD);  Surgeon: Arta Silence, MD;  Location: [facility] ENDOSCOPY;  Service: Endoscopy;  Laterality: N/A;  pt in ED A8  . LEFT HEART CATH AND CORONARY ANGIOGRAPHY N/A 09/12/2016   Procedure: Left Heart Cath and Coronary Angiography;  Surgeon: Sherren Mocha, MD;  Location: [place] CV LAB;  Service: Cardiovascular;  Laterality: N/A;  . NO PAST SURGERIES    . TEE WITHOUT CARDIOVERSION N/A 09/15/2016   Procedure: TRANSESOPHAGEAL ECHOCARDIOGRAM (TEE);  Surgeon: Grace Isaac, MD;  Location: [facility];  Service: Open Heart Surgery;  Laterality: N/A;    Current Medications: Prior to Admission medications   Medication Sig Start Date End Date Taking? Authorizing Provider  Accu-Chek FastClix Lancets MISC Use as instructed to check blood sugar 3 times weekly before breakfast. E11.9. Patient requests 6 month supply 11/30/18   Gildardo Pounds, NP  amLODipine (NORVASC) 5 MG tablet Take 1 tablet (5 mg total) by mouth daily. Patient requests 6 month supply 11/30/18 05/29/19  Gildardo Pounds, NP  aspirin EC 81 MG tablet Take 1 tablet (81 mg total) by mouth daily. Patient requests 6 month supply 11/30/18 05/29/19  Gildardo Pounds, NP  atorvastatin (LIPITOR)  80 MG tablet TAKE 1 TABLET BY MOUTH ONCE DAILY AT  6PM. Patient requests 6 month supply 11/30/18   Gildardo Pounds, NP  betamethasone dipropionate (DIPROLENE) 0.05 % cream Apply topically 2 (two) times daily. Patient not taking: Reported on 10/09/2018 07/24/18   Gildardo Pounds, NP  Blood Glucose Monitoring Suppl (ACCU-CHEK GUIDE ME) w/Device KIT 1 kit by Does not apply route 3 (three) times daily. Use to  check blood sugar 3 times daily. E11.9 10/09/18   Gildardo Pounds, NP  diclofenac (VOLTAREN) 75 MG EC tablet Take 1 tablet (75 mg total) by mouth 2 (two) times daily. 10/09/18   Gildardo Pounds, NP  empagliflozin (JARDIANCE) 25 MG TABS tablet Take 25 mg by mouth daily. Patient requests 6 month supply 11/30/18 05/29/19  Gildardo Pounds, NP  fluocinonide cream (LIDEX) 0.35 % Apply 1 application topically 2 (two) times daily. Patient not taking: Reported on 10/09/2018 07/24/18   Gildardo Pounds, NP  fluticasone [facility]) 50 MCG/ACT nasal spray Place 2 sprays into both nostrils daily. 04/02/18   Gildardo Pounds, NP  glucose blood (ACCU-CHEK GUIDE) test strip Use as instructed to check blood sugar 3 times weekly before breakfast. E11.9. Patient requests 6 month supply 11/30/18   Gildardo Pounds, NP  Insulin Detemir (LEVEMIR FLEXTOUCH) 100 UNIT/ML Pen Inject 30 Units into the skin 2 (two) times daily. Patient requests 6 month supply 11/30/18 05/29/19  Gildardo Pounds, NP  Insulin Pen Needle (TRUEPLUS PEN NEEDLES) 32G X 4 MM MISC Use as instructed. Inject into the skin twice daily. Patient requests 6 month supply 11/30/18   Gildardo Pounds, NP  ketoconazole (NIZORAL) 2 % shampoo Apply 1 application topically 2 (two) times a week. 07/26/18   Gildardo Pounds, NP  losartan (COZAAR) 100 MG tablet Take 1 tablet (100 mg total) by mouth daily. Patient requests 6 month supply 11/30/18 05/29/19  Gildardo Pounds, NP  metFORMIN (GLUCOPHAGE) 1000 MG tablet Take 1 tablet (1,000 mg total) by mouth 2 (two) times daily with a meal. Patient requests 6 month supply 11/30/18 05/29/19  Gildardo Pounds, NP  metoprolol tartrate (LOPRESSOR) 25 MG tablet Take 0.5 tablets (12.5 mg total) by mouth 2 (two) times daily. Patient requests 6 month supply 11/30/18 05/29/19  Gildardo Pounds, NP  Misc. Devices MISC Please provide patient with an insurance approved QUAD CANE 08/10/17   Gildardo Pounds, NP    Allergies:   Victoza [liraglutide]    Social History   Socioeconomic History  . Marital status: Widowed    Spouse name: Not on file  . Number of children: Not on file  . Years of education: Not on file  . Highest education level: Not on file  Occupational History  . Not on file  Social Needs  . Financial resource strain: Not on file  . Food insecurity    Worry: Not on file    Inability: Not on file  . Transportation needs    Medical: Not on file    Non-medical: Not on file  Tobacco Use  . Smoking status: Never Smoker  . Smokeless tobacco: Never Used  Substance and Sexual Activity  . Alcohol use: No  . Drug use: No  . Sexual activity: Not Currently  Lifestyle  . Physical activity    Days per week: Not on file    Minutes per session: Not on file  . Stress: Not on file  Relationships  . Social connections  Talks on phone: Not on file    Gets together: Not on file    Attends religious service: Not on file    Active member of club or organization: Not on file    Attends meetings of clubs or organizations: Not on file    Relationship status: Not on file  Other Topics Concern  . Not on file  Social History Narrative  . Not on file     Family History:  The patient's family history includes Diabetes in his father and mother.   ROS:   Please see the history of present illness.    ROS All other systems reviewed and are negative.   PHYSICAL EXAM:   VS:  BP (!) 142/76   Pulse 60   Ht '5\' 5"'  (1.651 m)   Wt 151 lb 6.4 oz (68.7 kg)   SpO2 97%   BMI 25.19 kg/m    GEN: Well nourished, well developed, in no acute distress  HEENT: normal  Neck: no JVD, carotid bruits, or masses Cardiac: RRR; no murmurs, rubs, or gallops,no edema  Respiratory:  clear to auscultation bilaterally, normal work of breathing GI: soft, nontender, nondistended, + BS MS: no deformity or atrophy  Skin: warm and dry, no rash Neuro:  Alert and Oriented x 3, Strength and sensation are intact Psych: euthymic mood, full affect  Wt  Readings from Last 3 Encounters:  12/10/18 151 lb 6.4 oz (68.7 kg)  11/30/18 146 lb (66.2 kg)  10/09/18 145 lb (65.8 kg)      Studies/Labs Reviewed:   EKG:  EKG is ordered not today.   Recent Labs: 10/09/2018: ALT 19; BUN 17; Creatinine, Ser 1.05; Hemoglobin 12.7; Platelets 266; Potassium 4.4; Sodium 138   Lipid Panel    Component Value Date/Time   CHOL 133 09/05/2018 0815   TRIG 269 (H) 09/05/2018 0815   HDL 32 (L) 09/05/2018 0815   CHOLHDL 4.2 09/05/2018 0815   CHOLHDL 4.8 09/12/2016 0806   VLDL 29 09/12/2016 0806   LDLCALC 47 09/05/2018 0815    Additional studies/ records that were reviewed today include:   Echocardiogram: 09/2018 1. The left ventricle has normal systolic function with an ejection fraction of 60-65%. The cavity size was normal. Left ventricular diastolic parameters were normal. No evidence of left ventricular regional wall motion abnormalities.  2. The right ventricle has normal systolic function. The cavity was normal. There is no increase in right ventricular wall thickness. Right ventricular systolic pressure is normal with an estimated pressure of 24.9 mmHg.   Left Heart Cath and Coronary Angiography 08/2016  Conclusion  1. Severe proximal LAD stenosis  2. Severe proximal LCx stenosis 3. Mild-moderate RCA stenosis 4. Preserved LV systolic function with normal LVEF (>65%) and normal LVEDP  Pt essentially has left main equivalent with severe proximal/ostial LAD and LCx stenoses. Also has diffusely disease circumflex, OM, and diagonal branches. Recommend TCTS consult for CABG in setting of diabetes and severe multivessel CAD. Resume heparin post-cath.    OPERATIVE REPORT  09/2016  SURGICAL PROCEDURES:  Coronary artery bypass grafting x3 with left internal mammary to the left anterior descending coronary artery, reverse saphenous vein graft to the diagonal coronary artery, reverse saphenous vein graft to the second obtuse marginal with right thigh and  calf greater saphenous vein harvesting endoscopically.   ASSESSMENT & PLAN:    1. CAD status post CABG in 2018 No recurrent angina.  Continue aspirin,  Lopressor, Lipitor and losartan. Given RX of SL nitro and instructed  to how to use.   2. HTN -Blood pressure stable on current medication.  Continue amlodipine, losartan and metoprolol.  3.  Hyperlipidemia -Continue statin.  4.  History of recurrent syncope -Felt to be vasovagal.  Echocardiogram recently showed normal LV function without wall motion abnormality.  Did not got monitor.  No recurrent episode recently.  5.  Diabetes mellitus -Per primary care provider.    Medication Adjustments/Labs and Tests Ordered: Current medicines are reviewed at length with the patient today.  Concerns regarding medicines are outlined above.  Medication changes, Labs and Tests ordered today are listed in the Patient Instructions below. Patient Instructions  Medication Instructions:   Your physician recommends that you continue on your current medications as directed. Please refer to the Current Medication list given to you today.  If you need a refill on your cardiac medications before your next appointment, please call your pharmacy.   Lab work:  None ordered today  If you have labs (blood work) drawn today and your tests are completely normal, you will receive your results only by: Marland Kitchen MyChart Message (if you have MyChart) OR . A paper copy in the mail If you have any lab test that is abnormal or we need to change your treatment, we will call you to review the results.  Testing/Procedures:  None ordered today  Follow-Up: At [facility], you and your health needs are our priority.  As part of our continuing mission to provide you with exceptional heart care, we have created designated Provider Care Teams.  These Care Teams include your primary Cardiologist (physician) and Advanced Practice Providers (APPs -  Physician Assistants and  Nurse Practitioners) who all work together to provide you with the care you need, when you need it. You will need a follow up appointment in:  12 months.  Please call our office 2 months in advance to schedule this appointment.  You may see Sherren Mocha, MD or one of the following Advanced Practice Providers on your designated Care Team: Richardson Dopp, PA-C [place], [place] . Daune Perch, NP       Signed, Leanor Kail, [place]  12/10/2018 9:31 AM    [facility] [address], [place], [place]  [address] Phone: [phone]; Fax: [phone]

## 2018-12-10 ENCOUNTER — Other Ambulatory Visit: Payer: Self-pay

## 2018-12-10 ENCOUNTER — Encounter: Payer: Self-pay | Admitting: Physician Assistant

## 2018-12-10 ENCOUNTER — Ambulatory Visit (INDEPENDENT_AMBULATORY_CARE_PROVIDER_SITE_OTHER): Payer: Medicare Other | Admitting: Physician Assistant

## 2018-12-10 VITALS — BP 142/76 | HR 60 | Ht 65.0 in | Wt 151.4 lb

## 2018-12-10 DIAGNOSIS — Z951 Presence of aortocoronary bypass graft: Secondary | ICD-10-CM

## 2018-12-10 DIAGNOSIS — I2510 Atherosclerotic heart disease of native coronary artery without angina pectoris: Secondary | ICD-10-CM

## 2018-12-10 DIAGNOSIS — R55 Syncope and collapse: Secondary | ICD-10-CM

## 2018-12-10 DIAGNOSIS — I10 Essential (primary) hypertension: Secondary | ICD-10-CM | POA: Diagnosis not present

## 2018-12-10 DIAGNOSIS — E782 Mixed hyperlipidemia: Secondary | ICD-10-CM | POA: Diagnosis not present

## 2018-12-10 MED ORDER — NITROGLYCERIN 0.4 MG SL SUBL: 0.4000 mg | SUBLINGUAL_TABLET | SUBLINGUAL | 1 refills | Status: DC | PRN

## 2018-12-10 NOTE — Patient Instructions (Signed)
Medication Instructions:   Your physician recommends that you continue on your current medications as directed. Please refer to the Current Medication list given to you today.  If you need a refill on your cardiac medications before your next appointment, please call your pharmacy.   Lab work:  None ordered today  If you have labs (blood work) drawn today and your tests are completely normal, you will receive your results only by: . MyChart Message (if you have MyChart) OR . A paper copy in the mail If you have any lab test that is abnormal or we need to change your treatment, we will call you to review the results.  Testing/Procedures:  None ordered today  Follow-Up: At [facility], you and your health needs are our priority.  As part of our continuing mission to provide you with exceptional heart care, we have created designated Provider Care Teams.  These Care Teams include your primary Cardiologist (physician) and Advanced Practice Providers (APPs -  Physician Assistants and Nurse Practitioners) who all work together to provide you with the care you need, when you need it. You will need a follow up appointment in:  12 months.  Please call our office 2 months in advance to schedule this appointment.  You may see Michael Cooper, MD or one of the following Advanced Practice Providers on your designated Care Team: Scott Weaver, PA-C Vin Bhagat, PA-C . Janine Hammond, NP     

## 2019-01-08 ENCOUNTER — Ambulatory Visit: Payer: Medicare Other | Admitting: Nurse Practitioner

## 2019-05-18 IMAGING — CT CT ABD-PELV W/ CM
2 of 5 series · 16 of 46 positions shown, 18 images · IV contrast (APPLIED)
Comparison: Lumbar spine radiographs from 06/20/2014

CLINICAL DATA: Bilateral flank pain for 2 weeks.

EXAM:
CT ABDOMEN AND PELVIS WITH CONTRAST
TECHNIQUE: Multidetector CT imaging of the abdomen and pelvis was performed
using the standard protocol following bolus administration of
intravenous contrast.
CONTRAST:  100mL [id number] IOPAMIDOL ([id number]) INJECTION 61%

[Series 3: abd/ pelvis 5.0 i30f 2 · axial · 0.70mm/px · z∈[+692,+1087]mm · 13 of 89 slices shown, 15 images]
[im 5/89  soft-tissue]
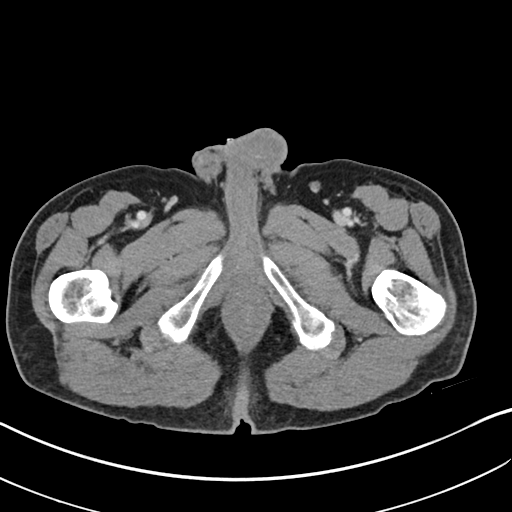
[im 5/89  bone]
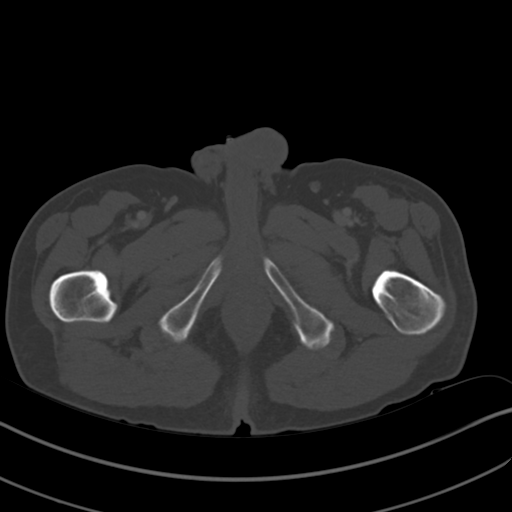
[im 14/89  soft-tissue]
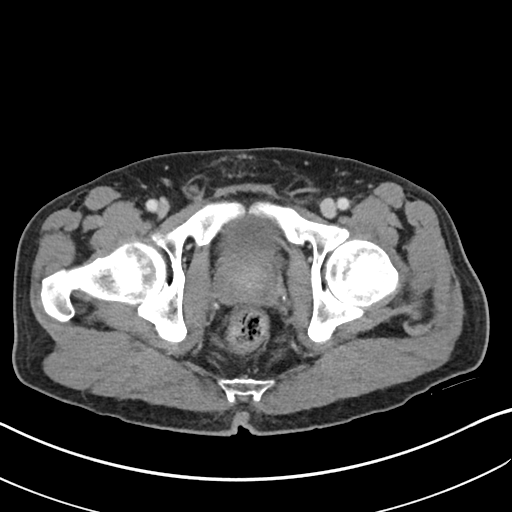
[im 19/89  soft-tissue]
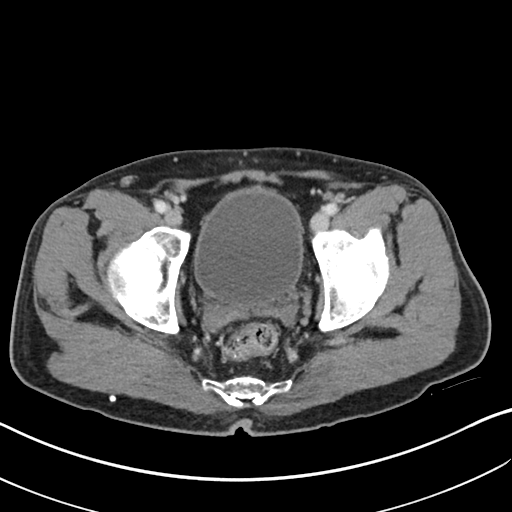
[im 24/89  soft-tissue]
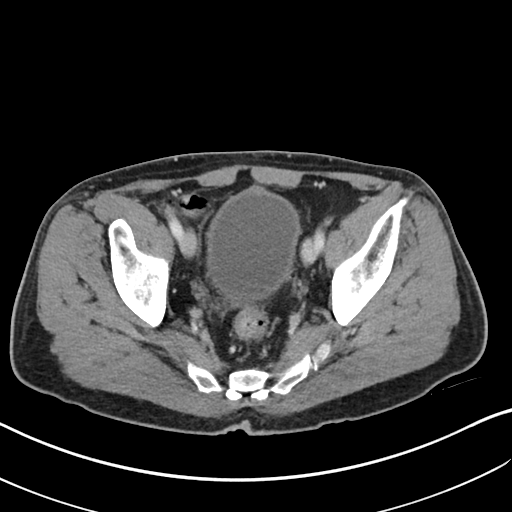
[im 33/89  soft-tissue]
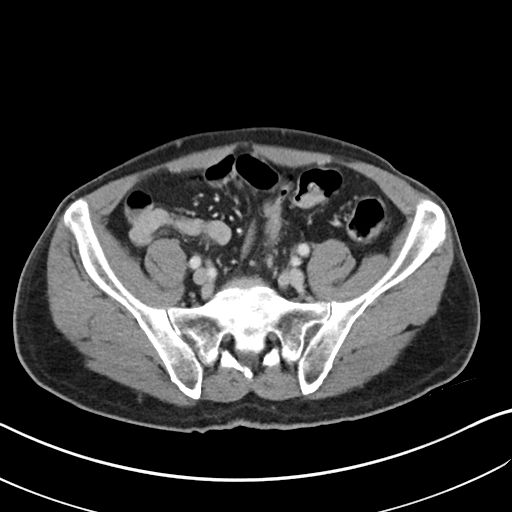
[im 38/89  soft-tissue]
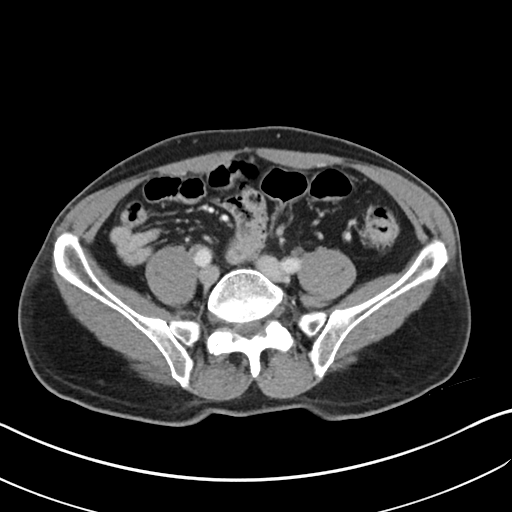
[im 47/89  soft-tissue]
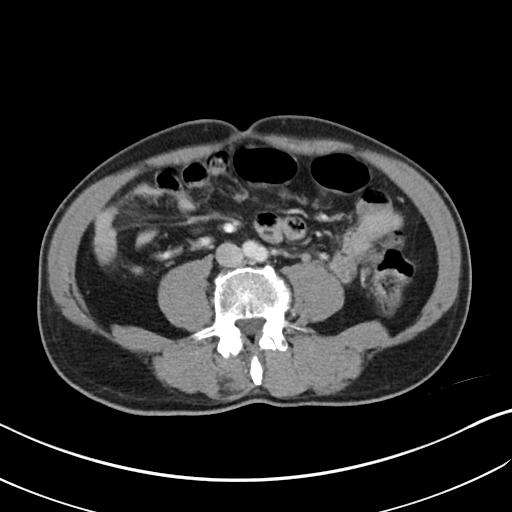
[im 51/89  soft-tissue]
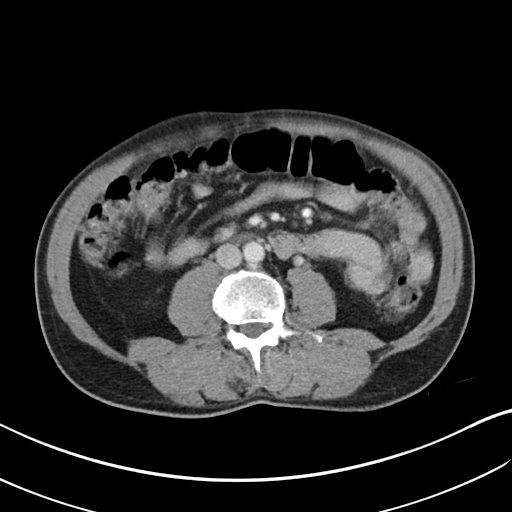
[im 56/89  soft-tissue]
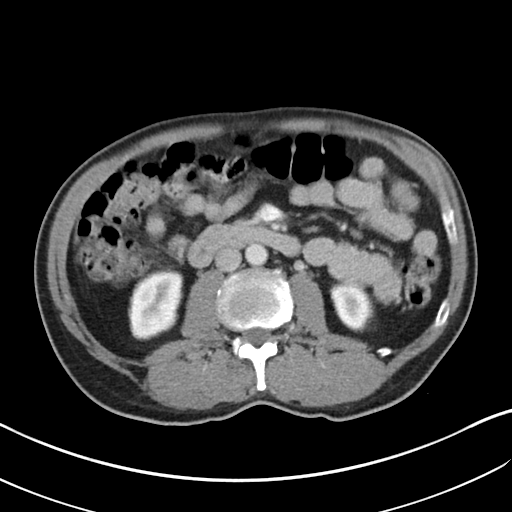
[im 56/89  bone]
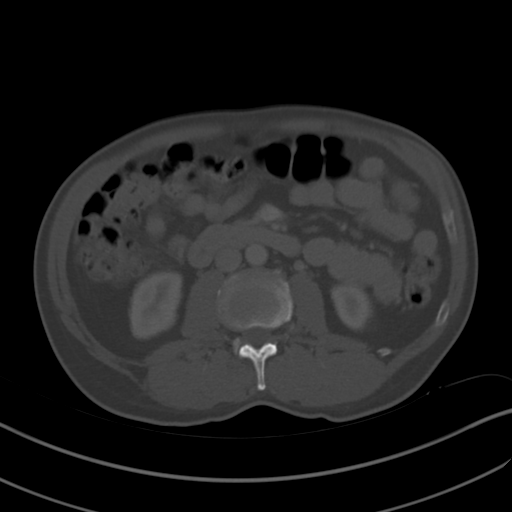
[im 65/89  soft-tissue]
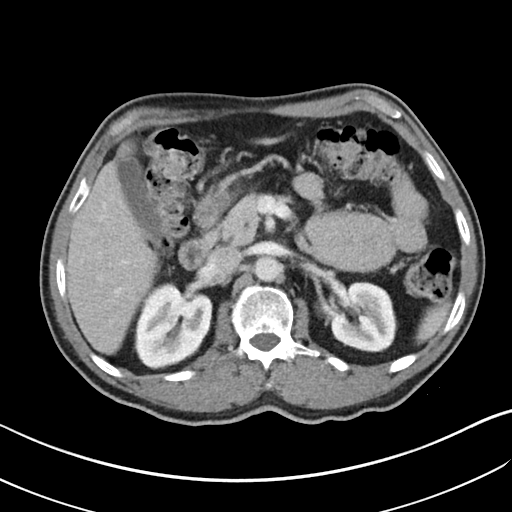
[im 70/89  soft-tissue]
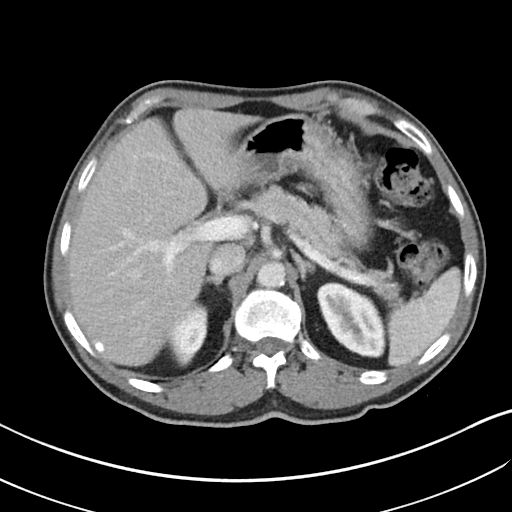
[im 75/89  soft-tissue]
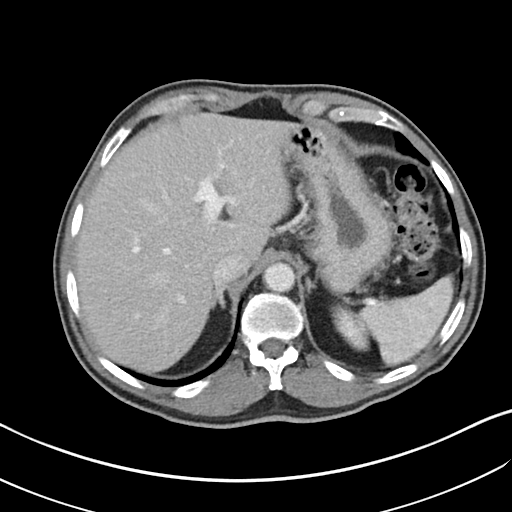
[im 84/89  soft-tissue]
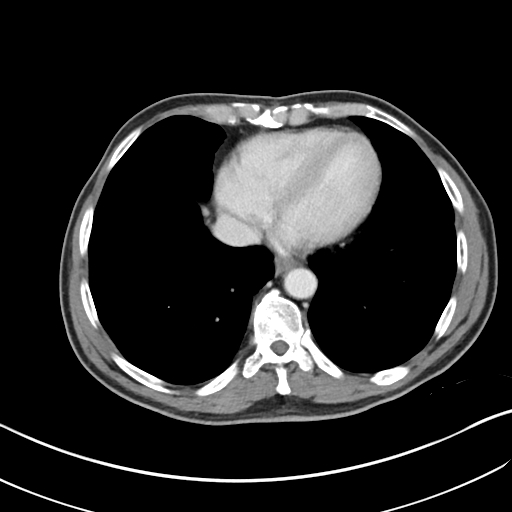

[Series 6: coronal soft tissue · coronal · 0.65mm/px · 3 of 86 slices shown]
[im 29/86  soft-tissue]
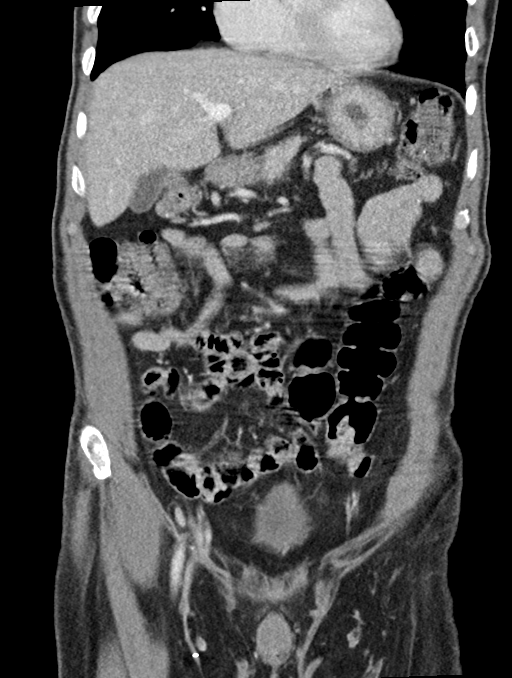
[im 38/86  soft-tissue]
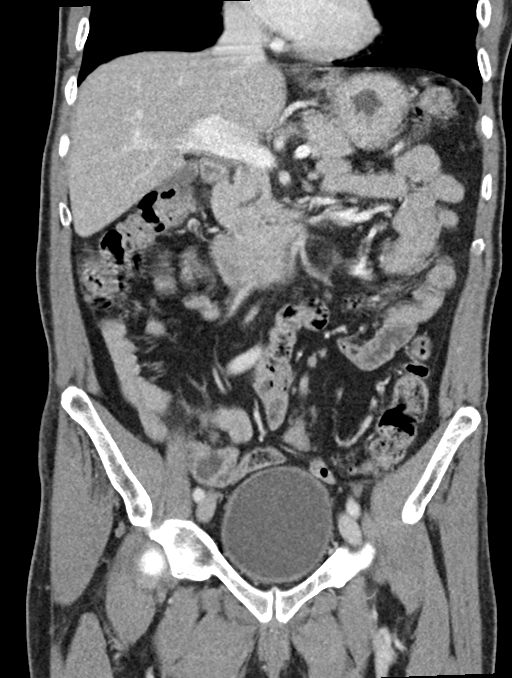
[im 48/86  soft-tissue]
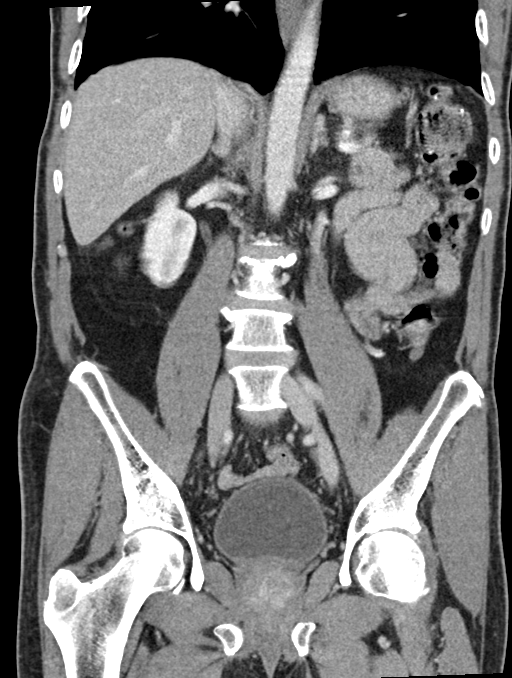

[16 of 46 positions shown; findings below may reference images not displayed]

FINDINGS: Lower chest: Partially included on the top most image there is an
indistinctly marginated right middle lobe nodule with included
portion measuring 5 mm in diameter.

Left anterior descending coronary artery atherosclerotic
calcification.

Hepatobiliary: Mildly contracted gallbladder. 1.3 by 0.4 cm
calcification along the posterior right hepatic lobe subcapsular
margin on image [DATE]. Otherwise unremarkable.

Pancreas: Unremarkable

Spleen: Unremarkable

Adrenals/Urinary Tract: Unremarkable

Stomach/Bowel: Unremarkable.  Appendix normal.

Vascular/Lymphatic: Aortoiliac atherosclerotic vascular disease.

Reproductive: Unremarkable

Other: No supplemental non-categorized findings.

Musculoskeletal: Prior median sternotomy.
IMPRESSION: 1. No specific cause for the patient's bilateral flank pain is
identified.
2. Peripheral calcification posteriorly in the right hepatic lobe
technically nonspecific but quite likely postinflammatory or due to
old granulomatous disease.
3. We partially include a 5 mm right middle lobe nodule on the top
most image. No follow-up needed if patient is low-risk. Non-contrast
chest CT can be considered in 12 months if patient is high-risk.
This recommendation follows the consensus statement: Guidelines for
Management of Incidental Pulmonary Nodules Detected on CT Images:
4. Aortic Atherosclerosis ([id number]-[id number].K). Left anterior descending
coronary artery atherosclerosis. Prior CABG.

## 2019-10-10 ENCOUNTER — Other Ambulatory Visit: Payer: Self-pay

## 2019-10-10 ENCOUNTER — Ambulatory Visit: Payer: Medicare Other | Attending: Physician Assistant | Admitting: Physician Assistant

## 2019-10-10 ENCOUNTER — Encounter: Payer: Self-pay | Admitting: Physician Assistant

## 2019-10-10 VITALS — BP 147/69 | Ht 65.0 in | Wt 143.4 lb

## 2019-10-10 DIAGNOSIS — I10 Essential (primary) hypertension: Secondary | ICD-10-CM | POA: Diagnosis not present

## 2019-10-10 DIAGNOSIS — Z79899 Other long term (current) drug therapy: Secondary | ICD-10-CM | POA: Insufficient documentation

## 2019-10-10 DIAGNOSIS — N529 Male erectile dysfunction, unspecified: Secondary | ICD-10-CM | POA: Insufficient documentation

## 2019-10-10 DIAGNOSIS — Z789 Other specified health status: Secondary | ICD-10-CM | POA: Diagnosis not present

## 2019-10-10 DIAGNOSIS — E118 Type 2 diabetes mellitus with unspecified complications: Secondary | ICD-10-CM | POA: Insufficient documentation

## 2019-10-10 DIAGNOSIS — Z951 Presence of aortocoronary bypass graft: Secondary | ICD-10-CM | POA: Insufficient documentation

## 2019-10-10 DIAGNOSIS — E782 Mixed hyperlipidemia: Secondary | ICD-10-CM | POA: Insufficient documentation

## 2019-10-10 DIAGNOSIS — Z794 Long term (current) use of insulin: Secondary | ICD-10-CM | POA: Diagnosis not present

## 2019-10-10 DIAGNOSIS — I252 Old myocardial infarction: Secondary | ICD-10-CM | POA: Insufficient documentation

## 2019-10-10 DIAGNOSIS — I2510 Atherosclerotic heart disease of native coronary artery without angina pectoris: Secondary | ICD-10-CM | POA: Insufficient documentation

## 2019-10-10 DIAGNOSIS — Z791 Long term (current) use of non-steroidal anti-inflammatories (NSAID): Secondary | ICD-10-CM | POA: Insufficient documentation

## 2019-10-10 DIAGNOSIS — IMO0002 Reserved for concepts with insufficient information to code with codable children: Secondary | ICD-10-CM

## 2019-10-10 DIAGNOSIS — E1165 Type 2 diabetes mellitus with hyperglycemia: Secondary | ICD-10-CM | POA: Diagnosis not present

## 2019-10-10 LAB — POCT GLYCOSYLATED HEMOGLOBIN (HGB A1C): HbA1c, POC (controlled diabetic range): 12 % — AB (ref 0.0–7.0)

## 2019-10-10 LAB — GLUCOSE, POCT (MANUAL RESULT ENTRY): POC Glucose: 308 mg/dl — AB (ref 70–99)

## 2019-10-10 MED ORDER — METFORMIN HCL 1000 MG PO TABS: 1000.0000 mg | ORAL_TABLET | Freq: Two times a day (BID) | ORAL | 1 refills | Status: DC

## 2019-10-10 MED ORDER — AMLODIPINE BESYLATE 5 MG PO TABS: 5.0000 mg | ORAL_TABLET | Freq: Every day | ORAL | 0 refills | Status: DC

## 2019-10-10 MED ORDER — LEVEMIR FLEXTOUCH 100 UNIT/ML ~~LOC~~ SOPN: 30.0000 [IU] | PEN_INJECTOR | Freq: Two times a day (BID) | SUBCUTANEOUS | 1 refills | Status: DC

## 2019-10-10 MED ORDER — LOSARTAN POTASSIUM 100 MG PO TABS: 100.0000 mg | ORAL_TABLET | Freq: Every day | ORAL | 0 refills | Status: DC

## 2019-10-10 MED ORDER — ATORVASTATIN CALCIUM 80 MG PO TABS: ORAL_TABLET | ORAL | 3 refills | Status: DC

## 2019-10-10 MED ORDER — METOPROLOL TARTRATE 25 MG PO TABS: 12.5000 mg | ORAL_TABLET | Freq: Two times a day (BID) | ORAL | 1 refills | Status: DC

## 2019-10-10 NOTE — Progress Notes (Signed)
Carl Clark, is a 69 y.o. male  YTR:[id number]  [id number]:[id number]  DOB - 1950/09/19  Subjective:  Chief Complaint and HPI: Carl Clark is a 69 y.o. male here today for med RF and erectile dysfunction.  [place] and out of meds for ~6 weeks so has not been taking meds or checkin sugar.  Denies any other concerns/complaints.  "[place]" with [employer] interpreters.    ROS:   Constitutional:  No f/c, No night sweats, No unexplained weight loss. EENT:  No vision changes, No blurry vision, No hearing changes. No mouth, throat, or ear problems.  Respiratory: No cough, No SOB Cardiac: No CP, no palpitations GI:  No abd pain, No N/V/D. GU: No Urinary s/sx Musculoskeletal: No joint pain Neuro: No headache, no dizziness, no motor weakness.  Skin: No rash Endocrine:  + polydipsia. + polyuria.  Psych: Denies SI/HI  No problems updated.  ALLERGIES: Allergies  Allergen Reactions  . Victoza [Liraglutide] Swelling    Lip swelling, itching, rash    PAST MEDICAL HISTORY: Past Medical History:  Diagnosis Date  . CAD (coronary artery disease) 03/16/2017   S/p NSTEMI 6/18 >> LHC: multivessel CAD >> s/p CABG // Echo 6/18: Mild LVH, EF 60-65, normal wall motion, grade 1 diastolic dysfunction, normal RV SF, PASP 36 (borderline pulmonary hypertension) // Pre-CABG Dopplers 09/13/16: Bilateral ICA 1-39  . Echocardiogram    Echo 09/2018: EF 60-65, normal wall motion, RVSP 24.9  . Family history of anesthesia complication    "never had anesthesia" (07/14/2012)  . History of non-ST elevation myocardial infarction (NSTEMI) 09/12/2016   Treated with CABG  . History of rectal bleeding    secondary to diverticulum affecting right hepatic flexure - 06/2012  . Hypertension   . Mild carotid artery disease (HCC)    a. 1-39% bilaterally preCABG in 2018.  . Type II diabetes mellitus ([place])     MEDICATIONS AT HOME: Prior to Admission medications   Medication Sig Start Date End Date Taking? Authorizing  Provider  Accu-Chek FastClix Lancets MISC Use as instructed to check blood sugar 3 times weekly before breakfast. E11.9. Patient requests 6 month supply 11/30/18  Yes Gildardo Pounds, NP  atorvastatin (LIPITOR) 80 MG tablet TAKE 1 TABLET BY MOUTH ONCE DAILY AT  6PM. Patient requests 6 month supply 10/10/19  Yes Mianna Iezzi M, PA-C  betamethasone dipropionate (DIPROLENE) 0.05 % cream Apply topically 2 (two) times daily. 07/24/18  Yes Gildardo Pounds, NP  Blood Glucose Monitoring Suppl (ACCU-CHEK GUIDE ME) w/Device KIT 1 kit by Does not apply route 3 (three) times daily. Use to check blood sugar 3 times daily. E11.9 10/09/18  Yes Gildardo Pounds, NP  diclofenac (VOLTAREN) 75 MG EC tablet Take 1 tablet (75 mg total) by mouth 2 (two) times daily. 10/09/18  Yes Gildardo Pounds, NP  fluocinonide cream (LIDEX) 7.57 % Apply 1 application topically 2 (two) times daily. 07/24/18  Yes Gildardo Pounds, NP  fluticasone (FLONASE) 50 MCG/ACT nasal spray Place 2 sprays into both nostrils daily. 04/02/18  Yes Gildardo Pounds, NP  glucose blood (ACCU-CHEK GUIDE) test strip Use as instructed to check blood sugar 3 times weekly before breakfast. E11.9. Patient requests 6 month supply 11/30/18  Yes Gildardo Pounds, NP  Insulin Pen Needle (TRUEPLUS PEN NEEDLES) 32G X 4 MM MISC Use as instructed. Inject into the skin twice daily. Patient requests 6 month supply 11/30/18  Yes Gildardo Pounds, NP  ketoconazole (NIZORAL) 2 % shampoo Apply 1  application topically 2 (two) times a week. 07/26/18  Yes Gildardo Pounds, NP  Misc. Devices MISC Please provide patient with an insurance approved QUAD CANE 08/10/17  Yes Gildardo Pounds, NP  nitroGLYCERIN (NITROSTAT) 0.4 MG SL tablet Place 1 tablet (0.4 mg total) under the tongue every 5 (five) minutes as needed for chest pain. 12/10/18  Yes Bhagat, Bhavinkumar, PA  amLODipine (NORVASC) 5 MG tablet Take 1 tablet (5 mg total) by mouth daily. Patient requests 6 month supply 10/10/19 04/07/20   Argentina Donovan, PA-C  insulin detemir (LEVEMIR FLEXTOUCH) 100 UNIT/ML FlexPen Inject 30 Units into the skin 2 (two) times daily. Patient requests 6 month supply 10/10/19 04/07/20  Argentina Donovan, PA-C  losartan (COZAAR) 100 MG tablet Take 1 tablet (100 mg total) by mouth daily. Patient requests 6 month supply 10/10/19 04/07/20  Argentina Donovan, PA-C  metFORMIN (GLUCOPHAGE) 1000 MG tablet Take 1 tablet (1,000 mg total) by mouth 2 (two) times daily with a meal. Patient requests 6 month supply 10/10/19 04/07/20  Argentina Donovan, PA-C  metoprolol tartrate (LOPRESSOR) 25 MG tablet Take 0.5 tablets (12.5 mg total) by mouth 2 (two) times daily. Patient requests 6 month supply 10/10/19 04/07/20  Argentina Donovan, PA-C     Objective:  EXAM:   Vitals:   10/10/19 0926  BP: (!) 147/69  Weight: 143 lb 6.4 oz (65 kg)  Height: '5\' 5"'  (1.651 m)    General appearance : A&OX3. NAD. Non-toxic-appearing HEENT: Atraumatic and Normocephalic.  PERRLA. EOM intact.  Neck: supple, no JVD. No cervical lymphadenopathy. No thyromegaly Chest/Lungs:  Breathing-non-labored, Good air entry bilaterally, breath sounds normal without rales, rhonchi, or wheezing  CVS: S1 S2 regular, no murmurs, gallops, rubs  Extremities: Bilateral Lower Ext shows no edema, both legs are warm to touch with = pulse throughout Neurology:  CN II-XII grossly intact, Non focal.   Psych:  TP linear. J/I WNL. Normal speech. Appropriate eye contact and affect.  Skin:  No Rash  Data Review Lab Results  Component Value Date   HGBA1C 12.0 (A) 10/10/2019   HGBA1C 9.7 (A) 10/09/2018   HGBA1C 11.0 (A) 07/09/2018     Assessment & Plan   1. Diabetes mellitus type 2, uncontrolled, with complications ([place]) Uncontrolled-was out of meds at least 6 weeks bc he was [place].  Resume regimen.  Check blood sugars tid and record and schedule appt with Lurena Joiner in 3 weeks - Glucose (CBG) - HgB A1c - atorvastatin (LIPITOR) 80 MG tablet; TAKE 1 TABLET BY  MOUTH ONCE DAILY AT  6PM. Patient requests 6 month supply  Dispense: 90 tablet; Refill: 3 - insulin detemir (LEVEMIR FLEXTOUCH) 100 UNIT/ML FlexPen; Inject 30 Units into the skin 2 (two) times daily. Patient requests 6 month supply  Dispense: 6 pen; Refill: 1 - metFORMIN (GLUCOPHAGE) 1000 MG tablet; Take 1 tablet (1,000 mg total) by mouth 2 (two) times daily with a meal. Patient requests 6 month supply  Dispense: 360 tablet; Refill: 1 - Comprehensive metabolic panel - CBC with Differential/Platelet  2. Essential hypertension - amLODipine (NORVASC) 5 MG tablet; Take 1 tablet (5 mg total) by mouth daily. Patient requests 6 month supply  Dispense: 180 tablet; Refill: 0 - losartan (COZAAR) 100 MG tablet; Take 1 tablet (100 mg total) by mouth daily. Patient requests 6 month supply  Dispense: 180 tablet; Refill: 0 - metoprolol tartrate (LOPRESSOR) 25 MG tablet; Take 0.5 tablets (12.5 mg total) by mouth 2 (two) times daily. Patient requests 6 month  supply  Dispense: 180 tablet; Refill: 1 - Comprehensive metabolic panel - CBC with Differential/Platelet  3. Mixed hyperlipidemia - atorvastatin (LIPITOR) 80 MG tablet; TAKE 1 TABLET BY MOUTH ONCE DAILY AT  6PM. Patient requests 6 month supply  Dispense: 90 tablet; Refill: 3 - Lipid panel  4. Erectile dysfunction, unspecified erectile dysfunction type Blood sugar control imperative to help with this issue - Ambulatory referral to Urology  5. Language barrier [employer] interpreters used and additional time performing visit was required.      Patient have been counseled extensively about nutrition and exercise  Return in about 3 months (around 01/10/2020) for PCP; chronic conditions.  The patient was given clear instructions to go to ER or return to medical center if symptoms don't improve, worsen or new problems develop. The patient verbalized understanding. The patient was told to call to get lab results if they haven't heard anything in the next week.       Freeman Caldron, PA-C [facility] [place], [place]   10/10/2019, 10:01 AMPatient ID: Carl Clark, male   DOB: [birth year], 69 y.o.   MRN: [id number]

## 2019-10-11 LAB — COMPREHENSIVE METABOLIC PANEL
ALT: 18 IU/L (ref 0–44)
AST: 15 IU/L (ref 0–40)
Albumin/Globulin Ratio: 0.9 — ABNORMAL LOW (ref 1.2–2.2)
Albumin: 3.8 g/dL (ref 3.8–4.8)
Alkaline Phosphatase: 91 IU/L (ref 48–121)
BUN/Creatinine Ratio: 16 (ref 10–24)
BUN: 17 mg/dL (ref 8–27)
Bilirubin Total: 0.5 mg/dL (ref 0.0–1.2)
CO2: 21 mmol/L (ref 20–29)
Calcium: 9.1 mg/dL (ref 8.6–10.2)
Chloride: 99 mmol/L (ref 96–106)
Creatinine, Ser: 1.05 mg/dL (ref 0.76–1.27)
GFR calc Af Amer: 84 mL/min/{1.73_m2} (ref >59)
GFR calc non Af Amer: 73 mL/min/{1.73_m2} (ref >59)
Globulin, Total: 4.2 g/dL (ref 1.5–4.5)
Glucose: 307 mg/dL — ABNORMAL HIGH (ref 65–99)
Potassium: 4.8 mmol/L (ref 3.5–5.2)
Sodium: 132 mmol/L — ABNORMAL LOW (ref 134–144)
Total Protein: 8 g/dL (ref 6.0–8.5)

## 2019-10-11 LAB — CBC WITH DIFFERENTIAL/PLATELET
Basophils Absolute: 0.1 10*3/uL (ref 0.0–0.2)
Basos: 2 %
EOS (ABSOLUTE): 0.6 10*3/uL — ABNORMAL HIGH (ref 0.0–0.4)
Eos: 15 %
Hematocrit: 38.2 % (ref 37.5–51.0)
Hemoglobin: 12.9 g/dL — ABNORMAL LOW (ref 13.0–17.7)
Immature Grans (Abs): 0 10*3/uL (ref 0.0–0.1)
Immature Granulocytes: 0 %
Lymphocytes Absolute: 1.6 10*3/uL (ref 0.7–3.1)
Lymphs: 36 %
MCH: 31.9 pg (ref 26.6–33.0)
MCHC: 33.8 g/dL (ref 31.5–35.7)
MCV: 95 fL (ref 79–97)
Monocytes Absolute: 0.4 10*3/uL (ref 0.1–0.9)
Monocytes: 10 %
Neutrophils Absolute: 1.6 10*3/uL (ref 1.4–7.0)
Neutrophils: 37 %
Platelets: 287 10*3/uL (ref 150–450)
RBC: 4.04 x10E6/uL — ABNORMAL LOW (ref 4.14–5.80)
RDW: 11.7 % (ref 11.6–15.4)
WBC: 4.4 10*3/uL (ref 3.4–10.8)

## 2019-10-11 LAB — LIPID PANEL
Chol/HDL Ratio: 3.6 ratio (ref 0.0–5.0)
Cholesterol, Total: 108 mg/dL (ref 100–199)
HDL: 30 mg/dL — ABNORMAL LOW (ref >39)
LDL Chol Calc (NIH): 53 mg/dL (ref 0–99)
Triglycerides: 146 mg/dL (ref 0–149)
VLDL Cholesterol Cal: 25 mg/dL (ref 5–40)

## 2019-10-29 MED FILL — METOPROLOL TARTRATE 25 MG T: 25 | 30 days supply | Qty: 30 | Fill #0

## 2019-10-29 MED FILL — LOSARTAN POTASSIUM 100 MG T: 100 | 30 days supply | Qty: 30 | Fill #0

## 2019-10-29 MED FILL — AMLODIPINE BESYLATE 5 MG TA: 5 | 30 days supply | Qty: 30 | Fill #0

## 2019-10-29 MED FILL — metFORMIN HCL 1000 MG TABS: 1000 | 30 days supply | Qty: 60 | Fill #0

## 2019-10-29 MED FILL — ATORVASTATIN 80 MG TABLET: 80 | 30 days supply | Qty: 30 | Fill #0

## 2019-11-11 ENCOUNTER — Encounter: Payer: Self-pay | Admitting: Physician Assistant

## 2019-11-22 ENCOUNTER — Ambulatory Visit: Payer: Medicare Other | Admitting: Nurse Practitioner

## 2019-11-22 MED FILL — METOPROLOL TARTRATE 25 MG T: 25 | 30 days supply | Qty: 30 | Fill #0

## 2019-11-22 MED FILL — LOSARTAN POTASSIUM 100 MG T: 100 | 30 days supply | Qty: 30 | Fill #0

## 2019-11-22 MED FILL — metFORMIN HCL 1000 MG TABS: 1000 | 30 days supply | Qty: 60 | Fill #0

## 2019-11-22 MED FILL — ATORVASTATIN 80 MG TABLET: 80 | 30 days supply | Qty: 30 | Fill #0

## 2019-11-22 MED FILL — AMLODIPINE BESYLATE 5 MG TA: 5 | 30 days supply | Qty: 30 | Fill #0

## 2019-11-22 MED FILL — LEVEMIR FLEXTOUCH 100 UNITS: 100 | 30 days supply | Qty: 18 | Fill #0

## 2019-11-28 DIAGNOSIS — M1712 Unilateral primary osteoarthritis, left knee: Secondary | ICD-10-CM | POA: Insufficient documentation

## 2019-11-28 DIAGNOSIS — M1711 Unilateral primary osteoarthritis, right knee: Secondary | ICD-10-CM | POA: Insufficient documentation

## 2019-12-02 DIAGNOSIS — M1711 Unilateral primary osteoarthritis, right knee: Secondary | ICD-10-CM | POA: Diagnosis not present

## 2019-12-02 DIAGNOSIS — M170 Bilateral primary osteoarthritis of knee: Secondary | ICD-10-CM | POA: Diagnosis not present

## 2019-12-02 DIAGNOSIS — M1712 Unilateral primary osteoarthritis, left knee: Secondary | ICD-10-CM | POA: Diagnosis not present

## 2019-12-16 DIAGNOSIS — M545 Low back pain: Secondary | ICD-10-CM | POA: Diagnosis not present

## 2019-12-16 DIAGNOSIS — M1711 Unilateral primary osteoarthritis, right knee: Secondary | ICD-10-CM | POA: Diagnosis not present

## 2020-01-01 ENCOUNTER — Ambulatory Visit: Payer: Medicare Other | Admitting: Physician Assistant

## 2020-01-06 ENCOUNTER — Encounter: Payer: Self-pay | Admitting: Nurse Practitioner

## 2020-01-06 ENCOUNTER — Ambulatory Visit: Payer: Medicare Other | Attending: Nurse Practitioner | Admitting: Nurse Practitioner

## 2020-01-06 ENCOUNTER — Other Ambulatory Visit: Payer: Self-pay | Admitting: Nurse Practitioner

## 2020-01-06 ENCOUNTER — Other Ambulatory Visit: Payer: Self-pay

## 2020-01-06 VITALS — BP 161/92 | HR 67 | Temp 97.7°F | Ht 65.0 in | Wt 135.0 lb

## 2020-01-06 DIAGNOSIS — I272 Pulmonary hypertension, unspecified: Secondary | ICD-10-CM | POA: Diagnosis not present

## 2020-01-06 DIAGNOSIS — Z951 Presence of aortocoronary bypass graft: Secondary | ICD-10-CM | POA: Diagnosis not present

## 2020-01-06 DIAGNOSIS — E1165 Type 2 diabetes mellitus with hyperglycemia: Secondary | ICD-10-CM

## 2020-01-06 DIAGNOSIS — K5909 Other constipation: Secondary | ICD-10-CM | POA: Diagnosis not present

## 2020-01-06 DIAGNOSIS — Z794 Long term (current) use of insulin: Secondary | ICD-10-CM | POA: Diagnosis not present

## 2020-01-06 DIAGNOSIS — I10 Essential (primary) hypertension: Secondary | ICD-10-CM

## 2020-01-06 DIAGNOSIS — E782 Mixed hyperlipidemia: Secondary | ICD-10-CM

## 2020-01-06 DIAGNOSIS — Z79899 Other long term (current) drug therapy: Secondary | ICD-10-CM | POA: Insufficient documentation

## 2020-01-06 DIAGNOSIS — Z1211 Encounter for screening for malignant neoplasm of colon: Secondary | ICD-10-CM | POA: Diagnosis not present

## 2020-01-06 DIAGNOSIS — Z7901 Long term (current) use of anticoagulants: Secondary | ICD-10-CM | POA: Insufficient documentation

## 2020-01-06 DIAGNOSIS — I2510 Atherosclerotic heart disease of native coronary artery without angina pectoris: Secondary | ICD-10-CM | POA: Diagnosis not present

## 2020-01-06 DIAGNOSIS — I252 Old myocardial infarction: Secondary | ICD-10-CM | POA: Diagnosis not present

## 2020-01-06 DIAGNOSIS — Z833 Family history of diabetes mellitus: Secondary | ICD-10-CM | POA: Insufficient documentation

## 2020-01-06 DIAGNOSIS — E118 Type 2 diabetes mellitus with unspecified complications: Secondary | ICD-10-CM

## 2020-01-06 DIAGNOSIS — IMO0002 Reserved for concepts with insufficient information to code with codable children: Secondary | ICD-10-CM

## 2020-01-06 LAB — POCT GLYCOSYLATED HEMOGLOBIN (HGB A1C): Hemoglobin A1C: 13.5 % — AB (ref 4.0–5.6)

## 2020-01-06 LAB — GLUCOSE, POCT (MANUAL RESULT ENTRY): POC Glucose: 265 mg/dl — AB (ref 70–99)

## 2020-01-06 MED ORDER — METFORMIN HCL 1000 MG PO TABS: 1000.0000 mg | ORAL_TABLET | Freq: Two times a day (BID) | ORAL | 1 refills | Status: DC

## 2020-01-06 MED ORDER — METOPROLOL TARTRATE 25 MG PO TABS: 25.0000 mg | ORAL_TABLET | Freq: Two times a day (BID) | ORAL | 1 refills | Status: DC

## 2020-01-06 MED ORDER — AMLODIPINE BESYLATE 5 MG PO TABS: 5.0000 mg | ORAL_TABLET | Freq: Every day | ORAL | 1 refills | Status: DC

## 2020-01-06 MED ORDER — CANAGLIFLOZIN 300 MG PO TABS: 300.0000 mg | ORAL_TABLET | Freq: Every day | ORAL | 1 refills | Status: AC

## 2020-01-06 MED ORDER — ACCU-CHEK GUIDE ME W/DEVICE KIT: 1.0000 | PACK | Freq: Three times a day (TID) | 0 refills | Status: DC

## 2020-01-06 MED ORDER — LOSARTAN POTASSIUM 100 MG PO TABS: 100.0000 mg | ORAL_TABLET | Freq: Every day | ORAL | 0 refills | Status: DC

## 2020-01-06 MED ORDER — SENNOSIDES-DOCUSATE SODIUM 8.6-50 MG PO TABS: 1.0000 | ORAL_TABLET | Freq: Two times a day (BID) | ORAL | 1 refills | Status: AC

## 2020-01-06 MED ORDER — LEVEMIR FLEXTOUCH 100 UNIT/ML ~~LOC~~ SOPN: 30.0000 [IU] | PEN_INJECTOR | Freq: Every day | SUBCUTANEOUS | 1 refills | Status: DC

## 2020-01-06 MED ORDER — ATORVASTATIN CALCIUM 80 MG PO TABS: ORAL_TABLET | ORAL | 3 refills | Status: DC

## 2020-01-06 MED FILL — METOPROLOL TARTRATE 25 MG T: 25 | 90 days supply | Qty: 180 | Fill #0

## 2020-01-06 MED FILL — INVOKANA 300 MG TABLET: 300 | 90 days supply | Qty: 90 | Fill #0

## 2020-01-06 MED FILL — LEVEMIR FLEXTOUCH 100 UNITS: 100 | 90 days supply | Qty: 27 | Fill #0

## 2020-01-06 MED FILL — METFORMIN HCL 1000 MG TABS: 1000 | 90 days supply | Qty: 180 | Fill #0

## 2020-01-06 MED FILL — ATORVASTATIN CALCIUM 80 MG: 80 | 90 days supply | Qty: 90 | Fill #0

## 2020-01-06 MED FILL — AMLODIPINE BESYLATE 5 MG TA: 5 | 90 days supply | Qty: 90 | Fill #0

## 2020-01-06 MED FILL — ONETOUCH VERIO REFLECT W/DE: W/DEVICE | 1 days supply | Qty: 1 | Fill #0

## 2020-01-06 NOTE — Progress Notes (Signed)
Assessment & Plan:  Chief was seen today for diabetes.  Diagnoses and all orders for this visit:  Diabetes mellitus type 2, uncontrolled, with complications (HCC) -     Glucose (CBG) -     HgB A1c -     Microalbumin/Creatinine Ratio, Urine -     insulin detemir (LEVEMIR FLEXTOUCH) 100 UNIT/ML FlexPen; Inject 30 Units into the skin at bedtime. Please fill as a 90 day supply -     canagliflozin (INVOKANA) 300 MG TABS tablet; Take 1 tablet (300 mg total) by mouth daily before breakfast. Please fill as a 90 day supply -     metFORMIN (GLUCOPHAGE) 1000 MG tablet; Take 1 tablet (1,000 mg total) by mouth 2 (two) times daily with a meal. Please fill as a 90 day supply -     atorvastatin (LIPITOR) 80 MG tablet; TAKE 1 TABLET BY MOUTH ONCE DAILY AT  6PM. Please fill as a 90 day supply -     CMP14+EGFR -     Ambulatory referral to Ophthalmology -     Blood Glucose Monitoring Suppl (ACCU-CHEK GUIDE ME) w/Device KIT; 1 kit by Does not apply route 3 (three) times daily. Use to check blood sugar 3 times daily. E11.9 Continue blood sugar control as discussed in office today, low carbohydrate diet, and regular physical exercise as tolerated, 150 minutes per week (30 min each day, 5 days per week, or 50 min 3 days per week). Keep blood sugar logs with fasting goal of 90-130 mg/dl, post prandial (after you eat) less than 180.  For Hypoglycemia: BS <60 and Hyperglycemia BS >400; contact the clinic ASAP. Annual eye exams and foot exams are recommended.   Essential hypertension -     metoprolol tartrate (LOPRESSOR) 25 MG tablet; Take 1 tablet (25 mg total) by mouth 2 (two) times daily. Please fill as a 90 day supply -     losartan (COZAAR) 100 MG tablet; Take 1 tablet (100 mg total) by mouth daily. Please fill as a 90 day supply -     CMP14+EGFR -     amLODipine (NORVASC) 5 MG tablet; Take 1 tablet (5 mg total) by mouth daily. Please fill as a 90 day supply Continue all antihypertensives as prescribed.   Remember to bring in your blood pressure log with you for your follow up appointment.  DASH/Mediterranean Diets are healthier choices for HTN.    Mixed hyperlipidemia -     atorvastatin (LIPITOR) 80 MG tablet; TAKE 1 TABLET BY MOUTH ONCE DAILY AT  6PM. Please fill as a 90 day supply INSTRUCTIONS: Work on a low fat, heart healthy diet and participate in regular aerobic exercise program by working out at least 150 minutes per week; 5 days a week-30 minutes per day. Avoid red meat/beef/steak,  fried foods. junk foods, sodas, sugary drinks, unhealthy snacking, alcohol and smoking.  Drink at least 80 oz of water per day and monitor your carbohydrate intake daily.    Colon cancer screening -     Ambulatory referral to Gastroenterology  Chronic constipation -     senna-docusate (SENOKOT-S) 8.6-50 MG tablet; Take 1-2 tablets by mouth 2 (two) times daily. He is aware that he can decrease the amount of tablets and frequency based on his stools.  Increase water intake to a minimum of 64 ounces a day  Patient has been counseled on age-appropriate routine health concerns for screening and prevention. These are reviewed and up-to-date. Referrals have been placed accordingly. Immunizations are  up-to-date or declined.    Subjective:   Chief Complaint  Patient presents with  . Diabetes    Pt. is here for diabetes follow up.    HPI Carl Clark 69 y.o. male presents to office today for follow up.   DM TYPE 2 He is not administering his insulin twice per day nor is he checking his blood glucose levels. He states he left his glucometer in [place] several months ago. He has not been taking his evening dose of Levemir due to his work schedule and because he believes it will drop his blood glucose levels too low. When I inquire what "too low" means to him he states he has not been checking his blood glucose readings but is just afraid it will drop too low.  A1c up from 12-13.5 today.  I have  added Invokana 300 mg daily today.  He will continue on Metformin 1000 mg twice daily and Levemir will be switched to 30 units daily.  He is to take this either in the morning or once at night depending on his work schedule however will no longer be twice a day at this time.  LDL is at goal with atorvastatin 80 mg daily. Lab Results  Component Value Date   HGBA1C 13.5 (A) 01/06/2020   Lab Results  Component Value Date   HGBA1C 12.0 (A) 10/10/2019   Lab Results  Component Value Date   LDLCALC 53 10/10/2019    Essential Hypertension Not well controlled. He states he ran out of his blood pressure medications several days ago.  Current medications include losartan 100 mg daily, metoprolol 12.5 mg twice daily which will be increased to 25 mg twice daily today and amlodipine 5 mg daily.  Denies chest pain, shortness of breath, palpitations, lightheadedness, dizziness, headaches or BLE edema.  BP Readings from Last 3 Encounters:  01/06/20 (!) 161/92  10/10/19 (!) 147/69  12/10/18 (!) 142/76   Chronic constipation Endorses infrequent bowel movements with only a few occurring several times a week.  He is not taking any medication over-the-counter for this. Likely related to diuretic/thiazide use.   Review of Systems  Constitutional: Negative for fever, malaise/fatigue and weight loss.  HENT: Negative.  Negative for nosebleeds.   Eyes: Negative.  Negative for blurred vision, double vision and photophobia.  Respiratory: Negative.  Negative for cough and shortness of breath.   Cardiovascular: Negative.  Negative for chest pain, palpitations and leg swelling.  Gastrointestinal: Positive for constipation. Negative for abdominal pain, blood in stool, diarrhea, heartburn, melena, nausea and vomiting.  Musculoskeletal: Negative.  Negative for myalgias.  Neurological: Negative.  Negative for dizziness, focal weakness, seizures and headaches.  Psychiatric/Behavioral: Negative.  Negative for suicidal  ideas.    Past Medical History:  Diagnosis Date  . CAD (coronary artery disease) 03/16/2017   S/p NSTEMI 6/18 >> LHC: multivessel CAD >> s/p CABG // Echo 6/18: Mild LVH, EF 60-65, normal wall motion, grade 1 diastolic dysfunction, normal RV SF, PASP 36 (borderline pulmonary hypertension) // Pre-CABG Dopplers 09/13/16: Bilateral ICA 1-39  . Echocardiogram    Echo 09/2018: EF 60-65, normal wall motion, RVSP 24.9  . Family history of anesthesia complication    "never had anesthesia" (07/14/2012)  . History of non-ST elevation myocardial infarction (NSTEMI) 09/12/2016   Treated with CABG  . History of rectal bleeding    secondary to diverticulum affecting right hepatic flexure - 06/2012  . Hypertension   . Mild carotid artery disease ([place])  a. 1-39% bilaterally preCABG in 2018.  . Type II diabetes mellitus ([place])     Past Surgical History:  Procedure Laterality Date  . COLONOSCOPY Left 07/14/2012   Procedure: COLONOSCOPY;  Surgeon: Arta Silence, MD;  Location: [facility] ENDOSCOPY;  Service: Endoscopy;  Laterality: Left;  . CORONARY ARTERY BYPASS GRAFT N/A 09/15/2016   Procedure: CORONARY ARTERY BYPASS GRAFTING (CABG) x 3, LIMA to LAD, SVG to DIAGONAL, SVG to OM2, USING LEFT MAMMARY ARTERY AND RIGHT GREATER SAPHENOUS VEIN HARVESTED ENDOSCOPICALLY;  Surgeon: Grace Isaac, MD;  Location: [facility];  Service: Open Heart Surgery;  Laterality: N/A;  . ESOPHAGOGASTRODUODENOSCOPY N/A 07/13/2012   Procedure: ESOPHAGOGASTRODUODENOSCOPY (EGD);  Surgeon: Arta Silence, MD;  Location: [facility] ENDOSCOPY;  Service: Endoscopy;  Laterality: N/A;  pt in ED A8  . LEFT HEART CATH AND CORONARY ANGIOGRAPHY N/A 09/12/2016   Procedure: Left Heart Cath and Coronary Angiography;  Surgeon: Sherren Mocha, MD;  Location: [place] CV LAB;  Service: Cardiovascular;  Laterality: N/A;  . NO PAST SURGERIES    . TEE WITHOUT CARDIOVERSION N/A 09/15/2016   Procedure: TRANSESOPHAGEAL ECHOCARDIOGRAM (TEE);  Surgeon: Grace Isaac,  MD;  Location: [place];  Service: Open Heart Surgery;  Laterality: N/A;    Family History  Problem Relation Age of Onset  . Diabetes Mother   . Diabetes Father     Social History Reviewed with no changes to be made today.   Outpatient Medications Prior to Visit  Medication Sig Dispense Refill  . Accu-Chek FastClix Lancets MISC Use as instructed to check blood sugar 3 times weekly before breakfast. E11.9. Patient requests 6 month supply 100 each 6  . betamethasone dipropionate (DIPROLENE) 0.05 % cream Apply topically 2 (two) times daily. 90 g 0  . diclofenac (VOLTAREN) 75 MG EC tablet Take 1 tablet (75 mg total) by mouth 2 (two) times daily. 30 tablet 0  . fluocinonide cream (LIDEX) 3.41 % Apply 1 application topically 2 (two) times daily. 120 g 0  . fluticasone (FLONASE) 50 MCG/ACT nasal spray Place 2 sprays into both nostrils daily. 16 g 6  . glucose blood (ACCU-CHEK GUIDE) test strip Use as instructed to check blood sugar 3 times weekly before breakfast. E11.9. Patient requests 6 month supply 100 each 6  . Insulin Pen Needle (TRUEPLUS PEN NEEDLES) 32G X 4 MM MISC Use as instructed. Inject into the skin twice daily. Patient requests 6 month supply 200 each 11  . ketoconazole (NIZORAL) 2 % shampoo Apply 1 application topically 2 (two) times a week. 120 mL 2  . amLODipine (NORVASC) 5 MG tablet Take 1 tablet (5 mg total) by mouth daily. Patient requests 6 month supply 180 tablet 0  . atorvastatin (LIPITOR) 80 MG tablet TAKE 1 TABLET BY MOUTH ONCE DAILY AT  6PM. Patient requests 6 month supply 90 tablet 3  . Blood Glucose Monitoring Suppl (ACCU-CHEK GUIDE ME) w/Device KIT 1 kit by Does not apply route 3 (three) times daily. Use to check blood sugar 3 times daily. E11.9 1 kit 0  . insulin detemir (LEVEMIR FLEXTOUCH) 100 UNIT/ML FlexPen Inject 30 Units into the skin 2 (two) times daily. Patient requests 6 month supply (Patient taking differently: Inject 20 Units into the skin 2 (two) times daily.  Patient requests 6 month supply) 6 pen 1  . losartan (COZAAR) 100 MG tablet Take 1 tablet (100 mg total) by mouth daily. Patient requests 6 month supply 180 tablet 0  . metFORMIN (GLUCOPHAGE) 1000 MG tablet Take 1 tablet (1,000  mg total) by mouth 2 (two) times daily with a meal. Patient requests 6 month supply 360 tablet 1  . Misc. Devices MISC Please provide patient with an insurance approved QUAD CANE 1 each 0  . nitroGLYCERIN (NITROSTAT) 0.4 MG SL tablet Place 1 tablet (0.4 mg total) under the tongue every 5 (five) minutes as needed for chest pain. 90 tablet 1  . metoprolol tartrate (LOPRESSOR) 25 MG tablet Take 0.5 tablets (12.5 mg total) by mouth 2 (two) times daily. Patient requests 6 month supply 180 tablet 1   No facility-administered medications prior to visit.    Allergies  Allergen Reactions  . Victoza [Liraglutide] Swelling    Lip swelling, itching, rash       Objective:    BP (!) 161/92 (BP Location: Left Arm, Patient Position: Sitting, Cuff Size: Normal)   Pulse 67   Temp 97.7 F (36.5 C) (Temporal)   Ht '5\' 5"'  (1.651 m)   Wt 135 lb (61.2 kg)   SpO2 100%   BMI 22.47 kg/m  Wt Readings from Last 3 Encounters:  01/06/20 135 lb (61.2 kg)  10/10/19 143 lb 6.4 oz (65 kg)  12/10/18 151 lb 6.4 oz (68.7 kg)    Physical Exam Vitals and nursing note reviewed.  Constitutional:      Appearance: He is well-developed.  HENT:     Head: Normocephalic and atraumatic.  Cardiovascular:     Rate and Rhythm: Normal rate and regular rhythm.     Heart sounds: Normal heart sounds. No murmur heard.  No friction rub. No gallop.   Pulmonary:     Effort: Pulmonary effort is normal. No tachypnea or respiratory distress.     Breath sounds: Normal breath sounds. No decreased breath sounds, wheezing, rhonchi or rales.  Chest:     Chest wall: No tenderness.  Abdominal:     General: Bowel sounds are normal.     Palpations: Abdomen is soft.  Musculoskeletal:        General: Normal range  of motion.     Cervical back: Normal range of motion.  Skin:    General: Skin is warm and dry.  Neurological:     Mental Status: He is alert and oriented to person, place, and time.     Coordination: Coordination normal.  Psychiatric:        Behavior: Behavior normal. Behavior is cooperative.        Thought Content: Thought content normal.        Judgment: Judgment normal.          Patient has been counseled extensively about nutrition and exercise as well as the importance of adherence with medications and regular follow-up. The patient was given clear instructions to go to ER or return to medical center if symptoms don't improve, worsen or new problems develop. The patient verbalized understanding.   Follow-up: Return for 4 weeks meter and BP check with luke. See me in 64mhs.   ZGildardo Pounds FNP-BC [facility]  01/06/2020, 4:48 PM

## 2020-01-07 LAB — CMP14+EGFR
ALT: 12 IU/L (ref 0–44)
AST: 11 IU/L (ref 0–40)
Albumin/Globulin Ratio: 0.9 — ABNORMAL LOW (ref 1.2–2.2)
Albumin: 4.1 g/dL (ref 3.8–4.8)
Alkaline Phosphatase: 115 IU/L (ref 44–121)
BUN/Creatinine Ratio: 17 (ref 10–24)
BUN: 19 mg/dL (ref 8–27)
Bilirubin Total: 0.3 mg/dL (ref 0.0–1.2)
CO2: 24 mmol/L (ref 20–29)
Calcium: 9.5 mg/dL (ref 8.6–10.2)
Chloride: 98 mmol/L (ref 96–106)
Creatinine, Ser: 1.09 mg/dL (ref 0.76–1.27)
GFR calc Af Amer: 80 mL/min/{1.73_m2} (ref >59)
GFR calc non Af Amer: 69 mL/min/{1.73_m2} (ref >59)
Globulin, Total: 4.5 g/dL (ref 1.5–4.5)
Glucose: 264 mg/dL — ABNORMAL HIGH (ref 65–99)
Potassium: 4.7 mmol/L (ref 3.5–5.2)
Sodium: 135 mmol/L (ref 134–144)
Total Protein: 8.6 g/dL — ABNORMAL HIGH (ref 6.0–8.5)

## 2020-01-07 LAB — MICROALBUMIN / CREATININE URINE RATIO
Creatinine, Urine: 218.3 mg/dL
Microalb/Creat Ratio: 40 mg/g creat — ABNORMAL HIGH (ref 0–29)
Microalbumin, Urine: 87.4 ug/mL

## 2020-01-09 ENCOUNTER — Other Ambulatory Visit: Payer: Self-pay | Admitting: Family Medicine

## 2020-01-09 ENCOUNTER — Other Ambulatory Visit: Payer: Self-pay | Admitting: Pharmacist

## 2020-01-09 MED ORDER — ONETOUCH DELICA LANCETS 33G MISC: 6 refills | Status: DC

## 2020-01-09 MED ORDER — ONETOUCH VERIO VI STRP: ORAL_STRIP | 6 refills | Status: DC

## 2020-01-09 MED FILL — ONE TOUCH VERIO TEST STRIP: 33 days supply | Qty: 100 | Fill #0

## 2020-01-09 MED FILL — ONETOUCH DELICA PLUS LANCET: 33 days supply | Qty: 100 | Fill #0

## 2020-01-10 ENCOUNTER — Encounter: Payer: Self-pay | Admitting: Physician Assistant

## 2020-01-21 NOTE — Progress Notes (Deleted)
{Choose 1 Note Type (Video or Telephone):[phone]}    Date:  01/21/2020   ID:  Carl Clark, DOB [birth year], MRN [id number] The patient was identified using 2 identifiers.  {Patient Location:[id number]::"Home"} {Provider Location:[id number]::"Home Office"}  PCP:  Claiborne Rigg, NP  Cardiologist:  Tonny Bollman, MD *** Electrophysiologist:  None   Evaluation Performed:  {Choose Visit [phone]::"Follow-Up Visit"}  Chief Complaint:  ***   Patient Profile: Carl Clark is a 69 y.o. male with:  Coronary artery disease  S/p CABG ? NSTEMI 08/2016 >> s/p CABG (LIMA-LAD, SVG-diagonal, SVG-OM) - Dr Tyrone Sage  Diabetes Mellitus  Hypertension  Hyperlipidemia   Prior CV Studies: *** Echocardiogram 09/26/2018 EF 60-65, no RWMA, normal RVSF, RVSP 24.9  Intraoperative TEE 09/15/16 Moderate concentric LVH, EF 60-65, normal wall motion, trace TR  Echocardiogram 09/14/16 Mild LVH, EF 60-65, normal wall motion, grade 1 diastolic dysfunction, normal RV SF, PASP 36 (borderline pulmonary hypertension)  Cardiac catheterization 09/12/16 Three-vessel CAD>>referred for CABG   Pre-CABG Dopplers 09/13/16 Bilateral ICA 1-39  History of Present Illness:   Mr. Mcgann was last seen by [name], PA-C in 11/2018.  He is seen for follow up.  ***  Past Medical History:  Diagnosis Date  . CAD (coronary artery disease) 03/16/2017   S/p NSTEMI 6/18 >> LHC: multivessel CAD >> s/p CABG // Echo 6/18: Mild LVH, EF 60-65, normal wall motion, grade 1 diastolic dysfunction, normal RV SF, PASP 36 (borderline pulmonary hypertension) // Pre-CABG Dopplers 09/13/16: Bilateral ICA 1-39  . Echocardiogram    Echo 09/2018: EF 60-65, normal wall motion, RVSP 24.9  . Family history of anesthesia complication    "never had anesthesia" (07/14/2012)  . History of non-ST elevation myocardial infarction (NSTEMI) 09/12/2016   Treated with CABG  . History of rectal bleeding     secondary to diverticulum affecting right hepatic flexure - 06/2012  . Hypertension   . Mild carotid artery disease (HCC)    a. 1-39% bilaterally preCABG in 2018.  . Type II diabetes mellitus (HCC)    Past Surgical History:  Procedure Laterality Date  . COLONOSCOPY Left 07/14/2012   Procedure: COLONOSCOPY;  Surgeon: Willis Modena, MD;  Location: [facility] ENDOSCOPY;  Service: Endoscopy;  Laterality: Left;  . CORONARY ARTERY BYPASS GRAFT N/A 09/15/2016   Procedure: CORONARY ARTERY BYPASS GRAFTING (CABG) x 3, LIMA to LAD, SVG to DIAGONAL, SVG to OM2, USING LEFT MAMMARY ARTERY AND RIGHT GREATER SAPHENOUS VEIN HARVESTED ENDOSCOPICALLY;  Surgeon: Delight Ovens, MD;  Location: [facility] OR;  Service: Open Heart Surgery;  Laterality: N/A;  . ESOPHAGOGASTRODUODENOSCOPY N/A 07/13/2012   Procedure: ESOPHAGOGASTRODUODENOSCOPY (EGD);  Surgeon: Willis Modena, MD;  Location: [facility] ENDOSCOPY;  Service: Endoscopy;  Laterality: N/A;  pt in ED A8  . LEFT HEART CATH AND CORONARY ANGIOGRAPHY N/A 09/12/2016   Procedure: Left Heart Cath and Coronary Angiography;  Surgeon: Tonny Bollman, MD;  Location: [facility] INVASIVE CV LAB;  Service: Cardiovascular;  Laterality: N/A;  . NO PAST SURGERIES    . TEE WITHOUT CARDIOVERSION N/A 09/15/2016   Procedure: TRANSESOPHAGEAL ECHOCARDIOGRAM (TEE);  Surgeon: Delight Ovens, MD;  Location: [facility] OR;  Service: Open Heart Surgery;  Laterality: N/A;     No outpatient medications have been marked as taking for the 01/22/20 encounter (Appointment) with [name] T, PA-C.     Allergies:   Victoza [liraglutide]   Social History   Tobacco Use  . Smoking status: Never Smoker  . Smokeless tobacco: Never Used  Vaping Use  . Vaping Use: Never  used  Substance Use Topics  . Alcohol use: No  . Drug use: No     Family Hx: The patient's family history includes Diabetes in his father and mother.  ROS:   Please see the history of present illness.    *** All other systems reviewed and are  negative.   Prior CV studies:   The following studies were reviewed today:  ***  Labs/Other Tests and Data Reviewed:    EKG:  {EKG/Telemetry Strips Reviewed:[phone]}  Recent Labs: 10/10/2019: Hemoglobin 12.9; Platelets 287 01/06/2020: ALT 12; BUN 19; Creatinine, Ser 1.09; Potassium 4.7; Sodium 135   Recent Lipid Panel Lab Results  Component Value Date/Time   CHOL 108 10/10/2019 10:07 AM   TRIG 146 10/10/2019 10:07 AM   HDL 30 (L) 10/10/2019 10:07 AM   CHOLHDL 3.6 10/10/2019 10:07 AM   CHOLHDL 4.8 09/12/2016 08:06 AM   LDLCALC 53 10/10/2019 10:07 AM    Wt Readings from Last 3 Encounters:  01/06/20 135 lb (61.2 kg)  10/10/19 143 lb 6.4 oz (65 kg)  12/10/18 151 lb 6.4 oz (68.7 kg)     Risk Assessment/Calculations:   {Does this patient have ATRIAL FIBRILLATION?:[phone]}  Objective:    Vital Signs:  There were no vitals taken for this visit.   {[facility] Virtual Exam (Optional):[phone]::"VITAL SIGNS:  reviewed"}  ASSESSMENT & PLAN:    1. ***  ***1. Coronary artery disease involving native coronary artery of native heart without angina pectoris Hx of MI followed by CABG in 2018.  He has not really had any anginal symptoms.  I suspect his recent syncopal episode was vasovagal.  I do not think he needs a stress test at this time. Continue ASA, statin, beta-blocker, angiotensin receptor blocker.    2. Syncope, unspecified syncope type As noted, this was probably vasovagal.  I will have him do an Echo to rule out new LV dysfunction.  I will also get a Holter monitor x 24 hours since he does have bradycardia on his ECG.  As long as these are unremarkable, he will not need further testing unless syncope recurs.   3. Essential hypertension The patient's blood pressure is controlled on his current regimen.  Continue current therapy.    4. Hyperlipidemia, unspecified hyperlipidemia type LDL optimal on most recent lab work.  Continue current Rx.    5. Back  pain I have asked him to follow up with his PCP for management of this. ***  COVID-19 Education: The signs and symptoms of COVID-19 were discussed with the patient and how to seek care for testing (follow up with PCP or arrange E-visit).  ***The importance of social distancing was discussed today.  Time:   Today, I have spent *** minutes with the patient with telehealth technology discussing the above problems.     Medication Adjustments/Labs and Tests Ordered: Current medicines are reviewed at length with the patient today.  Concerns regarding medicines are outlined above.   Tests Ordered: No orders of the defined types were placed in this encounter.   Medication Changes: No orders of the defined types were placed in this encounter.   Follow Up:  {F/U Format:[phone]} {follow up:15908}  Signed, [name], PA-C  01/21/2020 4:35 PM    [facility]

## 2020-01-22 ENCOUNTER — Telehealth: Payer: Medicare Other | Admitting: Physician Assistant

## 2020-01-22 DIAGNOSIS — E118 Type 2 diabetes mellitus with unspecified complications: Secondary | ICD-10-CM

## 2020-01-22 DIAGNOSIS — I10 Essential (primary) hypertension: Secondary | ICD-10-CM

## 2020-01-22 DIAGNOSIS — I2510 Atherosclerotic heart disease of native coronary artery without angina pectoris: Secondary | ICD-10-CM

## 2020-01-22 DIAGNOSIS — E782 Mixed hyperlipidemia: Secondary | ICD-10-CM

## 2020-02-06 ENCOUNTER — Ambulatory Visit: Payer: Medicare Other | Attending: Nurse Practitioner | Admitting: Pharmacist

## 2020-02-06 ENCOUNTER — Other Ambulatory Visit: Payer: Self-pay | Admitting: Family Medicine

## 2020-02-06 ENCOUNTER — Encounter: Payer: Self-pay | Admitting: Pharmacist

## 2020-02-06 ENCOUNTER — Other Ambulatory Visit: Payer: Self-pay

## 2020-02-06 DIAGNOSIS — E118 Type 2 diabetes mellitus with unspecified complications: Secondary | ICD-10-CM | POA: Diagnosis not present

## 2020-02-06 DIAGNOSIS — E1165 Type 2 diabetes mellitus with hyperglycemia: Secondary | ICD-10-CM

## 2020-02-06 DIAGNOSIS — IMO0002 Reserved for concepts with insufficient information to code with codable children: Secondary | ICD-10-CM

## 2020-02-06 MED ORDER — LEVEMIR FLEXTOUCH 100 UNIT/ML ~~LOC~~ SOPN: PEN_INJECTOR | SUBCUTANEOUS | 2 refills | Status: DC

## 2020-02-06 NOTE — Progress Notes (Signed)
    S:    PCP: [name]  No chief complaint on file.  Patient arrives in good spirits.  Presents for diabetes management at the request of [name]. Patient was referred on 01/06/2020. At that visit, Invokana was started. Levemir adjusted to 30 units daily.   Patient reports diabetes was diagnosed 20 years.   Family/Social History:  - FHx: DM (mother, father) - Tobacco: never smoker - Alcohol: reports occasional use   Insurance coverage/medication affordability:  - [employer] Medicare  Patient reports adherence with medications.  Current diabetes medications include:  - Invokana 300 mg daily - Levemir 30 units daily - Metformin 1000 mg BID  Patient denies hypoglycemic events.  Patient reported dietary habits:  - He does not limit carbs  - He reports drinking fruit juice  Patient-reported exercise habits:  - Plays soccer on the weekends - Main source of physical activity is through work    Patient reports polyuria. Denies polydipsia.  Patient denies neuropathy. Patient denies visual changes. Patient reports self foot exams.   O:  Home CBG ranges:  - Fasting: 230-250 reported  - Does not have his meter today   Lab Results  Component Value Date   HGBA1C 13.5 (A) 01/06/2020   There were no vitals filed for this visit.  Lipid Panel     Component Value Date/Time   CHOL 108 10/10/2019 1007   TRIG 146 10/10/2019 1007   HDL 30 (L) 10/10/2019 1007   CHOLHDL 3.6 10/10/2019 1007   CHOLHDL 4.8 09/12/2016 0806   VLDL 29 09/12/2016 0806   LDLCALC 53 10/10/2019 1007   Clinical ASCVD: Yes - CAD, NSTEMI/CABG The ASCVD Risk score Denman George DC Jr., et al., 2013) failed to calculate for the following reasons:   The patient has a prior MI or stroke diagnosis   A/P: Diabetes longstanding currently uncontrolled. A1c has been trending up secondary to medication non-compliance. Patient is able to verbalize appropriate hypoglycemia management plan. Patient reports adherence to medication  but I suspect continued non-compliance.   Given the kinetic parameters of Levemir, it is best injected BID (even as a basal insulin). Pt has refused this d/t fear of hypoglycemia. I recommended to change to Lantus or Evaristo Bury, but pt reports a reaction to these. I cannot find in the chart where he's tried these insulins before. He has tried Victoza before and experienced an allergic reaction to it. I believe he is confused. When I attempted to explain this, he reported never trying Victoza.   After a long discussion, he is amenable to injecting a small amount of Levemir before bedtime in an attempt to improve glycemic control. He is unwilling to make any other changes at this time.  -Increase Levemir to 30 units qAM and 10 units qPM.  -Continue oral regimen -Extensively discussed pathophysiology of DM, recommended lifestyle interventions, dietary effects on glycemic control -Counseled on s/sx of and management of hypoglycemia -Next A1C anticipated 03/2020.  ASCVD risk - secondary prevention in patient with DM. Last LDL is controlled. High intensity statin and ASA indicated at this time. -Continued aspirin 81 mg  -Continued atorvastatin 80 mg.   Written patient instructions provided.  Total time in face to face counseling 30 minutes.   Follow up w/ PCP.  Butch Penny, PharmD, CPP Clinical Pharmacist [facility] [phone]

## 2020-02-11 ENCOUNTER — Telehealth: Payer: Self-pay

## 2020-02-11 ENCOUNTER — Other Ambulatory Visit: Payer: Self-pay | Admitting: Physician Assistant

## 2020-02-11 ENCOUNTER — Ambulatory Visit (INDEPENDENT_AMBULATORY_CARE_PROVIDER_SITE_OTHER): Payer: Medicare Other | Admitting: Physician Assistant

## 2020-02-11 ENCOUNTER — Encounter: Payer: Self-pay | Admitting: Physician Assistant

## 2020-02-11 ENCOUNTER — Other Ambulatory Visit: Payer: Self-pay

## 2020-02-11 VITALS — BP 132/74 | HR 63 | Ht 65.0 in | Wt 137.4 lb

## 2020-02-11 DIAGNOSIS — I10 Essential (primary) hypertension: Secondary | ICD-10-CM | POA: Diagnosis not present

## 2020-02-11 DIAGNOSIS — E118 Type 2 diabetes mellitus with unspecified complications: Secondary | ICD-10-CM

## 2020-02-11 DIAGNOSIS — E782 Mixed hyperlipidemia: Secondary | ICD-10-CM | POA: Diagnosis not present

## 2020-02-11 DIAGNOSIS — E1165 Type 2 diabetes mellitus with hyperglycemia: Secondary | ICD-10-CM | POA: Diagnosis not present

## 2020-02-11 DIAGNOSIS — I2510 Atherosclerotic heart disease of native coronary artery without angina pectoris: Secondary | ICD-10-CM | POA: Diagnosis not present

## 2020-02-11 DIAGNOSIS — K219 Gastro-esophageal reflux disease without esophagitis: Secondary | ICD-10-CM

## 2020-02-11 DIAGNOSIS — IMO0002 Reserved for concepts with insufficient information to code with codable children: Secondary | ICD-10-CM

## 2020-02-11 MED ORDER — AMLODIPINE BESYLATE 5 MG PO TABS: 5.0000 mg | ORAL_TABLET | Freq: Every day | ORAL | 3 refills | Status: DC

## 2020-02-11 MED ORDER — PANTOPRAZOLE SODIUM 40 MG PO TBEC: 40.0000 mg | DELAYED_RELEASE_TABLET | Freq: Every day | ORAL | 11 refills | Status: DC

## 2020-02-11 MED ORDER — NITROGLYCERIN 0.4 MG SL SUBL: 0.4000 mg | SUBLINGUAL_TABLET | SUBLINGUAL | 1 refills | Status: DC | PRN

## 2020-02-11 MED ORDER — ATORVASTATIN CALCIUM 80 MG PO TABS: ORAL_TABLET | ORAL | 3 refills | Status: DC

## 2020-02-11 MED ORDER — METOPROLOL TARTRATE 25 MG PO TABS: 25.0000 mg | ORAL_TABLET | Freq: Two times a day (BID) | ORAL | 1 refills | Status: DC

## 2020-02-11 MED ORDER — LOSARTAN POTASSIUM 100 MG PO TABS: 100.0000 mg | ORAL_TABLET | Freq: Every day | ORAL | 3 refills | Status: DC

## 2020-02-11 MED FILL — AMLODIPINE BESYLATE 5 MG TA: 5 | 90 days supply | Qty: 90 | Fill #0

## 2020-02-11 MED FILL — ATORVASTATIN CALCIUM 80 MG: 80 | 90 days supply | Qty: 90 | Fill #0

## 2020-02-11 MED FILL — METOPROLOL TARTRATE 25 MG T: 25 | 90 days supply | Qty: 180 | Fill #0

## 2020-02-11 MED FILL — PANTOPRAZOLE SOD DR 40 MG T: 40 | 30 days supply | Qty: 30 | Fill #0

## 2020-02-11 MED FILL — NITROGLYCERIN 0.4 MG TAB SL: 0.4 | 45 days supply | Qty: 100 | Fill #0

## 2020-02-11 MED FILL — LOSARTAN POTASSIUM 100 MG T: 100 | 90 days supply | Qty: 90 | Fill #0

## 2020-02-11 NOTE — Progress Notes (Signed)
Cardiology Office Note:    Date:  02/11/2020   ID:  Carl Clark, DOB 1950/11/02, MRN [id number]  PCP:  Carl Pounds, NP  [facility] Cardiologist:  Sherren Mocha, MD  [facility] Electrophysiologist:  None   Chief Complaint: yearly follow up   History of Present Illness:    Carl Clark is a 69 y.o. male with a hx of CAD s/p CABG  HTN, DM, HLD and syncope presents for follow up.  Hx of NSTEMI 08/2016 s/p CABG (LIMA-LAD, SVG-diagonal, SVG-OM). Also with hx of recurrent syncope, felt to be vasovagal. Seen by Richardson Dopp 08/2018>> recommended echo and monitor for syncope.   Echo 09/2018 showed normal LVEF without WM abnormality. No monitor.  Last seen by me 11/2019 before going back to [place]. He was doing well.  Here today for follow up with in-house Arabic interpreter.  Reviewed recent lab work done by PCP.  Hemoglobin A1c 13.5 up from 9.7-year ago.  He is eating a lot of sweets/carbohydrate.  He reports right sided burning chest discomfort in the morning.  Worse after eating.  He does stocking for living.  No exertional chest discomfort.  He denies palpitation, dizziness, orthopnea, PND, syncope or lower extremity edema.  Past Medical History:  Diagnosis Date  . CAD (coronary artery disease) 03/16/2017   S/p NSTEMI 6/18 >> LHC: multivessel CAD >> s/p CABG // Echo 6/18: Mild LVH, EF 60-65, normal wall motion, grade 1 diastolic dysfunction, normal RV SF, PASP 36 (borderline pulmonary hypertension) // Pre-CABG Dopplers 09/13/16: Bilateral ICA 1-39  . Echocardiogram    Echo 09/2018: EF 60-65, normal wall motion, RVSP 24.9  . Family history of anesthesia complication    "never had anesthesia" (07/14/2012)  . History of non-ST elevation myocardial infarction (NSTEMI) 09/12/2016   Treated with CABG  . History of rectal bleeding    secondary to diverticulum affecting right hepatic flexure - 06/2012  . Hypertension   . Mild carotid artery disease (HCC)    a.  1-39% bilaterally preCABG in 2018.  . Type II diabetes mellitus ([place])     Past Surgical History:  Procedure Laterality Date  . COLONOSCOPY Left 07/14/2012   Procedure: COLONOSCOPY;  Surgeon: Arta Silence, MD;  Location: [facility] ENDOSCOPY;  Service: Endoscopy;  Laterality: Left;  . CORONARY ARTERY BYPASS GRAFT N/A 09/15/2016   Procedure: CORONARY ARTERY BYPASS GRAFTING (CABG) x 3, LIMA to LAD, SVG to DIAGONAL, SVG to OM2, USING LEFT MAMMARY ARTERY AND RIGHT GREATER SAPHENOUS VEIN HARVESTED ENDOSCOPICALLY;  Surgeon: Grace Isaac, MD;  Location: [place];  Service: Open Heart Surgery;  Laterality: N/A;  . ESOPHAGOGASTRODUODENOSCOPY N/A 07/13/2012   Procedure: ESOPHAGOGASTRODUODENOSCOPY (EGD);  Surgeon: Arta Silence, MD;  Location: [facility] ENDOSCOPY;  Service: Endoscopy;  Laterality: N/A;  pt in ED A8  . LEFT HEART CATH AND CORONARY ANGIOGRAPHY N/A 09/12/2016   Procedure: Left Heart Cath and Coronary Angiography;  Surgeon: Sherren Mocha, MD;  Location: [place] CV LAB;  Service: Cardiovascular;  Laterality: N/A;  . NO PAST SURGERIES    . TEE WITHOUT CARDIOVERSION N/A 09/15/2016   Procedure: TRANSESOPHAGEAL ECHOCARDIOGRAM (TEE);  Surgeon: Grace Isaac, MD;  Location: [place];  Service: Open Heart Surgery;  Laterality: N/A;    Current Medications: Current Meds  Medication Sig  . amLODipine (NORVASC) 5 MG tablet Take 1 tablet (5 mg total) by mouth daily. Please fill as a 90 day supply  . aspirin 81 MG chewable tablet Chew by mouth daily.  Marland Kitchen atorvastatin (LIPITOR)  80 MG tablet TAKE 1 TABLET BY MOUTH ONCE DAILY AT  6PM. Please fill as a 90 day supply  . betamethasone dipropionate (DIPROLENE) 0.05 % cream Apply topically 2 (two) times daily.  . Blood Glucose Monitoring Suppl (ACCU-CHEK GUIDE ME) w/Device KIT 1 kit by Does not apply route 3 (three) times daily. Use to check blood sugar 3 times daily. E11.9  . canagliflozin (INVOKANA) 300 MG TABS tablet Take 1 tablet (300 mg total) by mouth daily  before breakfast. Please fill as a 90 day supply  . diclofenac (VOLTAREN) 75 MG EC tablet Take 1 tablet (75 mg total) by mouth 2 (two) times daily.  . fluocinonide cream (LIDEX) 3.15 % Apply 1 application topically 2 (two) times daily.  . fluticasone (FLONASE) 50 MCG/ACT nasal spray Place 2 sprays into both nostrils daily.  Marland Kitchen glucose blood (ONETOUCH VERIO) test strip Use as instructed to check blood sugar 3 times weekly before breakfast. E11.9.  . insulin detemir (LEVEMIR FLEXTOUCH) 100 UNIT/ML FlexPen Inject 30 units in the morning and 10 units in the evening.  . Insulin Pen Needle (TRUEPLUS PEN NEEDLES) 32G X 4 MM MISC Use as instructed. Inject into the skin twice daily. Patient requests 6 month supply  . ketoconazole (NIZORAL) 2 % shampoo Apply 1 application topically 2 (two) times a week.  . losartan (COZAAR) 100 MG tablet Take 1 tablet (100 mg total) by mouth daily. Please fill as a 90 day supply  . metFORMIN (GLUCOPHAGE) 1000 MG tablet Take 1 tablet (1,000 mg total) by mouth 2 (two) times daily with a meal. Please fill as a 90 day supply  . metoprolol tartrate (LOPRESSOR) 25 MG tablet Take 1 tablet (25 mg total) by mouth 2 (two) times daily. Please fill as a 90 day supply  . Misc. Devices MISC Please provide patient with an insurance approved QUAD CANE  . nitroGLYCERIN (NITROSTAT) 0.4 MG SL tablet Place 1 tablet (0.4 mg total) under the tongue every 5 (five) minutes as needed for chest pain.  Glory Rosebush Delica Lancets 40G MISC Use as instructed to check blood sugar 3 times weekly before breakfast. E11.9.  . [DISCONTINUED] amLODipine (NORVASC) 5 MG tablet Take 1 tablet (5 mg total) by mouth daily. Please fill as a 90 day supply  . [DISCONTINUED] atorvastatin (LIPITOR) 80 MG tablet TAKE 1 TABLET BY MOUTH ONCE DAILY AT  6PM. Please fill as a 90 day supply  . [DISCONTINUED] losartan (COZAAR) 100 MG tablet Take 1 tablet (100 mg total) by mouth daily. Please fill as a 90 day supply  . [DISCONTINUED]  metoprolol tartrate (LOPRESSOR) 25 MG tablet Take 1 tablet (25 mg total) by mouth 2 (two) times daily. Please fill as a 90 day supply  . [DISCONTINUED] nitroGLYCERIN (NITROSTAT) 0.4 MG SL tablet Place 1 tablet (0.4 mg total) under the tongue every 5 (five) minutes as needed for chest pain.     Allergies:   Victoza [liraglutide]   Social History   Socioeconomic History  . Marital status: Widowed    Spouse name: Not on file  . Number of children: Not on file  . Years of education: Not on file  . Highest education level: Not on file  Occupational History  . Not on file  Tobacco Use  . Smoking status: Never Smoker  . Smokeless tobacco: Never Used  Vaping Use  . Vaping Use: Never used  Substance and Sexual Activity  . Alcohol use: No  . Drug use: No  . Sexual activity:  Not Currently  Other Topics Concern  . Not on file  Social History Narrative  . Not on file   Social Determinants of Health   Financial Resource Strain:   . Difficulty of Paying Living Expenses: Not on file  Food Insecurity:   . Worried About Charity fundraiser in the Last Year: Not on file  . Ran Out of Food in the Last Year: Not on file  Transportation Needs:   . Lack of Transportation (Medical): Not on file  . Lack of Transportation (Non-Medical): Not on file  Physical Activity:   . Days of Exercise per Week: Not on file  . Minutes of Exercise per Session: Not on file  Stress:   . Feeling of Stress : Not on file  Social Connections:   . Frequency of Communication with Friends and Family: Not on file  . Frequency of Social Gatherings with Friends and Family: Not on file  . Attends Religious Services: Not on file  . Active Member of Clubs or Organizations: Not on file  . Attends Archivist Meetings: Not on file  . Marital Status: Not on file     Family History: The patient's family history includes Diabetes in his father and mother.    ROS:   Please see the history of present illness.      All other systems reviewed and are negative.   EKGs/Labs/Other Studies Reviewed:    The following studies were reviewed today:  Echocardiogram: 09/2018 1. The left ventricle has normal systolic function with an ejection fraction of 60-65%. The cavity size was normal. Left ventricular diastolic parameters were normal. No evidence of left ventricular regional wall motion abnormalities. 2. The right ventricle has normal systolic function. The cavity was normal. There is no increase in right ventricular wall thickness. Right ventricular systolic pressure is normal with an estimated pressure of 24.9 mmHg.   Left Heart Cath and Coronary Angiography 08/2016  Conclusion  1. Severe proximal LAD stenosis  2. Severe proximal LCx stenosis 3. Mild-moderate RCA stenosis 4. Preserved LV systolic function with normal LVEF (>65%) and normal LVEDP  Pt essentially has left main equivalent with severe proximal/ostial LAD and LCx stenoses. Also has diffusely disease circumflex, OM, and diagonal branches. Recommend TCTS consult for CABG in setting of diabetes and severe multivessel CAD. Resume heparin post-cath.    OPERATIVE REPORT  09/2016  SURGICAL PROCEDURES: Coronary artery bypass grafting x3 with left internal mammary to the left anterior descending coronary artery, reverse saphenous vein graft to the diagonal coronary artery, reverse saphenous vein graft to the second obtuse marginal with right thigh and calf greater saphenous vein harvesting endoscopically.   EKG:  EKG is ordered today.  The ekg ordered today demonstrates normal sinus rhythm  Recent Labs: 10/10/2019: Hemoglobin 12.9; Platelets 287 01/06/2020: ALT 12; BUN 19; Creatinine, Ser 1.09; Potassium 4.7; Sodium 135  Recent Lipid Panel    Component Value Date/Time   CHOL 108 10/10/2019 1007   TRIG 146 10/10/2019 1007   HDL 30 (L) 10/10/2019 1007   CHOLHDL 3.6 10/10/2019 1007   CHOLHDL 4.8 09/12/2016 0806   VLDL 29 09/12/2016  0806   LDLCALC 53 10/10/2019 1007    Physical Exam:    VS:  BP 132/74   Pulse 63   Ht _0  (1.651 m)   Wt 137 lb 6.4 oz (62.3 kg)   SpO2 98%   BMI 22.86 kg/m     Wt Readings from Last 3 Encounters:  02/11/20  137 lb 6.4 oz (62.3 kg)  01/06/20 135 lb (61.2 kg)  10/10/19 143 lb 6.4 oz (65 kg)     GEN: Well nourished, well developed in no acute distress HEENT: Normal NECK: No JVD; No carotid bruits LYMPHATICS: No lymphadenopathy CARDIAC: RRR, no murmurs, rubs, gallops RESPIRATORY:  Clear to auscultation without rales, wheezing or rhonchi  ABDOMEN: Soft, non-tender, non-distended MUSCULOSKELETAL:  No edema; No deformity  SKIN: Warm and dry NEUROLOGIC:  Alert and oriented x 3 PSYCHIATRIC:  Normal affect   ASSESSMENT AND PLAN:    1. CAD status post CABG in 2018 -No angina.  Continue aspirin,  Lopressor, Lipitor and losartan. -His chest discomfort consistent with GERD. -He does heavy activity at work without chest discomfort   2. HTN -Blood pressure stable on current medication.  Continue amlodipine, losartan and metoprolol.  3.  Hyperlipidemia -Continue statin. - 10/10/2019: Cholesterol, Total 108; HDL 30; LDL Chol Calc (NIH) 53; Triglycerides 146  4.  History of recurrent syncope -Felt to be vasovagal.  Echocardiogram recently showed normal LV function without wall motion abnormality.  Did not got monitor.   -No recurrent episode  5.  Diabetes mellitus -Per primary care provider. -Recent hemoglobin A1c 13.5 -At length discussion regarding limiting carbohydrate in diet  6.  GERD -Start PPI   Medication Adjustments/Labs and Tests Ordered: Current medicines are reviewed at length with the patient today.  Concerns regarding medicines are outlined above.  Orders Placed This Encounter  Procedures  . EKG 12-Lead   Meds ordered this encounter  Medications  . pantoprazole (PROTONIX) 40 MG tablet    Sig: Take 1 tablet (40 mg total) by mouth daily.     Dispense:  30 tablet    Refill:  11  . atorvastatin (LIPITOR) 80 MG tablet    Sig: TAKE 1 TABLET BY MOUTH ONCE DAILY AT  6PM. Please fill as a 90 day supply    Dispense:  90 tablet    Refill:  3  . amLODipine (NORVASC) 5 MG tablet    Sig: Take 1 tablet (5 mg total) by mouth daily. Please fill as a 90 day supply    Dispense:  90 tablet    Refill:  3  . losartan (COZAAR) 100 MG tablet    Sig: Take 1 tablet (100 mg total) by mouth daily. Please fill as a 90 day supply    Dispense:  180 tablet    Refill:  3  . metoprolol tartrate (LOPRESSOR) 25 MG tablet    Sig: Take 1 tablet (25 mg total) by mouth 2 (two) times daily. Please fill as a 90 day supply    Dispense:  180 tablet    Refill:  1  . nitroGLYCERIN (NITROSTAT) 0.4 MG SL tablet    Sig: Place 1 tablet (0.4 mg total) under the tongue every 5 (five) minutes as needed for chest pain.    Dispense:  90 tablet    Refill:  1    [place], that is the reason for high quantity    Patient Instructions  Medication Instructions:  Your physician has recommended you make the following change in your medication:  1.  START Protonix 40 mg taking 1 daily   *If you need a refill on your cardiac medications before your next appointment, please call your pharmacy*   Lab Work: None ordered  If you have labs (blood work) drawn today and your tests are completely normal, you will  receive your results only by: Marland Kitchen MyChart Message (if you have MyChart) OR . A paper copy in the mail If you have any lab test that is abnormal or we need to change your treatment, we will call you to review the results.   Testing/Procedures: None ordered   Follow-Up: At [facility], you and your health needs are our priority.  As part of our continuing mission to provide you with exceptional heart care, we have created designated Provider Care Teams.  These Care Teams include your primary Cardiologist (physician) and Advanced  Practice Providers (APPs -  Physician Assistants and Nurse Practitioners) who all work together to provide you with the care you need, when you need it.  We recommend signing up for the patient portal called "MyChart".  Sign up information is provided on this After Visit Summary.  MyChart is used to connect with patients for Virtual Visits (Telemedicine).  Patients are able to view lab/test results, encounter notes, upcoming appointments, etc.  Non-urgent messages can be sent to your provider as well.   To learn more about what you can do with MyChart, go to [email or web address].    Your next appointment:   12 month(s)  The format for your next appointment:   In Person  Provider:   You may see Sherren Mocha, MD or one of the following Advanced Practice Providers on your designated Care Team:    Richardson Dopp, PA-C  Robbie Lis, PA-C    Other Instructions      Signed, Leanor Kail, [place]  02/11/2020 11:12 AM    [place]

## 2020-02-11 NOTE — Patient Instructions (Addendum)
Medication Instructions:  Your physician has recommended you make the following change in your medication:  1.  START Protonix 40 mg taking 1 daily   *If you need a refill on your cardiac medications before your next appointment, please call your pharmacy*   Lab Work: None ordered  If you have labs (blood work) drawn today and your tests are completely normal, you will receive your results only by: Marland Kitchen MyChart Message (if you have MyChart) OR . A paper copy in the mail If you have any lab test that is abnormal or we need to change your treatment, we will call you to review the results.   Testing/Procedures: None ordered   Follow-Up: At [facility], you and your health needs are our priority.  As part of our continuing mission to provide you with exceptional heart care, we have created designated Provider Care Teams.  These Care Teams include your primary Cardiologist (physician) and Advanced Practice Providers (APPs -  Physician Assistants and Nurse Practitioners) who all work together to provide you with the care you need, when you need it.  We recommend signing up for the patient portal called "MyChart".  Sign up information is provided on this After Visit Summary.  MyChart is used to connect with patients for Virtual Visits (Telemedicine).  Patients are able to view lab/test results, encounter notes, upcoming appointments, etc.  Non-urgent messages can be sent to your provider as well.   To learn more about what you can do with MyChart, go to [email or web address].    Your next appointment:   12 month(s)  The format for your next appointment:   In Person  Provider:   You may see Tonny Bollman, MD or one of the following Advanced Practice Providers on your designated Care Team:    [name], PA-C  Chelsea Aus, [place]    Other Instructions

## 2020-02-11 NOTE — Telephone Encounter (Signed)
Form completion for health insurance reward. Patient would like CMA to mail it home to patient' address.

## 2020-03-17 DIAGNOSIS — Z23 Encounter for immunization: Secondary | ICD-10-CM | POA: Diagnosis not present

## 2020-04-07 ENCOUNTER — Telehealth: Payer: Medicare Other | Admitting: Nurse Practitioner

## 2020-04-07 DIAGNOSIS — Z23 Encounter for immunization: Secondary | ICD-10-CM | POA: Diagnosis not present

## 2020-04-27 MED FILL — INVOKANA 300 MG TABLET: 300 | 90 days supply | Qty: 90 | Fill #1

## 2020-04-27 MED FILL — LEVEMIR FLEXTOUCH 100 UNITS: 100 | 90 days supply | Qty: 36 | Fill #0

## 2020-04-27 MED FILL — METFORMIN HCL 1000 MG TABS: 1000 | 90 days supply | Qty: 180 | Fill #1

## 2020-05-04 ENCOUNTER — Ambulatory Visit: Payer: Medicare Other | Admitting: Pharmacist

## 2020-05-11 ENCOUNTER — Ambulatory Visit: Payer: Medicare Other | Attending: Nurse Practitioner | Admitting: Pharmacist

## 2020-05-11 ENCOUNTER — Other Ambulatory Visit: Payer: Self-pay

## 2020-05-25 ENCOUNTER — Ambulatory Visit: Payer: Medicare Other | Attending: Nurse Practitioner | Admitting: Nurse Practitioner

## 2020-05-25 ENCOUNTER — Other Ambulatory Visit: Payer: Self-pay

## 2020-05-25 ENCOUNTER — Other Ambulatory Visit: Payer: Self-pay | Admitting: Nurse Practitioner

## 2020-05-25 ENCOUNTER — Encounter: Payer: Self-pay | Admitting: Nurse Practitioner

## 2020-05-25 VITALS — BP 143/80 | HR 82 | Resp 16 | Wt 138.4 lb

## 2020-05-25 DIAGNOSIS — E1165 Type 2 diabetes mellitus with hyperglycemia: Secondary | ICD-10-CM | POA: Diagnosis not present

## 2020-05-25 DIAGNOSIS — Z794 Long term (current) use of insulin: Secondary | ICD-10-CM | POA: Diagnosis not present

## 2020-05-25 DIAGNOSIS — N529 Male erectile dysfunction, unspecified: Secondary | ICD-10-CM | POA: Insufficient documentation

## 2020-05-25 DIAGNOSIS — Z833 Family history of diabetes mellitus: Secondary | ICD-10-CM | POA: Diagnosis not present

## 2020-05-25 DIAGNOSIS — E118 Type 2 diabetes mellitus with unspecified complications: Secondary | ICD-10-CM

## 2020-05-25 DIAGNOSIS — E785 Hyperlipidemia, unspecified: Secondary | ICD-10-CM | POA: Diagnosis not present

## 2020-05-25 DIAGNOSIS — I2510 Atherosclerotic heart disease of native coronary artery without angina pectoris: Secondary | ICD-10-CM | POA: Insufficient documentation

## 2020-05-25 DIAGNOSIS — Z7901 Long term (current) use of anticoagulants: Secondary | ICD-10-CM | POA: Diagnosis not present

## 2020-05-25 DIAGNOSIS — Z7982 Long term (current) use of aspirin: Secondary | ICD-10-CM | POA: Insufficient documentation

## 2020-05-25 DIAGNOSIS — I10 Essential (primary) hypertension: Secondary | ICD-10-CM | POA: Insufficient documentation

## 2020-05-25 DIAGNOSIS — N489 Disorder of penis, unspecified: Secondary | ICD-10-CM | POA: Diagnosis not present

## 2020-05-25 DIAGNOSIS — K130 Diseases of lips: Secondary | ICD-10-CM

## 2020-05-25 DIAGNOSIS — I252 Old myocardial infarction: Secondary | ICD-10-CM | POA: Diagnosis not present

## 2020-05-25 DIAGNOSIS — IMO0002 Reserved for concepts with insufficient information to code with codable children: Secondary | ICD-10-CM

## 2020-05-25 DIAGNOSIS — R21 Rash and other nonspecific skin eruption: Secondary | ICD-10-CM | POA: Insufficient documentation

## 2020-05-25 DIAGNOSIS — Z79899 Other long term (current) drug therapy: Secondary | ICD-10-CM | POA: Diagnosis not present

## 2020-05-25 LAB — GLUCOSE, POCT (MANUAL RESULT ENTRY): POC Glucose: 263 mg/dl — AB (ref 70–99)

## 2020-05-25 LAB — POCT GLYCOSYLATED HEMOGLOBIN (HGB A1C): HbA1c, POC (controlled diabetic range): 9.8 % — AB (ref 0.0–7.0)

## 2020-05-25 MED ORDER — ONETOUCH DELICA LANCETS 33G MISC: 6 refills | Status: DC

## 2020-05-25 MED ORDER — METOPROLOL TARTRATE 25 MG PO TABS: 25.0000 mg | ORAL_TABLET | Freq: Two times a day (BID) | ORAL | 1 refills | Status: DC

## 2020-05-25 MED ORDER — LEVEMIR FLEXTOUCH 100 UNIT/ML ~~LOC~~ SOPN: PEN_INJECTOR | SUBCUTANEOUS | 2 refills | Status: DC

## 2020-05-25 MED ORDER — METFORMIN HCL 1000 MG PO TABS: 1000.0000 mg | ORAL_TABLET | Freq: Two times a day (BID) | ORAL | 1 refills | Status: DC

## 2020-05-25 MED ORDER — TRUEPLUS PEN NEEDLES 32G X 4 MM MISC
11 refills | Status: DC
Start: 2020-05-25 — End: 2020-08-24

## 2020-05-25 MED ORDER — VALACYCLOVIR HCL 1 G PO TABS: 2000.0000 mg | ORAL_TABLET | Freq: Two times a day (BID) | ORAL | 1 refills | Status: AC

## 2020-05-25 MED ORDER — ONETOUCH VERIO VI STRP: ORAL_STRIP | 6 refills | Status: DC

## 2020-05-25 MED ORDER — AMLODIPINE BESYLATE 5 MG PO TABS
5.0000 mg | ORAL_TABLET | Freq: Every day | ORAL | 3 refills | Status: DC
Start: 2020-05-25 — End: 2020-08-24

## 2020-05-25 MED ORDER — LOSARTAN POTASSIUM 100 MG PO TABS
100.0000 mg | ORAL_TABLET | Freq: Every day | ORAL | 3 refills | Status: DC
Start: 2020-05-25 — End: 2020-08-24

## 2020-05-25 MED FILL — valACYclovir HCL 1 GM TABS: 1 | 1 days supply | Qty: 4 | Fill #0

## 2020-05-25 MED FILL — TRUEPLUS PEN NDL 32GX5/32: 32G X 4 MM | 90 days supply | Qty: 200 | Fill #0

## 2020-05-25 MED FILL — LOSARTAN POTASSIUM 100 MG T: 100 | 90 days supply | Qty: 180 | Fill #0

## 2020-05-25 MED FILL — AMLODIPINE BESYLATE 5 MG TA: 5 | 90 days supply | Qty: 90 | Fill #0

## 2020-05-25 MED FILL — METOPROLOL TARTRATE 25 MG T: 25 | 90 days supply | Qty: 180 | Fill #0

## 2020-05-25 NOTE — Progress Notes (Signed)
Assessment & Plan:  Carl Clark was seen today for diabetes and hypertension.  Diagnoses and all orders for this visit:  Diabetes mellitus type 2, uncontrolled, with complications (HCC) -     POCT glucose (manual entry) -     POCT glycosylated hemoglobin (Hb A1C) -     insulin detemir (LEVEMIR FLEXTOUCH) 100 UNIT/ML FlexPen; Inject 30 units in the morning and 30 units in the evening. -     metFORMIN (GLUCOPHAGE) 1000 MG tablet; Take 1 tablet (1,000 mg total) by mouth 2 (two) times daily with a meal. Please fill as a 90 day supply -     glucose blood (ONETOUCH VERIO) test strip; Use as instructed to check blood sugar 3 times weekly before breakfast. E11.9. -     Insulin Pen Needle (TRUEPLUS PEN NEEDLES) 32G X 4 MM MISC; Use as instructed. Inject into the skin twice daily. Patient requests 6 month supply -     OneTouch Delica Lancets 16X MISC; Use as instructed to check blood sugar 3 times weekly before breakfast. E11.9. Continue blood sugar control as discussed in office today, low carbohydrate diet, and regular physical exercise as tolerated, 150 minutes per week (30 min each day, 5 days per week, or 50 min 3 days per week). Keep blood sugar logs with fasting goal of 90-130 mg/dl, post prandial (after you eat) less than 180.  For Hypoglycemia: BS <60 and Hyperglycemia BS >400; contact the clinic ASAP. Annual eye exams and foot exams are recommended.   Essential hypertension -     metoprolol tartrate (LOPRESSOR) 25 MG tablet; Take 1 tablet (25 mg total) by mouth 2 (two) times daily. Please fill as a 90 day supply -     losartan (COZAAR) 100 MG tablet; Take 1 tablet (100 mg total) by mouth daily. Please fill as a 90 day supply -     amLODipine (NORVASC) 5 MG tablet; Take 1 tablet (5 mg total) by mouth daily. Please fill as a 90 day supply Continue all antihypertensives as prescribed.  Remember to bring in your blood pressure log with you for your follow up appointment.  DASH/Mediterranean  Diets are healthier choices for HTN.    Dyslipidemia, goal LDL below 70 INSTRUCTIONS: Work on a low fat, heart healthy diet and participate in regular aerobic exercise program by working out at least 150 minutes per week; 5 days a week-30 minutes per day. Avoid red meat/beef/steak,  fried foods. junk foods, sodas, sugary drinks, unhealthy snacking, alcohol and smoking.  Drink at least 80 oz of water per day and monitor your carbohydrate intake daily.    Rash on lips -     Ambulatory referral to Dermatology -     valACYclovir (VALTREX) 1000 MG tablet; Take 2 tablets (2,000 mg total) by mouth 2 (two) times daily for 1 day.  Penile abnormality -     Ambulatory referral to Urology    Patient has been counseled on age-appropriate routine health concerns for screening and prevention. These are reviewed and up-to-date. Referrals have been placed accordingly. Immunizations are up-to-date or declined.    Subjective:   Chief Complaint  Patient presents with  . Diabetes  . Hypertension   HPI Carl Clark 70 y.o. male presents to office today for follow up.  He has a past medical history of CAD(03/16/2017), (NSTEMI) (09/12/2016), History of rectal bleeding, Hypertension, Mild carotid artery disease, and Type II diabetes mellitus.   He would like to be referred to Urology for erectile  dysfunction and penile prosthesis. Viagra has been ineffective.   DM2 Poorly controlled. Average readings 180-200s. Will increase Levemir to 30 units BID. He will continue metformin 1000 mg BID. LDL at goal with atorvastatin 80 mg daily. Lab Results  Component Value Date   HGBA1C 9.8 (A) 05/25/2020   Lab Results  Component Value Date   LDLCALC 53 10/10/2019    Essential Hypertension Elevated today. He is currently taking lopressor 25 mg BID, amlodipine 5 mg daily and losartan 100 mg daily. Denies chest pain, shortness of breath, palpitations, lightheadedness, dizziness, headaches or BLE edema.   BP Readings from Last 3 Encounters:  05/25/20 (!) 143/80  02/11/20 132/74  01/06/20 (!) 161/92   Rash He endorses painful lesions and peeling rash on his lips and the inside of his mouth. We have tried valtrex which he reports was ineffective. 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 I am unable to visualize any lesions today on exam. He would ike to see a dermatologist for this.     Review of Systems  Constitutional: Negative for fever, malaise/fatigue and weight loss.  HENT: Negative.  Negative for nosebleeds.   Eyes: Negative.  Negative for blurred vision, double vision and photophobia.  Respiratory: Negative.  Negative for cough and shortness of breath.   Cardiovascular: Negative.  Negative for chest pain, palpitations and leg swelling.  Gastrointestinal: Negative.  Negative for heartburn, nausea and vomiting.  Musculoskeletal: Negative.  Negative for myalgias.  Skin: Positive for itching and rash.  Neurological: Negative.  Negative for dizziness, focal weakness, seizures and headaches.  Psychiatric/Behavioral: Negative.  Negative for suicidal ideas.    Past Medical History:  Diagnosis Date  . CAD (coronary artery disease) 03/16/2017   S/p NSTEMI 6/18 >> LHC: multivessel CAD >> s/p CABG // Echo 6/18: Mild LVH, EF 60-65, normal wall motion, grade 1 diastolic dysfunction, normal RV SF, PASP 36 (borderline pulmonary hypertension) // Pre-CABG Dopplers 09/13/16: Bilateral ICA 1-39  . Echocardiogram    Echo 09/2018: EF 60-65, normal wall motion, RVSP 24.9  . Family history of anesthesia complication    "never had anesthesia" (07/14/2012)  . History of non-ST elevation myocardial infarction (NSTEMI) 09/12/2016   Treated with CABG  . History of rectal bleeding    secondary to diverticulum affecting right hepatic flexure - 06/2012  . Hypertension   . Mild carotid artery disease (HCC)    a. 1-39% bilaterally preCABG in 2018.  . Type II diabetes mellitus ([place])     Past Surgical History:   Procedure Laterality Date  . COLONOSCOPY Left 07/14/2012   Procedure: COLONOSCOPY;  Surgeon: Arta Silence, MD;  Location: [facility] ENDOSCOPY;  Service: Endoscopy;  Laterality: Left;  . CORONARY ARTERY BYPASS GRAFT N/A 09/15/2016   Procedure: CORONARY ARTERY BYPASS GRAFTING (CABG) x 3, LIMA to LAD, SVG to DIAGONAL, SVG to OM2, USING LEFT MAMMARY ARTERY AND RIGHT GREATER SAPHENOUS VEIN HARVESTED ENDOSCOPICALLY;  Surgeon: Grace Isaac, MD;  Location: [place];  Service: Open Heart Surgery;  Laterality: N/A;  . ESOPHAGOGASTRODUODENOSCOPY N/A 07/13/2012   Procedure: ESOPHAGOGASTRODUODENOSCOPY (EGD);  Surgeon: Arta Silence, MD;  Location: [facility] ENDOSCOPY;  Service: Endoscopy;  Laterality: N/A;  pt in ED A8  . LEFT HEART CATH AND CORONARY ANGIOGRAPHY N/A 09/12/2016   Procedure: Left Heart Cath and Coronary Angiography;  Surgeon: Sherren Mocha, MD;  Location: [place] CV LAB;  Service: Cardiovascular;  Laterality: N/A;  . NO PAST SURGERIES    . TEE WITHOUT CARDIOVERSION N/A 09/15/2016   Procedure: TRANSESOPHAGEAL  ECHOCARDIOGRAM (TEE);  Surgeon: Grace Isaac, MD;  Location: [place];  Service: Open Heart Surgery;  Laterality: N/A;    Family History  Problem Relation Age of Onset  . Diabetes Mother   . Diabetes Father     Social History Reviewed with no changes to be made today.   Outpatient Medications Prior to Visit  Medication Sig Dispense Refill  . atorvastatin (LIPITOR) 80 MG tablet TAKE 1 TABLET BY MOUTH ONCE DAILY AT  6PM. Please fill as a 90 day supply 90 tablet 3  . Blood Glucose Monitoring Suppl (ACCU-CHEK GUIDE ME) w/Device KIT 1 kit by Does not apply route 3 (three) times daily. Use to check blood sugar 3 times daily. E11.9 1 kit 0  . fluticasone (FLONASE) 50 MCG/ACT nasal spray Place 2 sprays into both nostrils daily. 16 g 6  . Misc. Devices MISC Please provide patient with an insurance approved QUAD CANE 1 each 0  . nitroGLYCERIN (NITROSTAT) 0.4 MG SL tablet Place 1 tablet (0.4 mg  total) under the tongue every 5 (five) minutes as needed for chest pain. 90 tablet 1  . pantoprazole (PROTONIX) 40 MG tablet Take 1 tablet (40 mg total) by mouth daily. 30 tablet 11  . amLODipine (NORVASC) 5 MG tablet Take 1 tablet (5 mg total) by mouth daily. Please fill as a 90 day supply 90 tablet 3  . aspirin 81 MG chewable tablet Chew by mouth daily.    Marland Kitchen glucose blood (ONETOUCH VERIO) test strip Use as instructed to check blood sugar 3 times weekly before breakfast. E11.9. 100 each 6  . insulin detemir (LEVEMIR FLEXTOUCH) 100 UNIT/ML FlexPen Inject 30 units in the morning and 10 units in the evening. 15 mL 2  . Insulin Pen Needle (TRUEPLUS PEN NEEDLES) 32G X 4 MM MISC Use as instructed. Inject into the skin twice daily. Patient requests 6 month supply 200 each 11  . losartan (COZAAR) 100 MG tablet Take 1 tablet (100 mg total) by mouth daily. Please fill as a 90 day supply 180 tablet 3  . metoprolol tartrate (LOPRESSOR) 25 MG tablet Take 1 tablet (25 mg total) by mouth 2 (two) times daily. Please fill as a 90 day supply 180 tablet 1  . OneTouch Delica Lancets 70J MISC Use as instructed to check blood sugar 3 times weekly before breakfast. E11.9. 100 each 6  . betamethasone dipropionate (DIPROLENE) 0.05 % cream Apply topically 2 (two) times daily. (Patient not taking: Reported on 05/25/2020) 90 g 0  . diclofenac (VOLTAREN) 75 MG EC tablet Take 1 tablet (75 mg total) by mouth 2 (two) times daily. (Patient not taking: Reported on 05/25/2020) 30 tablet 0  . fluocinonide cream (LIDEX) 5.00 % Apply 1 application topically 2 (two) times daily. (Patient not taking: Reported on 05/25/2020) 120 g 0  . ketoconazole (NIZORAL) 2 % shampoo Apply 1 application topically 2 (two) times a week. (Patient not taking: Reported on 05/25/2020) 120 mL 2  . metFORMIN (GLUCOPHAGE) 1000 MG tablet Take 1 tablet (1,000 mg total) by mouth 2 (two) times daily with a meal. Please fill as a 90 day supply 180 tablet 1   No  facility-administered medications prior to visit.    Allergies  Allergen Reactions  . Victoza [Liraglutide] Swelling    Lip swelling, itching, rash       Objective:    BP (!) 143/80   Pulse 82   Resp 16   Wt 138 lb 6.4 oz (62.8 kg)  SpO2 95%   BMI 23.03 kg/m  Wt Readings from Last 3 Encounters:  05/25/20 138 lb 6.4 oz (62.8 kg)  02/11/20 137 lb 6.4 oz (62.3 kg)  01/06/20 135 lb (61.2 kg)    Physical Exam Vitals and nursing note reviewed.  Constitutional:      Appearance: He is well-developed.  HENT:     Head: Normocephalic and atraumatic.     Mouth/Throat:     Lips: Lesions present.     Comments: SEE PHOTO Cardiovascular:     Rate and Rhythm: Normal rate and regular rhythm.     Heart sounds: Normal heart sounds. No murmur heard. No friction rub. No gallop.   Pulmonary:     Effort: Pulmonary effort is normal. No tachypnea or respiratory distress.     Breath sounds: Normal breath sounds. No decreased breath sounds, wheezing, rhonchi or rales.  Chest:     Chest wall: No tenderness.  Abdominal:     General: Bowel sounds are normal.     Palpations: Abdomen is soft.  Musculoskeletal:        General: Normal range of motion.     Cervical back: Normal range of motion.  Skin:    General: Skin is warm and dry.  Neurological:     Mental Status: He is alert and oriented to person, place, and time.     Coordination: Coordination normal.  Psychiatric:        Behavior: Behavior normal. Behavior is cooperative.        Thought Content: Thought content normal.        Judgment: Judgment normal.          Patient has been counseled extensively about nutrition and exercise as well as the importance of adherence with medications and regular follow-up. The patient was given clear instructions to go to ER or return to medical center if symptoms don't improve, worsen or new problems develop. The patient verbalized understanding.   Follow-up: Return in about 4 weeks (around  06/22/2020) for 4 weeks see luke meter check. See me in 3 months.   Gildardo Pounds, FNP-BC [facility] and Dana Custer, [place]   05/31/2020, 8:08 PM

## 2020-05-31 ENCOUNTER — Other Ambulatory Visit: Payer: Self-pay | Admitting: Nurse Practitioner

## 2020-05-31 ENCOUNTER — Encounter: Payer: Self-pay | Admitting: Nurse Practitioner

## 2020-05-31 MED ORDER — ASPIRIN 81 MG PO CHEW: 81.0000 mg | CHEWABLE_TABLET | Freq: Every day | ORAL | 3 refills | Status: DC

## 2020-06-22 ENCOUNTER — Other Ambulatory Visit: Payer: Self-pay

## 2020-06-22 ENCOUNTER — Ambulatory Visit: Payer: Medicare Other | Attending: Nurse Practitioner | Admitting: Pharmacist

## 2020-06-22 ENCOUNTER — Other Ambulatory Visit: Payer: Self-pay | Admitting: Pharmacist

## 2020-06-22 ENCOUNTER — Encounter: Payer: Self-pay | Admitting: Pharmacist

## 2020-06-22 DIAGNOSIS — E1165 Type 2 diabetes mellitus with hyperglycemia: Secondary | ICD-10-CM | POA: Diagnosis not present

## 2020-06-22 DIAGNOSIS — IMO0002 Reserved for concepts with insufficient information to code with codable children: Secondary | ICD-10-CM

## 2020-06-22 DIAGNOSIS — E118 Type 2 diabetes mellitus with unspecified complications: Secondary | ICD-10-CM | POA: Diagnosis not present

## 2020-06-22 LAB — GLUCOSE, POCT (MANUAL RESULT ENTRY): POC Glucose: 366 mg/dl — AB (ref 70–99)

## 2020-06-22 MED ORDER — TRULICITY 0.75 MG/0.5ML ~~LOC~~ SOAJ
0.7500 mg | SUBCUTANEOUS | 0 refills | Status: DC
Start: 2020-06-22 — End: 2020-07-20
  Filled 2020-06-22: qty 2, 28d supply, fill #0

## 2020-06-22 MED ORDER — EMPAGLIFLOZIN 10 MG PO TABS
10.0000 mg | ORAL_TABLET | Freq: Every day | ORAL | 2 refills | Status: DC
  Filled 2020-06-22: qty 30, 30d supply, fill #0
  Filled 2020-07-20: qty 30, 30d supply, fill #1

## 2020-06-22 MED ORDER — LEVEMIR FLEXTOUCH 100 UNIT/ML ~~LOC~~ SOPN
28.0000 [IU] | PEN_INJECTOR | Freq: Two times a day (BID) | SUBCUTANEOUS | 2 refills | Status: DC
  Filled 2020-06-22: qty 15, 26d supply, fill #0

## 2020-06-22 MED ORDER — LEVEMIR FLEXTOUCH 100 UNIT/ML ~~LOC~~ SOPN
28.0000 [IU] | PEN_INJECTOR | Freq: Two times a day (BID) | SUBCUTANEOUS | 2 refills | Status: DC
  Filled 2020-06-22: qty 15, 27d supply, fill #0

## 2020-06-22 NOTE — Patient Instructions (Signed)
Thank you for coming to see me today. Please do the following:  1. Continue metformin twice a day.  2. Reduce Levemir to 28 units twice a day.  3. Start taking Trulicity once weekly.  4. Start taking Jardiance once a day in the morning.  5. Stop invokana.  6. Continue checking blood sugars at home.  7. Continue making the lifestyle changes we've discussed together during our visit. Diet and exercise play a significant role in improving your blood sugars.  8. Follow-up with me in 1 month.     Hypoglycemia or low blood sugar:   Low blood sugar can happen quickly and may become an emergency if not treated right away.   While this shouldn't happen often, it can be brought upon if you skip a meal or do not eat enough. Also, if your insulin or other diabetes medications are dosed too high, this can cause your blood sugar to go to low.   Warning signs of low blood sugar include: 1. Feeling shaky or dizzy 2. Feeling weak or tired  3. Excessive hunger 4. Feeling anxious or upset  5. Sweating even when you aren't exercising  What to do if I experience low blood sugar? 1. Check your blood sugar with your meter. If lower than 70, proceed to step 2.  2. Treat with 3-4 glucose tablets or 3 packets of regular sugar. If these aren't around, you can try hard candy. Yet another option would be to drink 4 ounces of fruit juice or 6 ounces of REGULAR soda.  3. Re-check your sugar in 15 minutes. If it is still below 70, do what you did in step 2 again. If has come back up, go ahead and eat a snack or small meal at this time.

## 2020-06-22 NOTE — Progress Notes (Signed)
    S:    PCP: [name]  No chief complaint on file.  Patient arrives in good spirits.  Presents for diabetes management at the request of [name]. Patient was referred on 05/25/2020. At that visit Levemir adjusted to 30 units BID.    Patient reports diabetes was diagnosed >20 years.   Family/Social History:  - FHx: DM (mother, father) - Tobacco: never smoker - Alcohol: reports occasional use   Insurance coverage/medication affordability:  -Medicare/[place] Medicaid  Patient denies adherence with medications.  Current diabetes medications include:  - Invokana 300 mg daily (not taking)  - Levemir 30 units BID - Metformin 1000 mg BID  Patient denies hypoglycemic events.  Patient reported dietary habits:  - He does not limit carbohydrates  Patient-reported exercise habits:  - Does not exercise outside of work   Patient endorses polyuria. Denies polydipsia.  Patient denies neuropathy. Patient denies visual changes. Patient reports self foot exams.   O:  Home CBG ranges:  - Fasting: endorses range >300s - Does not have his meter today   Lab Results  Component Value Date   HGBA1C 9.8 (A) 05/25/2020   There were no vitals filed for this visit.  Lipid Panel     Component Value Date/Time   CHOL 108 10/10/2019 1007   TRIG 146 10/10/2019 1007   HDL 30 (L) 10/10/2019 1007   CHOLHDL 3.6 10/10/2019 1007   CHOLHDL 4.8 09/12/2016 0806   VLDL 29 09/12/2016 0806   LDLCALC 53 10/10/2019 1007   Clinical ASCVD: Yes - CAD, NSTEMI/CABG The ASCVD Risk score Denman George DC Jr., et al., 2013) failed to calculate for the following reasons:   The valid total cholesterol range is 130 to 320 mg/dL   A/P: Diabetes longstanding currently uncontrolled. Patient is able to verbalize appropriate hypoglycemia management plan. Patient is not taking Invokana. He tells me he is compliant with 30u BID of Levemir and metformin 1000mg  BID. Previous allergy to Victoza. His insurance will not cover Ozempic. Will  start Jardiance and Trulicity today.  -Decrease Levemir to 28 units BID. -Start Trulicity 0.75 mg weekly. Patient was educated on the use of the pen. -Start Jardiance 10 mg daily.  -Continue metformin 1000 mg BID. -Extensively discussed pathophysiology of DM, recommended lifestyle interventions, dietary effects on glycemic control -Counseled on s/sx of and management of hypoglycemia -Next A1C anticipated 08/2020.  ASCVD risk - secondary prevention in patient with DM. Last LDL is controlled. High intensity statin and ASA indicated at this time. -Continued aspirin 81 mg  -Continued atorvastatin 80 mg.   Written patient instructions provided.  Total time in face to face counseling 30 minutes.   Follow up w/ me in 1 month.   09/2020, PharmD, Butch Penny, CPP Clinical Pharmacist [facility] [phone]

## 2020-06-23 ENCOUNTER — Other Ambulatory Visit: Payer: Self-pay

## 2020-07-20 ENCOUNTER — Ambulatory Visit: Payer: Medicare Other | Attending: Nurse Practitioner | Admitting: Pharmacist

## 2020-07-20 ENCOUNTER — Other Ambulatory Visit: Payer: Self-pay

## 2020-07-20 DIAGNOSIS — E118 Type 2 diabetes mellitus with unspecified complications: Secondary | ICD-10-CM | POA: Diagnosis not present

## 2020-07-20 DIAGNOSIS — IMO0002 Reserved for concepts with insufficient information to code with codable children: Secondary | ICD-10-CM

## 2020-07-20 DIAGNOSIS — E1165 Type 2 diabetes mellitus with hyperglycemia: Secondary | ICD-10-CM

## 2020-07-20 MED ORDER — TRULICITY 1.5 MG/0.5ML ~~LOC~~ SOAJ
1.5000 mg | SUBCUTANEOUS | 2 refills | Status: DC
  Filled 2020-07-20: qty 2, 28d supply, fill #0

## 2020-07-20 MED ORDER — LEVEMIR FLEXTOUCH 100 UNIT/ML ~~LOC~~ SOPN
28.0000 [IU] | PEN_INJECTOR | Freq: Two times a day (BID) | SUBCUTANEOUS | 2 refills | Status: DC
  Filled 2020-07-20: qty 15, 27d supply, fill #0

## 2020-07-20 MED FILL — Metformin HCl Tab 1000 MG: ORAL | 90 days supply | Qty: 180 | Fill #0 | Status: AC

## 2020-07-20 NOTE — Progress Notes (Signed)
    S:    PCP: [name]  No chief complaint on file.  Patient arrives in good spirits.  Presents for diabetes management at the request of [name]. Patient was referred on 05/25/2020. I saw him on 06/22/2020 and started Trulicity along with Jardiance.   Family/Social History:  - FHx: DM (mother, father) - Tobacco: never smoker - Alcohol: reports occasional use   Insurance coverage/medication affordability:  -Medicare/[place] Medicaid  Patient reports adherence with medications.  Current diabetes medications include:  - Jardiance 25mg  daily - Levemir 28 units BID - Trulicity 0.75 mg weekly  - Metformin 1000 mg BID  Patient denies hypoglycemic events.  Patient reported dietary habits:  - He does not limit carbohydrates  Patient-reported exercise habits:  - Does not exercise outside of work   Patient endorses polyuria. Denies polydipsia.  Patient denies neuropathy. Patient denies visual changes. Patient reports self foot exams.   O:  Home CBG ranges:  - Fasting: endorses improvement. Reports that his home CBGs are 180s-220s since starting Trulicity - Does not have his meter today   Lab Results  Component Value Date   HGBA1C 9.8 (A) 05/25/2020   There were no vitals filed for this visit.  Lipid Panel     Component Value Date/Time   CHOL 108 10/10/2019 1007   TRIG 146 10/10/2019 1007   HDL 30 (L) 10/10/2019 1007   CHOLHDL 3.6 10/10/2019 1007   CHOLHDL 4.8 09/12/2016 0806   VLDL 29 09/12/2016 0806   LDLCALC 53 10/10/2019 1007   Clinical ASCVD: Yes - CAD, NSTEMI/CABG The ASCVD Risk score 10/12/2019 DC Jr., et al., 2013) failed to calculate for the following reasons:   The valid total cholesterol range is 130 to 320 mg/dL   A/P: Diabetes longstanding currently uncontrolled. Patient is able to verbalize appropriate hypoglycemia management plan. Patient is not taking Invokana. He tells me he is compliant with 30u BID of Levemir and metformin 1000mg  BID. Previous allergy to Victoza.  His insurance will not cover Ozempic. Will start Jardiance and Trulicity today.  -Continue Levemir to 28 units BID. -Inc Trulicity to 1.5 mg weekly.  -Continue Jardiance 10 mg daily.  -Continue metformin 1000 mg BID. -Extensively discussed pathophysiology of DM, recommended lifestyle interventions, dietary effects on glycemic control -Counseled on s/sx of and management of hypoglycemia -Next A1C anticipated 08/2020.  ASCVD risk - secondary prevention in patient with DM. Last LDL is controlled. High intensity statin and ASA indicated at this time. -Continued aspirin 81 mg  -Continued atorvastatin 80 mg.   Written patient instructions provided.  Total time in face to face counseling 30 minutes.   Follow up w/ [name] next month.   , PharmD, 09/2020, CPP Clinical Pharmacist [facility] [phone]

## 2020-08-16 ENCOUNTER — Encounter: Payer: Self-pay | Admitting: Nurse Practitioner

## 2020-08-24 ENCOUNTER — Other Ambulatory Visit: Payer: Self-pay

## 2020-08-24 ENCOUNTER — Ambulatory Visit: Payer: Medicare Other | Attending: Nurse Practitioner | Admitting: Nurse Practitioner

## 2020-08-24 ENCOUNTER — Encounter: Payer: Self-pay | Admitting: Nurse Practitioner

## 2020-08-24 VITALS — BP 121/71 | HR 71 | Resp 16 | Wt 136.4 lb

## 2020-08-24 DIAGNOSIS — I2510 Atherosclerotic heart disease of native coronary artery without angina pectoris: Secondary | ICD-10-CM | POA: Insufficient documentation

## 2020-08-24 DIAGNOSIS — Z951 Presence of aortocoronary bypass graft: Secondary | ICD-10-CM | POA: Insufficient documentation

## 2020-08-24 DIAGNOSIS — Z794 Long term (current) use of insulin: Secondary | ICD-10-CM | POA: Diagnosis not present

## 2020-08-24 DIAGNOSIS — Z79899 Other long term (current) drug therapy: Secondary | ICD-10-CM | POA: Diagnosis not present

## 2020-08-24 DIAGNOSIS — R21 Rash and other nonspecific skin eruption: Secondary | ICD-10-CM

## 2020-08-24 DIAGNOSIS — I25119 Atherosclerotic heart disease of native coronary artery with unspecified angina pectoris: Secondary | ICD-10-CM

## 2020-08-24 DIAGNOSIS — K130 Diseases of lips: Secondary | ICD-10-CM

## 2020-08-24 DIAGNOSIS — I272 Pulmonary hypertension, unspecified: Secondary | ICD-10-CM | POA: Diagnosis not present

## 2020-08-24 DIAGNOSIS — E1165 Type 2 diabetes mellitus with hyperglycemia: Secondary | ICD-10-CM

## 2020-08-24 DIAGNOSIS — Z7982 Long term (current) use of aspirin: Secondary | ICD-10-CM | POA: Diagnosis not present

## 2020-08-24 DIAGNOSIS — R0981 Nasal congestion: Secondary | ICD-10-CM | POA: Diagnosis not present

## 2020-08-24 DIAGNOSIS — E118 Type 2 diabetes mellitus with unspecified complications: Secondary | ICD-10-CM | POA: Diagnosis not present

## 2020-08-24 DIAGNOSIS — I252 Old myocardial infarction: Secondary | ICD-10-CM | POA: Insufficient documentation

## 2020-08-24 DIAGNOSIS — I10 Essential (primary) hypertension: Secondary | ICD-10-CM | POA: Diagnosis not present

## 2020-08-24 DIAGNOSIS — I779 Disorder of arteries and arterioles, unspecified: Secondary | ICD-10-CM

## 2020-08-24 DIAGNOSIS — K219 Gastro-esophageal reflux disease without esophagitis: Secondary | ICD-10-CM | POA: Diagnosis not present

## 2020-08-24 DIAGNOSIS — Z7984 Long term (current) use of oral hypoglycemic drugs: Secondary | ICD-10-CM | POA: Insufficient documentation

## 2020-08-24 LAB — GLUCOSE, POCT (MANUAL RESULT ENTRY): POC Glucose: 145 mg/dl — AB (ref 70–99)

## 2020-08-24 LAB — POCT GLYCOSYLATED HEMOGLOBIN (HGB A1C): HbA1c, POC (controlled diabetic range): 7.7 % — AB (ref 0.0–7.0)

## 2020-08-24 MED ORDER — ONETOUCH DELICA PLUS LANCET33G MISC
6 refills | Status: AC
  Filled 2020-08-24: qty 200, 90d supply, fill #0

## 2020-08-24 MED ORDER — LEVEMIR FLEXTOUCH 100 UNIT/ML ~~LOC~~ SOPN
28.0000 [IU] | PEN_INJECTOR | Freq: Two times a day (BID) | SUBCUTANEOUS | 1 refills | Status: DC
  Filled 2020-08-24: qty 36, 64d supply, fill #0

## 2020-08-24 MED ORDER — LOSARTAN POTASSIUM 100 MG PO TABS
ORAL_TABLET | ORAL | 3 refills | Status: DC
  Filled 2020-08-24: qty 180, 90d supply, fill #0
  Filled 2021-01-11: qty 180, 90d supply, fill #1
  Filled 2021-01-11: qty 90, 90d supply, fill #1

## 2020-08-24 MED ORDER — ASPIRIN 81 MG PO CHEW
81.0000 mg | CHEWABLE_TABLET | Freq: Every day | ORAL | 3 refills | Status: AC
Start: 2020-08-24 — End: 2020-11-22
  Filled 2020-08-24: qty 90, 90d supply, fill #0

## 2020-08-24 MED ORDER — INSULIN PEN NEEDLE 32G X 4 MM MISC
11 refills | Status: AC
Start: 2020-08-24 — End: 2021-08-24
  Filled 2020-08-24: qty 200, 90d supply, fill #0

## 2020-08-24 MED ORDER — ONETOUCH VERIO VI STRP
ORAL_STRIP | 6 refills | Status: DC
  Filled 2020-08-24: qty 200, 90d supply, fill #0

## 2020-08-24 MED ORDER — METOPROLOL TARTRATE 25 MG PO TABS
ORAL_TABLET | ORAL | 1 refills | Status: DC
  Filled 2020-08-24: qty 180, 90d supply, fill #0
  Filled 2021-01-11: qty 180, 90d supply, fill #1

## 2020-08-24 MED ORDER — AMLODIPINE BESYLATE 5 MG PO TABS
ORAL_TABLET | ORAL | 3 refills | Status: DC
  Filled 2020-08-24: qty 90, 90d supply, fill #0
  Filled 2021-01-11: qty 90, 90d supply, fill #1

## 2020-08-24 MED ORDER — TRIAMCINOLONE ACETONIDE 0.025 % EX OINT
1.0000 "application " | TOPICAL_OINTMENT | Freq: Two times a day (BID) | CUTANEOUS | 1 refills | Status: DC
  Filled 2020-08-24: qty 60, 30d supply, fill #0
  Filled 2021-02-01: qty 60, 30d supply, fill #1

## 2020-08-24 MED ORDER — ATORVASTATIN CALCIUM 80 MG PO TABS
ORAL_TABLET | ORAL | 3 refills | Status: DC
  Filled 2020-08-24: qty 90, 90d supply, fill #0

## 2020-08-24 MED ORDER — EMPAGLIFLOZIN 10 MG PO TABS
10.0000 mg | ORAL_TABLET | Freq: Every day | ORAL | 1 refills | Status: DC
  Filled 2020-08-24: qty 90, 90d supply, fill #0

## 2020-08-24 MED ORDER — PANTOPRAZOLE SODIUM 40 MG PO TBEC
DELAYED_RELEASE_TABLET | Freq: Every day | ORAL | 1 refills | Status: DC
  Filled 2020-08-24: qty 90, 90d supply, fill #0

## 2020-08-24 MED ORDER — FLUTICASONE PROPIONATE 50 MCG/ACT NA SUSP
2.0000 | Freq: Every day | NASAL | 6 refills | Status: DC
  Filled 2020-08-24: qty 16, 30d supply, fill #0
  Filled 2021-04-13: qty 32, 60d supply, fill #0

## 2020-08-24 MED ORDER — TRULICITY 1.5 MG/0.5ML ~~LOC~~ SOAJ
1.5000 mg | SUBCUTANEOUS | 3 refills | Status: DC
  Filled 2020-08-24: qty 6, 84d supply, fill #0

## 2020-08-24 MED ORDER — METFORMIN HCL 1000 MG PO TABS
ORAL_TABLET | ORAL | 1 refills | Status: DC
  Filled 2020-08-24: qty 180, fill #0
  Filled 2020-08-26: qty 180, 90d supply, fill #0

## 2020-08-24 NOTE — Patient Instructions (Addendum)
SLEEPY TIME TEA    Pls call dermatology in 2 weeks if you have not heard from them    Sent Referral to [facility] ph# [phone]   address [address].

## 2020-08-24 NOTE — Progress Notes (Signed)
Assessment & Plan:  Carl Clark was seen today for diabetes.  Diagnoses and all orders for this visit:  Essential hypertension -     amLODipine (NORVASC) 5 MG tablet; TAKE 1 TABLET (5 MG TOTAL) BY MOUTH DAILY. PLEASE FILL AS A 90 DAY SUPPLY -     metoprolol tartrate (LOPRESSOR) 25 MG tablet; TAKE 1 TABLET (25 MG TOTAL) BY MOUTH 2 (TWO) TIMES DAILY. PLEASE FILL AS A 90 DAY SUPPLY -     losartan (COZAAR) 100 MG tablet; TAKE 1 TABLET (100 MG TOTAL) BY MOUTH DAILY. PLEASE FILL AS A 90 DAY SUPPLY -     CMP14+EGFR  Diabetes mellitus type 2, uncontrolled, with complications (HCC) -     POCT glucose (manual entry) -     POCT glycosylated hemoglobin (Hb A1C) -     insulin detemir (LEVEMIR FLEXTOUCH) 100 UNIT/ML FlexPen; Inject 28 Units into the skin 2 (two) times daily. Please fill as a 90 day supply -     Dulaglutide (TRULICITY) 1.5 NB/5.6PO SOPN; Inject 1.5 mg into the skin once a week. Please fill as a 90 day supply -     empagliflozin (JARDIANCE) 10 MG TABS tablet; Take 1 tablet (10 mg total) by mouth daily before breakfast. Please fill as a 90 day supply -     metFORMIN (GLUCOPHAGE) 1000 MG tablet; TAKE 1 TABLET (1,000 MG TOTAL) BY MOUTH 2 (TWO) TIMES DAILY WITH A MEAL. PLEASE FILL AS A 90 DAY SUPPLY -     Insulin Pen Needle 32G X 4 MM MISC; USE AS INSTRUCTED. INJECT INTO THE SKIN TWICE DAILY. PATIENT REQUESTS 6 MONTH SUPPLY -     glucose blood (ONETOUCH VERIO) test strip; Use as instructed to check blood sugar 3 times weekly before breakfast. E11.9. -     Lancets (ONETOUCH DELICA PLUS [id number]) MISC; USE AS INSTRUCTED TO CHECK BLOOD SUGAR 3 TIMES WEEKLY BEFORE BREAKFAST. -     Lipid panel Continue blood sugar control as discussed in office today, low carbohydrate diet, and regular physical exercise as tolerated, 150 minutes per week (30 min each day, 5 days per week, or 50 min 3 days per week). Keep blood sugar logs with fasting goal of 90-130 mg/dl, post prandial (after you eat) less than 180.   For Hypoglycemia: BS <60 and Hyperglycemia BS >400; contact the clinic ASAP. Annual eye exams and foot exams are recommended.   Mild carotid artery disease (HCC) -     aspirin 81 MG chewable tablet; Chew 1 tablet (81 mg total) by mouth daily. Please fill as a 90 day supply -     CBC  Atherosclerosis of native coronary artery of native heart with angina pectoris (HCC) -     aspirin 81 MG chewable tablet; Chew 1 tablet (81 mg total) by mouth daily. Please fill as a 90 day supply -     atorvastatin (LIPITOR) 80 MG tablet; TAKE 1 TABLET BY MOUTH ONCE DAILY AT 6PM. INSTRUCTIONS: Work on a low fat, heart healthy diet and participate in regular aerobic exercise program by working out at least 150 minutes per week; 5 days a week-30 minutes per day. Avoid red meat/beef/steak,  fried foods. junk foods, sodas, sugary drinks, unhealthy snacking, alcohol and smoking.  Drink at least 80 oz of water per day and monitor your carbohydrate intake daily.   Rash on lips -     Cancel: Ambulatory referral to Dermatology -     Ambulatory referral to Dermatology  Nasal congestion -  fluticasone (FLONASE) 50 MCG/ACT nasal spray; Place 2 sprays into both nostrils daily.  Skin rash -     triamcinolone (KENALOG) 0.025 % ointment; Apply 1 application topically 2 (two) times daily. TO back of head  GERD without esophagitis -     pantoprazole (PROTONIX) 40 MG tablet; TAKE 1 TABLET (40 MG TOTAL) BY MOUTH DAILY. INSTRUCTIONS: Avoid GERD Triggers: acidic, spicy or fried foods, caffeine, coffee, sodas,  alcohol and chocolate.   Patient has been counseled on age-appropriate routine health concerns for screening and prevention. These are reviewed and up-to-date. Referrals have been placed accordingly. Immunizations are up-to-date or declined.    Subjective:   Chief Complaint  Patient presents with  . Diabetes   HPI Carl Clark 70 y.o. male presents to office today for follow up.  He has a past  medical history of CAD (03/16/2017), NSTEMI (09/12/2016), History of rectal bleeding, Hypertension, Type II diabetes mellitus . VRI was used to communicate directly with patient for the entire encounter including providing detailed patient instructions.   He was referred to Dermatology for chronic rash on lips but did not respond to their phone calls to schedule stating he was unsure where the office was. He is asking for prednisone today for his lips and I have denied his request. He has requested for me to place another dermatology referral.  He has been treated for the rash on his lips with both antivirals of acyclovir and valacyclovir with   Essential Hypertension Well controlled. Taking amlodipine 5 mg daily and lopressor 25 mg BID as prescribed. Denies chest pain, shortness of breath, palpitations, lightheadedness, dizziness, headaches or BLE edema.  BP Readings from Last 3 Encounters:  08/24/20 121/71  05/25/20 (!) 143/80  02/11/20 132/74   DM 2 A1c improved today. Home readings: 170-240s postprandial. Trulicty was increased to 1.5 mg weekly on 07-20-2020. He continues on levemir 28 units BID, jardiance 10 mg daily and metformin 1000 mg BID. LDL at goal with atorvastatin 38m daily. He is taking 814mASA daily. He endorses insomnia since his Trulicity was increased.  Lab Results  Component Value Date   HGBA1C 7.7 (A) 08/24/2020   Lab Results  Component Value Date   HGBA1C 9.8 (A) 05/25/2020   Lab Results  Component Value Date   LDLCALC 53 10/10/2019   Review of Systems  Constitutional: Negative for fever, malaise/fatigue and weight loss.  HENT: Negative.  Negative for nosebleeds.   Eyes: Negative.  Negative for blurred vision, double vision and photophobia.  Respiratory: Negative.  Negative for cough and shortness of breath.   Cardiovascular: Negative.  Negative for chest pain, palpitations and leg swelling.  Gastrointestinal: Positive for heartburn (controlled). Negative for  nausea and vomiting.  Musculoskeletal: Negative.  Negative for myalgias.  Skin: Positive for rash (lips).  Neurological: Negative.  Negative for dizziness, focal weakness, seizures and headaches.  Psychiatric/Behavioral: Negative for suicidal ideas. The patient has insomnia.     Past Medical History:  Diagnosis Date  . CAD (coronary artery disease) 03/16/2017   S/p NSTEMI 6/18 >> LHC: multivessel CAD >> s/p CABG // Echo 6/18: Mild LVH, EF 60-65, normal wall motion, grade 1 diastolic dysfunction, normal RV SF, PASP 36 (borderline pulmonary hypertension) // Pre-CABG Dopplers 09/13/16: Bilateral ICA 1-39  . Echocardiogram    Echo 09/2018: EF 60-65, normal wall motion, RVSP 24.9  . Family history of anesthesia complication    "never had anesthesia" (07/14/2012)  . History of non-ST elevation myocardial infarction (NSTEMI) 09/12/2016  Treated with CABG  . History of rectal bleeding    secondary to diverticulum affecting right hepatic flexure - 06/2012  . Hypertension   . Mild carotid artery disease (HCC)    a. 1-39% bilaterally preCABG in 2018.  . Type II diabetes mellitus ([place])     Past Surgical History:  Procedure Laterality Date  . COLONOSCOPY Left 07/14/2012   Procedure: COLONOSCOPY;  Surgeon: Arta Silence, MD;  Location: [facility] ENDOSCOPY;  Service: Endoscopy;  Laterality: Left;  . CORONARY ARTERY BYPASS GRAFT N/A 09/15/2016   Procedure: CORONARY ARTERY BYPASS GRAFTING (CABG) x 3, LIMA to LAD, SVG to DIAGONAL, SVG to OM2, USING LEFT MAMMARY ARTERY AND RIGHT GREATER SAPHENOUS VEIN HARVESTED ENDOSCOPICALLY;  Surgeon: Grace Isaac, MD;  Location: [place];  Service: Open Heart Surgery;  Laterality: N/A;  . ESOPHAGOGASTRODUODENOSCOPY N/A 07/13/2012   Procedure: ESOPHAGOGASTRODUODENOSCOPY (EGD);  Surgeon: Arta Silence, MD;  Location: [facility] ENDOSCOPY;  Service: Endoscopy;  Laterality: N/A;  pt in ED A8  . LEFT HEART CATH AND CORONARY ANGIOGRAPHY N/A 09/12/2016   Procedure: Left Heart Cath and  Coronary Angiography;  Surgeon: Sherren Mocha, MD;  Location: [place] CV LAB;  Service: Cardiovascular;  Laterality: N/A;  . NO PAST SURGERIES    . TEE WITHOUT CARDIOVERSION N/A 09/15/2016   Procedure: TRANSESOPHAGEAL ECHOCARDIOGRAM (TEE);  Surgeon: Grace Isaac, MD;  Location: [place];  Service: Open Heart Surgery;  Laterality: N/A;    Family History  Problem Relation Age of Onset  . Diabetes Mother   . Diabetes Father     Social History Reviewed with no changes to be made today.   Outpatient Medications Prior to Visit  Medication Sig Dispense Refill  . Blood Glucose Monitoring Suppl (ACCU-CHEK GUIDE ME) w/Device KIT 1 kit by Does not apply route 3 (three) times daily. Use to check blood sugar 3 times daily. E11.9 1 kit 0  . Blood Glucose Monitoring Suppl (ONETOUCH VERIO REFLECT) w/Device KIT 1 KIT BY DOES NOT APPLY ROUTE 3 (THREE) TIMES DAILY. USE TO CHECK BLOOD SUGAR 3 TIMES DAILY. E11.9 1 kit 0  . glucose blood test strip USE AS INSTRUCTED TO CHECK BLOOD SUGAR 3 TIMES WEEKLY BEFORE BREAKFAST. 100 strip 6  . Misc. Devices MISC Please provide patient with an insurance approved QUAD CANE 1 each 0  . nitroGLYCERIN (NITROSTAT) 0.4 MG SL tablet PLACE 1 TABLET (0.4 MG TOTAL) UNDER THE TONGUE EVERY 5 (FIVE) MINUTES AS NEEDED FOR CHEST PAIN. 90 tablet 1  . valACYclovir (VALTREX) 1000 MG tablet TAKE 2 TABLETS (2,000 MG TOTAL) BY MOUTH 2 (TWO) TIMES DAILY FOR 1 DAY. 4 tablet 1  . amLODipine (NORVASC) 5 MG tablet Take 1 tablet (5 mg total) by mouth daily. Please fill as a 90 day supply 90 tablet 3  . amLODipine (NORVASC) 5 MG tablet TAKE 1 TABLET (5 MG TOTAL) BY MOUTH DAILY. PLEASE FILL AS A 90 DAY SUPPLY 90 tablet 3  . amLODipine (NORVASC) 5 MG tablet TAKE 1 TABLET (5 MG TOTAL) BY MOUTH DAILY. 90 tablet 3  . amLODipine (NORVASC) 5 MG tablet TAKE 1 TABLET (5 MG TOTAL) BY MOUTH DAILY. PLEASE FILL AS A 90 DAY SUPPLY 90 tablet 1  . aspirin 81 MG chewable tablet Chew 1 tablet (81 mg total) by  mouth daily. 90 tablet 3  . aspirin 81 MG EC tablet CHEW 1 TABLET (81 MG TOTAL) BY MOUTH DAILY. 90 tablet 3  . atorvastatin (LIPITOR) 80 MG tablet TAKE 1 TABLET BY MOUTH ONCE DAILY  AT  6PM. Please fill as a 90 day supply 90 tablet 3  . atorvastatin (LIPITOR) 80 MG tablet TAKE 1 TABLET BY MOUTH ONCE DAILY AT 6PM. 90 tablet 3  . atorvastatin (LIPITOR) 80 MG tablet TAKE 1 TABLET BY MOUTH ONCE DAILY AT 6PM. PLEASE FILL AS A 90 DAY SUPPLY 90 tablet 3  . Dulaglutide (TRULICITY) 1.5 ON/6.2XB SOPN Inject 1.5 mg into the skin once a week. 2 mL 2  . empagliflozin (JARDIANCE) 10 MG TABS tablet Take 1 tablet (10 mg total) by mouth daily before breakfast. 30 tablet 2  . fluticasone (FLONASE) 50 MCG/ACT nasal spray Place 2 sprays into both nostrils daily. 16 g 6  . glucose blood (ONETOUCH VERIO) test strip Use as instructed to check blood sugar 3 times weekly before breakfast. E11.9. 100 each 6  . glucose blood test strip USE AS INSTRUCTED TO CHECK BLOOD SUGAR 3 TIMES WEEKLY BEFORE BREAKFAST. E11.9. 100 strip 6  . insulin detemir (LEVEMIR FLEXTOUCH) 100 UNIT/ML FlexPen Inject 28 Units into the skin 2 (two) times daily. 15 mL 2  . Insulin Pen Needle (TRUEPLUS PEN NEEDLES) 32G X 4 MM MISC Use as instructed. Inject into the skin twice daily. Patient requests 6 month supply 200 each 11  . Insulin Pen Needle 32G X 4 MM MISC USE AS INSTRUCTED. INJECT INTO THE SKIN TWICE DAILY. PATIENT REQUESTS 6 MONTH SUPPLY 200 each 11  . Lancets (ONETOUCH DELICA PLUS [id number]) MISC USE AS INSTRUCTED TO CHECK BLOOD SUGAR 3 TIMES WEEKLY BEFORE BREAKFAST. E11.9. 100 each 6  . Lancets (ONETOUCH DELICA PLUS [id number]) MISC USE AS INSTRUCTED TO CHECK BLOOD SUGAR 3 TIMES WEEKLY BEFORE BREAKFAST. 100 each 6  . losartan (COZAAR) 100 MG tablet Take 1 tablet (100 mg total) by mouth daily. Please fill as a 90 day supply 180 tablet 3  . losartan (COZAAR) 100 MG tablet TAKE 1 TABLET (100 MG TOTAL) BY MOUTH DAILY. PLEASE FILL AS A 90 DAY SUPPLY 180  tablet 3  . losartan (COZAAR) 100 MG tablet TAKE 1 TABLET (100 MG TOTAL) BY MOUTH DAILY. 180 tablet 3  . metFORMIN (GLUCOPHAGE) 1000 MG tablet Take 1 tablet (1,000 mg total) by mouth 2 (two) times daily with a meal. Please fill as a 90 day supply 180 tablet 1  . metFORMIN (GLUCOPHAGE) 1000 MG tablet TAKE 1 TABLET (1,000 MG TOTAL) BY MOUTH 2 (TWO) TIMES DAILY WITH A MEAL. PLEASE FILL AS A 90 DAY SUPPLY 180 tablet 1  . metFORMIN (GLUCOPHAGE) 1000 MG tablet TAKE 1 TABLET (1,000 MG TOTAL) BY MOUTH 2 (TWO) TIMES DAILY WITH A MEAL. PLEASE FILL AS A 90 DAY SUPPLY 180 tablet 1  . metoprolol tartrate (LOPRESSOR) 25 MG tablet Take 1 tablet (25 mg total) by mouth 2 (two) times daily. Please fill as a 90 day supply 180 tablet 1  . metoprolol tartrate (LOPRESSOR) 25 MG tablet TAKE 1 TABLET (25 MG TOTAL) BY MOUTH 2 (TWO) TIMES DAILY. PLEASE FILL AS A 90 DAY SUPPLY 180 tablet 1  . metoprolol tartrate (LOPRESSOR) 25 MG tablet TAKE 1 TABLET (25 MG TOTAL) BY MOUTH 2 (TWO) TIMES DAILY. 180 tablet 1  . metoprolol tartrate (LOPRESSOR) 25 MG tablet TAKE 1 TABLET (25 MG TOTAL) BY MOUTH 2 (TWO) TIMES DAILY. PLEASE FILL AS A 90 DAY SUPPLY 180 tablet 1  . nitroGLYCERIN (NITROSTAT) 0.4 MG SL tablet Place 1 tablet (0.4 mg total) under the tongue every 5 (five) minutes as needed for chest pain. 90 tablet 1  .  OneTouch Delica Lancets 16X MISC Use as instructed to check blood sugar 3 times weekly before breakfast. E11.9. 100 each 6  . pantoprazole (PROTONIX) 40 MG tablet Take 1 tablet (40 mg total) by mouth daily. 30 tablet 11  . pantoprazole (PROTONIX) 40 MG tablet TAKE 1 TABLET (40 MG TOTAL) BY MOUTH DAILY. 30 tablet 11   No facility-administered medications prior to visit.    Allergies  Allergen Reactions  . Victoza [Liraglutide] Swelling    Lip swelling, itching, rash       Objective:    BP 121/71   Pulse 71   Resp 16   Wt 136 lb 6.4 oz (61.9 kg)   SpO2 99%   BMI 22.70 kg/m  Wt Readings from Last 3 Encounters:   08/24/20 136 lb 6.4 oz (61.9 kg)  05/25/20 138 lb 6.4 oz (62.8 kg)  02/11/20 137 lb 6.4 oz (62.3 kg)    Physical Exam Vitals and nursing note reviewed.  Constitutional:      Appearance: He is well-developed.  HENT:     Head: Normocephalic and atraumatic.   Eyes:   Cardiovascular:     Rate and Rhythm: Normal rate and regular rhythm.     Heart sounds: Normal heart sounds. No murmur heard. No friction rub. No gallop.   Pulmonary:     Effort: Pulmonary effort is normal. No tachypnea or respiratory distress.     Breath sounds: Normal breath sounds. No decreased breath sounds, wheezing, rhonchi or rales.  Chest:     Chest wall: No tenderness.  Abdominal:     General: Bowel sounds are normal.     Palpations: Abdomen is soft.  Musculoskeletal:        General: Normal range of motion.     Cervical back: Normal range of motion.  Skin:    General: Skin is warm and dry.     Findings: Rash present.  Neurological:     Mental Status: He is alert and oriented to person, place, and time.     Coordination: Coordination normal.  Psychiatric:        Behavior: Behavior normal. Behavior is cooperative.        Thought Content: Thought content normal.        Judgment: Judgment normal.          Patient has been counseled extensively about nutrition and exercise as well as the importance of adherence with medications and regular follow-up. The patient was given clear instructions to go to ER or return to medical center if symptoms don't improve, worsen or new problems develop. The patient verbalized understanding.   Follow-up: Return in about 3 months (around 11/24/2020).   Gildardo Pounds, FNP-BC [facility] [place], [place]   08/24/2020, 3:57 PM

## 2020-08-25 LAB — CMP14+EGFR
ALT: 12 IU/L (ref 0–44)
AST: 12 IU/L (ref 0–40)
Albumin/Globulin Ratio: 1 — ABNORMAL LOW (ref 1.2–2.2)
Albumin: 4.1 g/dL (ref 3.8–4.8)
Alkaline Phosphatase: 68 IU/L (ref 44–121)
BUN/Creatinine Ratio: 18 (ref 10–24)
BUN: 19 mg/dL (ref 8–27)
Bilirubin Total: 0.4 mg/dL (ref 0.0–1.2)
CO2: 23 mmol/L (ref 20–29)
Calcium: 9.2 mg/dL (ref 8.6–10.2)
Chloride: 101 mmol/L (ref 96–106)
Creatinine, Ser: 1.05 mg/dL (ref 0.76–1.27)
Globulin, Total: 4.2 g/dL (ref 1.5–4.5)
Glucose: 129 mg/dL — ABNORMAL HIGH (ref 65–99)
Potassium: 4.1 mmol/L (ref 3.5–5.2)
Sodium: 137 mmol/L (ref 134–144)
Total Protein: 8.3 g/dL (ref 6.0–8.5)
eGFR: 77 mL/min/{1.73_m2} (ref >59)

## 2020-08-25 LAB — LIPID PANEL
Chol/HDL Ratio: 3.2 ratio (ref 0.0–5.0)
Cholesterol, Total: 104 mg/dL (ref 100–199)
HDL: 33 mg/dL — ABNORMAL LOW (ref >39)
LDL Chol Calc (NIH): 46 mg/dL (ref 0–99)
Triglycerides: 146 mg/dL (ref 0–149)
VLDL Cholesterol Cal: 25 mg/dL (ref 5–40)

## 2020-08-25 LAB — CBC
Hematocrit: 39.6 % (ref 37.5–51.0)
Hemoglobin: 13 g/dL (ref 13.0–17.7)
MCH: 30.9 pg (ref 26.6–33.0)
MCHC: 32.8 g/dL (ref 31.5–35.7)
MCV: 94 fL (ref 79–97)
Platelets: 314 10*3/uL (ref 150–450)
RBC: 4.21 x10E6/uL (ref 4.14–5.80)
RDW: 12.6 % (ref 11.6–15.4)
WBC: 3.9 10*3/uL (ref 3.4–10.8)

## 2020-08-26 ENCOUNTER — Other Ambulatory Visit: Payer: Self-pay

## 2020-08-27 ENCOUNTER — Other Ambulatory Visit: Payer: Self-pay

## 2020-09-25 DIAGNOSIS — Z20822 Contact with and (suspected) exposure to covid-19: Secondary | ICD-10-CM | POA: Diagnosis not present

## 2020-10-28 ENCOUNTER — Encounter (HOSPITAL_COMMUNITY): Payer: Self-pay

## 2020-10-28 ENCOUNTER — Emergency Department (HOSPITAL_COMMUNITY)
Admission: EM | Admit: 2020-10-28 | Discharge: 2020-10-28 | Disposition: A | Discharge status: Home / Self Care | Payer: Medicare Other | Attending: Emergency Medicine | Admitting: Emergency Medicine

## 2020-10-28 ENCOUNTER — Emergency Department (HOSPITAL_COMMUNITY): Payer: Medicare Other

## 2020-10-28 DIAGNOSIS — Z7984 Long term (current) use of oral hypoglycemic drugs: Secondary | ICD-10-CM | POA: Insufficient documentation

## 2020-10-28 DIAGNOSIS — I10 Essential (primary) hypertension: Secondary | ICD-10-CM | POA: Diagnosis not present

## 2020-10-28 DIAGNOSIS — K529 Noninfective gastroenteritis and colitis, unspecified: Secondary | ICD-10-CM | POA: Diagnosis not present

## 2020-10-28 DIAGNOSIS — E119 Type 2 diabetes mellitus without complications: Secondary | ICD-10-CM | POA: Diagnosis not present

## 2020-10-28 DIAGNOSIS — Z951 Presence of aortocoronary bypass graft: Secondary | ICD-10-CM | POA: Insufficient documentation

## 2020-10-28 DIAGNOSIS — I25119 Atherosclerotic heart disease of native coronary artery with unspecified angina pectoris: Secondary | ICD-10-CM | POA: Diagnosis not present

## 2020-10-28 DIAGNOSIS — D649 Anemia, unspecified: Secondary | ICD-10-CM | POA: Insufficient documentation

## 2020-10-28 DIAGNOSIS — Z794 Long term (current) use of insulin: Secondary | ICD-10-CM | POA: Insufficient documentation

## 2020-10-28 DIAGNOSIS — R109 Unspecified abdominal pain: Secondary | ICD-10-CM | POA: Diagnosis not present

## 2020-10-28 DIAGNOSIS — Z79899 Other long term (current) drug therapy: Secondary | ICD-10-CM | POA: Insufficient documentation

## 2020-10-28 DIAGNOSIS — N3289 Other specified disorders of bladder: Secondary | ICD-10-CM | POA: Diagnosis not present

## 2020-10-28 DIAGNOSIS — Z7982 Long term (current) use of aspirin: Secondary | ICD-10-CM | POA: Diagnosis not present

## 2020-10-28 DIAGNOSIS — E876 Hypokalemia: Secondary | ICD-10-CM | POA: Diagnosis not present

## 2020-10-28 DIAGNOSIS — R197 Diarrhea, unspecified: Secondary | ICD-10-CM | POA: Diagnosis not present

## 2020-10-28 LAB — COMPREHENSIVE METABOLIC PANEL
ALT: 10 U/L (ref 0–44)
AST: 15 U/L (ref 15–41)
Albumin: 2.7 g/dL — ABNORMAL LOW (ref 3.5–5.0)
Alkaline Phosphatase: 54 U/L (ref 38–126)
Anion gap: 8 (ref 5–15)
BUN: 9 mg/dL (ref 8–23)
CO2: 24 mmol/L (ref 22–32)
Calcium: 8.2 mg/dL — ABNORMAL LOW (ref 8.9–10.3)
Chloride: 104 mmol/L (ref 98–111)
Creatinine, Ser: 1.19 mg/dL (ref 0.61–1.24)
GFR, Estimated: >60 mL/min (ref >60)
Glucose, Bld: 118 mg/dL — ABNORMAL HIGH (ref 70–99)
Potassium: 3.3 mmol/L — ABNORMAL LOW (ref 3.5–5.1)
Sodium: 136 mmol/L (ref 135–145)
Total Bilirubin: 0.7 mg/dL (ref 0.3–1.2)
Total Protein: 7.3 g/dL (ref 6.5–8.1)

## 2020-10-28 LAB — TYPE AND SCREEN
ABO/RH(D): O POS
Antibody Screen: NEGATIVE

## 2020-10-28 LAB — POC OCCULT BLOOD, ED: Fecal Occult Bld: NEGATIVE

## 2020-10-28 LAB — CBC
HCT: 37.2 % — ABNORMAL LOW (ref 39.0–52.0)
Hemoglobin: 12.1 g/dL — ABNORMAL LOW (ref 13.0–17.0)
MCH: 31.6 pg (ref 26.0–34.0)
MCHC: 32.5 g/dL (ref 30.0–36.0)
MCV: 97.1 fL (ref 80.0–100.0)
Platelets: 330 10*3/uL (ref 150–400)
RBC: 3.83 MIL/uL — ABNORMAL LOW (ref 4.22–5.81)
RDW: 12.4 % (ref 11.5–15.5)
WBC: 5 10*3/uL (ref 4.0–10.5)
nRBC: 0 % (ref 0.0–0.2)

## 2020-10-28 LAB — CBG MONITORING, ED: Glucose-Capillary: 106 mg/dL — ABNORMAL HIGH (ref 70–99)

## 2020-10-28 MED ORDER — METRONIDAZOLE 500 MG PO TABS
500.0000 mg | ORAL_TABLET | Freq: Once | ORAL | Status: AC
  Administered 2020-10-28: 500 mg via ORAL
  Filled 2020-10-28: qty 1

## 2020-10-28 MED ORDER — IOHEXOL 350 MG/ML SOLN
100.0000 mL | Freq: Once | INTRAVENOUS | Status: AC | PRN
  Administered 2020-10-28: 100 mL via INTRAVENOUS

## 2020-10-28 MED ORDER — LACTATED RINGERS IV BOLUS
1000.0000 mL | Freq: Once | INTRAVENOUS | Status: AC
  Administered 2020-10-28: 1000 mL via INTRAVENOUS

## 2020-10-28 MED ORDER — PROBIOTIC (LACTOBACILLUS) PO CAPS: 1.0000 | ORAL_CAPSULE | Freq: Every day | ORAL | 0 refills | Status: DC

## 2020-10-28 MED ORDER — CIPROFLOXACIN HCL 500 MG PO TABS
500.0000 mg | ORAL_TABLET | Freq: Two times a day (BID) | ORAL | 0 refills | Status: DC
  Filled 2020-10-29: qty 14, 7d supply, fill #0

## 2020-10-28 MED ORDER — CIPROFLOXACIN HCL 500 MG PO TABS
500.0000 mg | ORAL_TABLET | Freq: Once | ORAL | Status: AC
  Administered 2020-10-28: 500 mg via ORAL
  Filled 2020-10-28: qty 1

## 2020-10-28 MED ORDER — METRONIDAZOLE 500 MG PO TABS
500.0000 mg | ORAL_TABLET | Freq: Two times a day (BID) | ORAL | 0 refills | Status: DC
  Filled 2020-10-29: qty 14, 7d supply, fill #0

## 2020-10-28 NOTE — ED Notes (Signed)
Pt returned. Stated "I went to eat because I have diabetes". Pt is now saying he wants to be seen.

## 2020-10-28 NOTE — ED Provider Notes (Signed)
Pt seen in conjunction with C Prosperi, PA-C. Please see his notes for full history, exam, and plan   In brief, patient presenting for evaluation of intermittent nausea, vomiting abdominal pain and abnormal stools.  He has been having the symptoms for about a month and a half.  He is not having any pain currently.  He reports subjective fevers.  He has been in [place] recently.  Labs overall reassuring.  Abdominal exam benign.  Considering patient's age and persistence of symptoms, CT ordered.  Otherwise will plan to treat symptomatically.     Alveria Apley, PA-C 10/28/20 [id number]    Benjiman Core, MD 10/28/20 [id number]

## 2020-10-28 NOTE — ED Triage Notes (Signed)
Pt states that he has been having diarrhea and abd pain for the past few days since getting off a plane, he has been in [place] for the past few months and just came to [place], as well as blood in his stool, bright red. Fevers as well

## 2020-10-28 NOTE — ED Notes (Signed)
Pt verbalizes understanding of discharge instructions. Opportunity for questions and answers were provided. Pt discharged from the ED.   ?

## 2020-10-28 NOTE — ED Provider Notes (Signed)
[facility] EMERGENCY DEPARTMENT Provider Note   CSN: [id number] Arrival date & time: 10/28/20  0533     History Chief Complaint  Patient presents with   Abdominal Pain   GI Bleeding    Carl Clark is a 70 y.o. male with a past medical history of HTN, DM2, and CAD requiring bypass surgery who presents to the ED today after returning from a months long visit to his home in [place] and complains of intermittent vomiting, diarrhea, and abdominal pain over 1.5 months. Patient is unable to localize a specific locus of pain, describing it as general across the whole abdomen. Patient reports episodes of vomiting and diarrhea last for 3-4 days at a time. Patient does not associate any particular triggers for the events, and has not had major dietary changes in this time period. Patient reports pain coming often 1 hour after eating when he is episodic and this is followed by vomiting and diarrhea. Patient is not nauseous and has not vomited today but sought evaluation due to the ongoing and worrisome nature of this problem. Patient describes some darkened / reddish stool changes without BRBPR. Patient denies family history of cancer, reports an allergy to victoza, and is compliant with his medications.   Abdominal Pain Associated symptoms: diarrhea, fever, nausea and vomiting   Associated symptoms: no chest pain, no dysuria, no shortness of breath and no sore throat       Past Medical History:  Diagnosis Date   CAD (coronary artery disease) 03/16/2017   S/p NSTEMI 6/18 >> LHC: multivessel CAD >> s/p CABG // Echo 6/18: Mild LVH, EF 60-65, normal wall motion, grade 1 diastolic dysfunction, normal RV SF, PASP 36 (borderline pulmonary hypertension) // Pre-CABG Dopplers 09/13/16: Bilateral ICA 1-39   Echocardiogram    Echo 09/2018: EF 60-65, normal wall motion, RVSP 24.9   Family history of anesthesia complication    "never had anesthesia" (07/14/2012)   History of non-ST  elevation myocardial infarction (NSTEMI) 09/12/2016   Treated with CABG   History of rectal bleeding    secondary to diverticulum affecting right hepatic flexure - 06/2012   Hypertension    Mild carotid artery disease (HCC)    a. 1-39% bilaterally preCABG in 2018.   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   History of non-ST elevation myocardial infarction (NSTEMI) 09/12/2016   Rectal bleeding 07/13/2012   Diabetes mellitus ([place]) 07/13/2012   Essential hypertension 07/13/2012    Past Surgical History:  Procedure Laterality Date   COLONOSCOPY Left 07/14/2012   Procedure: COLONOSCOPY;  Surgeon: Arta Silence, MD;  Location: [facility] ENDOSCOPY;  Service: Endoscopy;  Laterality: Left;   CORONARY ARTERY BYPASS GRAFT N/A 09/15/2016   Procedure: CORONARY ARTERY BYPASS GRAFTING (CABG) x 3, LIMA to LAD, SVG to DIAGONAL, SVG to OM2, USING LEFT MAMMARY ARTERY AND RIGHT GREATER SAPHENOUS VEIN HARVESTED ENDOSCOPICALLY;  Surgeon: Grace Isaac, MD;  Location: [facility];  Service: Open Heart Surgery;  Laterality: N/A;   ESOPHAGOGASTRODUODENOSCOPY N/A 07/13/2012   Procedure: ESOPHAGOGASTRODUODENOSCOPY (EGD);  Surgeon: Arta Silence, MD;  Location: [facility] ENDOSCOPY;  Service: Endoscopy;  Laterality: N/A;  pt  in ED A8   LEFT HEART CATH AND CORONARY ANGIOGRAPHY N/A 09/12/2016   Procedure: Left Heart Cath and Coronary Angiography;  Surgeon: Sherren Mocha, MD;  Location: [facility] LAB;  Service: Cardiovascular;  Laterality: N/A;   NO PAST SURGERIES     TEE WITHOUT CARDIOVERSION N/A 09/15/2016   Procedure: TRANSESOPHAGEAL ECHOCARDIOGRAM (TEE);  Surgeon:  Grace Isaac, MD;  Location: [facility];  Service: Open Heart Surgery;  Laterality: N/A;       Family History  Problem Relation Age of Onset   Diabetes Mother    Diabetes Father     Social History   Tobacco Use   Smoking status: Never   Smokeless tobacco: Never  Vaping Use   Vaping Use: Never used  Substance Use Topics   Alcohol use: No   Drug use: No    Home Medications Prior to Admission medications   Medication Sig Start Date End Date Taking? Authorizing Provider  amLODipine (NORVASC) 5 MG tablet TAKE 1 TABLET (5 MG TOTAL) BY MOUTH DAILY. PLEASE FILL AS A 90 DAY SUPPLY 08/24/20 08/24/21 Yes Gildardo Pounds, NP  aspirin 81 MG chewable tablet Chew 1 tablet (81 mg total) by mouth daily. Please fill as a 90 day supply 08/24/20 11/22/20 Yes Gildardo Pounds, NP  atorvastatin (LIPITOR) 80 MG tablet TAKE 1 TABLET BY MOUTH ONCE DAILY AT 6PM. 08/24/20 11/22/20 Yes Gildardo Pounds, NP  ciprofloxacin (CIPRO) 500 MG tablet Take 1 tablet (500 mg total) by mouth 2 (two) times daily. 10/28/20  Yes Osie Amparo H, PA-C  Dulaglutide (TRULICITY) 1.5 MW/1.0UV SOPN Inject 1.5 mg into the skin once a week. Please fill as a 90 day supply 08/24/20 11/22/20 Yes Gildardo Pounds, NP  empagliflozin (JARDIANCE) 10 MG TABS tablet Take 1 tablet (10 mg total) by mouth daily before breakfast. Please fill as a 90 day supply 08/24/20 11/22/20 Yes Gildardo Pounds, NP  fluticasone (FLONASE) 50 MCG/ACT nasal spray Place 2 sprays into both nostrils daily. 08/24/20  Yes Gildardo Pounds, NP  insulin detemir (LEVEMIR FLEXTOUCH) 100 UNIT/ML FlexPen Inject 28 Units into the skin 2 (two) times daily. Please fill as a 90 day supply 08/24/20 11/22/20 Yes Gildardo Pounds, NP  losartan (COZAAR) 100 MG tablet TAKE 1 TABLET (100 MG TOTAL) BY MOUTH DAILY. PLEASE FILL AS A 90 DAY SUPPLY 08/24/20 08/24/21 Yes Gildardo Pounds, NP  metFORMIN (GLUCOPHAGE) 1000 MG tablet TAKE 1 TABLET (1,000 MG TOTAL) BY MOUTH 2 (TWO) TIMES DAILY WITH A MEAL. PLEASE  FILL AS A 90 DAY SUPPLY 08/24/20 08/24/21 Yes Gildardo Pounds, NP  metoprolol tartrate (LOPRESSOR) 25 MG tablet TAKE 1 TABLET (25 MG TOTAL) BY MOUTH 2 (TWO) TIMES DAILY. PLEASE FILL AS A 90 DAY SUPPLY 08/24/20 08/24/21 Yes Gildardo Pounds, NP  metroNIDAZOLE (FLAGYL) 500 MG tablet Take 1 tablet (500 mg total) by mouth 2 (two) times daily. 10/28/20  Yes Naftoli Penny H, PA-C  nitroGLYCERIN (NITROSTAT) 0.4 MG SL tablet PLACE 1 TABLET (0.4 MG TOTAL) UNDER THE TONGUE EVERY 5 (FIVE) MINUTES AS NEEDED FOR CHEST PAIN. 02/11/20 02/10/21 Yes Bhagat, Bhavinkumar, PA  pantoprazole (PROTONIX) 40 MG tablet TAKE 1 TABLET (40 MG TOTAL) BY MOUTH DAILY. 08/24/20 11/22/20 Yes Gildardo Pounds, NP  Probiotic, Lactobacillus, CAPS Take 1 tablet by mouth daily. 10/28/20  Yes Veena Sturgess H, PA-C  triamcinolone (KENALOG) 0.025 % ointment Apply 1 application topically 2 (two) times daily. TO back of head 08/24/20  Yes Gildardo Pounds,  NP  Blood Glucose Monitoring Suppl (ACCU-CHEK GUIDE ME) w/Device KIT 1 kit by Does not apply route 3 (three) times daily. Use to check blood sugar 3 times daily. E11.9 01/06/20   Gildardo Pounds, NP  Blood Glucose Monitoring Suppl (ONETOUCH VERIO REFLECT) w/Device KIT 1 KIT BY DOES NOT APPLY ROUTE 3 (THREE) TIMES DAILY. USE TO CHECK BLOOD SUGAR 3 TIMES DAILY. E11.9 01/06/20 01/05/21  Gildardo Pounds, NP  glucose blood (ONETOUCH VERIO) test strip Use as instructed to check blood sugar 3 times weekly before breakfast. E11.9. 08/24/20   Gildardo Pounds, NP  glucose blood test strip USE AS INSTRUCTED TO CHECK BLOOD SUGAR 3 TIMES WEEKLY BEFORE BREAKFAST. 01/09/20 01/08/21  Charlott Rakes, MD  Insulin Pen Needle 32G X 4 MM MISC USE AS INSTRUCTED. INJECT INTO THE SKIN TWICE DAILY. PATIENT REQUESTS 6 MONTH SUPPLY 08/24/20 08/24/21  Gildardo Pounds, NP  Lancets (ONETOUCH DELICA PLUS [id number]) MISC USE AS INSTRUCTED TO CHECK BLOOD SUGAR 3 TIMES WEEKLY BEFORE BREAKFAST. 08/24/20 11/22/20  Gildardo Pounds, NP   Misc. Devices MISC Please provide patient with an insurance approved QUAD CANE 08/10/17   Gildardo Pounds, NP  valACYclovir (VALTREX) 1000 MG tablet TAKE 2 TABLETS (2,000 MG TOTAL) BY MOUTH 2 (TWO) TIMES DAILY FOR 1 DAY. Patient not taking: Reported on 10/28/2020 05/25/20 05/25/21  Gildardo Pounds, NP    Allergies    Victoza [liraglutide]  Review of Systems   Review of Systems  Constitutional:  Positive for appetite change and fever. Negative for activity change.  HENT:  Negative for rhinorrhea, sore throat and trouble swallowing.   Respiratory:  Negative for chest tightness and shortness of breath.   Cardiovascular:  Negative for chest pain, palpitations and leg swelling.  Gastrointestinal:  Positive for abdominal pain, diarrhea, nausea and vomiting.  Genitourinary:  Negative for difficulty urinating and dysuria.  Allergic/Immunologic: Negative for food allergies.  Neurological:  Negative for light-headedness.   Physical Exam Updated Vital Signs BP 120/68   Pulse 88   Temp 98.6 F (37 C) (Oral)   Resp 18   SpO2 98%   Physical Exam Vitals and nursing note reviewed.  Constitutional:      Appearance: He is normal weight.  HENT:     Head: Normocephalic and atraumatic.     Mouth/Throat:     Pharynx: Oropharynx is clear. No oropharyngeal exudate.  Eyes:     Extraocular Movements: Extraocular movements intact.     Pupils: Pupils are equal, round, and reactive to light.  Cardiovascular:     Rate and Rhythm: Normal rate and regular rhythm.     Heart sounds: No murmur heard.   No friction rub. No gallop.  Pulmonary:     Effort: Pulmonary effort is normal. No respiratory distress.     Breath sounds: Normal breath sounds. No wheezing or rales.  Abdominal:     General: Abdomen is flat. Bowel sounds are normal. There is no distension or abdominal bruit. There are no signs of injury.     Palpations: Abdomen is soft. There is no hepatomegaly, splenomegaly or mass.     Comments:  Diffusely tender across entire abdomen, patient unable to localize any particular area of point tenderness  Genitourinary:    Prostate: Normal.     Rectum: Normal.  Skin:    General: Skin is warm and dry.  Neurological:     General: No focal deficit present.     Mental Status: He is alert and oriented  to person, place, and time.  Psychiatric:        Mood and Affect: Mood normal.        Behavior: Behavior normal.    ED Results / Procedures / Treatments   Labs (all labs ordered are listed, but only abnormal results are displayed) Labs Reviewed  COMPREHENSIVE METABOLIC PANEL - Abnormal; Notable for the following components:      Result Value   Potassium 3.3 (*)    Glucose, Bld 118 (*)    Calcium 8.2 (*)    Albumin 2.7 (*)    All other components within normal limits  CBC - Abnormal; Notable for the following components:   RBC 3.83 (*)    Hemoglobin 12.1 (*)    HCT 37.2 (*)    All other components within normal limits  CBG MONITORING, ED - Abnormal; Notable for the following components:   Glucose-Capillary 106 (*)    All other components within normal limits  POC OCCULT BLOOD, ED  CBG MONITORING, ED  TYPE AND SCREEN    EKG None  Radiology CT ABDOMEN PELVIS W CONTRAST  Addendum Date: 10/28/2020   ADDENDUM REPORT: 10/28/2020 19:13 ADDENDUM: Slightly thick-walled appearance of urinary bladder with mild stranding suggestive of cystitis. Correlate with urinalysis Electronically Signed   By: Donavan Foil M.D.   On: 10/28/2020 19:13   Result Date: 10/28/2020 CLINICAL DATA:  Abdomen pain with fever EXAM: CT ABDOMEN AND PELVIS WITH CONTRAST TECHNIQUE: Multidetector CT imaging of the abdomen and pelvis was performed using the standard protocol following bolus administration of intravenous contrast. CONTRAST:  115m OMNIPAQUE IOHEXOL 350 MG/ML SOLN COMPARISON:  CT 04/04/2017 FINDINGS: Lower chest: No acute abnormality. Stable 4 mm right middle lobe nodule. Hepatobiliary: No focal  liver abnormality is seen. No gallstones, gallbladder wall thickening, or biliary dilatation. Subcapsular right hepatic lobe calcification as before. Pancreas: Unremarkable. No pancreatic ductal dilatation or surrounding inflammatory changes. Spleen: Normal in size without focal abnormality. Adrenals/Urinary Tract: Adrenal glands are unremarkable. Kidneys are normal, without renal calculi, focal lesion, or hydronephrosis. Bladder is thick walled with slight inflammatory change. Stomach/Bowel: Stomach nonenlarged. No dilated small bowel. Suspicion of bowel wall thickening involving left upper quadrant jejunal small bowel loops. Diffuse wall thickening of the colon. Negative appendix Vascular/Lymphatic: Nonaneurysmal aorta.  No suspicious nodes Reproductive: Status post hysterectomy. No adnexal masses. Other: Negative for free air or free fluid. Musculoskeletal: No acute or significant osseous findings. IMPRESSION: 1. Probable mild wall thickening of jejunal small bowel loops with diffuse wall thickening of the colon, suspicious for enterocolitis. 2. Other stable findings as previously described Electronically Signed: By: KDonavan FoilM.D. On: 10/28/2020 18:39    Procedures Procedures   Medications Ordered in ED Medications  lactated ringers bolus 1,000 mL (0 mLs Intravenous Stopped 10/28/20 1758)  iohexol (OMNIPAQUE) 350 MG/ML injection 100 mL (100 mLs Intravenous Contrast Given 10/28/20 1817)  metroNIDAZOLE (FLAGYL) tablet 500 mg (500 mg Oral Given 10/28/20 2002)  ciprofloxacin (CIPRO) tablet 500 mg (500 mg Oral Given 10/28/20 2002)    ED Course  I have reviewed the triage vital signs and the nursing notes.  Pertinent labs & imaging results that were available during my care of the patient were reviewed by me and considered in my medical decision making (see chart for details).  Clinical Course as of 10/28/20 2054  Wed Oct 28, 2020  1525 POC occult blood, ED [CP]  145CT ABDOMEN PELVIS W CONTRAST  [CP]  18185CT ABDOMEN PELVIS W CONTRAST [CP]  Clinical Course User Index [CP] Rangel Echeverri, Joesph Fillers, PA-C   MDM Rules/Calculators/A&P                         Ongoing nausea, vomiting, and diarrhea over the course of 1.5 months. Duration inconsistent with simple gastroenteritis. Abdominal exam notable for no point tenderness at any location, only a diffuse tenderness across the abdomen. Reassuring for no acute abdomen. Given duration and clinical picture of dehydration in the setting of fluid loss, repleted fluids with 1L bolus of LR. With risk factors of age, duration, and description of bloody stool despite negative occult blood test in office today, CT abdomen and pelvis ordered at this time. Results of CT showed "Probable mild wall thickening of jejunal small bowel loops with diffuse wall thickening of the colon, suspicious for enterocolitis". Based on CT findings and patient presentation, prescribed oral cipro / flagyl as well as a probiotic. One dose of cipro / flagyl given orally prior to discharge. CMP and CBC largely benign and remarkable for mild anemia as well as slight hypokalemia consistent with picture of protracted nausea and vomiting. LR should help to correct potassium deficit. Patient educated about findings of CT today and agrees with plan for antibiotics and to follow up with his primary care doctor, and with GI should nausea vomiting and diarrhea not improve.  Final Clinical Impression(s) / ED Diagnoses Final diagnoses:  Colitis  Diarrhea, unspecified type  Intermittent abdominal pain    Rx / DC Orders ED Discharge Orders          Ordered    ciprofloxacin (CIPRO) 500 MG tablet  2 times daily        10/28/20 1928    metroNIDAZOLE (FLAGYL) 500 MG tablet  2 times daily        10/28/20 1928    Probiotic, Lactobacillus, CAPS  Daily        10/28/20 1928             Dorien Chihuahua 10/28/20 2054    Davonna Belling, MD 10/28/20 2322

## 2020-10-28 NOTE — ED Notes (Signed)
Pt leaving due to long wait.

## 2020-10-29 ENCOUNTER — Other Ambulatory Visit: Payer: Self-pay

## 2020-11-27 ENCOUNTER — Ambulatory Visit: Payer: Medicare Other | Admitting: Nurse Practitioner

## 2020-11-27 ENCOUNTER — Encounter (HOSPITAL_COMMUNITY): Payer: Self-pay | Admitting: Emergency Medicine

## 2020-11-27 ENCOUNTER — Other Ambulatory Visit: Payer: Self-pay

## 2020-11-27 ENCOUNTER — Emergency Department (HOSPITAL_COMMUNITY): Payer: Medicare Other

## 2020-11-27 ENCOUNTER — Emergency Department (HOSPITAL_COMMUNITY)
Admission: EM | Admit: 2020-11-27 | Discharge: 2020-11-27 | Disposition: A | Discharge status: Home / Self Care | Payer: Medicare Other | Attending: Emergency Medicine | Admitting: Emergency Medicine

## 2020-11-27 DIAGNOSIS — I2510 Atherosclerotic heart disease of native coronary artery without angina pectoris: Secondary | ICD-10-CM | POA: Diagnosis not present

## 2020-11-27 DIAGNOSIS — Z951 Presence of aortocoronary bypass graft: Secondary | ICD-10-CM | POA: Diagnosis not present

## 2020-11-27 DIAGNOSIS — Z794 Long term (current) use of insulin: Secondary | ICD-10-CM | POA: Insufficient documentation

## 2020-11-27 DIAGNOSIS — M5126 Other intervertebral disc displacement, lumbar region: Secondary | ICD-10-CM | POA: Diagnosis not present

## 2020-11-27 DIAGNOSIS — E119 Type 2 diabetes mellitus without complications: Secondary | ICD-10-CM | POA: Diagnosis not present

## 2020-11-27 DIAGNOSIS — I10 Essential (primary) hypertension: Secondary | ICD-10-CM | POA: Insufficient documentation

## 2020-11-27 DIAGNOSIS — M5459 Other low back pain: Secondary | ICD-10-CM | POA: Diagnosis not present

## 2020-11-27 DIAGNOSIS — M545 Low back pain, unspecified: Secondary | ICD-10-CM | POA: Diagnosis not present

## 2020-11-27 MED ORDER — TRAMADOL HCL 50 MG PO TABS
50.0000 mg | ORAL_TABLET | Freq: Four times a day (QID) | ORAL | 0 refills | Status: DC | PRN
  Filled 2020-11-27: qty 15, 4d supply, fill #0

## 2020-11-27 MED ORDER — KETOROLAC TROMETHAMINE 60 MG/2ML IM SOLN
30.0000 mg | Freq: Once | INTRAMUSCULAR | Status: AC
  Administered 2020-11-27: 30 mg via INTRAMUSCULAR
  Filled 2020-11-27: qty 2

## 2020-11-27 MED ORDER — CELECOXIB 200 MG PO CAPS
200.0000 mg | ORAL_CAPSULE | Freq: Two times a day (BID) | ORAL | 0 refills | Status: DC
  Filled 2020-11-27: qty 20, 10d supply, fill #0

## 2020-11-27 NOTE — ED Triage Notes (Signed)
Pt here for lower R back pain that radiates down R leg, pt denies any recent trauma/injury. States he woke up w/ pain 2 days ago, and it has been progressively getting worse. Reports feeling like he has had a fever, afebrile in triage.

## 2020-11-27 NOTE — ED Notes (Signed)
Pt denies numbness and tingling. Pt denies loss of bowel or bladder. Pt has 2+ right pedal pulse, cap refill less than 3 sec, pt able to wiggle toes, warm to touch.

## 2020-11-27 NOTE — Discharge Instructions (Signed)
SEEK IMMEDIATE MEDICAL ATTENTION IF: New numbness, tingling, weakness, or problem with the use of your arms or legs.  Severe back pain not relieved with medications.  Change in bowel or bladder control.  Increasing pain in any areas of the body (such as chest or abdominal pain).  Shortness of breath, dizziness or fainting.  Nausea (feeling sick to your stomach), vomiting, fever, or sweats.  

## 2020-11-27 NOTE — ED Provider Notes (Signed)
[facility] EMERGENCY DEPARTMENT Provider Note   CSN: [id number] Arrival date & time: 11/27/20  1151     History Chief Complaint  Patient presents with   Back Pain    Carl Clark is a 70 y.o. male with a past medical history of recurrent low back pain after an injury many years ago who presents emergency department with chief complaint of sacral pain.  Patient states that he began having severe pain in his lumbar and sacral region 2 days ago.  He is having difficulty walking and using a cane.  He denies any saddle anesthesia, weakness in the lower extremities, radiation radiation down the leg.  He denies any urinary symptoms or fevers.  It is worse with movement, ambulation, better with rest, pain is severe.  He has seen orthopedics in the past for intra articular injection   Back Pain     Past Medical History:  Diagnosis Date   CAD (coronary artery disease) 03/16/2017   S/p NSTEMI 6/18 >> LHC: multivessel CAD >> s/p CABG // Echo 6/18: Mild LVH, EF 60-65, normal wall motion, grade 1 diastolic dysfunction, normal RV SF, PASP 36 (borderline pulmonary hypertension) // Pre-CABG Dopplers 09/13/16: Bilateral ICA 1-39   Echocardiogram    Echo 09/2018: EF 60-65, normal wall motion, RVSP 24.9   Family history of anesthesia complication    "never had anesthesia" (07/14/2012)   History of non-ST elevation myocardial infarction (NSTEMI) 09/12/2016   Treated with CABG   History of rectal bleeding    secondary to diverticulum affecting right hepatic flexure - 06/2012   Hypertension    Mild carotid artery disease (HCC)    a. 1-39% bilaterally preCABG in 2018.   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   History of non-ST elevation myocardial infarction (NSTEMI) 09/12/2016   Rectal bleeding 07/13/2012   Diabetes mellitus ([place]) 07/13/2012   Essential hypertension 07/13/2012    Past Surgical History:  Procedure Laterality Date   COLONOSCOPY Left 07/14/2012   Procedure: COLONOSCOPY;  Surgeon: Arta Silence, MD;  Location: [facility] ENDOSCOPY;  Service: Endoscopy;  Laterality: Left;   CORONARY ARTERY BYPASS GRAFT N/A 09/15/2016   Procedure: CORONARY ARTERY BYPASS GRAFTING (CABG) x 3, LIMA to LAD, SVG to DIAGONAL, SVG to OM2, USING LEFT MAMMARY ARTERY AND RIGHT GREATER SAPHENOUS VEIN HARVESTED ENDOSCOPICALLY;  Surgeon: Grace Isaac, MD;  Location: [place];  Service: Open Heart Surgery;  Laterality: N/A;   ESOPHAGOGASTRODUODENOSCOPY N/A 07/13/2012   Procedure: ESOPHAGOGASTRODUODENOSCOPY (EGD);  Surgeon: Arta Silence, MD;  Location: [facility] END[facility]COPY;  Service: Endoscopy;  Laterality: N/A;  pt in ED A8   LEFT HEART CATH AND CORONARY ANGIOGRAPHY N/A 09/12/2016   Procedure: Left Heart Cath and Coronary Angiography;  Surgeon: Sherren Mocha, MD;  Location: [place] CV LAB;  Service: Cardiovascular;  Laterality: N/A;   NO PAST SURGERIES     TEE WITHOUT CARDIOVERSION N/A 09/15/2016   Procedure: TRANSESOPHAGEAL ECHOCARDIOGRAM (TEE);  Surgeon: Grace Isaac, MD;  Location: [place];  Service: Open Heart Surgery;  Laterality: N/A;       Family History  Problem Relation Age of Onset  Diabetes Mother    Diabetes Father     Social History   Tobacco Use   Smoking status: Never   Smokeless tobacco: Never  Vaping Use   Vaping Use: Never used  Substance Use Topics   Alcohol use: No   Drug use: No    Home Medications Prior to Admission medications   Medication Sig Start Date End Date Taking? Authorizing Provider  amLODipine (NORVASC) 5 MG tablet TAKE 1 TABLET (5 MG TOTAL) BY MOUTH DAILY. PLEASE FILL AS  A 90 DAY SUPPLY 08/24/20 08/24/21  Gildardo Pounds, NP  atorvastatin (LIPITOR) 80 MG tablet TAKE 1 TABLET BY MOUTH ONCE DAILY AT 6PM. 08/24/20 11/22/20  Gildardo Pounds, NP  Blood Glucose Monitoring Suppl (ACCU-CHEK GUIDE ME) w/Device KIT 1 kit by Does not apply route 3 (three) times daily. Use to check blood sugar 3 times daily. E11.9 01/06/20   Gildardo Pounds, NP  Blood Glucose Monitoring Suppl (ONETOUCH VERIO REFLECT) w/Device KIT 1 KIT BY DOES NOT APPLY ROUTE 3 (THREE) TIMES DAILY. USE TO CHECK BLOOD SUGAR 3 TIMES DAILY. E11.9 01/06/20 01/05/21  Gildardo Pounds, NP  ciprofloxacin (CIPRO) 500 MG tablet Take 1 tablet (500 mg total) by mouth 2 (two) times daily. 10/28/20   Prosperi, Christian H, PA-C  fluticasone (FLONASE) 50 MCG/ACT nasal spray Place 2 sprays into both nostrils daily. 08/24/20   Gildardo Pounds, NP  glucose blood (ONETOUCH VERIO) test strip Use as instructed to check blood sugar 3 times weekly before breakfast. E11.9. 08/24/20   Gildardo Pounds, NP  glucose blood test strip USE AS INSTRUCTED TO CHECK BLOOD SUGAR 3 TIMES WEEKLY BEFORE BREAKFAST. 01/09/20 01/08/21  Charlott Rakes, MD  insulin detemir (LEVEMIR FLEXTOUCH) 100 UNIT/ML FlexPen Inject 28 Units into the skin 2 (two) times daily. Please fill as a 90 day supply 08/24/20 11/22/20  Gildardo Pounds, NP  Insulin Pen Needle 32G X 4 MM MISC USE AS INSTRUCTED. INJECT INTO THE SKIN TWICE DAILY. PATIENT REQUESTS 6 MONTH SUPPLY 08/24/20 08/24/21  Gildardo Pounds, NP  losartan (COZAAR) 100 MG tablet TAKE 1 TABLET (100 MG TOTAL) BY MOUTH DAILY. PLEASE FILL AS A 90 DAY SUPPLY 08/24/20 08/24/21  Gildardo Pounds, NP  metFORMIN (GLUCOPHAGE) 1000 MG tablet TAKE 1 TABLET (1,000 MG TOTAL) BY MOUTH 2 (TWO) TIMES DAILY WITH A MEAL. PLEASE FILL AS A 90 DAY SUPPLY 08/24/20 08/24/21  Gildardo Pounds, NP  metoprolol tartrate (LOPRESSOR) 25 MG tablet TAKE 1 TABLET (25 MG TOTAL) BY MOUTH 2 (TWO) TIMES DAILY. PLEASE FILL AS A 90 DAY SUPPLY 08/24/20 08/24/21  Gildardo Pounds,  NP  metroNIDAZOLE (FLAGYL) 500 MG tablet Take 1 tablet (500 mg total) by mouth 2 (two) times daily. 10/28/20   Prosperi, Christian H, PA-C  Misc. Devices MISC Please provide patient with an insurance approved QUAD CANE 08/10/17   Gildardo Pounds, NP  nitroGLYCERIN (NITROSTAT) 0.4 MG SL tablet PLACE 1 TABLET (0.4 MG TOTAL) UNDER THE TONGUE EVERY 5 (FIVE) MINUTES AS NEEDED FOR CHEST PAIN. 02/11/20 02/10/21  Bhagat, Crista Luria, PA  pantoprazole (PROTONIX) 40 MG tablet TAKE 1 TABLET (40 MG TOTAL) BY MOUTH DAILY. 08/24/20 11/22/20  Gildardo Pounds, NP  Probiotic, Lactobacillus, CAPS Take 1 tablet by mouth daily. 10/28/20   Prosperi, Christian H, PA-C  triamcinolone (KENALOG) 0.025 % ointment Apply 1 application topically 2 (two) times daily. TO back of head 08/24/20   Gildardo Pounds, NP  valACYclovir (VALTREX) 1000 MG tablet TAKE 2  TABLETS (2,000 MG TOTAL) BY MOUTH 2 (TWO) TIMES DAILY FOR 1 DAY. Patient not taking: Reported on 10/28/2020 05/25/20 05/25/21  Gildardo Pounds, NP    Allergies    Victoza [liraglutide]  Review of Systems   Review of Systems  Musculoskeletal:  Positive for back pain.  Ten systems reviewed and are negative for acute change, except as noted in the HPI.   Physical Exam Updated Vital Signs BP 137/62   Pulse 63   Temp 98.1 F (36.7 C)   Resp 14   SpO2 100%   Physical Exam Vitals and nursing note reviewed.  Constitutional:      General: He is not in acute distress.    Appearance: He is well-developed. He is not diaphoretic.  HENT:     Head: Normocephalic and atraumatic.  Eyes:     General: No scleral icterus.    Conjunctiva/sclera: Conjunctivae normal.  Cardiovascular:     Rate and Rhythm: Normal rate and regular rhythm.     Heart sounds: Normal heart sounds.  Pulmonary:     Effort: Pulmonary effort is normal. No respiratory distress.     Breath sounds: Normal breath sounds.  Abdominal:     Palpations: Abdomen is soft.     Tenderness: There is no abdominal  tenderness.  Musculoskeletal:     Cervical back: Normal range of motion and neck supple.     Comments: Patient appears to be in mild to moderate pain, antalgic gait noted. Lumbosacral spine area reveals no local tenderness or mass. Painful and reduced LS ROM noted. Straight leg raise is negative. DTR's, motor strength and sensation normal, including heel and toe gait.  Peripheral pulses are palpable.   Skin:    General: Skin is warm and dry.  Neurological:     Mental Status: He is alert.  Psychiatric:        Behavior: Behavior normal.    ED Results / Procedures / Treatments   Labs (all labs ordered are listed, but only abnormal results are displayed) Labs Reviewed - No data to display  EKG None  Radiology DG Lumbar Spine Complete  Result Date: 11/27/2020 CLINICAL DATA:  Back pain EXAM: LUMBAR SPINE - COMPLETE 4+ VIEW COMPARISON:  Lumbar spine radiograph 04/12/2017 FINDINGS: There is mild multilevel degenerative disc disease and mild lower lumbar predominant facet arthropathy. There is no evidence of lumbar spine fracture. Normal alignment. IMPRESSION: No evidence of acute lumbar spine fracture. Mild multilevel degenerative disease and lower lumbar predominant facet arthropathy. Electronically Signed   By: Maurine Simmering M.D.   On: 11/27/2020 14:16    Procedures Procedures   Medications Ordered in ED Medications  ketorolac (TORADOL) injection 30 mg (has no administration in time range)    ED Course  I have reviewed the triage vital signs and the nursing notes.  Pertinent labs & imaging results that were available during my care of the patient were reviewed by me and considered in my medical decision making (see chart for details).    MDM Rules/Calculators/A&P                           Patient with back pain.  No neurological deficits and normal neuro exam.  Patient can walk but states is painful.  No loss of bowel or bladder control.  No concern for cauda equina.  No fever,  night sweats, weight loss, h/o cancer, IVDU.  I reviewed the patient's lumbar film which shows degenerative  changes without acute abnormality. RICE protocol and pain medicine indicated and discussed with patient.   Final Clinical Impression(s) / ED Diagnoses Final diagnoses:  Acute midline low back pain without sciatica    Rx / DC Orders ED Discharge Orders     None        Margarita Mail, PA-C 11/27/20 1612    Luna Fuse, MD 12/05/20 1731

## 2020-11-30 ENCOUNTER — Ambulatory Visit: Payer: Medicare Other | Attending: Nurse Practitioner | Admitting: Nurse Practitioner

## 2020-11-30 ENCOUNTER — Other Ambulatory Visit: Payer: Self-pay

## 2020-11-30 ENCOUNTER — Encounter: Payer: Self-pay | Admitting: Nurse Practitioner

## 2020-11-30 VITALS — BP 143/72 | HR 57 | Ht 65.0 in | Wt 140.4 lb

## 2020-11-30 DIAGNOSIS — Z951 Presence of aortocoronary bypass graft: Secondary | ICD-10-CM | POA: Diagnosis not present

## 2020-11-30 DIAGNOSIS — E11649 Type 2 diabetes mellitus with hypoglycemia without coma: Secondary | ICD-10-CM | POA: Diagnosis not present

## 2020-11-30 DIAGNOSIS — Z23 Encounter for immunization: Secondary | ICD-10-CM | POA: Diagnosis not present

## 2020-11-30 DIAGNOSIS — E876 Hypokalemia: Secondary | ICD-10-CM | POA: Diagnosis not present

## 2020-11-30 DIAGNOSIS — Z888 Allergy status to other drugs, medicaments and biological substances status: Secondary | ICD-10-CM | POA: Diagnosis not present

## 2020-11-30 DIAGNOSIS — Z79899 Other long term (current) drug therapy: Secondary | ICD-10-CM | POA: Insufficient documentation

## 2020-11-30 DIAGNOSIS — E1165 Type 2 diabetes mellitus with hyperglycemia: Secondary | ICD-10-CM

## 2020-11-30 DIAGNOSIS — Z1211 Encounter for screening for malignant neoplasm of colon: Secondary | ICD-10-CM | POA: Insufficient documentation

## 2020-11-30 DIAGNOSIS — I25119 Atherosclerotic heart disease of native coronary artery with unspecified angina pectoris: Secondary | ICD-10-CM

## 2020-11-30 DIAGNOSIS — R21 Rash and other nonspecific skin eruption: Secondary | ICD-10-CM | POA: Insufficient documentation

## 2020-11-30 DIAGNOSIS — Z833 Family history of diabetes mellitus: Secondary | ICD-10-CM | POA: Insufficient documentation

## 2020-11-30 DIAGNOSIS — D649 Anemia, unspecified: Secondary | ICD-10-CM | POA: Insufficient documentation

## 2020-11-30 DIAGNOSIS — Z8249 Family history of ischemic heart disease and other diseases of the circulatory system: Secondary | ICD-10-CM | POA: Insufficient documentation

## 2020-11-30 DIAGNOSIS — K219 Gastro-esophageal reflux disease without esophagitis: Secondary | ICD-10-CM | POA: Diagnosis not present

## 2020-11-30 DIAGNOSIS — E785 Hyperlipidemia, unspecified: Secondary | ICD-10-CM | POA: Diagnosis not present

## 2020-11-30 DIAGNOSIS — I2510 Atherosclerotic heart disease of native coronary artery without angina pectoris: Secondary | ICD-10-CM | POA: Insufficient documentation

## 2020-11-30 DIAGNOSIS — K130 Diseases of lips: Secondary | ICD-10-CM

## 2020-11-30 DIAGNOSIS — I10 Essential (primary) hypertension: Secondary | ICD-10-CM | POA: Insufficient documentation

## 2020-11-30 DIAGNOSIS — Z794 Long term (current) use of insulin: Secondary | ICD-10-CM | POA: Diagnosis not present

## 2020-11-30 LAB — GLUCOSE, POCT (MANUAL RESULT ENTRY): POC Glucose: 82 mg/dl (ref 70–99)

## 2020-11-30 LAB — POCT GLYCOSYLATED HEMOGLOBIN (HGB A1C): Hemoglobin A1C: 7.1 % — AB (ref 4.0–5.6)

## 2020-11-30 MED ORDER — LEVEMIR FLEXTOUCH 100 UNIT/ML ~~LOC~~ SOPN
28.0000 [IU] | PEN_INJECTOR | Freq: Two times a day (BID) | SUBCUTANEOUS | 1 refills | Status: DC
  Filled 2020-11-30: qty 48, 86d supply, fill #0

## 2020-11-30 MED ORDER — ATORVASTATIN CALCIUM 80 MG PO TABS
ORAL_TABLET | ORAL | 3 refills | Status: DC
  Filled 2020-11-30: qty 90, 90d supply, fill #0

## 2020-11-30 MED ORDER — METFORMIN HCL 1000 MG PO TABS
ORAL_TABLET | ORAL | 1 refills | Status: DC
  Filled 2020-11-30: qty 180, 90d supply, fill #0

## 2020-11-30 MED ORDER — HYDROCORTISONE 0.5 % EX CREA
1.0000 "application " | TOPICAL_CREAM | Freq: Every day | CUTANEOUS | 1 refills | Status: DC
  Filled 2020-11-30: qty 30, 30d supply, fill #0

## 2020-11-30 MED ORDER — PANTOPRAZOLE SODIUM 40 MG PO TBEC
DELAYED_RELEASE_TABLET | Freq: Every day | ORAL | 1 refills | Status: DC
  Filled 2020-11-30: qty 90, 90d supply, fill #0

## 2020-11-30 NOTE — Progress Notes (Signed)
Assessment & Plan:  Carl Clark was seen today for diabetes and hypertension.  Diagnoses and all orders for this visit:  Type 2 diabetes mellitus with hyperglycemia, with long-term current use of insulin (HCC) -     POCT glycosylated hemoglobin (Hb A1C) -     POCT glucose (manual entry) -     insulin detemir (LEVEMIR FLEXTOUCH) 100 UNIT/ML FlexPen; Inject 28 Units into the skin 2 (two) times daily. Please fill as a 90 day supply -     metFORMIN (GLUCOPHAGE) 1000 MG tablet; TAKE 1 TABLET (1,000 MG TOTAL) BY MOUTH 2 (TWO) TIMES DAILY WITH A MEAL. PLEASE FILL AS A 90 DAY SUPPLY Continue blood sugar control as discussed in office today, low carbohydrate diet, and regular physical exercise as tolerated, 150 minutes per week (30 min each day, 5 days per week, or 50 min 3 days per week). Keep blood sugar logs with fasting goal of 90-130 mg/dl, post prandial (after you eat) less than 180.  For Hypoglycemia: BS <60 and Hyperglycemia BS >400; contact the clinic ASAP. Annual eye exams and foot exams are recommended.   Essential hypertension Continue amlodipine, losartan and metoprolol as prescribed.  Remember to bring in your blood pressure log with you for your follow up appointment.  DASH/Mediterranean Diets are healthier choices for HTN.    Dyslipidemia, goal LDL below 70 Continue atorvastatin as prescribed INSTRUCTIONS: Work on a low fat, heart healthy diet and participate in regular aerobic exercise program by working out at least 150 minutes per week; 5 days a week-30 minutes per day. Avoid red meat/beef/steak,  fried foods. junk foods, sodas, sugary drinks, unhealthy snacking, alcohol and smoking.  Drink at least 80 oz of water per day and monitor your carbohydrate intake daily.    Atherosclerosis of native coronary artery of native heart with angina pectoris (HCC) -     atorvastatin (LIPITOR) 80 MG tablet; TAKE 1 TABLET BY MOUTH ONCE DAILY AT 6PM.  GERD without esophagitis Well-controlled -      pantoprazole (PROTONIX) 40 MG tablet; TAKE 1 TABLET (40 MG TOTAL) BY MOUTH DAILY. INSTRUCTIONS: Avoid GERD Triggers: acidic, spicy or fried foods, caffeine, coffee, sodas,  alcohol and chocolate.    Hypokalemia -     Basic metabolic panel  Colon cancer screening -     Ambulatory referral to Gastroenterology  Mild anemia -     Ambulatory referral to Gastroenterology  Rash on lips -     hydrocortisone cream 0.5 %; Apply 1 application topically daily. Apply to lips -     Ambulatory referral to Dermatology  Need for immunization against influenza -     Flu Vaccine QUAD 50moIM (Fluarix, Fluzone & Alfiuria Quad PF)   Patient has been counseled on age-appropriate routine health concerns for screening and prevention. These are reviewed and up-to-date. Referrals have been placed accordingly. Immunizations are up-to-date or declined.    Subjective:   Chief Complaint  Patient presents with   Diabetes   Hypertension    Diabetes Pertinent negatives for hypoglycemia include no dizziness, headaches or seizures. Pertinent negatives for diabetes include no blurred vision, no chest pain and no weight loss.  Hypertension Pertinent negatives include no blurred vision, chest pain, headaches, malaise/fatigue, palpitations or shortness of breath.  [name]89y[place]. male presents to office today for follow up to DM, HTN and HPL VRI was used to communicate directly with patient for the entire encounter including providing detailed patient instructions.    He has  a past medical history of CAD (coronary artery disease) (03/16/2017), Echocardiogram, Family history of anesthesia complication, History of non-ST elevation myocardial infarction (NSTEMI) (09/12/2016), History of rectal bleeding, Hypertension, Mild carotid artery disease ([place]), and Type II diabetes mellitus ([place]).   He has a rash on his lips which appears to me as dry lips however he endorses burning sensation and lesions  which are not present today.  Previous treatment has included Valtrex which he reports was ineffective.  I had prescribed him Kenalog for a rash on the back of his head 3 months ago.  Which today he is now telling me that he has been applying to his lips.  I have instructed him not to apply any more of the Kenalog ointment to his lips and I will send in a low-dose hydrocortisone cream for this.  He will need to follow-up with the dermatologist.  DM 2/HTN/HPL Improved.  At this will continue metformin 1000 mg twice daily and Levemir 28 units twice daily.  He denies any symptoms of hypo or hyperglycemia.  LDL goal with atorvastatin 80 mg daily.  There is no history of statin intolerance or myalgias.  Blood pressure slightly elevated today however I do not plan to make any changes with his blood pressure medicine at this time.  He will continue on amlodipine 5 mg daily, metoprolol 25 mg twice daily and losartan 100 mg daily. Denies chest pain, shortness of breath, palpitations, lightheadedness, dizziness, headaches or BLE edema.   Lab Results  Component Value Date   HGBA1C 7.1 (A) 11/30/2020     Lab Results  Component Value Date   LDLCALC 46 08/24/2020    BP Readings from Last 3 Encounters:  11/30/20 (!) 143/72  11/27/20 131/63  10/28/20 120/68    Back Pain He has chronic low back pain and osteoarthritis of the right knee.  Recently evaluated in the emergency room and was treated with Celebrex and tramadol which he reports have significantly decreased his pain.  I do not recommend continuation of Celebrex due to his cardiac history however I will continue his tramadol as requested.  Back pain is related to an injury several years ago.  Pain extends from the lower lumbar area into the sacrum.  He does use a cane to assist with ambulation.  Denies any involuntary loss of urine or stool.  Aggravating factors: Walking, prolonged standing or sitting.  Relieving factors: Rest. He has been evaluated by  orthopedics in the past and received spinal injection. .  Review of Systems  Constitutional:  Negative for fever, malaise/fatigue and weight loss.  HENT: Negative.  Negative for nosebleeds.   Eyes: Negative.  Negative for blurred vision, double vision and photophobia.  Respiratory: Negative.  Negative for cough and shortness of breath.   Cardiovascular: Negative.  Negative for chest pain, palpitations and leg swelling.  Gastrointestinal: Negative.  Negative for heartburn, nausea and vomiting.  Musculoskeletal:  Positive for back pain and joint pain. Negative for myalgias.  Skin:  Positive for rash.  Neurological: Negative.  Negative for dizziness, focal weakness, seizures and headaches.  Psychiatric/Behavioral: Negative.  Negative for suicidal ideas.    Past Medical History:  Diagnosis Date   CAD (coronary artery disease) 03/16/2017   S/p NSTEMI 6/18 >> LHC: multivessel CAD >> s/p CABG // Echo 6/18: Mild LVH, EF 60-65, normal wall motion, grade 1 diastolic dysfunction, normal RV SF, PASP 36 (borderline pulmonary hypertension) // Pre-CABG Dopplers 09/13/16: Bilateral ICA 1-39   Echocardiogram    Echo  09/2018: EF 60-65, normal wall motion, RVSP 24.9   Family history of anesthesia complication    "never had anesthesia" (07/14/2012)   History of non-ST elevation myocardial infarction (NSTEMI) 09/12/2016   Treated with CABG   History of rectal bleeding    secondary to diverticulum affecting right hepatic flexure - 06/2012   Hypertension    Mild carotid artery disease (HCC)    a. 1-39% bilaterally preCABG in 2018.   Type II diabetes mellitus [facility])     Past Surgical History:  Procedure Laterality Date   COLONOSCOPY Left 07/14/2012   Procedure: COLONOSCOPY;  Surgeon: Arta Silence, MD;  Location: [facility] ENDOSCOPY;  Service: Endoscopy;  Laterality: Left;   CORONARY ARTERY BYPASS GRAFT N/A 09/15/2016   Procedure: CORONARY ARTERY BYPASS GRAFTING (CABG) x 3, LIMA to LAD, SVG to DIAGONAL, SVG to OM2,  USING LEFT MAMMARY ARTERY AND RIGHT GREATER SAPHENOUS VEIN HARVESTED ENDOSCOPICALLY;  Surgeon: Grace Isaac, MD;  Location: [place];  Service: Open Heart Surgery;  Laterality: N/A;   ESOPHAGOGASTRODUODENOSCOPY N/A 07/13/2012   Procedure: ESOPHAGOGASTRODUODENOSCOPY (EGD);  Surgeon: Arta Silence, MD;  Location: [facility] ENDOSCOPY;  Service: Endoscopy;  Laterality: N/A;  pt in ED A8   LEFT HEART CATH AND CORONARY ANGIOGRAPHY N/A 09/12/2016   Procedure: Left Heart Cath and Coronary Angiography;  Surgeon: Sherren Mocha, MD;  Location: [place] CV LAB;  Service: Cardiovascular;  Laterality: N/A;   NO PAST SURGERIES     TEE WITHOUT CARDIOVERSION N/A 09/15/2016   Procedure: TRANSESOPHAGEAL ECHOCARDIOGRAM (TEE);  Surgeon: Grace Isaac, MD;  Location: [place];  Service: Open Heart Surgery;  Laterality: N/A;    Family History  Problem Relation Age of Onset   Diabetes Mother    Diabetes Father     Social History Reviewed with no changes to be made today.   Outpatient Medications Prior to Visit  Medication Sig Dispense Refill   amLODipine (NORVASC) 5 MG tablet TAKE 1 TABLET (5 MG TOTAL) BY MOUTH DAILY. PLEASE FILL AS A 90 DAY SUPPLY 90 tablet 3   Blood Glucose Monitoring Suppl (ACCU-CHEK GUIDE ME) w/Device KIT 1 kit by Does not apply route 3 (three) times daily. Use to check blood sugar 3 times daily. E11.9 1 kit 0   Blood Glucose Monitoring Suppl (ONETOUCH VERIO REFLECT) w/Device KIT 1 KIT BY DOES NOT APPLY ROUTE 3 (THREE) TIMES DAILY. USE TO CHECK BLOOD SUGAR 3 TIMES DAILY. E11.9 1 kit 0   fluticasone (FLONASE) 50 MCG/ACT nasal spray Place 2 sprays into both nostrils daily. 16 g 6   glucose blood (ONETOUCH VERIO) test strip Use as instructed to check blood sugar 3 times weekly before breakfast. E11.9. 200 each 6   glucose blood test strip USE AS INSTRUCTED TO CHECK BLOOD SUGAR 3 TIMES WEEKLY BEFORE BREAKFAST. 100 strip 6   Insulin Pen Needle 32G X 4 MM MISC USE AS INSTRUCTED. INJECT INTO THE  SKIN TWICE DAILY. PATIENT REQUESTS 6 MONTH SUPPLY 200 each 11   losartan (COZAAR) 100 MG tablet TAKE 1 TABLET (100 MG TOTAL) BY MOUTH DAILY. PLEASE FILL AS A 90 DAY SUPPLY 180 tablet 3   metoprolol tartrate (LOPRESSOR) 25 MG tablet TAKE 1 TABLET (25 MG TOTAL) BY MOUTH 2 (TWO) TIMES DAILY. PLEASE FILL AS A 90 DAY SUPPLY 180 tablet 1   Misc. Devices MISC Please provide patient with an insurance approved QUAD CANE 1 each 0   nitroGLYCERIN (NITROSTAT) 0.4 MG SL tablet PLACE 1 TABLET (0.4 MG TOTAL) UNDER THE TONGUE EVERY  5 (FIVE) MINUTES AS NEEDED FOR CHEST PAIN. 90 tablet 1   Probiotic, Lactobacillus, CAPS Take 1 tablet by mouth daily. 30 capsule 0   traMADol (ULTRAM) 50 MG tablet Take 1 tablet (50 mg total) by mouth every 6 (six) hours as needed. 15 tablet 0   triamcinolone (KENALOG) 0.025 % ointment Apply 1 application topically 2 (two) times daily. TO back of head 60 g 1   valACYclovir (VALTREX) 1000 MG tablet TAKE 2 TABLETS (2,000 MG TOTAL) BY MOUTH 2 (TWO) TIMES DAILY FOR 1 DAY. 4 tablet 1   celecoxib (CELEBREX) 200 MG capsule Take 1 capsule (200 mg total) by mouth 2 (two) times daily. 20 capsule 0   ciprofloxacin (CIPRO) 500 MG tablet Take 1 tablet (500 mg total) by mouth 2 (two) times daily. 14 tablet 0   metFORMIN (GLUCOPHAGE) 1000 MG tablet TAKE 1 TABLET (1,000 MG TOTAL) BY MOUTH 2 (TWO) TIMES DAILY WITH A MEAL. PLEASE FILL AS A 90 DAY SUPPLY 180 tablet 1   metroNIDAZOLE (FLAGYL) 500 MG tablet Take 1 tablet (500 mg total) by mouth 2 (two) times daily. 14 tablet 0   atorvastatin (LIPITOR) 80 MG tablet TAKE 1 TABLET BY MOUTH ONCE DAILY AT 6PM. 90 tablet 3   insulin detemir (LEVEMIR FLEXTOUCH) 100 UNIT/ML FlexPen Inject 28 Units into the skin 2 (two) times daily. Please fill as a 90 day supply 50.4 mL 1   pantoprazole (PROTONIX) 40 MG tablet TAKE 1 TABLET (40 MG TOTAL) BY MOUTH DAILY. 90 tablet 1   No facility-administered medications prior to visit.    Allergies  Allergen Reactions   Victoza  [Liraglutide] Swelling    Lip swelling, itching, rash       Objective:    BP (!) 143/72   Pulse (!) 57   Ht _0  (1.651 m)   Wt 140 lb 6 oz (63.7 kg)   SpO2 98%   BMI 23.36 kg/m  Wt Readings from Last 3 Encounters:  11/30/20 140 lb 6 oz (63.7 kg)  08/24/20 136 lb 6.4 oz (61.9 kg)  05/25/20 138 lb 6.4 oz (62.8 kg)    Physical Exam Vitals and nursing note reviewed.  Constitutional:      Appearance: He is well-developed.  HENT:     Head: Normocephalic and atraumatic.  Cardiovascular:     Rate and Rhythm: Normal rate and regular rhythm.     Heart sounds: Normal heart sounds. No murmur heard.   No friction rub. No gallop.  Pulmonary:     Effort: Pulmonary effort is normal. No tachypnea or respiratory distress.     Breath sounds: Normal breath sounds. No decreased breath sounds, wheezing, rhonchi or rales.  Chest:     Chest wall: No tenderness.  Abdominal:     General: Bowel sounds are normal.     Palpations: Abdomen is soft.  Musculoskeletal:        General: Normal range of motion.     Cervical back: Normal range of motion.  Skin:    General: Skin is warm and dry.  Neurological:     Mental Status: He is alert and oriented to person, place, and time.     Coordination: Coordination normal.     Comments: Using cane to assist with mobility  Psychiatric:        Behavior: Behavior normal. Behavior is cooperative.        Thought Content: Thought content normal.        Judgment: Judgment normal.  Patient has been counseled extensively about nutrition and exercise as well as the importance of adherence with medications and regular follow-up. The patient was given clear instructions to go to ER or return to medical center if symptoms don't improve, worsen or new problems develop. The patient verbalized understanding.   Follow-up: Return in about 3 months (around 03/01/2021).   Gildardo Pounds, FNP-BC [facility] [place],  [place]   11/30/2020, 9:17 AM

## 2020-11-30 NOTE — Progress Notes (Signed)
Carl Clark [id number]

## 2020-12-01 ENCOUNTER — Other Ambulatory Visit: Payer: Self-pay

## 2020-12-01 LAB — BASIC METABOLIC PANEL
BUN/Creatinine Ratio: 15 (ref 10–24)
BUN: 13 mg/dL (ref 8–27)
CO2: 25 mmol/L (ref 20–29)
Calcium: 9.1 mg/dL (ref 8.6–10.2)
Chloride: 103 mmol/L (ref 96–106)
Creatinine, Ser: 0.89 mg/dL (ref 0.76–1.27)
Glucose: 80 mg/dL (ref 65–99)
Potassium: 4.5 mmol/L (ref 3.5–5.2)
Sodium: 141 mmol/L (ref 134–144)
eGFR: 93 mL/min/{1.73_m2} (ref >59)

## 2020-12-15 ENCOUNTER — Other Ambulatory Visit: Payer: Self-pay

## 2020-12-15 MED ORDER — DICLOFENAC SODIUM 75 MG PO TBEC
DELAYED_RELEASE_TABLET | ORAL | 2 refills | Status: DC
  Filled 2020-12-15: qty 60, 30d supply, fill #0
  Filled 2021-01-18: qty 60, 30d supply, fill #1
  Filled 2021-02-22: qty 60, 30d supply, fill #2

## 2020-12-15 MED ORDER — PREGABALIN 75 MG PO CAPS
ORAL_CAPSULE | ORAL | 2 refills | Status: DC
  Filled 2020-12-15: qty 60, 30d supply, fill #0
  Filled 2021-01-18: qty 60, 30d supply, fill #1
  Filled 2021-02-22: qty 60, 30d supply, fill #2

## 2021-01-11 ENCOUNTER — Other Ambulatory Visit: Payer: Self-pay

## 2021-01-11 MED FILL — Nitroglycerin SL Tab 0.4 MG: SUBLINGUAL | 7 days supply | Qty: 25 | Fill #0 | Status: AC

## 2021-01-18 ENCOUNTER — Other Ambulatory Visit: Payer: Self-pay

## 2021-01-27 DIAGNOSIS — Z20828 Contact with and (suspected) exposure to other viral communicable diseases: Secondary | ICD-10-CM | POA: Diagnosis not present

## 2021-02-01 ENCOUNTER — Other Ambulatory Visit: Payer: Self-pay

## 2021-02-02 ENCOUNTER — Other Ambulatory Visit: Payer: Self-pay

## 2021-02-03 ENCOUNTER — Other Ambulatory Visit: Payer: Self-pay

## 2021-02-22 ENCOUNTER — Other Ambulatory Visit: Payer: Self-pay

## 2021-02-23 ENCOUNTER — Other Ambulatory Visit: Payer: Self-pay

## 2021-02-24 ENCOUNTER — Other Ambulatory Visit: Payer: Self-pay

## 2021-03-01 ENCOUNTER — Encounter: Payer: Self-pay | Admitting: Nurse Practitioner

## 2021-03-01 ENCOUNTER — Other Ambulatory Visit: Payer: Self-pay

## 2021-03-01 ENCOUNTER — Other Ambulatory Visit: Payer: Self-pay | Admitting: Pharmacist

## 2021-03-01 ENCOUNTER — Ambulatory Visit: Payer: Medicare Other | Attending: Nurse Practitioner | Admitting: Nurse Practitioner

## 2021-03-01 VITALS — BP 153/79 | HR 63 | Ht 65.0 in | Wt 144.5 lb

## 2021-03-01 DIAGNOSIS — I25119 Atherosclerotic heart disease of native coronary artery with unspecified angina pectoris: Secondary | ICD-10-CM | POA: Diagnosis not present

## 2021-03-01 DIAGNOSIS — Z794 Long term (current) use of insulin: Secondary | ICD-10-CM

## 2021-03-01 DIAGNOSIS — K219 Gastro-esophageal reflux disease without esophagitis: Secondary | ICD-10-CM

## 2021-03-01 DIAGNOSIS — F5101 Primary insomnia: Secondary | ICD-10-CM

## 2021-03-01 DIAGNOSIS — I10 Essential (primary) hypertension: Secondary | ICD-10-CM | POA: Diagnosis not present

## 2021-03-01 DIAGNOSIS — E1165 Type 2 diabetes mellitus with hyperglycemia: Secondary | ICD-10-CM

## 2021-03-01 DIAGNOSIS — D649 Anemia, unspecified: Secondary | ICD-10-CM

## 2021-03-01 LAB — POCT GLYCOSYLATED HEMOGLOBIN (HGB A1C): Hemoglobin A1C: 12.5 % — AB (ref 4.0–5.6)

## 2021-03-01 LAB — GLUCOSE, POCT (MANUAL RESULT ENTRY): POC Glucose: 309 mg/dl — AB (ref 70–99)

## 2021-03-01 MED ORDER — ATORVASTATIN CALCIUM 80 MG PO TABS
ORAL_TABLET | ORAL | 3 refills | Status: DC
  Filled 2021-03-01: qty 90, 90d supply, fill #0

## 2021-03-01 MED ORDER — METFORMIN HCL 1000 MG PO TABS
1000.0000 mg | ORAL_TABLET | Freq: Two times a day (BID) | ORAL | 1 refills | Status: DC
  Filled 2021-03-01: qty 180, 90d supply, fill #0

## 2021-03-01 MED ORDER — LEVEMIR FLEXTOUCH 100 UNIT/ML ~~LOC~~ SOPN
28.0000 [IU] | PEN_INJECTOR | Freq: Two times a day (BID) | SUBCUTANEOUS | 1 refills | Status: DC
  Filled 2021-03-01: qty 45, 80d supply, fill #0

## 2021-03-01 MED ORDER — METOPROLOL TARTRATE 25 MG PO TABS
25.0000 mg | ORAL_TABLET | Freq: Two times a day (BID) | ORAL | 1 refills | Status: DC
  Filled 2021-03-01: qty 180, 90d supply, fill #0

## 2021-03-01 MED ORDER — ONETOUCH VERIO VI STRP
ORAL_STRIP | 6 refills | Status: DC
  Filled 2021-03-01: qty 200, 66d supply, fill #0

## 2021-03-01 MED ORDER — TRAZODONE HCL 50 MG PO TABS
25.0000 mg | ORAL_TABLET | Freq: Every day | ORAL | 1 refills | Status: DC
  Filled 2021-03-01: qty 30, 30d supply, fill #0

## 2021-03-01 MED ORDER — LOSARTAN POTASSIUM 100 MG PO TABS
100.0000 mg | ORAL_TABLET | Freq: Every day | ORAL | 1 refills | Status: DC
  Filled 2021-03-01: qty 90, 90d supply, fill #0

## 2021-03-01 MED ORDER — GLIPIZIDE 5 MG PO TABS
5.0000 mg | ORAL_TABLET | Freq: Two times a day (BID) | ORAL | 3 refills | Status: DC
  Filled 2021-03-01: qty 60, 30d supply, fill #0

## 2021-03-01 MED ORDER — TRUE METRIX BLOOD GLUCOSE TEST VI STRP
ORAL_STRIP | 3 refills | Status: DC
  Filled 2021-03-01: qty 100, 33d supply, fill #0

## 2021-03-01 MED ORDER — AMLODIPINE BESYLATE 5 MG PO TABS
5.0000 mg | ORAL_TABLET | Freq: Every day | ORAL | 3 refills | Status: DC
  Filled 2021-03-01: qty 90, 90d supply, fill #0

## 2021-03-01 MED ORDER — PANTOPRAZOLE SODIUM 40 MG PO TBEC
DELAYED_RELEASE_TABLET | Freq: Every day | ORAL | 1 refills | Status: DC
  Filled 2021-03-01: qty 90, 90d supply, fill #0

## 2021-03-01 NOTE — Progress Notes (Signed)
Carl Clark [id number]

## 2021-03-01 NOTE — Progress Notes (Signed)
Assessment & Plan:  [name] was seen today for diabetes.  Diagnoses and all orders for this visit:  Type 2 diabetes mellitus with hyperglycemia, with long-term current use of insulin (HCC) -     POCT glycosylated hemoglobin (Hb A1C) -     POCT glucose (manual entry) -     glipiZIDE (GLUCOTROL) 5 MG tablet; Take 1 tablet (5 mg total) by mouth 2 (two) times daily before a meal. -     insulin detemir (LEVEMIR FLEXTOUCH) 100 UNIT/ML FlexPen; Inject 28 Units into the skin 2 (two) times daily. -     metFORMIN (GLUCOPHAGE) 1000 MG tablet; Take 1 tablet (1,000 mg total) by mouth 2 (two) times daily with a meal. For DIABETES. -     CMP14+EGFR -     Ambulatory referral to Ophthalmology -     Discontinue: glucose blood (ONETOUCH VERIO) test strip; Use as instructed to check blood sugar 3 times daily. E11.9.  Primary insomnia -     traZODone (DESYREL) 50 MG tablet; Take 0.5-1 tablets (25-50 mg total) by mouth at bedtime. For SLEEP  Atherosclerosis of native coronary artery of native heart with angina pectoris (HCC) -     atorvastatin (LIPITOR) 80 MG tablet; Take once daily at 6pm for cholesterol .  Essential hypertension -     amLODipine (NORVASC) 5 MG tablet; Take 1 tablet (5 mg total) by mouth daily. FOR BLOOD PRESSURE. Please fill as a 90 day supply -     losartan (COZAAR) 100 MG tablet; Take 1 tablet (100 mg total) by mouth daily. FOR BLOOD PRESSURE. Please fill as a 90 day supply -     metoprolol tartrate (LOPRESSOR) 25 MG tablet; Take 1 tablet (25 mg total) by mouth 2 (two) times daily. FOR BLOOD PRESSURE. Please fill as a 90 day supply  GERD without esophagitis -     pantoprazole (PROTONIX) 40 MG tablet; TAKE 1 TABLET (40 MG TOTAL) BY MOUTH DAILY.  Anemia, unspecified type -     CBC with Differential    Patient has been counseled on age-appropriate routine health concerns for screening and prevention. These are reviewed and up-to-date. Referrals have been placed accordingly.  Immunizations are up-to-date or declined.    Subjective:   Chief Complaint  Patient presents with   Diabetes    HPI Carl Clark 70 y.o. male presents to office today  For follow-up to diabetes. VRI was used to communicate directly with patient for the entire encounter including providing detailed patient instructions.     He has a past medical history of CAD (03/16/2017), Echocardiogram, Family history of anesthesia complication, History of non-ST elevation myocardial infarction (NSTEMI) (09/12/2016), History of rectal bleeding, Hypertension, Mild carotid artery disease, and Type II diabetes mellitu.   DM 2 Diabetes is poorly controlled.  He has been drinking milk and eating cookies, biscuits and sweet breads.  Endorses medication adherence using Levemir 28 units twice daily and Forman 1000 mg twice daily.  I am going to resume his glipizide 5 mg twice a day.  He was previously on 10 mg twice a day however he stated in the past 10 mg was causing hypoglycemia. Lab Results  Component Value Date   HGBA1C 12.5 (A) 03/01/2021   Lab Results  Component Value Date   LDLCALC 46 08/24/2020      Insomnia Not sleeping well. Only sleeping 2-3 hours per day.  States sleepy time tea over-the-counter was ineffective.  HTN Blood pressure is elevated. He is  not taking his blood pressure medications every day as prescribed.  He is currently on losartan 100 mg daily, metoprolol 25 mg twice daily and amlodipine 5 mg daily.  I am hesitant to make any changes with his medications as he is not currently taking the above listed medications as prescribed  BP Readings from Last 3 Encounters:  03/01/21 (!) 153/79  11/30/20 (!) 143/72  11/27/20 131/63       Review of Systems  Constitutional:  Negative for fever, malaise/fatigue and weight loss.  HENT: Negative.  Negative for nosebleeds.   Eyes: Negative.  Negative for blurred vision, double vision and photophobia.  Respiratory: Negative.   Negative for cough and shortness of breath.   Cardiovascular: Negative.  Negative for chest pain, palpitations and leg swelling.  Gastrointestinal: Negative.  Negative for heartburn, nausea and vomiting.  Musculoskeletal: Negative.  Negative for myalgias.  Neurological: Negative.  Negative for dizziness, focal weakness, seizures and headaches.  Psychiatric/Behavioral:  Negative for suicidal ideas. The patient has insomnia.    Past Medical History:  Diagnosis Date   CAD (coronary artery disease) 03/16/2017   S/p NSTEMI 6/18 >> LHC: multivessel CAD >> s/p CABG // Echo 6/18: Mild LVH, EF 60-65, normal wall motion, grade 1 diastolic dysfunction, normal RV SF, PASP 36 (borderline pulmonary hypertension) // Pre-CABG Dopplers 09/13/16: Bilateral ICA 1-39   Echocardiogram    Echo 09/2018: EF 60-65, normal wall motion, RVSP 24.9   Family history of anesthesia complication    "never had anesthesia" (07/14/2012)   History of non-ST elevation myocardial infarction (NSTEMI) 09/12/2016   Treated with CABG   History of rectal bleeding    secondary to diverticulum affecting right hepatic flexure - 06/2012   Hypertension    Mild carotid artery disease (HCC)    a. 1-39% bilaterally preCABG in 2018.   Type II diabetes mellitus [facility])     Past Surgical History:  Procedure Laterality Date   COLONOSCOPY Left 07/14/2012   Procedure: COLONOSCOPY;  Surgeon: Arta Silence, MD;  Location: [facility] ENDOSCOPY;  Service: Endoscopy;  Laterality: Left;   CORONARY ARTERY BYPASS GRAFT N/A 09/15/2016   Procedure: CORONARY ARTERY BYPASS GRAFTING (CABG) x 3, LIMA to LAD, SVG to DIAGONAL, SVG to OM2, USING LEFT MAMMARY ARTERY AND RIGHT GREATER SAPHENOUS VEIN HARVESTED ENDOSCOPICALLY;  Surgeon: Grace Isaac, MD;  Location: [place];  Service: Open Heart Surgery;  Laterality: N/A;   ESOPHAGOGASTRODUODENOSCOPY N/A 07/13/2012   Procedure: ESOPHAGOGASTRODUODENOSCOPY (EGD);  Surgeon: Arta Silence, MD;  Location[facility] END[facility]COPY;  Service:  Endoscopy;  Laterality: N/A;  pt in ED A8   LEFT HEART CATH AND CORONARY ANGIOGRAPHY N/A 09/12/2016   Procedure: Left Heart Cath and Coronary Angiography;  Surgeon: Sherren Mocha, MD;  Location: [place] CV LAB;  Service: Cardiovascular;  Laterality: N/A;   NO PAST SURGERIES     TEE WITHOUT CARDIOVERSION N/A 09/15/2016   Procedure: TRANSESOPHAGEAL ECHOCARDIOGRAM (TEE);  Surgeon: Grace Isaac, MD;  Location: [place];  Service: Open Heart Surgery;  Laterality: N/A;    Family History  Problem Relation Age of Onset   Diabetes Mother    Diabetes Father     Social History Reviewed with no changes to be made today.   Outpatient Medications Prior to Visit  Medication Sig Dispense Refill   Blood Glucose Monitoring Suppl (ACCU-CHEK GUIDE ME) w/Device KIT 1 kit by Does not apply route 3 (three) times daily. Use to check blood sugar 3 times daily. E11.9 1 kit 0   fluticasone (FLONASE)  50 MCG/ACT nasal spray Place 2 sprays into both nostrils daily. 16 g 6   hydrocortisone cream 0.5 % Apply 1 application topically daily. Apply to lips 30 g 1   Insulin Pen Needle 32G X 4 MM MISC USE AS INSTRUCTED. INJECT INTO THE SKIN TWICE DAILY. PATIENT REQUESTS 6 MONTH SUPPLY 200 each 11   Misc. Devices MISC Please provide patient with an insurance approved QUAD CANE 1 each 0   Probiotic, Lactobacillus, CAPS Take 1 tablet by mouth daily. 30 capsule 0   triamcinolone (KENALOG) 0.025 % ointment Apply 1 application topically 2 (two) times daily. TO back of head 60 g 1   amLODipine (NORVASC) 5 MG tablet TAKE 1 TABLET (5 MG TOTAL) BY MOUTH DAILY. PLEASE FILL AS A 90 DAY SUPPLY 90 tablet 3   atorvastatin (LIPITOR) 80 MG tablet TAKE 1 TABLET BY MOUTH ONCE DAILY AT 6PM. 90 tablet 3   diclofenac (VOLTAREN) 75 MG EC tablet Take 1 (one) Tablet DR by mouth two times daily after meals 60 tablet 2   glucose blood (ONETOUCH VERIO) test strip Use as instructed to check blood sugar 3 times weekly before breakfast. E11.9. 200  each 6   losartan (COZAAR) 100 MG tablet TAKE 1 TABLET (100 MG TOTAL) BY MOUTH DAILY. PLEASE FILL AS A 90 DAY SUPPLY 180 tablet 3   metFORMIN (GLUCOPHAGE) 1000 MG tablet TAKE 1 TABLET (1,000 MG TOTAL) BY MOUTH 2 (TWO) TIMES DAILY WITH A MEAL. PLEASE FILL AS A 90 DAY SUPPLY 180 tablet 1   metoprolol tartrate (LOPRESSOR) 25 MG tablet TAKE 1 TABLET (25 MG TOTAL) BY MOUTH 2 (TWO) TIMES DAILY. PLEASE FILL AS A 90 DAY SUPPLY 180 tablet 1   pantoprazole (PROTONIX) 40 MG tablet TAKE 1 TABLET (40 MG TOTAL) BY MOUTH DAILY. 90 tablet 1   pregabalin (LYRICA) 75 MG capsule Take 1 (one) Capsule by mouth two times daily 60 capsule 2   traMADol (ULTRAM) 50 MG tablet Take 1 tablet (50 mg total) by mouth every 6 (six) hours as needed. 15 tablet 0   valACYclovir (VALTREX) 1000 MG tablet TAKE 2 TABLETS (2,000 MG TOTAL) BY MOUTH 2 (TWO) TIMES DAILY FOR 1 DAY. 4 tablet 1   nitroGLYCERIN (NITROSTAT) 0.4 MG SL tablet PLACE 1 TABLET (0.4 MG TOTAL) UNDER THE TONGUE EVERY 5 (FIVE) MINUTES AS NEEDED FOR CHEST PAIN. 90 tablet 1   insulin detemir (LEVEMIR FLEXTOUCH) 100 UNIT/ML FlexPen Inject 28 Units into the skin 2 (two) times daily. Please fill as a 90 day supply 50.4 mL 1   No facility-administered medications prior to visit.    Allergies  Allergen Reactions   Victoza [Liraglutide] Swelling    Lip swelling, itching, rash       Objective:    BP (!) 153/79   Pulse 63   Ht 5' 5" (1.651 m)   Wt 144 lb 8 oz (65.5 kg)   SpO2 100%   BMI 24.05 kg/m  Wt Readings from Last 3 Encounters:  03/01/21 144 lb 8 oz (65.5 kg)  11/30/20 140 lb 6 oz (63.7 kg)  08/24/20 136 lb 6.4 oz (61.9 kg)    Physical Exam Vitals and nursing note reviewed.  Constitutional:      Appearance: He is well-developed.  HENT:     Head: Normocephalic and atraumatic.  Cardiovascular:     Rate and Rhythm: Normal rate and regular rhythm.     Heart sounds: Normal heart sounds. No murmur heard.   No friction rub. No  gallop.  Pulmonary:      Effort: Pulmonary effort is normal. No tachypnea or respiratory distress.     Breath sounds: Normal breath sounds. No decreased breath sounds, wheezing, rhonchi or rales.  Chest:     Chest wall: No tenderness.  Abdominal:     General: Bowel sounds are normal.     Palpations: Abdomen is soft.  Musculoskeletal:        General: Normal range of motion.     Cervical back: Normal range of motion.  Skin:    General: Skin is warm and dry.  Neurological:     Mental Status: He is alert and oriented to person, place, and time.     Coordination: Coordination normal.  Psychiatric:        Behavior: Behavior normal. Behavior is cooperative.        Thought Content: Thought content normal.        Judgment: Judgment normal.         Patient has been counseled extensively about nutrition and exercise as well as the importance of adherence with medications and regular follow-up. The patient was given clear instructions to go to ER or return to medical center if symptoms don't improve, worsen or new problems develop. The patient verbalized understanding.   Follow-up: Return in about 6 weeks (around 04/12/2021) for 6 weeks meter check with luke. See me in 3 months.   Gildardo Pounds, FNP-BC [facility] [place], [place]   03/01/2021, 1:28 PM

## 2021-03-02 ENCOUNTER — Other Ambulatory Visit: Payer: Self-pay

## 2021-03-02 LAB — CMP14+EGFR
ALT: 13 IU/L (ref 0–44)
AST: 16 IU/L (ref 0–40)
Albumin/Globulin Ratio: 1 — ABNORMAL LOW (ref 1.2–2.2)
Albumin: 4.1 g/dL (ref 3.8–4.8)
Alkaline Phosphatase: 110 IU/L (ref 44–121)
BUN/Creatinine Ratio: 16 (ref 10–24)
BUN: 20 mg/dL (ref 8–27)
Bilirubin Total: 0.5 mg/dL (ref 0.0–1.2)
CO2: 26 mmol/L (ref 20–29)
Calcium: 9.6 mg/dL (ref 8.6–10.2)
Chloride: 100 mmol/L (ref 96–106)
Creatinine, Ser: 1.27 mg/dL (ref 0.76–1.27)
Globulin, Total: 4.1 g/dL (ref 1.5–4.5)
Glucose: 254 mg/dL — ABNORMAL HIGH (ref 70–99)
Potassium: 4.5 mmol/L (ref 3.5–5.2)
Sodium: 137 mmol/L (ref 134–144)
Total Protein: 8.2 g/dL (ref 6.0–8.5)
eGFR: 61 mL/min/{1.73_m2} (ref >59)

## 2021-03-02 LAB — CBC WITH DIFFERENTIAL/PLATELET
Basophils Absolute: 0.1 10*3/uL (ref 0.0–0.2)
Basos: 1 %
EOS (ABSOLUTE): 0.6 10*3/uL — ABNORMAL HIGH (ref 0.0–0.4)
Eos: 14 %
Hematocrit: 38.2 % (ref 37.5–51.0)
Hemoglobin: 12.5 g/dL — ABNORMAL LOW (ref 13.0–17.7)
Immature Grans (Abs): 0 10*3/uL (ref 0.0–0.1)
Immature Granulocytes: 1 %
Lymphocytes Absolute: 1.3 10*3/uL (ref 0.7–3.1)
Lymphs: 32 %
MCH: 30.6 pg (ref 26.6–33.0)
MCHC: 32.7 g/dL (ref 31.5–35.7)
MCV: 94 fL (ref 79–97)
Monocytes Absolute: 0.6 10*3/uL (ref 0.1–0.9)
Monocytes: 15 %
Neutrophils Absolute: 1.6 10*3/uL (ref 1.4–7.0)
Neutrophils: 37 %
Platelets: 288 10*3/uL (ref 150–450)
RBC: 4.08 x10E6/uL — ABNORMAL LOW (ref 4.14–5.80)
RDW: 11.7 % (ref 11.6–15.4)
WBC: 4.3 10*3/uL (ref 3.4–10.8)

## 2021-03-05 ENCOUNTER — Other Ambulatory Visit: Payer: Self-pay | Admitting: Nurse Practitioner

## 2021-03-05 DIAGNOSIS — Z1211 Encounter for screening for malignant neoplasm of colon: Secondary | ICD-10-CM

## 2021-03-08 ENCOUNTER — Telehealth: Payer: Self-pay

## 2021-03-08 NOTE — Telephone Encounter (Signed)
-----   Message from Claiborne Rigg, NP sent at 03/05/2021  9:15 PM EST ----- Kidney, liver function and electrolytes are normal.    CBC shows mild anemia. Referred to GI for colonoscopy.

## 2021-03-08 NOTE — Telephone Encounter (Signed)
Carl Clark, [place] # [id number] Pt given results from [name], NP on 03/05/21. Pt verbalized understanding. He is asking if a message or email can be sent to him with results or appts with address included so he can translate himself and we not have to use translator or he not answer when we call. Advised I will send this request over to provider.     Note ----- Message from Claiborne Rigg, NP sent at 03/05/2021  9:15 PM EST ----- Kidney, liver function and electrolytes are normal.     CBC shows mild anemia. Referred to GI for colonoscopy.

## 2021-03-08 NOTE — Telephone Encounter (Signed)
Advised Nurse Mabe of the the resources that [facility] has for patients. Text or email not available.  Patient needs to be set up with MyChart.

## 2021-03-08 NOTE — Telephone Encounter (Signed)
T returned call and no one answered from [facility].  Please call pt back.

## 2021-03-08 NOTE — Telephone Encounter (Signed)
Left message to return call to our office.  

## 2021-03-09 ENCOUNTER — Telehealth: Payer: Self-pay

## 2021-03-09 NOTE — Telephone Encounter (Signed)
Pt was informed of lab results.

## 2021-03-09 NOTE — Telephone Encounter (Signed)
-----   Message from Claiborne Rigg, NP sent at 03/05/2021  9:15 PM EST ----- Kidney, liver function and electrolytes are normal.    CBC shows mild anemia. Referred to GI for colonoscopy.

## 2021-04-06 NOTE — Progress Notes (Signed)
Cardiology Office Note:    Date:  04/08/2021   ID:  Carl Clark, DOB [birth year], MRN [id number]  PCP:  Gildardo Pounds, NP  [facility] Cardiologist:  Sherren Mocha, MD  [place] Electrophysiologist:  None   Chief Complaint: 15 months follow-up.  History of Present Illness:    Carl Clark is a 71 y.o. male with a hx of CAD s/p CABG  HTN, DM, HLD and syncope presents for follow up.   Hx of NSTEMI 08/2016 s/p CABG (LIMA-LAD, SVG-diagonal, SVG-OM). Also with hx of recurrent syncope, felt to be vasovagal. Seen by Richardson Dopp 08/2018>> recommended echo and monitor for syncope.    Echo 09/2018 showed normal LVEF without WM abnormality. No monitor.  No recurrent syncope since then.   Last seen by me November 2021.  Had atypical chest pain consistent with GERD.  No exertional chest discomfort.  Continued medical therapy.  Patient is here for follow-up with interpreter.  Denies chest pain, shortness of breath, dizziness, palpitation, orthopnea, PND, syncope, lower extremity edema or melena.  Reviewed recent blood work done by PCP.  He plays basketball/soccer on weekends without any problem.  Past Medical History:  Diagnosis Date   CAD (coronary artery disease) 03/16/2017   S/p NSTEMI 6/18 >> LHC: multivessel CAD >> s/p CABG // Echo 6/18: Mild LVH, EF 60-65, normal wall motion, grade 1 diastolic dysfunction, normal RV SF, PASP 36 (borderline pulmonary hypertension) // Pre-CABG Dopplers 09/13/16: Bilateral ICA 1-39   Echocardiogram    Echo 09/2018: EF 60-65, normal wall motion, RVSP 24.9   Family history of anesthesia complication    "never had anesthesia" (07/14/2012)   History of non-ST elevation myocardial infarction (NSTEMI) 09/12/2016   Treated with CABG   History of rectal bleeding    secondary to diverticulum affecting right hepatic flexure - 06/2012   Hypertension    Mild carotid artery disease (HCC)    a. 1-39% bilaterally preCABG in 2018.   Type II  diabetes mellitus [facility])     Past Surgical History:  Procedure Laterality Date   COLONOSCOPY Left 07/14/2012   Procedure: COLONOSCOPY;  Surgeon: Arta Silence, MD;  Location: [facility] ENDOSCOPY;  Service: Endoscopy;  Laterality: Left;   CORONARY ARTERY BYPASS GRAFT N/A 09/15/2016   Procedure: CORONARY ARTERY BYPASS GRAFTING (CABG) x 3, LIMA to LAD, SVG to DIAGONAL, SVG to OM2, USING LEFT MAMMARY ARTERY AND RIGHT GREATER SAPHENOUS VEIN HARVESTED ENDOSCOPICALLY;  Surgeon: Grace Isaac, MD;  Location: [place];  Service: Open Heart Surgery;  Laterality: N/A;   ESOPHAGOGASTRODUODENOSCOPY N/A 07/13/2012   Procedure: ESOPHAGOGASTRODUODENOSCOPY (EGD);  Surgeon: Arta Silence, MD;  [facility];  Service: Endoscopy;  Laterality: N/A;  pt in ED A8   LEFT HEART CATH AND CORONARY ANGIOGRAPHY N/A 09/12/2016   Procedure: Left Heart Cath and Coronary Angiography;  Surgeon: Sherren Mocha, MD;  Location: [place] CV LAB;  Service: Cardiovascular;  Laterality: N/A;   NO PAST SURGERIES     TEE WITHOUT CARDIOVERSION N/A 09/15/2016   Procedure: TRANSESOPHAGEAL ECHOCARDIOGRAM (TEE);  Surgeon: Grace Isaac, MD;  Location: [place];  Service: Open Heart Surgery;  Laterality: N/A;    Current Medications: Current Meds  Medication Sig   amLODipine (NORVASC) 5 MG tablet Take 1 tablet (5 mg total) by mouth daily. FOR BLOOD PRESSURE. Please fill as a 90 day supply   atorvastatin (LIPITOR) 80 MG tablet Take once daily at 6pm for cholesterol .   Blood Glucose Monitoring Suppl (ACCU-CHEK GUIDE ME) w/Device  KIT 1 kit by Does not apply route 3 (three) times daily. Use to check blood sugar 3 times daily. E11.9   fluticasone (FLONASE) 50 MCG/ACT nasal spray Place 2 sprays into both nostrils daily.   glucose blood (TRUE METRIX BLOOD GLUCOSE TEST) test strip Use as instructed to check blood sugar 3 times daily. E11.9.   hydrocortisone cream 0.5 % Apply 1 application topically daily. Apply to lips   insulin detemir  (LEVEMIR FLEXTOUCH) 100 UNIT/ML FlexPen Inject 28 Units into the skin 2 (two) times daily.   Insulin Pen Needle 32G X 4 MM MISC USE AS INSTRUCTED. INJECT INTO THE SKIN TWICE DAILY. PATIENT REQUESTS 6 MONTH SUPPLY   losartan (COZAAR) 100 MG tablet Take 1 tablet (100 mg total) by mouth daily. FOR BLOOD PRESSURE. Please fill as a 90 day supply   metFORMIN (GLUCOPHAGE) 1000 MG tablet Take 1 tablet (1,000 mg total) by mouth 2 (two) times daily with a meal. For DIABETES.   metoprolol tartrate (LOPRESSOR) 25 MG tablet Take 1 tablet (25 mg total) by mouth 2 (two) times daily. FOR BLOOD PRESSURE. Please fill as a 90 day supply   Misc. Devices MISC Please provide patient with an insurance approved QUAD CANE   nitroGLYCERIN (NITROSTAT) 0.4 MG SL tablet PLACE 1 TABLET (0.4 MG TOTAL) UNDER THE TONGUE EVERY 5 (FIVE) MINUTES AS NEEDED FOR CHEST PAIN.   pantoprazole (PROTONIX) 40 MG tablet TAKE 1 TABLET (40 MG TOTAL) BY MOUTH DAILY.   Probiotic, Lactobacillus, CAPS Take 1 tablet by mouth daily.   traZODone (DESYREL) 50 MG tablet Take 0.5-1 tablets (25-50 mg total) by mouth at bedtime. For SLEEP   triamcinolone (KENALOG) 0.025 % ointment Apply 1 application topically 2 (two) times daily. TO back of head     Allergies:   Victoza [liraglutide]   Social History   Socioeconomic History   Marital status: Widowed    Spouse name: Not on file   Number of children: Not on file   Years of education: Not on file   Highest education level: Not on file  Occupational History   Not on file  Tobacco Use   Smoking status: Never   Smokeless tobacco: Never  Vaping Use   Vaping Use: Never used  Substance and Sexual Activity   Alcohol use: No   Drug use: No   Sexual activity: Not Currently  Other Topics Concern   Not on file  Social History Narrative   Not on file   Social Determinants of Health   Financial Resource Strain: Not on file  Food Insecurity: Not on file  Transportation Needs: Not on file  Physical  Activity: Not on file  Stress: Not on file  Social Connections: Not on file     Family History: The patient's family history includes Diabetes in his father and mother.    ROS:   Please see the history of present illness.    All other systems reviewed and are negative.   EKGs/Labs/Other Studies Reviewed:    The following studies were reviewed today:  Echocardiogram: 09/2018 1. The left ventricle has normal systolic function with an ejection fraction of 60-65%. The cavity size was normal. Left ventricular diastolic parameters were normal. No evidence of left ventricular regional wall motion abnormalities.  2. The right ventricle has normal systolic function. The cavity was normal. There is no increase in right ventricular wall thickness. Right ventricular systolic pressure is normal with an estimated pressure of 24.9 mmHg.    Left Heart Cath  and Coronary Angiography 08/2016  Conclusion   1. Severe proximal LAD stenosis  2. Severe proximal LCx stenosis 3. Mild-moderate RCA stenosis 4. Preserved LV systolic function with normal LVEF (>65%) and normal LVEDP   Pt essentially has left main equivalent with severe proximal/ostial LAD and LCx stenoses. Also has diffusely disease circumflex, OM, and diagonal branches. Recommend TCTS consult for CABG in setting of diabetes and severe multivessel CAD. Resume heparin post-cath.      OPERATIVE REPORT  09/2016   SURGICAL PROCEDURES:  Coronary artery bypass grafting x3 with left internal mammary to the left anterior descending coronary artery, reverse saphenous vein graft to the diagonal coronary artery, reverse saphenous vein graft to the second obtuse marginal with right thigh and calf greater saphenous vein harvesting endoscopically.   EKG:  EKG is ordered today.  The ekg ordered today demonstrates sinus bradycardia  Recent Labs: 03/01/2021: ALT 13; BUN 20; Creatinine, Ser 1.27; Hemoglobin 12.5; Platelets 288; Potassium 4.5; Sodium 137   Recent Lipid Panel    Component Value Date/Time   CHOL 104 08/24/2020 0926   TRIG 146 08/24/2020 0926   HDL 33 (L) 08/24/2020 0926   CHOLHDL 3.2 08/24/2020 0926   CHOLHDL 4.8 09/12/2016 0806   VLDL 29 09/12/2016 0806   LDLCALC 46 08/24/2020 0926     Risk Assessment/Calculations:       Physical Exam:    VS:  BP (!) 150/82 (BP Location: Left Arm, Patient Position: Sitting, Cuff Size: Normal)    Pulse (!) 54    Ht _0  (1.651 m)    Wt 148 lb 6.4 oz (67.3 kg)    SpO2 98%    BMI 24.70 kg/m     Wt Readings from Last 3 Encounters:  04/08/21 148 lb 6.4 oz (67.3 kg)  03/01/21 144 lb 8 oz (65.5 kg)  11/30/20 140 lb 6 oz (63.7 kg)     GEN:  Well nourished, well developed in no acute distress HEENT: Normal NECK: No JVD; No carotid bruits LYMPHATICS: No lymphadenopathy CARDIAC: RRR, no murmurs, rubs, gallops RESPIRATORY:  Clear to auscultation without rales, wheezing or rhonchi  ABDOMEN: Soft, non-tender, non-distended MUSCULOSKELETAL:  No edema; No deformity  SKIN: Warm and dry NEUROLOGIC:  Alert and oriented x 3 PSYCHIATRIC:  Normal affect   ASSESSMENT AND PLAN:    CAD status post CABG in 2018 -No angina.  Continue aspirin,  Lopressor, Lipitor and losartan.  2. HTN Blood pressure is elevated today but has not took any medication prior to arrival.  Continue amlodipine, losartan and metoprolol.  No change in therapy.  Recommended to continue monitor blood pressure.   3.  Hyperlipidemia -Continue statin. -08/24/2020: Cholesterol, Total 104; HDL 33; LDL Chol Calc (NIH) 46; Triglycerides 146   4. History of recurrent syncope -Felt to be vasovagal.  Echocardiogram with normal LV function without wall motion abnormality.  Did not got monitor.   -No recurrent episode  5 DM - Per PCP  Medication Adjustments/Labs and Tests Ordered: Current medicines are reviewed at length with the patient today.  Concerns regarding medicines are outlined above.  Orders Placed This Encounter   Procedures   EKG 12-Lead   No orders of the defined types were placed in this encounter.   Patient Instructions  Medication Instructions:    Your physician recommends that you continue on your current medications as directed. Please refer to the Current Medication list given to you today.  *If you need a refill on your cardiac medications before your  next appointment, please call your pharmacy*   Lab Work: NONE ORDERED  TODAY    If you have labs (blood work) drawn today and your tests are completely normal, you will receive your results only by: [place] (if you have MyChart) OR A paper copy in the mail If you have any lab test that is abnormal or we need to change your treatment, we will call you to review the results.   Testing/Procedures: NONE ORDERED  TODAY     Follow-Up: At [facility], you and your health needs are our priority.  As part of our continuing mission to provide you with exceptional heart care, we have created designated Provider Care Teams.  These Care Teams include your primary Cardiologist (physician) and Advanced Practice Providers (APPs -  Physician Assistants and Nurse Practitioners) who all work together to provide you with the care you need, when you need it.  We recommend signing up for the patient portal called "MyChart".  Sign up information is provided on this After Visit Summary.  MyChart is used to connect with patients for Virtual Visits (Telemedicine).  Patients are able to view lab/test results, encounter notes, upcoming appointments, etc.  Non-urgent messages can be sent to your provider as well.   To learn more about what you can do with MyChart, go to [email or web address].    Your next appointment:   1 year(s)  The format for your next appointment:   In Person  Provider:   Sherren Mocha, MD     Other Instructions    Signed, Leanor Kail, PA  04/08/2021 11:16 AM    [place]

## 2021-04-08 ENCOUNTER — Encounter: Payer: Self-pay | Admitting: Physician Assistant

## 2021-04-08 ENCOUNTER — Other Ambulatory Visit: Payer: Self-pay

## 2021-04-08 ENCOUNTER — Ambulatory Visit (INDEPENDENT_AMBULATORY_CARE_PROVIDER_SITE_OTHER): Payer: Medicare Other | Admitting: Physician Assistant

## 2021-04-08 VITALS — BP 150/82 | HR 54 | Ht 65.0 in | Wt 148.4 lb

## 2021-04-08 DIAGNOSIS — I10 Essential (primary) hypertension: Secondary | ICD-10-CM | POA: Diagnosis not present

## 2021-04-08 DIAGNOSIS — E782 Mixed hyperlipidemia: Secondary | ICD-10-CM

## 2021-04-08 DIAGNOSIS — I2510 Atherosclerotic heart disease of native coronary artery without angina pectoris: Secondary | ICD-10-CM

## 2021-04-08 DIAGNOSIS — E1165 Type 2 diabetes mellitus with hyperglycemia: Secondary | ICD-10-CM

## 2021-04-08 NOTE — Patient Instructions (Signed)
Medication Instructions:  ? ?Your physician recommends that you continue on your current medications as directed. Please refer to the Current Medication list given to you today. ? ?*If you need a refill on your cardiac medications before your next appointment, please call your pharmacy* ? ? ?Lab Work: NONE ORDERED  TODAY ? ? ?If you have labs (blood work) drawn today and your tests are completely normal, you will receive your results only by: ?MyChart Message (if you have MyChart) OR ?A paper copy in the mail ?If you have any lab test that is abnormal or we need to change your treatment, we will call you to review the results. ? ? ?Testing/Procedures: NONE ORDERED  TODAY ? ? ? ? ?Follow-Up: ?At [facility], you and your health needs are our priority.  As part of our continuing mission to provide you with exceptional heart care, we have created designated Provider Care Teams.  These Care Teams include your primary Cardiologist (physician) and Advanced Practice Providers (APPs -  Physician Assistants and Nurse Practitioners) who all work together to provide you with the care you need, when you need it. ? ?We recommend signing up for the patient portal called "MyChart".  Sign up information is provided on this After Visit Summary.  MyChart is used to connect with patients for Virtual Visits (Telemedicine).  Patients are able to view lab/test results, encounter notes, upcoming appointments, etc.  Non-urgent messages can be sent to your provider as well.   ?To learn more about what you can do with MyChart, go to [email or web address].   ? ?Your next appointment:   ?1 year(s) ? ?The format for your next appointment:   ?In Person ? ?Provider:   ?Michael Cooper, MD   ? ? ?Other Instructions ? ?

## 2021-04-13 ENCOUNTER — Ambulatory Visit: Payer: Medicare Other | Attending: Nurse Practitioner | Admitting: Pharmacist

## 2021-04-13 ENCOUNTER — Other Ambulatory Visit: Payer: Self-pay

## 2021-04-13 DIAGNOSIS — Z794 Long term (current) use of insulin: Secondary | ICD-10-CM

## 2021-04-13 DIAGNOSIS — E1165 Type 2 diabetes mellitus with hyperglycemia: Secondary | ICD-10-CM | POA: Diagnosis not present

## 2021-04-13 MED ORDER — TRULICITY 1.5 MG/0.5ML ~~LOC~~ SOAJ
1.5000 mg | SUBCUTANEOUS | 3 refills | Status: AC
  Filled 2021-04-13: qty 6, 84d supply, fill #0

## 2021-04-13 MED ORDER — EMPAGLIFLOZIN 10 MG PO TABS
10.0000 mg | ORAL_TABLET | Freq: Every day | ORAL | 2 refills | Status: AC
  Filled 2021-04-13: qty 90, 90d supply, fill #0

## 2021-04-13 NOTE — Progress Notes (Signed)
° ° °  S:    PCP: [name]  No chief complaint on file.  Patient arrives in good spirits.  Presents for diabetes management at the request of [name]. Patient was referred on 03/01/2021.  Today, pt reports his home sugar levels have been "unsteady". A1c was elevated at last appt. Upon review of charts, it looks like his Jardiance and Trulicity rxs expired. He tells me the Trulicity "finished". Tells me he did well while on both of these medications. Denies stopping d/t any side effects. He would like to restart these today. He remains compliant with his Levemir and metformin.  Family/Social History:  - FHx: DM (mother, father) - Tobacco: never smoker - Alcohol: reports occasional use   Insurance coverage/medication affordability:  -Medicare/[place] Medicaid  Patient denies adherence with medications.  Current diabetes medications include:  - Glipizide 5mg  BID - Levemir 28 units BID (taking 28u BID)  - Metformin 1000 mg BID  Patient denies hypoglycemic events.  Patient reported dietary habits:  - He does not limit carbohydrates  Patient-reported exercise habits:  - Does not exercise outside of work   Patient endorses polyuria. Denies polydipsia.  Patient denies neuropathy. Patient denies visual changes. Patient reports self foot exams.   O:  Home CBG ranges:  - Fasting: no meter today. Tells me his blood sugars are staying high.  Lab Results  Component Value Date   HGBA1C 12.5 (A) 03/01/2021   There were no vitals filed for this visit.  Lipid Panel     Component Value Date/Time   CHOL 104 08/24/2020 0926   TRIG 146 08/24/2020 0926   HDL 33 (L) 08/24/2020 0926   CHOLHDL 3.2 08/24/2020 0926   CHOLHDL 4.8 09/12/2016 0806   VLDL 29 09/12/2016 0806   LDLCALC 46 08/24/2020 0926   Clinical ASCVD: Yes - CAD, NSTEMI/CABG The ASCVD Risk score (Arnett DK, et al., 2019) failed to calculate for the following reasons:   The valid total cholesterol range is 130 to 320 mg/dL    A/P: Diabetes longstanding currently uncontrolled. Patient is able to verbalize appropriate hypoglycemia management plan. Patient is compliant with Levemir, metformin, and glipizide. Will stop glipizide and place him back on Jardiance and Trulicity for improved glycemic control and ASCVD benefit.  -Continue Levemir to 28 units BID. -Resume Trulicity 1.5 mg weekly.  -Resume Jardiance 10 mg daily. . -Stop glipizide.  -Continue metformin 1000 mg BID. -Extensively discussed pathophysiology of DM, recommended lifestyle interventions, dietary effects on glycemic control -Counseled on s/sx of and management of hypoglycemia -Next A1C anticipated 05/2021.  Written patient instructions provided.  Total time in face to face counseling 30 minutes.   Follow up w/ me next month.   06/2021, PharmD, Butch Penny, CPP Clinical Pharmacist [facility] [phone]

## 2021-05-19 ENCOUNTER — Encounter: Payer: Self-pay | Admitting: Nurse Practitioner

## 2021-05-19 DIAGNOSIS — Z20822 Contact with and (suspected) exposure to covid-19: Secondary | ICD-10-CM | POA: Diagnosis not present

## 2021-05-31 ENCOUNTER — Ambulatory Visit: Payer: Medicare Other | Admitting: Nurse Practitioner

## 2021-07-06 DIAGNOSIS — Z20822 Contact with and (suspected) exposure to covid-19: Secondary | ICD-10-CM | POA: Diagnosis not present

## 2021-08-06 ENCOUNTER — Other Ambulatory Visit: Payer: Self-pay

## 2021-08-06 ENCOUNTER — Encounter: Payer: Self-pay | Admitting: Nurse Practitioner

## 2021-08-06 ENCOUNTER — Ambulatory Visit: Payer: Medicare Other | Attending: Nurse Practitioner | Admitting: Nurse Practitioner

## 2021-08-06 VITALS — BP 160/82 | HR 57 | Ht 65.0 in | Wt 139.2 lb

## 2021-08-06 DIAGNOSIS — E785 Hyperlipidemia, unspecified: Secondary | ICD-10-CM

## 2021-08-06 DIAGNOSIS — I10 Essential (primary) hypertension: Secondary | ICD-10-CM

## 2021-08-06 DIAGNOSIS — E1165 Type 2 diabetes mellitus with hyperglycemia: Secondary | ICD-10-CM

## 2021-08-06 DIAGNOSIS — Z794 Long term (current) use of insulin: Secondary | ICD-10-CM | POA: Diagnosis not present

## 2021-08-06 DIAGNOSIS — D649 Anemia, unspecified: Secondary | ICD-10-CM | POA: Diagnosis not present

## 2021-08-06 DIAGNOSIS — I25119 Atherosclerotic heart disease of native coronary artery with unspecified angina pectoris: Secondary | ICD-10-CM | POA: Diagnosis not present

## 2021-08-06 LAB — POCT GLYCOSYLATED HEMOGLOBIN (HGB A1C): HbA1c, POC (controlled diabetic range): 6.8 % (ref 0.0–7.0)

## 2021-08-06 LAB — GLUCOSE, POCT (MANUAL RESULT ENTRY): POC Glucose: 188 mg/dl — AB (ref 70–99)

## 2021-08-06 MED ORDER — METFORMIN HCL 1000 MG PO TABS
1000.0000 mg | ORAL_TABLET | Freq: Two times a day (BID) | ORAL | 1 refills | Status: DC
  Filled 2021-08-06: qty 180, 90d supply, fill #0
  Filled 2021-11-23: qty 180, 90d supply, fill #1

## 2021-08-06 MED ORDER — INSULIN DETEMIR 100 UNIT/ML FLEXPEN
28.0000 [IU] | PEN_INJECTOR | Freq: Two times a day (BID) | SUBCUTANEOUS | 1 refills | Status: DC
  Filled 2021-08-06: qty 45, 80d supply, fill #0
  Filled 2021-11-19: qty 45, 80d supply, fill #1

## 2021-08-06 MED ORDER — TRUE METRIX BLOOD GLUCOSE TEST VI STRP
ORAL_STRIP | 3 refills | Status: DC
  Filled 2021-08-06: qty 100, 25d supply, fill #0

## 2021-08-06 MED ORDER — BLOOD PRESSURE MONITOR DEVI: 0 refills | Status: DC

## 2021-08-06 MED ORDER — ATORVASTATIN CALCIUM 80 MG PO TABS
ORAL_TABLET | ORAL | 3 refills | Status: DC
  Filled 2021-08-06: qty 90, 90d supply, fill #0

## 2021-08-06 MED ORDER — TRULICITY 1.5 MG/0.5ML ~~LOC~~ SOAJ
1.5000 mg | SUBCUTANEOUS | 1 refills | Status: DC
  Filled 2021-08-06: qty 6, 84d supply, fill #0
  Filled 2021-11-23: qty 6, 84d supply, fill #1

## 2021-08-06 MED ORDER — METOPROLOL TARTRATE 25 MG PO TABS
25.0000 mg | ORAL_TABLET | Freq: Two times a day (BID) | ORAL | 1 refills | Status: DC
  Filled 2021-08-06: qty 180, 90d supply, fill #0

## 2021-08-06 MED ORDER — AMLODIPINE BESYLATE 5 MG PO TABS
5.0000 mg | ORAL_TABLET | Freq: Every day | ORAL | 1 refills | Status: DC
  Filled 2021-08-06: qty 90, 90d supply, fill #0

## 2021-08-06 MED ORDER — VALSARTAN 160 MG PO TABS
160.0000 mg | ORAL_TABLET | Freq: Every day | ORAL | 1 refills | Status: DC
  Filled 2021-08-06 (×2): qty 90, 90d supply, fill #0

## 2021-08-06 NOTE — Progress Notes (Signed)
Assessment & Plan:  Temesgen was seen today for diabetes.  Diagnoses and all orders for this visit:  Type 2 diabetes mellitus with hyperglycemia, with long-term current use of insulin (HCC) -     POCT glucose (manual entry) -     POCT glycosylated hemoglobin (Hb A1C) -     Microalbumin / creatinine urine ratio -     glucose blood (TRUE METRIX BLOOD GLUCOSE TEST) test strip; use as directed -     metFORMIN (GLUCOPHAGE) 1000 MG tablet; Take 1 tablet (1,000 mg total) by mouth 2 (two) times daily with a meal. For DIABETES. -     Dulaglutide (TRULICITY) 1.5 ZF/5.8IP SOPN; Inject 1.5 mg into the skin once a week. FOR DIABETES -     insulin detemir (LEVEMIR) 100 UNIT/ML FlexPen; Inject 28 Units into the skin 2 (two) times daily. -     CMP14+EGFR  Atherosclerosis of native coronary artery of native heart with angina pectoris (HCC) -     atorvastatin (LIPITOR) 80 MG tablet; Take once daily at 6pm for cholesterol . -     Lipid panel  Essential hypertension -     amLODipine (NORVASC) 5 MG tablet; Take 1 tablet (5 mg total) by mouth daily. FOR BLOOD PRESSURE. Please fill as a 90 day supply -     Blood Pressure Monitor DEVI; Please provide patient with insurance approved blood pressure monitor -     metoprolol tartrate (LOPRESSOR) 25 MG tablet; Take 1 tablet (25 mg total) by mouth 2 (two) times daily. FOR BLOOD PRESSURE. Please fill as a 90 day supply -     valsartan (DIOVAN) 160 MG tablet; Take 1 tablet (160 mg total) by mouth daily. For blood pressure -     CMP14+EGFR  Dyslipidemia, goal LDL below 70 -     Lipid panel  Anemia, unspecified type -     CBC    Patient has been counseled on age-appropriate routine health concerns for screening and prevention. These are reviewed and up-to-date. Referrals have been placed accordingly. Immunizations are up-to-date or declined.    Subjective:   Chief Complaint  Patient presents with   Diabetes   HPI Carl Clark 71 y.o. male  presents to office today for follow up to HTN and DM.    Blood pressures not well controlled.  He has been out of his blood pressure medication for several days.  Will refill today and have him return for blood pressure check.  Discontinuing losartan 100 mg and starting him on valsartan 160 mg daily.  We will also continue on amlodipine 5 mg and metoprolol 25 mg twice daily. 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 BP Readings from Last 3 Encounters:  08/06/21 (!) 160/82  04/08/21 (!) 150/82  03/01/21 (!) 153/79    DM 2 Patient is adjusting levemir based on his blood glucose readings.at home. Diabetes is well controlled with Trulicity 1.5 mg weekly, Levemir 28 units twice daily and metformin 1000 mg twice daily.  He states that he is not taking Jardiance.  Lipids at goal with high intensity statin. Lab Results  Component Value Date   HGBA1C 6.8 08/06/2021   Lab Results  Component Value Date   LDLCALC 61 08/06/2021      Review of Systems  Constitutional:  Negative for fever, malaise/fatigue and weight loss.  HENT: Negative.  Negative for nosebleeds.   Eyes: Negative.  Negative for blurred vision,  double vision and photophobia.  Respiratory: Negative.  Negative for cough and shortness of breath.   Cardiovascular: Negative.  Negative for chest pain, palpitations and leg swelling.  Gastrointestinal: Negative.  Negative for heartburn, nausea and vomiting.  Musculoskeletal: Negative.  Negative for myalgias.  Neurological: Negative.  Negative for dizziness, focal weakness, seizures and headaches.  Psychiatric/Behavioral: Negative.  Negative for suicidal ideas.    Past Medical History:  Diagnosis Date   CAD (coronary artery disease) 03/16/2017   S/p NSTEMI 6/18 >> LHC: multivessel CAD >> s/p CABG // Echo 6/18: Mild LVH, EF 60-65, normal wall motion, grade 1 diastolic dysfunction, normal RV SF, PASP 36 (borderline pulmonary  hypertension) // Pre-CABG Dopplers 09/13/16: Bilateral ICA 1-39   Echocardiogram    Echo 09/2018: EF 60-65, normal wall motion, RVSP 24.9   Family history of anesthesia complication    "never had anesthesia" (07/14/2012)   History of non-ST elevation myocardial infarction (NSTEMI) 09/12/2016   Treated with CABG   History of rectal bleeding    secondary to diverticulum affecting right hepatic flexure - 06/2012   Hypertension    Mild carotid artery disease (HCC)    a. 1-39% bilaterally preCABG in 2018.   Type II diabetes mellitus [facility])     Past Surgical History:  Procedure Laterality Date   COLONOSCOPY Left 07/14/2012   Procedure: COLONOSCOPY;  Surgeon: Arta Silence, MD;  [facility] ENDOSCOPY;  Service: Endoscopy;  Laterality: Left;   CORONARY ARTERY BYPASS GRAFT N/A 09/15/2016   Procedure: CORONARY ARTERY BYPASS GRAFTING (CABG) x 3, LIMA to LAD, SVG to DIAGONAL, SVG to OM2, USING LEFT MAMMARY ARTERY AND RIGHT GREATER SAPHENOUS VEIN HARVESTED ENDOSCOPICALLY;  Surgeon: Grace Isaac, MD;  Location: [place];  Service: Open Heart Surgery;  Laterality: N/A;   ESOPHAGOGASTRODUODENOSCOPY N/A 07/13/2012   Procedure: ESOPHAGOGASTRODUODENOSCOPY (EGD);  Surgeon: Arta Silence, MD;  Location: [facility] ENDOSCOPY;  Service: Endoscopy;  Laterality: N/A;  pt in ED A8   LEFT HEART CATH AND CORONARY ANGIOGRAPHY N/A 09/12/2016   Procedure: Left Heart Cath and Coronary Angiography;  Surgeon: Sherren Mocha, MD;  Location: [place] CV LAB;  Service: Cardiovascular;  Laterality: N/A;   NO PAST SURGERIES     TEE WITHOUT CARDIOVERSION N/A 09/15/2016   Procedure: TRANSESOPHAGEAL ECHOCARDIOGRAM (TEE);  Surgeon: Grace Isaac, MD;  Location: [place];  Service: Open Heart Surgery;  Laterality: N/A;    Family History  Problem Relation Age of Onset   Diabetes Mother    Diabetes Father     Social History Reviewed with no changes to be made today.   Outpatient Medications Prior to Visit  Medication Sig Dispense  Refill   Blood Glucose Monitoring Suppl (ACCU-CHEK GUIDE ME) w/Device KIT 1 kit by Does not apply route 3 (three) times daily. Use to check blood sugar 3 times daily. E11.9 1 kit 0   fluticasone (FLONASE) 50 MCG/ACT nasal spray Place 2 sprays into both nostrils daily. 16 g 6   hydrocortisone cream 0.5 % Apply 1 application topically daily. Apply to lips 30 g 1   Insulin Pen Needle 32G X 4 MM MISC USE AS INSTRUCTED. INJECT INTO THE SKIN TWICE DAILY. PATIENT REQUESTS 6 MONTH SUPPLY 200 each 11   Misc. Devices MISC Please provide patient with an insurance approved QUAD CANE 1 each 0   nitroGLYCERIN (NITROSTAT) 0.4 MG SL tablet PLACE 1 TABLET (0.4 MG TOTAL) UNDER THE TONGUE EVERY 5 (FIVE) MINUTES AS NEEDED FOR CHEST PAIN. 90 tablet 1   traZODone (DESYREL)  50 MG tablet Take 0.5-1 tablets (25-50 mg total) by mouth at bedtime. For SLEEP 30 tablet 1   triamcinolone (KENALOG) 0.025 % ointment Apply 1 application topically 2 (two) times daily. TO back of head 60 g 1   atorvastatin (LIPITOR) 80 MG tablet Take once daily at 6pm for cholesterol . 90 tablet 3   glucose blood (TRUE METRIX BLOOD GLUCOSE TEST) test strip Use as instructed to check blood sugar 3 times daily. E11.9. 100 each 3   Probiotic, Lactobacillus, CAPS Take 1 tablet by mouth daily. 30 capsule 0   pantoprazole (PROTONIX) 40 MG tablet TAKE 1 TABLET (40 MG TOTAL) BY MOUTH DAILY. 90 tablet 1   amLODipine (NORVASC) 5 MG tablet Take 1 tablet (5 mg total) by mouth daily. FOR BLOOD PRESSURE. Please fill as a 90 day supply 90 tablet 3   insulin detemir (LEVEMIR FLEXTOUCH) 100 UNIT/ML FlexPen Inject 28 Units into the skin 2 (two) times daily. 50.4 mL 1   losartan (COZAAR) 100 MG tablet Take 1 tablet (100 mg total) by mouth daily. FOR BLOOD PRESSURE. Please fill as a 90 day supply 90 tablet 1   metFORMIN (GLUCOPHAGE) 1000 MG tablet Take 1 tablet (1,000 mg total) by mouth 2 (two) times daily with a meal. For DIABETES. 180 tablet 1   metoprolol tartrate  (LOPRESSOR) 25 MG tablet Take 1 tablet (25 mg total) by mouth 2 (two) times daily. FOR BLOOD PRESSURE. Please fill as a 90 day supply 180 tablet 1   No facility-administered medications prior to visit.    Allergies  Allergen Reactions   Victoza [Liraglutide] Swelling    Lip swelling, itching, rash       Objective:    BP (!) 160/82 (BP Location: Left Arm)   Pulse (!) 57   Ht '5\' 5"'  (1.651 m)   Wt 139 lb 3.2 oz (63.1 kg)   SpO2 100%   BMI 23.16 kg/m  Wt Readings from Last 3 Encounters:  08/06/21 139 lb 3.2 oz (63.1 kg)  04/08/21 148 lb 6.4 oz (67.3 kg)  03/01/21 144 lb 8 oz (65.5 kg)    Physical Exam Vitals and nursing note reviewed.  Constitutional:      Appearance: He is well-developed.  HENT:     Head: Normocephalic and atraumatic.  Cardiovascular:     Rate and Rhythm: Regular rhythm. Bradycardia present.     Heart sounds: Normal heart sounds. No murmur heard.   No friction rub. No gallop.  Pulmonary:     Effort: Pulmonary effort is normal. No tachypnea or respiratory distress.     Breath sounds: Normal breath sounds. No decreased breath sounds, wheezing, rhonchi or rales.  Chest:     Chest wall: No tenderness.  Abdominal:     General: Bowel sounds are normal.     Palpations: Abdomen is soft.  Musculoskeletal:        General: Normal range of motion.     Cervical back: Normal range of motion.  Skin:    General: Skin is warm and dry.  Neurological:     Mental Status: He is alert and oriented to person, place, and time.     Coordination: Coordination normal.  Psychiatric:        Behavior: Behavior normal. Behavior is cooperative.        Thought Content: Thought content normal.        Judgment: Judgment normal.         Patient has been counseled extensively about nutrition and  exercise as well as the importance of adherence with medications and regular follow-up. The patient was given clear instructions to go to ER or return to medical center if symptoms  don't improve, worsen or new problems develop. The patient verbalized understanding.   Follow-up: Return for 3weeks BP Check Carl Clark/BMP. See me in 3 months/HTN/A1c.   Carl Pounds, FNP-BC [facility] Benjamin Perez, [place]   08/13/2021, 8:47 AM

## 2021-08-06 NOTE — Progress Notes (Signed)
Needs medication refills.

## 2021-08-07 LAB — CMP14+EGFR
ALT: 17 IU/L (ref 0–44)
AST: 16 IU/L (ref 0–40)
Albumin/Globulin Ratio: 1.1 — ABNORMAL LOW (ref 1.2–2.2)
Albumin: 4.2 g/dL (ref 3.8–4.8)
Alkaline Phosphatase: 81 IU/L (ref 44–121)
BUN/Creatinine Ratio: 19 (ref 10–24)
BUN: 19 mg/dL (ref 8–27)
Bilirubin Total: 0.5 mg/dL (ref 0.0–1.2)
CO2: 24 mmol/L (ref 20–29)
Calcium: 9 mg/dL (ref 8.6–10.2)
Chloride: 104 mmol/L (ref 96–106)
Creatinine, Ser: 0.98 mg/dL (ref 0.76–1.27)
Globulin, Total: 3.9 g/dL (ref 1.5–4.5)
Glucose: 206 mg/dL — ABNORMAL HIGH (ref 70–99)
Potassium: 4.5 mmol/L (ref 3.5–5.2)
Sodium: 139 mmol/L (ref 134–144)
Total Protein: 8.1 g/dL (ref 6.0–8.5)
eGFR: 83 mL/min/{1.73_m2} (ref >59)

## 2021-08-07 LAB — CBC
Hematocrit: 34.5 % — ABNORMAL LOW (ref 37.5–51.0)
Hemoglobin: 11.5 g/dL — ABNORMAL LOW (ref 13.0–17.7)
MCH: 32.4 pg (ref 26.6–33.0)
MCHC: 33.3 g/dL (ref 31.5–35.7)
MCV: 97 fL (ref 79–97)
Platelets: 274 10*3/uL (ref 150–450)
RBC: 3.55 x10E6/uL — ABNORMAL LOW (ref 4.14–5.80)
RDW: 12.6 % (ref 11.6–15.4)
WBC: 3.9 10*3/uL (ref 3.4–10.8)

## 2021-08-07 LAB — MICROALBUMIN / CREATININE URINE RATIO
Creatinine, Urine: 134.6 mg/dL
Microalb/Creat Ratio: 16 mg/g creat (ref 0–29)
Microalbumin, Urine: 21 ug/mL

## 2021-08-07 LAB — LIPID PANEL
Chol/HDL Ratio: 3.5 ratio (ref 0.0–5.0)
Cholesterol, Total: 118 mg/dL (ref 100–199)
HDL: 34 mg/dL — ABNORMAL LOW (ref >39)
LDL Chol Calc (NIH): 61 mg/dL (ref 0–99)
Triglycerides: 127 mg/dL (ref 0–149)
VLDL Cholesterol Cal: 23 mg/dL (ref 5–40)

## 2021-08-09 ENCOUNTER — Other Ambulatory Visit: Payer: Self-pay

## 2021-08-13 ENCOUNTER — Encounter: Payer: Self-pay | Admitting: Nurse Practitioner

## 2021-08-18 ENCOUNTER — Encounter: Payer: Self-pay | Admitting: *Deleted

## 2021-08-18 NOTE — Progress Notes (Signed)
Left voicemail to return call    We are trying to contact you regarding the results from your recent visit. Please call our office at your convenience.   Sincerely,

## 2021-09-10 ENCOUNTER — Other Ambulatory Visit: Payer: Self-pay

## 2021-09-10 ENCOUNTER — Ambulatory Visit: Payer: Medicare Other | Attending: Nurse Practitioner | Admitting: Pharmacist

## 2021-09-10 DIAGNOSIS — Z79899 Other long term (current) drug therapy: Secondary | ICD-10-CM | POA: Diagnosis not present

## 2021-09-10 DIAGNOSIS — E119 Type 2 diabetes mellitus without complications: Secondary | ICD-10-CM | POA: Insufficient documentation

## 2021-09-10 DIAGNOSIS — Z951 Presence of aortocoronary bypass graft: Secondary | ICD-10-CM | POA: Diagnosis not present

## 2021-09-10 DIAGNOSIS — E785 Hyperlipidemia, unspecified: Secondary | ICD-10-CM | POA: Diagnosis not present

## 2021-09-10 DIAGNOSIS — I10 Essential (primary) hypertension: Secondary | ICD-10-CM | POA: Diagnosis not present

## 2021-09-10 DIAGNOSIS — Z833 Family history of diabetes mellitus: Secondary | ICD-10-CM | POA: Insufficient documentation

## 2021-09-10 DIAGNOSIS — R21 Rash and other nonspecific skin eruption: Secondary | ICD-10-CM | POA: Diagnosis not present

## 2021-09-10 DIAGNOSIS — I252 Old myocardial infarction: Secondary | ICD-10-CM | POA: Diagnosis not present

## 2021-09-10 DIAGNOSIS — I2510 Atherosclerotic heart disease of native coronary artery without angina pectoris: Secondary | ICD-10-CM | POA: Insufficient documentation

## 2021-09-10 MED ORDER — TRIAMCINOLONE ACETONIDE 0.025 % EX OINT
1.0000 | TOPICAL_OINTMENT | Freq: Two times a day (BID) | CUTANEOUS | 1 refills | Status: DC
  Filled 2021-09-10: qty 454, 90d supply, fill #0
  Filled 2022-01-12: qty 454, 90d supply, fill #1

## 2021-09-13 ENCOUNTER — Other Ambulatory Visit: Payer: Self-pay

## 2021-11-17 ENCOUNTER — Encounter: Payer: Self-pay | Admitting: Cardiovascular Disease

## 2021-11-17 ENCOUNTER — Ambulatory Visit: Payer: Medicare Other | Attending: Cardiovascular Disease | Admitting: Cardiovascular Disease

## 2021-11-17 ENCOUNTER — Other Ambulatory Visit: Payer: Self-pay

## 2021-11-17 VITALS — BP 122/70 | HR 55 | Ht 65.0 in | Wt 135.0 lb

## 2021-11-17 DIAGNOSIS — E782 Mixed hyperlipidemia: Secondary | ICD-10-CM | POA: Diagnosis not present

## 2021-11-17 DIAGNOSIS — E119 Type 2 diabetes mellitus without complications: Secondary | ICD-10-CM | POA: Diagnosis not present

## 2021-11-17 DIAGNOSIS — I10 Essential (primary) hypertension: Secondary | ICD-10-CM | POA: Insufficient documentation

## 2021-11-17 DIAGNOSIS — I25119 Atherosclerotic heart disease of native coronary artery with unspecified angina pectoris: Secondary | ICD-10-CM

## 2021-11-17 DIAGNOSIS — I2510 Atherosclerotic heart disease of native coronary artery without angina pectoris: Secondary | ICD-10-CM | POA: Diagnosis not present

## 2021-11-17 MED ORDER — AMLODIPINE BESYLATE 5 MG PO TABS
5.0000 mg | ORAL_TABLET | Freq: Every day | ORAL | 3 refills | Status: DC
  Filled 2021-11-17: qty 90, 90d supply, fill #0

## 2021-11-17 MED ORDER — ASPIRIN 81 MG PO TBEC: 81.0000 mg | DELAYED_RELEASE_TABLET | Freq: Every day | ORAL | 3 refills | Status: AC

## 2021-11-17 MED ORDER — NITROGLYCERIN 0.4 MG SL SUBL
SUBLINGUAL_TABLET | SUBLINGUAL | 6 refills | Status: AC
  Filled 2021-11-17: qty 25, 25d supply, fill #0
  Filled 2022-01-12: qty 25, 25d supply, fill #1
  Filled 2022-03-16: qty 25, 25d supply, fill #2

## 2021-11-17 MED ORDER — ATORVASTATIN CALCIUM 80 MG PO TABS
ORAL_TABLET | ORAL | 3 refills | Status: DC
  Filled 2021-11-17: qty 90, 90d supply, fill #0
  Filled 2022-03-16 – 2022-04-28 (×2): qty 90, 90d supply, fill #1

## 2021-11-17 MED ORDER — VALSARTAN 160 MG PO TABS
160.0000 mg | ORAL_TABLET | Freq: Every day | ORAL | 3 refills | Status: DC
  Filled 2021-11-17: qty 90, 90d supply, fill #0
  Filled 2022-03-16 – 2022-03-31 (×2): qty 90, 90d supply, fill #1
  Filled 2022-11-09: qty 60, 60d supply, fill #2

## 2021-11-17 MED ORDER — METOPROLOL TARTRATE 25 MG PO TABS
25.0000 mg | ORAL_TABLET | Freq: Two times a day (BID) | ORAL | 3 refills | Status: DC
  Filled 2021-11-17: qty 180, 90d supply, fill #0
  Filled 2022-03-16 – 2022-04-28 (×2): qty 180, 90d supply, fill #1

## 2021-11-17 NOTE — Progress Notes (Signed)
Cardiology Office Note:    Date:  11/23/2021   ID:  Carl Clark, DOB 1950-06-22, MRN [id number]  PCP:  Gildardo Pounds, NP   [place] [facility] Cardiologist:  Sherren Mocha, MD     Referring MD: Gildardo Pounds, NP   Chief Complaint  Patient presents with   Coronary Artery Disease    History of Present Illness:    Carl Clark is a 71 y.o. male with a hx of coronary artery disease, presenting for follow-up evaluation.  The patient initially presented in 2018 with non-STEMI and was found to have severe multivessel CAD ultimately treated with CABG involving a LIMA to LAD, saphenous vein graft to diagonal, and saphenous vein graft to obtuse marginal.  Other pertinent medical problems include hypertension, type 2 diabetes, mixed hyperlipidemia, and vasovagal syncope.  The patient is here with a translator today.  He reports that he is doing well from a cardiac perspective and denies chest pain, chest pressure, shortness of breath, leg swelling, orthopnea, heart palpitations, or PND.  He still works and reports that he does a lot of walking during the workday.  He has no functional limitation.  He needs refills on his medications today.  His noncardiac complaints include difficulty sleeping and pain in his fingers when he bends them.  He can only sleep for about 3 hours at a time.  He is going to see his primary care physician later this week and will discuss with them.  Past Medical History:  Diagnosis Date   CAD (coronary artery disease) 03/16/2017   S/p NSTEMI 6/18 >> LHC: multivessel CAD >> s/p CABG // Echo 6/18: Mild LVH, EF 60-65, normal wall motion, grade 1 diastolic dysfunction, normal RV SF, PASP 36 (borderline pulmonary hypertension) // Pre-CABG Dopplers 09/13/16: Bilateral ICA 1-39   Echocardiogram    Echo 09/2018: EF 60-65, normal wall motion, RVSP 24.9   Family history of anesthesia complication    "never had anesthesia" (07/14/2012)    History of non-ST elevation myocardial infarction (NSTEMI) 09/12/2016   Treated with CABG   History of rectal bleeding    secondary to diverticulum affecting right hepatic flexure - 06/2012   Hypertension    Mild carotid artery disease (HCC)    a. 1-39% bilaterally preCABG in 2018.   Type II diabetes mellitus [facility])     Past Surgical History:  Procedure Laterality Date   COLONOSCOPY Left 07/14/2012   Procedure: COLONOSCOPY;  Surgeon: Arta Silence, MD;  Location: [facility] ENDOSCOPY;  Service: Endoscopy;  Laterality: Left;   CORONARY ARTERY BYPASS GRAFT N/A 09/15/2016   Procedure: CORONARY ARTERY BYPASS GRAFTING (CABG) x 3, LIMA to LAD, SVG to DIAGONAL, SVG to OM2, USING LEFT MAMMARY ARTERY AND RIGHT GREATER SAPHENOUS VEIN HARVESTED ENDOSCOPICALLY;  Surgeon: Grace Isaac, MD;  Location: [place];  Service: Open Heart Surgery;  Laterality: N/A;   ESOPHAGOGASTRODUODENOSCOPY N/A 07/13/2012   Procedure: ESOPHAGOGASTRODUODENOSCOPY (EGD);  Surgeon: Arta Silence, MD;  Location: [facility] ENDOSCOPY;  Service: Endoscopy;  Laterality: N/A;  pt in ED A8   LEFT HEART CATH AND CORONARY ANGIOGRAPHY N/A 09/12/2016   Procedure: Left Heart Cath and Coronary Angiography;  Surgeon: Sherren Mocha, MD;  Location: [place] CV LAB;  Service: Cardiovascular;  Laterality: N/A;   NO PAST SURGERIES     TEE WITHOUT CARDIOVERSION N/A 09/15/2016   Procedure: TRANSESOPHAGEAL ECHOCARDIOGRAM (TEE);  Surgeon: Grace Isaac, MD;  Location: [place];  Service: Open Heart Surgery;  Laterality: N/A;    Current  Medications: Current Meds  Medication Sig   aspirin EC 81 MG tablet Take 1 tablet (81 mg total) by mouth daily. Swallow whole.   Blood Pressure Monitor DEVI Please provide patient with insurance approved blood pressure monitor   Dulaglutide (TRULICITY) 1.5 ZY/6.0YT SOPN Inject 1.5 mg into the skin once a week. FOR DIABETES   hydrocortisone cream 0.5 % Apply 1 application topically daily. Apply to lips   insulin detemir  (LEVEMIR) 100 UNIT/ML FlexPen Inject 28 Units into the skin 2 (two) times daily.   metFORMIN (GLUCOPHAGE) 1000 MG tablet Take 1 tablet (1,000 mg total) by mouth 2 (two) times daily with a meal. For DIABETES.   Misc. Devices MISC Please provide patient with an insurance approved QUAD CANE   [DISCONTINUED] amLODipine (NORVASC) 5 MG tablet Take 1 tablet (5 mg total) by mouth daily. FOR BLOOD PRESSURE. Please fill as a 90 day supply   [DISCONTINUED] atorvastatin (LIPITOR) 80 MG tablet Take once daily at 6pm for cholesterol .   [DISCONTINUED] Blood Glucose Monitoring Suppl (ACCU-CHEK GUIDE ME) w/Device KIT 1 kit by Does not apply route 3 (three) times daily. Use to check blood sugar 3 times daily. E11.9   [DISCONTINUED] glucose blood (TRUE METRIX BLOOD GLUCOSE TEST) test strip use as directed   [DISCONTINUED] metoprolol tartrate (LOPRESSOR) 25 MG tablet Take 1 tablet (25 mg total) by mouth 2 (two) times daily. FOR BLOOD PRESSURE. Please fill as a 90 day supply   [DISCONTINUED] nitroGLYCERIN (NITROSTAT) 0.4 MG SL tablet PLACE 1 TABLET (0.4 MG TOTAL) UNDER THE TONGUE EVERY 5 (FIVE) MINUTES AS NEEDED FOR CHEST PAIN.   [DISCONTINUED] valsartan (DIOVAN) 160 MG tablet Take 1 tablet (160 mg total) by mouth daily. For blood pressure     Allergies:   Victoza [liraglutide]   Social History   Socioeconomic History   Marital status: Widowed    Spouse name: Not on file   Number of children: Not on file   Years of education: Not on file   Highest education level: Not on file  Occupational History   Not on file  Tobacco Use   Smoking status: Never   Smokeless tobacco: Never  Vaping Use   Vaping Use: Never used  Substance and Sexual Activity   Alcohol use: No   Drug use: No   Sexual activity: Not Currently  Other Topics Concern   Not on file  Social History Narrative   Not on file   Social Determinants of Health   Financial Resource Strain: Not on file  Food Insecurity: Not on file   Transportation Needs: Not on file  Physical Activity: Not on file  Stress: Not on file  Social Connections: Not on file     Family History: The patient's family history includes Diabetes in his father and mother.  ROS:   Please see the history of present illness.    All other systems reviewed and are negative.  EKGs/Labs/Other Studies Reviewed:    The following studies were reviewed today: Echocardiogram 09/26/2018: 1. The left ventricle has normal systolic function with an ejection  fraction of 60-65%. The cavity size was normal. Left ventricular diastolic  parameters were normal. No evidence of left ventricular regional wall  motion abnormalities.   2. The right ventricle has normal systolic function. The cavity was  normal. There is no increase in right ventricular wall thickness. Right  ventricular systolic pressure is normal with an estimated pressure of 24.9  mmHg.   Cardiac Cath 09/12/2016: 1. Severe proximal  LAD stenosis  2. Severe proximal LCx stenosis 3. Mild-moderate RCA stenosis 4. Preserved LV systolic function with normal LVEF (>65%) and normal LVEDP   Pt essentially has left main equivalent with severe proximal/ostial LAD and LCx stenoses. Also has diffusely disease circumflex, OM, and diagonal branches. Recommend TCTS consult for CABG in setting of diabetes and severe multivessel CAD. Resume heparin post-cath.  EKG:  EKG is not ordered today.   Recent Labs: 11/19/2021: ALT 26; BUN 23; Creatinine, Ser 1.00; Hemoglobin 10.2; Platelets 261; Potassium 4.4; Sodium 138  Recent Lipid Panel    Component Value Date/Time   CHOL 118 08/06/2021 1051   TRIG 127 08/06/2021 1051   HDL 34 (L) 08/06/2021 1051   CHOLHDL 3.5 08/06/2021 1051   CHOLHDL 4.8 09/12/2016 0806   VLDL 29 09/12/2016 0806   LDLCALC 61 08/06/2021 1051     Risk Assessment/Calculations:             Physical Exam:    VS:  BP 122/70   Pulse (!) 55   Ht '5\' 5"'  (1.651 m)   Wt 135 lb (61.2 kg)    SpO2 99%   BMI 22.47 kg/m     Wt Readings from Last 3 Encounters:  11/19/21 137 lb (62.1 kg)  11/17/21 135 lb (61.2 kg)  08/06/21 139 lb 3.2 oz (63.1 kg)     GEN:  Well nourished, well developed in no acute distress HEENT: Normal NECK: No JVD; No carotid bruits LYMPHATICS: No lymphadenopathy CARDIAC: RRR, no murmurs, rubs, gallops RESPIRATORY:  Clear to auscultation without rales, wheezing or rhonchi  ABDOMEN: Soft, non-tender, non-distended MUSCULOSKELETAL:  No edema; No deformity  SKIN: Warm and dry NEUROLOGIC:  Alert and oriented x 3 PSYCHIATRIC:  Normal affect   ASSESSMENT:    1. Coronary artery disease involving native coronary artery of native heart without angina pectoris   2. Mixed hyperlipidemia   3. Essential hypertension   4. Type 2 diabetes mellitus without complication, without long-term current use of insulin (HCC) Chronic   PLAN:    In order of problems listed above:  The patient is stable with no symptoms of angina.  He will continue on his regimen of metoprolol, amlodipine, atorvastatin, and valsartan in the setting of type 2 diabetes and CAD.  We will verify that he is taking aspirin 81 mg daily which should be continued lifelong.  I will see him back in 1 year for follow-up evaluation. Treated with atorvastatin 80 mg daily.  Cholesterol 118, HDL 34, LDL 61. Blood pressure well controlled on amlodipine, metoprolol, and valsartan.  Continue the same regimen.  Most recent labs reviewed with creatinine 0.98 and potassium 4.5. Glycemic control improved, most recent hemoglobin A1c 6.8.           Medication Adjustments/Labs and Tests Ordered: Current medicines are reviewed at length with the patient today.  Concerns regarding medicines are outlined above.  No orders of the defined types were placed in this encounter.  Meds ordered this encounter  Medications   amLODipine (NORVASC) 5 MG tablet    Sig: Take 1 tablet (5 mg total) by mouth daily. FOR BLOOD  PRESSURE    Dispense:  90 tablet    Refill:  3   atorvastatin (LIPITOR) 80 MG tablet    Sig: Take once daily at 6pm for cholesterol .    Dispense:  90 tablet    Refill:  3   metoprolol tartrate (LOPRESSOR) 25 MG tablet    Sig: Take 1 tablet (  25 mg total) by mouth 2 (two) times daily. FOR BLOOD PRESSURE    Dispense:  180 tablet    Refill:  3   nitroGLYCERIN (NITROSTAT) 0.4 MG SL tablet    Sig: Dissolve 1 tablet under the tongue every 5 minutes as needed for chest pain. Max of 3 doses, then 911.    Dispense:  25 tablet    Refill:  6   valsartan (DIOVAN) 160 MG tablet    Sig: Take 1 tablet (160 mg total) by mouth daily. For blood pressure    Dispense:  90 tablet    Refill:  3   aspirin EC 81 MG tablet    Sig: Take 1 tablet (81 mg total) by mouth daily. Swallow whole.    Dispense:  90 tablet    Refill:  3    Patient Instructions  Medication Instructions:  REFILLED medications (90 days) *If you need a refill on your cardiac medications before your next appointment, please call your pharmacy*   Lab Work: NONE If you have labs (blood work) drawn today and your tests are completely normal, you will receive your results only by: [place] (if you have MyChart) OR A paper copy in the mail If you have any lab test that is abnormal or we need to change your treatment, we will call you to review the results.   Testing/Procedures: NONE   Follow-Up: At [facility], you and your health needs are our priority.  As part of our continuing mission to provide you with exceptional heart care, we have created designated Provider Care Teams.  These Care Teams include your primary Cardiologist (physician) and Advanced Practice Providers (APPs -  Physician Assistants and Nurse Practitioners) who all work together to provide you with the care you need, when you need it.  We recommend signing up for the patient portal called "MyChart".  Sign up information is provided on this After  Visit Summary.  MyChart is used to connect with patients for Virtual Visits (Telemedicine).  Patients are able to view lab/test results, encounter notes, upcoming appointments, etc.  Non-urgent messages can be sent to your provider as well.   To learn more about what you can do with MyChart, go to [email or web address].    Your next appointment:   1 year(s)  The format for your next appointment:   In Person  Provider:   Sherren Mocha, MD  or APP     Important Information About Sugar          Signed, Sherren Mocha, MD  11/23/2021 5:21 AM    [place]

## 2021-11-17 NOTE — Patient Instructions (Signed)
Medication Instructions:  REFILLED medications (90 days) *If you need a refill on your cardiac medications before your next appointment, please call your pharmacy*   Lab Work: NONE If you have labs (blood work) drawn today and your tests are completely normal, you will receive your results only by: MyChart Message (if you have MyChart) OR A paper copy in the mail If you have any lab test that is abnormal or we need to change your treatment, we will call you to review the results.   Testing/Procedures: NONE   Follow-Up: At [facility], you and your health needs are our priority.  As part of our continuing mission to provide you with exceptional heart care, we have created designated Provider Care Teams.  These Care Teams include your primary Cardiologist (physician) and Advanced Practice Providers (APPs -  Physician Assistants and Nurse Practitioners) who all work together to provide you with the care you need, when you need it.  We recommend signing up for the patient portal called "MyChart".  Sign up information is provided on this After Visit Summary.  MyChart is used to connect with patients for Virtual Visits (Telemedicine).  Patients are able to view lab/test results, encounter notes, upcoming appointments, etc.  Non-urgent messages can be sent to your provider as well.   To learn more about what you can do with MyChart, go to [email or web address].    Your next appointment:   1 year(s)  The format for your next appointment:   In Person  Provider:   Tonny Bollman, MD  or APP     Important Information About Sugar

## 2021-11-18 ENCOUNTER — Other Ambulatory Visit: Payer: Self-pay

## 2021-11-19 ENCOUNTER — Ambulatory Visit: Payer: Medicare Other | Attending: Nurse Practitioner | Admitting: Nurse Practitioner

## 2021-11-19 ENCOUNTER — Other Ambulatory Visit: Payer: Self-pay

## 2021-11-19 ENCOUNTER — Encounter: Payer: Self-pay | Admitting: Nurse Practitioner

## 2021-11-19 VITALS — BP 138/75 | HR 60 | Temp 98.1°F | Ht 65.0 in | Wt 137.0 lb

## 2021-11-19 DIAGNOSIS — Z794 Long term (current) use of insulin: Secondary | ICD-10-CM | POA: Insufficient documentation

## 2021-11-19 DIAGNOSIS — I10 Essential (primary) hypertension: Secondary | ICD-10-CM | POA: Insufficient documentation

## 2021-11-19 DIAGNOSIS — K219 Gastro-esophageal reflux disease without esophagitis: Secondary | ICD-10-CM | POA: Insufficient documentation

## 2021-11-19 DIAGNOSIS — K30 Functional dyspepsia: Secondary | ICD-10-CM | POA: Insufficient documentation

## 2021-11-19 DIAGNOSIS — Z23 Encounter for immunization: Secondary | ICD-10-CM | POA: Diagnosis not present

## 2021-11-19 DIAGNOSIS — E1165 Type 2 diabetes mellitus with hyperglycemia: Secondary | ICD-10-CM | POA: Insufficient documentation

## 2021-11-19 DIAGNOSIS — Z7984 Long term (current) use of oral hypoglycemic drugs: Secondary | ICD-10-CM | POA: Insufficient documentation

## 2021-11-19 DIAGNOSIS — M25561 Pain in right knee: Secondary | ICD-10-CM

## 2021-11-19 DIAGNOSIS — D649 Anemia, unspecified: Secondary | ICD-10-CM | POA: Diagnosis not present

## 2021-11-19 DIAGNOSIS — E119 Type 2 diabetes mellitus without complications: Secondary | ICD-10-CM | POA: Diagnosis present

## 2021-11-19 DIAGNOSIS — Z79899 Other long term (current) drug therapy: Secondary | ICD-10-CM | POA: Insufficient documentation

## 2021-11-19 DIAGNOSIS — G8929 Other chronic pain: Secondary | ICD-10-CM | POA: Insufficient documentation

## 2021-11-19 DIAGNOSIS — I252 Old myocardial infarction: Secondary | ICD-10-CM | POA: Diagnosis not present

## 2021-11-19 DIAGNOSIS — I2510 Atherosclerotic heart disease of native coronary artery without angina pectoris: Secondary | ICD-10-CM | POA: Diagnosis not present

## 2021-11-19 LAB — POCT GLYCOSYLATED HEMOGLOBIN (HGB A1C): Hemoglobin A1C: 7.1 % — AB (ref 4.0–5.6)

## 2021-11-19 LAB — GLUCOSE, POCT (MANUAL RESULT ENTRY): POC Glucose: 225 mg/dl — AB (ref 70–99)

## 2021-11-19 MED ORDER — ACCU-CHEK GUIDE ME W/DEVICE KIT
1.0000 | PACK | Freq: Three times a day (TID) | 0 refills | Status: DC
  Filled 2021-11-19: qty 1, 30d supply, fill #0

## 2021-11-19 MED ORDER — ACCU-CHEK SOFTCLIX LANCETS MISC
3 refills | Status: DC
  Filled 2021-11-19: qty 100, fill #0

## 2021-11-19 MED ORDER — PANTOPRAZOLE SODIUM 40 MG PO TBEC
DELAYED_RELEASE_TABLET | Freq: Every day | ORAL | 3 refills | Status: DC
  Filled 2021-11-19: qty 30, 30d supply, fill #0
  Filled 2022-01-12: qty 30, 30d supply, fill #1

## 2021-11-19 MED ORDER — DICLOFENAC SODIUM 75 MG PO TBEC
75.0000 mg | DELAYED_RELEASE_TABLET | Freq: Two times a day (BID) | ORAL | 2 refills | Status: DC
  Filled 2021-11-19: qty 60, 30d supply, fill #0
  Filled 2022-01-12: qty 60, 30d supply, fill #1
  Filled 2022-02-22: qty 60, 30d supply, fill #2

## 2021-11-19 MED ORDER — ACCU-CHEK GUIDE VI STRP
ORAL_STRIP | 12 refills | Status: DC
  Filled 2021-11-19: qty 100, 50d supply, fill #0

## 2021-11-19 MED ORDER — PREGABALIN 75 MG PO CAPS
75.0000 mg | ORAL_CAPSULE | Freq: Two times a day (BID) | ORAL | 3 refills | Status: DC
  Filled 2021-11-19: qty 60, 30d supply, fill #0
  Filled 2022-01-12: qty 60, 30d supply, fill #1
  Filled 2022-02-22: qty 60, 30d supply, fill #2
  Filled 2022-04-28: qty 60, 30d supply, fill #3

## 2021-11-19 NOTE — Patient Instructions (Addendum)
PLEASE CALL TO SCHEDULE COLONOSCOPY [facility] Located in: [place] [address] Address: [address], [place], [place] [address] Phone: [phone]

## 2021-11-19 NOTE — Progress Notes (Signed)
Assessment & Plan:  Mearle was seen today for diabetes.  Diagnoses and all orders for this visit:  Primary hypertension -     CMP14+EGFR Continue all antihypertensives as prescribed.  Reminded to bring in blood pressure log for follow  up appointment.  RECOMMENDATIONS: DASH/Mediterranean Diets are healthier choices for HTN.    Type 2 diabetes mellitus with hyperglycemia, with long-term current use of insulin (HCC) -     POCT glucose (manual entry) -     POCT glycosylated hemoglobin (Hb A1C) -     CMP14+EGFR -     Blood Glucose Monitoring Suppl (ACCU-CHEK GUIDE ME) w/Device KIT; 1 kit by Does not apply route 3 (three) times daily. Use to check blood sugar 2 times daily. E11.9 -     Accu-Chek Softclix Lancets lancets; Use as instructed. Inject into the skin twice daily -     glucose blood (ACCU-CHEK GUIDE) test strip; Use as instructed. Check blood glucose by fingerstick twice per day. Continue blood sugar control as discussed in office today, low carbohydrate diet, and regular physical exercise as tolerated, 150 minutes per week (30 min each day, 5 days per week, or 50 min 3 days per week). Keep blood sugar logs with fasting goal of 90-130 mg/dl, post prandial (after you eat) less than 180.  For Hypoglycemia: BS <60 and Hyperglycemia BS >400; contact the clinic ASAP. Annual eye exams and foot exams are recommended.   Anemia, unspecified type -     CBC with Differential  GERD without esophagitis Resumed PPI due to current symptoms of bitter stomach with acid foods -     pantoprazole (PROTONIX) 40 MG tablet; TAKE 1 TABLET (40 MG TOTAL) BY MOUTH DAILY.  Chronic pain of right knee -     pregabalin (LYRICA) 75 MG capsule; Take 1 capsule (75 mg total) by mouth 2 (two) times daily. -     diclofenac (VOLTAREN) 75 MG EC tablet; Take 1 tablet (75 mg total) by mouth 2 (two) times daily.  Need for pneumococcal 20-valent conjugate vaccination -     Pneumococcal conjugate vaccine  20-valent    Patient has been counseled on age-appropriate routine health concerns for screening and prevention. These are reviewed and up-to-date. Referrals have been placed accordingly. Immunizations are up-to-date or declined.    Subjective:   Chief Complaint  Patient presents with   Diabetes   HPI Carl Clark 71 y.o. male presents to office for follow up of DM/HTN  VRI was used to communicate directly with patient for the entire encounter including providing detailed patient instructions.    He has a past medical history of CAD (03/16/2017), Echocardiogram, Family history of anesthesia complication, History WCHENI(7/78/2423),  rectal bleeding, Hypertension, HPL, vasovagal syncope, and Type II diabetes mellitus    He has anemia and has been referred to GI. Still has not followed up with them although they have reached out to him several times to schedule. He also notes stomach upset after eating certain acidic foods and greens. Will resume pantoprazole as he has not been taking.    HTN Currently prescribed valsartan 160 mg daily, amlodipine 5 mg and metoprolol 25 mg twice daily. Blood pressure is well controlled.  BP Readings from Last 3 Encounters:  11/19/21 138/75  11/17/21 122/70  09/10/21 104/65    DM 2 A1c has slightly  increased. He is currently prescribed levemir 28 units BID,  Trulicity 1.5 mg weekly,and metformin 1000 mg twice daily.   Lipids at goal with  high intensity statin. Lab Results  Component Value Date   HGBA1C 7.1 (A) 11/19/2021    Lab Results  Component Value Date   HGBA1C 6.8 08/06/2021    Lab Results  Component Value Date   LDLCALC 61 08/06/2021    Review of Systems  Constitutional:  Negative for fever, malaise/fatigue and weight loss.  HENT: Negative.  Negative for nosebleeds.   Eyes: Negative.  Negative for blurred vision, double vision and photophobia.  Respiratory: Negative.  Negative for cough and shortness of breath.    Cardiovascular: Negative.  Negative for chest pain, palpitations and leg swelling.  Gastrointestinal:  Positive for heartburn. Negative for nausea and vomiting.  Musculoskeletal:  Positive for joint pain (chronic knee pain). Negative for myalgias.  Neurological: Negative.  Negative for dizziness, focal weakness, seizures and headaches.  Psychiatric/Behavioral: Negative.  Negative for suicidal ideas.     Past Medical History:  Diagnosis Date   CAD (coronary artery disease) 03/16/2017   S/p NSTEMI 6/18 >> LHC: multivessel CAD >> s/p CABG // Echo 6/18: Mild LVH, EF 60-65, normal wall motion, grade 1 diastolic dysfunction, normal RV SF, PASP 36 (borderline pulmonary hypertension) // Pre-CABG Dopplers 09/13/16: Bilateral ICA 1-39   Echocardiogram    Echo 09/2018: EF 60-65, normal wall motion, RVSP 24.9   Family history of anesthesia complication    "never had anesthesia" (07/14/2012)   History of non-ST elevation myocardial infarction (NSTEMI) 09/12/2016   Treated with CABG   History of rectal bleeding    secondary to diverticulum affecting right hepatic flexure - 06/2012   Hypertension    Mild carotid artery disease (HCC)    a. 1-39% bilaterally preCABG in 2018.   Type II diabetes mellitus [facility])     Past Surgical History:  Procedure Laterality Date   COLONOSCOPY Left 07/14/2012   Procedure: COLONOSCOPY;  Surgeon: Arta Silence, MD;  Location: [facility] ENDOSCOPY;  Service: Endoscopy;  Laterality: Left;   CORONARY ARTERY BYPASS GRAFT N/A 09/15/2016   Procedure: CORONARY ARTERY BYPASS GRAFTING (CABG) x 3, LIMA to LAD, SVG to DIAGONAL, SVG to OM2, USING LEFT MAMMARY ARTERY AND RIGHT GREATER SAPHENOUS VEIN HARVESTED ENDOSCOPICALLY;  Surgeon: Grace Isaac, MD;  Location: [place];  Service: Open Heart Surgery;  Laterality: N/A;   ESOPHAGOGASTRODUODENOSCOPY N/A 07/13/2012   Procedure: ESOPHAGOGASTRODUODENOSCOPY (EGD);  Surgeon: Arta Silence, MD;  Location: [facility] ENDOSCOPY;  Service: Endoscopy;   Laterality: N/A;  pt in ED A8   LEFT HEART CATH AND CORONARY ANGIOGRAPHY N/A 09/12/2016   Procedure: Left Heart Cath and Coronary Angiography;  Surgeon: Sherren Mocha, MD;  Location: [place] CV LAB;  Service: Cardiovascular;  Laterality: N/A;   NO PAST SURGERIES     TEE WITHOUT CARDIOVERSION N/A 09/15/2016   Procedure: TRANSESOPHAGEAL ECHOCARDIOGRAM (TEE);  Surgeon: Grace Isaac, MD;  Location: [place];  Service: Open Heart Surgery;  Laterality: N/A;    Family History  Problem Relation Age of Onset   Diabetes Mother    Diabetes Father     Social History Reviewed with no changes to be made today.   Outpatient Medications Prior to Visit  Medication Sig Dispense Refill   amLODipine (NORVASC) 5 MG tablet Take 1 tablet (5 mg total) by mouth daily. FOR BLOOD PRESSURE 90 tablet 3   aspirin EC 81 MG tablet Take 1 tablet (81 mg total) by mouth daily. Swallow whole. 90 tablet 3   atorvastatin (LIPITOR) 80 MG tablet Take once daily at 6pm for cholesterol . 90 tablet 3  Blood Pressure Monitor DEVI Please provide patient with insurance approved blood pressure monitor 1 each 0   Dulaglutide (TRULICITY) 1.5 NG/2.9BM SOPN Inject 1.5 mg into the skin once a week. FOR DIABETES 6 mL 1   fluticasone (FLONASE) 50 MCG/ACT nasal spray Place 2 sprays into both nostrils daily. 16 g 6   hydrocortisone cream 0.5 % Apply 1 application topically daily. Apply to lips 30 g 1   insulin detemir (LEVEMIR) 100 UNIT/ML FlexPen Inject 28 Units into the skin 2 (two) times daily. 45 mL 1   metFORMIN (GLUCOPHAGE) 1000 MG tablet Take 1 tablet (1,000 mg total) by mouth 2 (two) times daily with a meal. For DIABETES. 180 tablet 1   metoprolol tartrate (LOPRESSOR) 25 MG tablet Take 1 tablet (25 mg total) by mouth 2 (two) times daily. FOR BLOOD PRESSURE 180 tablet 3   Misc. Devices MISC Please provide patient with an insurance approved QUAD CANE 1 each 0   nitroGLYCERIN (NITROSTAT) 0.4 MG SL tablet Dissolve 1 tablet under  the tongue every 5 minutes as needed for chest pain. Max of 3 doses, then 911. 25 tablet 6   traZODone (DESYREL) 50 MG tablet Take 0.5-1 tablets (25-50 mg total) by mouth at bedtime. For SLEEP 30 tablet 1   triamcinolone (KENALOG) 0.025 % ointment Apply 1 Application topically 2 (two) times daily onto the back of the head 454 g 1   valsartan (DIOVAN) 160 MG tablet Take 1 tablet (160 mg total) by mouth daily. For blood pressure 90 tablet 3   Blood Glucose Monitoring Suppl (ACCU-CHEK GUIDE ME) w/Device KIT 1 kit by Does not apply route 3 (three) times daily. Use to check blood sugar 3 times daily. E11.9 1 kit 0   glucose blood (TRUE METRIX BLOOD GLUCOSE TEST) test strip use as directed 100 each 3   pantoprazole (PROTONIX) 40 MG tablet TAKE 1 TABLET (40 MG TOTAL) BY MOUTH DAILY. 90 tablet 1   No facility-administered medications prior to visit.    Allergies  Allergen Reactions   Victoza [Liraglutide] Swelling    Lip swelling, itching, rash       Objective:    BP 138/75   Pulse 60   Temp 98.1 F (36.7 C) (Oral)   Ht _0  (1.651 m)   Wt 137 lb (62.1 kg)   SpO2 99%   BMI 22.80 kg/m  Wt Readings from Last 3 Encounters:  11/19/21 137 lb (62.1 kg)  11/17/21 135 lb (61.2 kg)  08/06/21 139 lb 3.2 oz (63.1 kg)    Physical Exam Vitals and nursing note reviewed.  Constitutional:      Appearance: He is well-developed.  HENT:     Head: Normocephalic and atraumatic.  Cardiovascular:     Rate and Rhythm: Normal rate and regular rhythm.     Heart sounds: Normal heart sounds. No murmur heard.    No friction rub. No gallop.  Pulmonary:     Effort: Pulmonary effort is normal. No tachypnea or respiratory distress.     Breath sounds: Normal breath sounds. No decreased breath sounds, wheezing, rhonchi or rales.  Chest:     Chest wall: No tenderness.  Abdominal:     General: Bowel sounds are normal.     Palpations: Abdomen is soft.  Musculoskeletal:        General: Normal range of  motion.     Cervical back: Normal range of motion.  Skin:    General: Skin is warm and dry.  Neurological:  Mental Status: He is alert and oriented to person, place, and time.     Coordination: Coordination normal.  Psychiatric:        Behavior: Behavior normal. Behavior is cooperative.        Thought Content: Thought content normal.        Judgment: Judgment normal.          Patient has been counseled extensively about nutrition and exercise as well as the importance of adherence with medications and regular follow-up. The patient was given clear instructions to go to ER or return to medical center if symptoms don't improve, worsen or new problems develop. The patient verbalized understanding.   Follow-up: Return in about 3 months (around 02/18/2022) for DM/HTN.   Gildardo Pounds, FNP-BC [facility] and [place], [place]   11/19/2021, 8:55 PM

## 2021-11-20 LAB — CMP14+EGFR
ALT: 26 IU/L (ref 0–44)
AST: 21 IU/L (ref 0–40)
Albumin/Globulin Ratio: 1.1 — ABNORMAL LOW (ref 1.2–2.2)
Albumin: 3.8 g/dL — ABNORMAL LOW (ref 3.9–4.9)
Alkaline Phosphatase: 69 IU/L (ref 44–121)
BUN/Creatinine Ratio: 23 (ref 10–24)
BUN: 23 mg/dL (ref 8–27)
Bilirubin Total: 0.5 mg/dL (ref 0.0–1.2)
CO2: 22 mmol/L (ref 20–29)
Calcium: 8.6 mg/dL (ref 8.6–10.2)
Chloride: 104 mmol/L (ref 96–106)
Creatinine, Ser: 1 mg/dL (ref 0.76–1.27)
Globulin, Total: 3.5 g/dL (ref 1.5–4.5)
Glucose: 229 mg/dL — ABNORMAL HIGH (ref 70–99)
Potassium: 4.4 mmol/L (ref 3.5–5.2)
Sodium: 138 mmol/L (ref 134–144)
Total Protein: 7.3 g/dL (ref 6.0–8.5)
eGFR: 81 mL/min/{1.73_m2} (ref >59)

## 2021-11-20 LAB — CBC WITH DIFFERENTIAL/PLATELET
Basophils Absolute: 0 10*3/uL (ref 0.0–0.2)
Basos: 1 %
EOS (ABSOLUTE): 0.2 10*3/uL (ref 0.0–0.4)
Eos: 7 %
Hematocrit: 30.5 % — ABNORMAL LOW (ref 37.5–51.0)
Hemoglobin: 10.2 g/dL — ABNORMAL LOW (ref 13.0–17.7)
Immature Grans (Abs): 0 10*3/uL (ref 0.0–0.1)
Immature Granulocytes: 0 %
Lymphocytes Absolute: 1.2 10*3/uL (ref 0.7–3.1)
Lymphs: 35 %
MCH: 32.4 pg (ref 26.6–33.0)
MCHC: 33.4 g/dL (ref 31.5–35.7)
MCV: 97 fL (ref 79–97)
Monocytes Absolute: 0.5 10*3/uL (ref 0.1–0.9)
Monocytes: 14 %
Neutrophils Absolute: 1.5 10*3/uL (ref 1.4–7.0)
Neutrophils: 43 %
Platelets: 261 10*3/uL (ref 150–450)
RBC: 3.15 x10E6/uL — ABNORMAL LOW (ref 4.14–5.80)
RDW: 13.2 % (ref 11.6–15.4)
WBC: 3.3 10*3/uL — ABNORMAL LOW (ref 3.4–10.8)

## 2021-11-23 ENCOUNTER — Other Ambulatory Visit: Payer: Self-pay

## 2021-11-24 ENCOUNTER — Other Ambulatory Visit: Payer: Self-pay

## 2021-11-24 ENCOUNTER — Telehealth: Payer: Self-pay | Admitting: Emergency Medicine

## 2021-11-24 NOTE — Telephone Encounter (Signed)
Patient presents to office asking to discuss lab results.

## 2021-11-25 ENCOUNTER — Telehealth: Payer: Self-pay

## 2021-11-25 NOTE — Telephone Encounter (Signed)
Pt given lab results with Interpreter Arbutus Ped [phone] per notes of [name], NP on 11/25/21. Pt verbalized understanding. Pt was provided with # for scheduling colonoscopy.

## 2021-11-25 NOTE — Telephone Encounter (Signed)
Unable to reach patient by phone. Another voicemail left.

## 2022-01-12 ENCOUNTER — Other Ambulatory Visit: Payer: Self-pay

## 2022-01-13 ENCOUNTER — Other Ambulatory Visit: Payer: Self-pay

## 2022-02-02 ENCOUNTER — Ambulatory Visit (INDEPENDENT_AMBULATORY_CARE_PROVIDER_SITE_OTHER): Payer: Medicare Other | Admitting: Gastroenterology

## 2022-02-02 ENCOUNTER — Encounter: Payer: Self-pay | Admitting: Gastroenterology

## 2022-02-02 ENCOUNTER — Other Ambulatory Visit (INDEPENDENT_AMBULATORY_CARE_PROVIDER_SITE_OTHER): Payer: Self-pay

## 2022-02-02 ENCOUNTER — Other Ambulatory Visit: Payer: Self-pay

## 2022-02-02 VITALS — BP 146/88 | HR 81 | Ht 66.0 in | Wt 133.4 lb

## 2022-02-02 DIAGNOSIS — D509 Iron deficiency anemia, unspecified: Secondary | ICD-10-CM

## 2022-02-02 LAB — COMPREHENSIVE METABOLIC PANEL
ALT: 13 U/L (ref 0–53)
AST: 18 U/L (ref 0–37)
Albumin: 4 g/dL (ref 3.5–5.2)
Alkaline Phosphatase: 55 U/L (ref 39–117)
BUN: 19 mg/dL (ref 6–23)
CO2: 29 mEq/L (ref 19–32)
Calcium: 9.6 mg/dL (ref 8.4–10.5)
Chloride: 104 mEq/L (ref 96–112)
Creatinine, Ser: 0.91 mg/dL (ref 0.40–1.50)
GFR: 85.18 mL/min (ref >60.00)
Glucose, Bld: 108 mg/dL — ABNORMAL HIGH (ref 70–99)
Potassium: 3.9 mEq/L (ref 3.5–5.1)
Sodium: 138 mEq/L (ref 135–145)
Total Bilirubin: 0.6 mg/dL (ref 0.2–1.2)
Total Protein: 8.3 g/dL (ref 6.0–8.3)

## 2022-02-02 LAB — IBC + FERRITIN
Ferritin: 14.7 ng/mL — ABNORMAL LOW (ref 22.0–322.0)
Iron: 113 ug/dL (ref 42–165)
Saturation Ratios: 33.8 % (ref 20.0–50.0)
TIBC: 334.6 ug/dL (ref 250.0–450.0)
Transferrin: 239 mg/dL (ref 212.0–360.0)

## 2022-02-02 LAB — CBC WITH DIFFERENTIAL/PLATELET
Basophils Absolute: 0.1 10*3/uL (ref 0.0–0.1)
Basophils Relative: 1.7 % (ref 0.0–3.0)
Eosinophils Absolute: 0.3 10*3/uL (ref 0.0–0.7)
Eosinophils Relative: 6.7 % — ABNORMAL HIGH (ref 0.0–5.0)
HCT: 36 % — ABNORMAL LOW (ref 39.0–52.0)
Hemoglobin: 12.3 g/dL — ABNORMAL LOW (ref 13.0–17.0)
Lymphocytes Relative: 22.2 % (ref 12.0–46.0)
Lymphs Abs: 1 10*3/uL (ref 0.7–4.0)
MCHC: 34.2 g/dL (ref 30.0–36.0)
MCV: 95.3 fl (ref 78.0–100.0)
Monocytes Absolute: 0.4 10*3/uL (ref 0.1–1.0)
Monocytes Relative: 9.1 % (ref 3.0–12.0)
Neutro Abs: 2.6 10*3/uL (ref 1.4–7.7)
Neutrophils Relative %: 60.3 % (ref 43.0–77.0)
Platelets: 280 10*3/uL (ref 150.0–400.0)
RBC: 3.78 Mil/uL — ABNORMAL LOW (ref 4.22–5.81)
RDW: 13.1 % (ref 11.5–15.5)
WBC: 4.4 10*3/uL (ref 4.0–10.5)

## 2022-02-02 LAB — VITAMIN B12: Vitamin B-12: 368 pg/mL (ref 211–911)

## 2022-02-02 LAB — FOLATE: Folate: 23.6 ng/mL (ref >5.9)

## 2022-02-02 MED ORDER — OMEPRAZOLE 20 MG PO CPDR
20.0000 mg | DELAYED_RELEASE_CAPSULE | Freq: Every day | ORAL | 3 refills | Status: DC
  Filled 2022-02-02: qty 90, 90d supply, fill #0
  Filled 2022-03-16 – 2022-04-28 (×2): qty 90, 90d supply, fill #1

## 2022-02-02 NOTE — Progress Notes (Signed)
Chief Complaint: For GI evaluation  Referring Provider:  Gildardo Pounds, NP      ASSESSMENT AND PLAN;   #1. IDA (Hb 12.23 Feb 2011 to 10.2 11/2021)  #2. GERD.  Plan: -CBC, CMP, iron studies, B12, folate -Omeprazole 79m po QD #30 -hemoccult cards x 3. -EGD/colon -If neg eval and still with problems, rpt CT AP with contrast   I discussed EGD/Colonoscopy- the indications, risks, alternatives and potential complications including, but not limited to bleeding, infection, reaction to meds, damage to internal organs, cardiac and/or pulmonary problems, and perforation requiring surgery. The possibility that significant findings could be missed was explained. All ? were answered. Pt consents to proceed.  HPI:    Carl Mainwaringis a 71y.o. male  CAD s/p CABG (Nl EF 60-65% 09/2018), HTN, DM2, HLD Accompanied by the interpreter  With IDA. Hb 13 to 10 x 1 year. No overt bleeding.  Sent to GI clinic for further eval. Occ diarrhea No nausea, vomiting, heartburn, regurgitation, odynophagia or dysphagia.  No significant constipation.  No melena or hematochezia. No unintentional weight loss. No abdominal pain.  Had history of diverticular bleed in 2014 requiring colonoscopy/clipping.  No sodas, chocolates, chewing gums, artificial sweeteners and candy.   Has been  NSAIDs-diclofenac.  Was supposed to be on Protonix but has not been using it.   Past GI WU: CT AP 10/2020 1. Probable mild wall thickening of jejunal small bowel loops with diffuse wall thickening of the colon, suspicious for enterocolitis. 2. Other stable findings as previously described  Colon 06/2012 )[facility]): diverticulosis -diverticular bleed  EGD 06/2012: mild gastritis  Past Medical History:  Diagnosis Date   CAD (coronary artery disease) 03/16/2017   S/p NSTEMI 6/18 >> LHC: multivessel CAD >> s/p CABG // Echo 6/18: Mild LVH, EF 60-65, normal wall motion, grade 1 diastolic dysfunction, normal RV SF, PASP  36 (borderline pulmonary hypertension) // Pre-CABG Dopplers 09/13/16: Bilateral ICA 1-39   Echocardiogram    Echo 09/2018: EF 60-65, normal wall motion, RVSP 24.9   Family history of anesthesia complication    "never had anesthesia" (07/14/2012)   History of non-ST elevation myocardial infarction (NSTEMI) 09/12/2016   Treated with CABG   History of rectal bleeding    secondary to diverticulum affecting right hepatic flexure - 06/2012   Hypertension    Mild carotid artery disease (HCC)    a. 1-39% bilaterally preCABG in 2018.   Type II diabetes mellitus ([facility]     Past Surgical History:  Procedure Laterality Date   COLONOSCOPY Left 07/14/2012   Procedure: COLONOSCOPY;  Surgeon: WArta Silence MD;  [facility];  Service: Endoscopy;  Laterality: Left;   CORONARY ARTERY BYPASS GRAFT N/A 09/15/2016   Procedure: CORONARY ARTERY BYPASS GRAFTING (CABG) x 3, LIMA to LAD, SVG to DIAGONAL, SVG to OM2, USING LEFT MAMMARY ARTERY AND RIGHT GREATER SAPHENOUS VEIN HARVESTED ENDOSCOPICALLY;  Surgeon: GGrace Isaac MD;  [place]  Service: Open Heart Surgery;  Laterality: N/A;   ESOPHAGOGASTRODUODENOSCOPY N/A 07/13/2012   Procedure: ESOPHAGOGASTRODUODENOSCOPY (EGD);  Surgeon: WArta Silence MD;  [facility];  Service: Endoscopy;  Laterality: N/A;  pt in ED A8   LEFT HEART CATH AND CORONARY ANGIOGRAPHY N/A 09/12/2016   Procedure: Left Heart Cath and Coronary Angiography;  Surgeon: CSherren Mocha MD;  [place];  Service: Cardiovascular;  Laterality: N/A;   NO PAST SURGERIES     TEE WITHOUT CARDIOVERSION N/A 09/15/2016   Procedure: TRANSESOPHAGEAL  ECHOCARDIOGRAM (TEE);  Surgeon: Grace Isaac, MD;  Location: [place];  Service: Open Heart Surgery;  Laterality: N/A;    Family History  Problem Relation Age of Onset   Diabetes Mother    Diabetes Father     Social History   Tobacco Use   Smoking status: Never   Smokeless tobacco: Never  Vaping Use    Vaping Use: Never used  Substance Use Topics   Alcohol use: No   Drug use: No    Current Outpatient Medications  Medication Sig Dispense Refill   amLODipine (NORVASC) 5 MG tablet Take 1 tablet (5 mg total) by mouth daily. FOR BLOOD PRESSURE 90 tablet 3   aspirin EC 81 MG tablet Take 1 tablet (81 mg total) by mouth daily. Swallow whole. 90 tablet 3   Blood Glucose Monitoring Suppl (ACCU-CHEK GUIDE ME) w/Device KIT 1 kit by Does not apply route 3 (three) times daily. Use to check blood sugar 2 times daily. E11.9 1 kit 0   Blood Pressure Monitor DEVI Please provide patient with insurance approved blood pressure monitor 1 each 0   diclofenac (VOLTAREN) 75 MG EC tablet Take 1 tablet (75 mg total) by mouth 2 (two) times daily. 60 tablet 2   Dulaglutide (TRULICITY) 1.5 IO/9.7DZ SOPN Inject 1.5 mg into the skin once a week. FOR DIABETES 6 mL 1   insulin detemir (LEVEMIR) 100 UNIT/ML FlexPen Inject 28 Units into the skin 2 (two) times daily. 45 mL 1   metFORMIN (GLUCOPHAGE) 1000 MG tablet Take 1 tablet (1,000 mg total) by mouth 2 (two) times daily with a meal. For DIABETES. 180 tablet 1   metoprolol tartrate (LOPRESSOR) 25 MG tablet Take 1 tablet (25 mg total) by mouth 2 (two) times daily. FOR BLOOD PRESSURE 180 tablet 3   Misc. Devices MISC Please provide patient with an insurance approved QUAD CANE 1 each 0   nitroGLYCERIN (NITROSTAT) 0.4 MG SL tablet Dissolve 1 tablet under the tongue every 5 minutes as needed for chest pain. Max of 3 doses, then 911. 25 tablet 6         Accu-Chek Softclix Lancets lancets Use as instructed. Inject into the skin twice daily (Patient not taking: Reported on 02/02/2022) 100 each 3   atorvastatin (LIPITOR) 80 MG tablet Take once daily at 6pm for cholesterol . (Patient not taking: Reported on 02/02/2022) 90 tablet 3   fluticasone (FLONASE) 50 MCG/ACT nasal spray Place 2 sprays into both nostrils daily. (Patient not taking: Reported on 02/02/2022) 16 g 6   glucose blood  (ACCU-CHEK GUIDE) test strip Use as instructed. Check blood glucose by fingerstick twice per day. (Patient not taking: Reported on 02/02/2022) 100 each 12   hydrocortisone cream 0.5 % Apply 1 application topically daily. Apply to lips (Patient not taking: Reported on 02/02/2022) 30 g 1   pregabalin (LYRICA) 75 MG capsule Take 1 capsule (75 mg total) by mouth 2 (two) times daily. (Patient not taking: Reported on 02/02/2022) 60 capsule 3   traZODone (DESYREL) 50 MG tablet Take 0.5-1 tablets (25-50 mg total) by mouth at bedtime. For SLEEP (Patient not taking: Reported on 02/02/2022) 30 tablet 1   triamcinolone (KENALOG) 0.025 % ointment Apply 1 Application topically 2 (two) times daily onto the back of the head (Patient not taking: Reported on 02/02/2022) 454 g 1   valsartan (DIOVAN) 160 MG tablet Take 1 tablet (160 mg total) by mouth daily. For blood pressure (Patient not taking: Reported on 02/02/2022) 90 tablet 3  No current facility-administered medications for this visit.    Allergies  Allergen Reactions   Victoza [Liraglutide] Swelling    Lip swelling, itching, rash    Review of Systems:  Constitutional: Denies fever, chills, diaphoresis, appetite change and fatigue.  HEENT: Denies photophobia, eye pain, redness, hearing loss, ear pain, congestion, sore throat, rhinorrhea, sneezing, mouth sores, neck pain, neck stiffness and tinnitus.   Respiratory: Denies SOB, DOE, cough, chest tightness,  and wheezing.   Cardiovascular: Denies chest pain, palpitations and leg swelling.  Genitourinary: Denies dysuria, urgency, frequency, hematuria, flank pain and difficulty urinating.  Musculoskeletal: Denies myalgias, back pain, joint swelling, arthralgias and gait problem.  Has arthritis. Skin: No rash.  Neurological: Denies dizziness, seizures, syncope, weakness, light-headedness, numbness and headaches.  Hematological: Denies adenopathy. Easy bruising, personal or family bleeding history   Psychiatric/Behavioral: No anxiety or depression     Physical Exam:    BP (!) 146/88   Pulse 81   Ht 5' 6" (1.676 m)   Wt 133 lb 6.4 oz (60.5 kg)   SpO2 98%   BMI 21.53 kg/m  Wt Readings from Last 3 Encounters:  02/02/22 133 lb 6.4 oz (60.5 kg)  11/19/21 137 lb (62.1 kg)  11/17/21 135 lb (61.2 kg)   Constitutional:  Well-developed, in no acute distress. Psychiatric: Normal mood and affect. Behavior is normal. HEENT: Pupils normal.  Conjunctivae are normal. No scleral icterus. Neck supple.  Cardiovascular: Normal rate, regular rhythm. No edema Pulmonary/chest: Effort normal and breath sounds normal. No wheezing, rales or rhonchi. Abdominal: Soft, nondistended. Nontender. Bowel sounds active throughout. There are no masses palpable. No hepatomegaly. Rectal: Deferred -he wants to get it done at the time of colonoscopy Neurological: Alert and oriented to person place and time. Skin: Skin is warm and dry. No rashes noted.  Data Reviewed: I have personally reviewed following labs and imaging studies  CBC:    Latest Ref Rng & Units 11/19/2021    9:54 AM 08/06/2021   10:51 AM 03/01/2021   10:16 AM  CBC  WBC 3.4 - 10.8 x10E3/uL 3.3  3.9  4.3   Hemoglobin 13.0 - 17.7 g/dL 10.2  11.5  12.5   Hematocrit 37.5 - 51.0 % 30.5  34.5  38.2   Platelets 150 - 450 x10E3/uL 261  274  288     CMP:    Latest Ref Rng & Units 11/19/2021    9:54 AM 08/06/2021   10:51 AM 03/01/2021   10:16 AM  CMP  Glucose 70 - 99 mg/dL 229  206  254   BUN 8 - 27 mg/dL 23  19  20   Creatinine 0.76 - 1.27 mg/dL 1.00  0.98  1.27   Sodium 134 - 144 mmol/L 138  139  137   Potassium 3.5 - 5.2 mmol/L 4.4  4.5  4.5   Chloride 96 - 106 mmol/L 104  104  100   CO2 20 - 29 mmol/L 22  24  26   Calcium 8.6 - 10.2 mg/dL 8.6  9.0  9.6   Total Protein 6.0 - 8.5 g/dL 7.3  8.1  8.2   Total Bilirubin 0.0 - 1.2 mg/dL 0.5  0.5  0.5   Alkaline Phos 44 - 121 IU/L 69  81  110   AST 0 - 40 IU/L 21  16  16   ALT 0 - 44 IU/L 26   17  13        Raj Gupta, MD 02/02/2022, 9:18 AM    Cc: Fleming, [name] W, NP   

## 2022-02-02 NOTE — Patient Instructions (Addendum)
_______________________________________________________  If you are age 70 or older, your body mass index should be between 23-30. Your Body mass index is 21.53 kg/m. If this is out of the aforementioned range listed, please consider follow up with your Primary Care Provider.  If you are age 71 or younger, your body mass index should be between 19-25. Your Body mass index is 21.53 kg/m. If this is out of the aformentioned range listed, please consider follow up with your Primary Care Provider.   ________________________________________________________  The [place] [facility] providers would like to encourage you to use [facility] to communicate with providers for non-urgent requests or questions.  Due to long hold times on the telephone, sending your provider a message by [facility] may be a faster and more efficient way to get a response.  Please allow 48 business hours for a response.  Please remember that this is for non-urgent requests.  _______________________________________________________  Your provider has requested that you go to the basement level for lab work before leaving today. Press "B" on the elevator. The lab is located at the first door on the left as you exit the elevator.  Please drop hemoccult cards back to [address] [facility] at the basement level of the building of the lab  You have been scheduled for an endoscopy and colonoscopy. Please follow the written instructions given to you at your visit today. Please pick up your prep supplies at the pharmacy within the next 1-3 days. If you use inhalers (even only as needed), please bring them with you on the day of your procedure.  We have sent the following medications to your pharmacy for you to pick up at your convenience: Omeprazole 20 daily   Please call with any questions or concerns.  Thank you,  Dr. Lynann Bologna

## 2022-02-03 ENCOUNTER — Encounter: Payer: Self-pay | Admitting: Certified Registered Nurse Anesthetist

## 2022-02-09 ENCOUNTER — Telehealth: Payer: Self-pay

## 2022-02-09 ENCOUNTER — Other Ambulatory Visit (INDEPENDENT_AMBULATORY_CARE_PROVIDER_SITE_OTHER): Payer: Self-pay

## 2022-02-09 ENCOUNTER — Encounter: Payer: Self-pay | Admitting: Gastroenterology

## 2022-02-09 DIAGNOSIS — D509 Iron deficiency anemia, unspecified: Secondary | ICD-10-CM

## 2022-02-09 LAB — HEMOCCULT SLIDES (X 3 CARDS)
Fecal Occult Blood: NEGATIVE
OCCULT 3: NEGATIVE
OCCULT 4: NEGATIVE
OCCULT 5: NEGATIVE

## 2022-02-09 NOTE — Progress Notes (Deleted)
Patient arrived this morning for a scheduled endo/colon.  Patient did not have a care partner and had not done a colon prep.  The patient was seen in [facility] approximately a week ago.  Patient had instructions in english only and did not understand them.  Per policy this patient was rescheduled for his procedure to 04/20/22.  Instructions were provided in Arabic by using google translate.  Patient was grateful for the assistance and all questions were answered.

## 2022-02-09 NOTE — Telephone Encounter (Signed)
Ambulatory referral placed 

## 2022-02-21 ENCOUNTER — Ambulatory Visit: Payer: Self-pay | Attending: Nurse Practitioner | Admitting: Nurse Practitioner

## 2022-02-21 ENCOUNTER — Encounter: Payer: Self-pay | Admitting: Nurse Practitioner

## 2022-02-21 ENCOUNTER — Other Ambulatory Visit: Payer: Self-pay

## 2022-02-21 VITALS — BP 156/78 | HR 69 | Wt 131.2 lb

## 2022-02-21 DIAGNOSIS — Z794 Long term (current) use of insulin: Secondary | ICD-10-CM

## 2022-02-21 DIAGNOSIS — J40 Bronchitis, not specified as acute or chronic: Secondary | ICD-10-CM

## 2022-02-21 DIAGNOSIS — F5101 Primary insomnia: Secondary | ICD-10-CM

## 2022-02-21 DIAGNOSIS — E1165 Type 2 diabetes mellitus with hyperglycemia: Secondary | ICD-10-CM

## 2022-02-21 DIAGNOSIS — R21 Rash and other nonspecific skin eruption: Secondary | ICD-10-CM

## 2022-02-21 DIAGNOSIS — Z23 Encounter for immunization: Secondary | ICD-10-CM

## 2022-02-21 DIAGNOSIS — K5909 Other constipation: Secondary | ICD-10-CM

## 2022-02-21 DIAGNOSIS — I10 Essential (primary) hypertension: Secondary | ICD-10-CM

## 2022-02-21 LAB — POCT GLYCOSYLATED HEMOGLOBIN (HGB A1C): HbA1c, POC (controlled diabetic range): 6.6 % (ref 0.0–7.0)

## 2022-02-21 LAB — GLUCOSE, POCT (MANUAL RESULT ENTRY): POC Glucose: 120 mg/dl — AB (ref 70–99)

## 2022-02-21 MED ORDER — AMLODIPINE BESYLATE 10 MG PO TABS
10.0000 mg | ORAL_TABLET | Freq: Every day | ORAL | 1 refills | Status: DC
  Filled 2022-02-21: qty 90, 90d supply, fill #0
  Filled 2022-11-09: qty 90, 90d supply, fill #1

## 2022-02-21 MED ORDER — ACCU-CHEK SOFTCLIX LANCETS MISC
3 refills | Status: DC
  Filled 2022-02-21: qty 100, fill #0

## 2022-02-21 MED ORDER — TRIAMCINOLONE ACETONIDE 0.025 % EX OINT
1.0000 | TOPICAL_OINTMENT | Freq: Two times a day (BID) | CUTANEOUS | 1 refills | Status: AC
  Filled 2022-02-21: qty 454, 227d supply, fill #0
  Filled 2022-11-09: qty 454, 30d supply, fill #0
  Filled 2022-12-13: qty 454, 30d supply, fill #1

## 2022-02-21 MED ORDER — METFORMIN HCL 1000 MG PO TABS
1000.0000 mg | ORAL_TABLET | Freq: Two times a day (BID) | ORAL | 1 refills | Status: DC
  Filled 2022-02-21: qty 180, 90d supply, fill #0
  Filled 2022-11-14 (×2): qty 180, 90d supply, fill #1

## 2022-02-21 MED ORDER — TRAZODONE HCL 50 MG PO TABS
25.0000 mg | ORAL_TABLET | Freq: Every day | ORAL | 1 refills | Status: DC
  Filled 2022-02-21: qty 90, 90d supply, fill #0

## 2022-02-21 MED ORDER — ACCU-CHEK GUIDE ME W/DEVICE KIT
1.0000 | PACK | Freq: Two times a day (BID) | 0 refills | Status: DC
  Filled 2022-02-21: qty 1, 1d supply, fill #0
  Filled 2022-11-11: qty 1, 30d supply, fill #0

## 2022-02-21 MED ORDER — SENNOSIDES-DOCUSATE SODIUM 8.6-50 MG PO TABS
2.0000 | ORAL_TABLET | Freq: Every day | ORAL | 3 refills | Status: DC
  Filled 2022-02-21: qty 90, 45d supply, fill #0

## 2022-02-21 MED ORDER — DEXTROMETHORPHAN-GUAIFENESIN 10-100 MG/5ML PO LIQD
5.0000 mL | ORAL | 1 refills | Status: DC | PRN
  Filled 2022-02-21: qty 240, 8d supply, fill #0

## 2022-02-21 MED ORDER — INSULIN DETEMIR 100 UNIT/ML FLEXPEN
28.0000 [IU] | PEN_INJECTOR | Freq: Two times a day (BID) | SUBCUTANEOUS | 3 refills | Status: DC
  Filled 2022-02-21: qty 45, 80d supply, fill #0

## 2022-02-21 MED ORDER — TRULICITY 1.5 MG/0.5ML ~~LOC~~ SOAJ
1.5000 mg | SUBCUTANEOUS | 1 refills | Status: DC
  Filled 2022-02-21: qty 6, 84d supply, fill #0

## 2022-02-21 MED ORDER — AZITHROMYCIN 250 MG PO TABS
ORAL_TABLET | ORAL | 0 refills | Status: AC
  Filled 2022-02-21: qty 6, 5d supply, fill #0

## 2022-02-21 MED ORDER — ACCU-CHEK GUIDE VI STRP
ORAL_STRIP | 12 refills | Status: DC
  Filled 2022-02-21: qty 100, 50d supply, fill #0

## 2022-02-21 NOTE — Progress Notes (Signed)
Assessment & Plan:  [place] was seen today for hypertension, diabetes and medication refill.  Diagnoses and all orders for this visit:  Type 2 diabetes mellitus with hyperglycemia, with long-term current use of insulin (HCC) -     POCT glycosylated hemoglobin (Hb A1C) -     POCT glucose (manual entry) -     metFORMIN (GLUCOPHAGE) 1000 MG tablet; Take 1 tablet (1,000 mg total) by mouth 2 (two) times daily with a meal. For DIABETES. -     insulin detemir (LEVEMIR) 100 UNIT/ML FlexPen; Inject 28 Units into the skin 2 (two) times daily. INSULIN for diabetes -     Dulaglutide (TRULICITY) 1.5 GY/6.9SW SOPN; Inject 1.5 mg into the skin once a week. FOR DIABETES -     glucose blood (ACCU-CHEK GUIDE) test strip; Use as instructed. Check blood glucose by fingerstick twice per day. -     Accu-Chek Softclix Lancets lancets; Use as instructed. Inject into the skin twice daily -     Blood Glucose Monitoring Suppl (ACCU-CHEK GUIDE ME) w/Device KIT; Use to check blood sugar 2 times daily. E11.9 Continue blood sugar control as discussed in office today, low carbohydrate diet, and regular physical exercise as tolerated, 150 minutes per week (30 min each day, 5 days per week, or 50 min 3 days per week). Keep blood sugar logs with fasting goal of 90-130 mg/dl, post prandial (after you eat) less than 180.  For Hypoglycemia: BS <60 and Hyperglycemia BS >400; contact the clinic ASAP. Annual eye exams and foot exams are recommended.   Primary hypertension Increased amlodipine -     amLODipine (NORVASC) 10 MG tablet; Take 1 tablet (10 mg total) by mouth daily. FOR BLOOD PRESSURE Continue all antihypertensives as prescribed.  Reminded to bring in blood pressure log for follow  up appointment.  RECOMMENDATIONS: DASH/Mediterranean Diets are healthier choices for HTN.    Primary insomnia -     traZODone (DESYREL) 50 MG tablet; Take 0.5-1 tablets (25-50 mg total) by mouth at bedtime. For SLEEP  Skin rash -      triamcinolone (KENALOG) 0.025 % ointment; Apply 1 Application topically 2 (two) times daily onto the back of the head  Bronchitis -     azithromycin (ZITHROMAX) 250 MG tablet; Take 2 tablets on day 1, then 1 tablet daily on days 2 through 5. FOR cough -     CBC with Differential -     dextromethorphan-guaiFENesin (TUSSIN DM) 10-100 MG/5ML liquid; Take 5 mLs by mouth every 4 (four) hours as needed for cough.  Need for immunization against influenza -     Flu Vaccine QUAD High Dose(Fluad)  Chronic constipation -     senna-docusate (SENOKOT-S) 8.6-50 MG tablet; Take 2 tablets by mouth daily. FOR CONSTIPATION    Patient has been counseled on age-appropriate routine health concerns for screening and prevention. These are reviewed and up-to-date. Referrals have been placed accordingly. Immunizations are up-to-date or declined.    Subjective:   Chief Complaint  Patient presents with   Hypertension   Diabetes   Medication Refill   HPI Carl Clark 71 y.o. male presents to office today for follow up to DM and HTN  VRI was used to communicate directly with patient for the entire encounter including providing detailed patient instructions.     He has a past medical history of CAD (03/16/2017), Echocardiogram, Family history of anesthesia complication, History NIOEVO(3/50/0938),  rectal bleeding, Hypertension, HPL, vasovagal syncope, and Type II diabetes mellitus  DM Well controlled. A1c down from 7.1 to 6.6.  At this time he will continue on metformin 1000 mg twice daily, Trulicity 1.5 mg weekly and Levemir 28 units twice daily.  LDL at goal with high intensity statin. Lab Results  Component Value Date   HGBA1C 6.6 02/21/2022    Lab Results  Component Value Date   LDLCALC 61 08/06/2021     Bronchitis Notes productive cough over the past 3 weeks. Associated symptoms: fever, chills, increased mucous production and fatigue.   HTN Blood pressure not at goal. Will  increase amlodipine to 10 mg daily.  He will continue on Lopressor 25 mg twice daily and valsartan 160 mg daily. BP Readings from Last 3 Encounters:  02/21/22 (!) 156/78  02/02/22 (!) 146/88  11/19/21 138/75     Insomnia Notes difficulty falling asleep and staying asleep. Would like refill of trazodone.    Review of Systems  Constitutional:  Negative for fever, malaise/fatigue and weight loss.  HENT: Negative.  Negative for nosebleeds.   Eyes: Negative.  Negative for blurred vision, double vision and photophobia.  Respiratory:  Positive for cough and sputum production. Negative for hemoptysis, shortness of breath and wheezing.   Cardiovascular: Negative.  Negative for chest pain, palpitations and leg swelling.  Gastrointestinal:  Positive for constipation. Negative for abdominal pain, blood in stool, diarrhea, heartburn, melena, nausea and vomiting.  Musculoskeletal: Negative.  Negative for myalgias.  Neurological: Negative.  Negative for dizziness, focal weakness, seizures and headaches.  Psychiatric/Behavioral:  Negative for suicidal ideas. The patient has insomnia.     Past Medical History:  Diagnosis Date   CAD (coronary artery disease) 03/16/2017   S/p NSTEMI 6/18 >> LHC: multivessel CAD >> s/p CABG // Echo 6/18: Mild LVH, EF 60-65, normal wall motion, grade 1 diastolic dysfunction, normal RV SF, PASP 36 (borderline pulmonary hypertension) // Pre-CABG Dopplers 09/13/16: Bilateral ICA 1-39   Echocardiogram    Echo 09/2018: EF 60-65, normal wall motion, RVSP 24.9   Family history of anesthesia complication    "never had anesthesia" (07/14/2012)   History of non-ST elevation myocardial infarction (NSTEMI) 09/12/2016   Treated with CABG   History of rectal bleeding    secondary to diverticulum affecting right hepatic flexure - 06/2012   Hypertension    Mild carotid artery disease (HCC)    a. 1-39% bilaterally preCABG in 2018.   Type II diabetes mellitus [facility])     Past Surgical  History:  Procedure Laterality Date   COLONOSCOPY Left 07/14/2012   Procedure: COLONOSCOPY;  Surgeon: Arta Silence, MD;  Location: [facility] ENDOSCOPY;  Service: Endoscopy;  Laterality: Left;   CORONARY ARTERY BYPASS GRAFT N/A 09/15/2016   Procedure: CORONARY ARTERY BYPASS GRAFTING (CABG) x 3, LIMA to LAD, SVG to DIAGONAL, SVG to OM2, USING LEFT MAMMARY ARTERY AND RIGHT GREATER SAPHENOUS VEIN HARVESTED ENDOSCOPICALLY;  Surgeon: Grace Isaac, MD;  Location: [place];  Service: Open Heart Surgery;  Laterality: N/A;   ESOPHAGOGASTRODUODENOSCOPY N/A 07/13/2012   Procedure: ESOPHAGOGASTRODUODENOSCOPY (EGD);  Surgeon: Arta Silence, MD;  Location: [facility] ENDOSCOPY;  Service: Endoscopy;  Laterality: N/A;  pt in ED A8   LEFT HEART CATH AND CORONARY ANGIOGRAPHY N/A 09/12/2016   Procedure: Left Heart Cath and Coronary Angiography;  Surgeon: Sherren Mocha, MD;  Location: [place] CV LAB;  Service: Cardiovascular;  Laterality: N/A;   NO PAST SURGERIES     TEE WITHOUT CARDIOVERSION N/A 09/15/2016   Procedure: TRANSESOPHAGEAL ECHOCARDIOGRAM (TEE);  Surgeon: Grace Isaac, MD;  Location:  [place] OR;  Service: Open Heart Surgery;  Laterality: N/A;    Family History  Problem Relation Age of Onset   Diabetes Mother    Diabetes Father     Social History Reviewed with no changes to be made today.   Outpatient Medications Prior to Visit  Medication Sig Dispense Refill   aspirin EC 81 MG tablet Take 1 tablet (81 mg total) by mouth daily. Swallow whole. 90 tablet 3   atorvastatin (LIPITOR) 80 MG tablet Take once daily at 6pm for cholesterol . 90 tablet 3   Blood Pressure Monitor DEVI Please provide patient with insurance approved blood pressure monitor 1 each 0   diclofenac (VOLTAREN) 75 MG EC tablet Take 1 tablet (75 mg total) by mouth 2 (two) times daily. 60 tablet 2   fluticasone (FLONASE) 50 MCG/ACT nasal spray Place 2 sprays into both nostrils daily. 16 g 6   hydrocortisone cream 0.5 % Apply 1 application  topically daily. Apply to lips 30 g 1   metoprolol tartrate (LOPRESSOR) 25 MG tablet Take 1 tablet (25 mg total) by mouth 2 (two) times daily. FOR BLOOD PRESSURE 180 tablet 3   Misc. Devices MISC Please provide patient with an insurance approved QUAD CANE 1 each 0   nitroGLYCERIN (NITROSTAT) 0.4 MG SL tablet Dissolve 1 tablet under the tongue every 5 minutes as needed for chest pain. Max of 3 doses, then 911. 25 tablet 6   omeprazole (PRILOSEC) 20 MG capsule Take 1 capsule (20 mg total) by mouth daily. 90 capsule 3   pregabalin (LYRICA) 75 MG capsule Take 1 capsule (75 mg total) by mouth 2 (two) times daily. 60 capsule 3   valsartan (DIOVAN) 160 MG tablet Take 1 tablet (160 mg total) by mouth daily. For blood pressure 90 tablet 3   Accu-Chek Softclix Lancets lancets Use as instructed. Inject into the skin twice daily 100 each 3   amLODipine (NORVASC) 5 MG tablet Take 1 tablet (5 mg total) by mouth daily. FOR BLOOD PRESSURE 90 tablet 3   Blood Glucose Monitoring Suppl (ACCU-CHEK GUIDE ME) w/Device KIT 1 kit by Does not apply route 3 (three) times daily. Use to check blood sugar 2 times daily. E11.9 1 kit 0   Dulaglutide (TRULICITY) 1.5 UG/8.9VQ SOPN Inject 1.5 mg into the skin once a week. FOR DIABETES 6 mL 1   glucose blood (ACCU-CHEK GUIDE) test strip Use as instructed. Check blood glucose by fingerstick twice per day. 100 each 12   insulin detemir (LEVEMIR) 100 UNIT/ML FlexPen Inject 28 Units into the skin 2 (two) times daily. 45 mL 1   metFORMIN (GLUCOPHAGE) 1000 MG tablet Take 1 tablet (1,000 mg total) by mouth 2 (two) times daily with a meal. For DIABETES. 180 tablet 1   pantoprazole (PROTONIX) 40 MG tablet TAKE 1 TABLET (40 MG TOTAL) BY MOUTH DAILY. 90 tablet 3   traZODone (DESYREL) 50 MG tablet Take 0.5-1 tablets (25-50 mg total) by mouth at bedtime. For SLEEP 30 tablet 1   triamcinolone (KENALOG) 0.025 % ointment Apply 1 Application topically 2 (two) times daily onto the back of the head 454  g 1   No facility-administered medications prior to visit.    Allergies  Allergen Reactions   Victoza [Liraglutide] Swelling    Lip swelling, itching, rash       Objective:    BP (!) 156/78   Pulse 69   Wt 131 lb 3.2 oz (59.5 kg)   SpO2 98%   BMI  21.18 kg/m  Wt Readings from Last 3 Encounters:  02/21/22 131 lb 3.2 oz (59.5 kg)  02/02/22 133 lb 6.4 oz (60.5 kg)  11/19/21 137 lb (62.1 kg)    Physical Exam Vitals and nursing note reviewed.  Constitutional:      Appearance: He is well-developed.  HENT:     Head: Normocephalic and atraumatic.  Cardiovascular:     Rate and Rhythm: Normal rate and regular rhythm.     Heart sounds: Normal heart sounds. No murmur heard.    No friction rub. No gallop.  Pulmonary:     Effort: Pulmonary effort is normal. No tachypnea or respiratory distress.     Breath sounds: Normal breath sounds. No decreased breath sounds, wheezing, rhonchi or rales.  Chest:     Chest wall: No tenderness.  Abdominal:     General: Bowel sounds are normal.     Palpations: Abdomen is soft.  Musculoskeletal:        General: Normal range of motion.     Cervical back: Normal range of motion.  Skin:    General: Skin is warm and dry.  Neurological:     Mental Status: He is alert and oriented to person, place, and time.     Coordination: Coordination normal.  Psychiatric:        Behavior: Behavior normal. Behavior is cooperative.        Thought Content: Thought content normal.        Judgment: Judgment normal.          Patient has been counseled extensively about nutrition and exercise as well as the importance of adherence with medications and regular follow-up. The patient was given clear instructions to go to ER or return to medical center if symptoms don't improve, worsen or new problems develop. The patient verbalized understanding.   Follow-up: Return in about 3 months (around 05/23/2022).   Gildardo Pounds, FNP-BC [facility] and  [place], [place]   02/21/2022, 11:16 AM

## 2022-02-22 ENCOUNTER — Other Ambulatory Visit: Payer: Self-pay

## 2022-02-22 LAB — CBC WITH DIFFERENTIAL/PLATELET
Basophils Absolute: 0.1 10*3/uL (ref 0.0–0.2)
Basos: 1 %
EOS (ABSOLUTE): 0.4 10*3/uL (ref 0.0–0.4)
Eos: 8 %
Hematocrit: 37.8 % (ref 37.5–51.0)
Hemoglobin: 12.6 g/dL — ABNORMAL LOW (ref 13.0–17.7)
Immature Grans (Abs): 0 10*3/uL (ref 0.0–0.1)
Immature Granulocytes: 0 %
Lymphocytes Absolute: 1.6 10*3/uL (ref 0.7–3.1)
Lymphs: 29 %
MCH: 31.3 pg (ref 26.6–33.0)
MCHC: 33.3 g/dL (ref 31.5–35.7)
MCV: 94 fL (ref 79–97)
Monocytes Absolute: 0.5 10*3/uL (ref 0.1–0.9)
Monocytes: 9 %
Neutrophils Absolute: 3 10*3/uL (ref 1.4–7.0)
Neutrophils: 53 %
Platelets: 320 10*3/uL (ref 150–450)
RBC: 4.02 x10E6/uL — ABNORMAL LOW (ref 4.14–5.80)
RDW: 12.2 % (ref 11.6–15.4)
WBC: 5.8 10*3/uL (ref 3.4–10.8)

## 2022-02-23 ENCOUNTER — Other Ambulatory Visit: Payer: Self-pay

## 2022-03-16 ENCOUNTER — Other Ambulatory Visit: Payer: Self-pay

## 2022-03-17 ENCOUNTER — Other Ambulatory Visit: Payer: Self-pay

## 2022-03-17 ENCOUNTER — Telehealth: Payer: Self-pay | Admitting: Emergency Medicine

## 2022-03-17 NOTE — Telephone Encounter (Signed)
Copied from CRM [id number]. Topic: General - Other >> Mar 17, 2022 11:38 AM Tiffany B wrote: Carl Clark has assisted patient in the patient with gaining insurance. Patient would like to apply for [employer] [employer] finanical services and the orange card, please give the patient or caller a return call regarding the process. Patient has part A Medicare and will stop by the office to pick up application.

## 2022-03-23 ENCOUNTER — Other Ambulatory Visit: Payer: Self-pay

## 2022-03-31 ENCOUNTER — Other Ambulatory Visit: Payer: Self-pay

## 2022-04-01 ENCOUNTER — Other Ambulatory Visit: Payer: Self-pay

## 2022-04-20 ENCOUNTER — Encounter: Payer: Self-pay | Admitting: Gastroenterology

## 2022-04-20 ENCOUNTER — Telehealth: Payer: Self-pay | Admitting: Gastroenterology

## 2022-04-20 NOTE — Telephone Encounter (Signed)
Good Morning Dr. Lyndel Safe  I called this patient at 7:30 am to see if he was coming for his procedure, he stated he was having flu like symptoms.

## 2022-04-20 NOTE — Telephone Encounter (Signed)
Thanks for letting me know Can reschedule when he is feeling better RG

## 2022-04-28 ENCOUNTER — Other Ambulatory Visit: Payer: Self-pay | Admitting: Pharmacist

## 2022-04-28 ENCOUNTER — Other Ambulatory Visit: Payer: Self-pay

## 2022-04-28 MED ORDER — INSULIN GLARGINE-YFGN 100 UNIT/ML ~~LOC~~ SOPN
28.0000 [IU] | PEN_INJECTOR | Freq: Two times a day (BID) | SUBCUTANEOUS | 1 refills | Status: DC
  Filled 2022-04-28: qty 15, 27d supply, fill #0
  Filled 2022-05-23: qty 15, 27d supply, fill #1

## 2022-05-16 ENCOUNTER — Other Ambulatory Visit: Payer: Self-pay

## 2022-05-23 ENCOUNTER — Encounter: Payer: Self-pay | Admitting: Nurse Practitioner

## 2022-05-23 ENCOUNTER — Other Ambulatory Visit: Payer: Self-pay

## 2022-05-23 ENCOUNTER — Ambulatory Visit: Payer: Self-pay | Attending: Nurse Practitioner | Admitting: Nurse Practitioner

## 2022-05-23 VITALS — BP 155/76 | HR 62 | Ht 65.0 in | Wt 137.8 lb

## 2022-05-23 DIAGNOSIS — Z91148 Patient's other noncompliance with medication regimen for other reason: Secondary | ICD-10-CM | POA: Insufficient documentation

## 2022-05-23 DIAGNOSIS — I25119 Atherosclerotic heart disease of native coronary artery with unspecified angina pectoris: Secondary | ICD-10-CM | POA: Insufficient documentation

## 2022-05-23 DIAGNOSIS — Z7984 Long term (current) use of oral hypoglycemic drugs: Secondary | ICD-10-CM | POA: Insufficient documentation

## 2022-05-23 DIAGNOSIS — E1165 Type 2 diabetes mellitus with hyperglycemia: Secondary | ICD-10-CM | POA: Insufficient documentation

## 2022-05-23 DIAGNOSIS — Z7985 Long-term (current) use of injectable non-insulin antidiabetic drugs: Secondary | ICD-10-CM | POA: Insufficient documentation

## 2022-05-23 DIAGNOSIS — I10 Essential (primary) hypertension: Secondary | ICD-10-CM | POA: Insufficient documentation

## 2022-05-23 DIAGNOSIS — Z79899 Other long term (current) drug therapy: Secondary | ICD-10-CM | POA: Insufficient documentation

## 2022-05-23 DIAGNOSIS — F5101 Primary insomnia: Secondary | ICD-10-CM | POA: Insufficient documentation

## 2022-05-23 DIAGNOSIS — Z794 Long term (current) use of insulin: Secondary | ICD-10-CM | POA: Insufficient documentation

## 2022-05-23 DIAGNOSIS — Z833 Family history of diabetes mellitus: Secondary | ICD-10-CM | POA: Insufficient documentation

## 2022-05-23 LAB — GLUCOSE, POCT (MANUAL RESULT ENTRY): POC Glucose: 138 mg/dl — AB (ref 70–99)

## 2022-05-23 MED ORDER — CARVEDILOL 6.25 MG PO TABS
6.2500 mg | ORAL_TABLET | Freq: Two times a day (BID) | ORAL | 1 refills | Status: DC
  Filled 2022-05-23: qty 180, 90d supply, fill #0
  Filled 2022-11-09: qty 180, 90d supply, fill #1

## 2022-05-23 MED ORDER — TRAZODONE HCL 50 MG PO TABS
50.0000 mg | ORAL_TABLET | Freq: Every day | ORAL | 1 refills | Status: AC
  Filled 2022-05-23: qty 180, 90d supply, fill #0
  Filled 2022-11-09: qty 180, 90d supply, fill #1

## 2022-05-23 NOTE — Progress Notes (Signed)
Assessment & Plan:  Carl Clark was seen today for insomnia and hypertension.  Diagnoses and all orders for this visit:  Type 2 diabetes mellitus with hyperglycemia, with long-term current use of insulin (HCC) -     POCT glucose (manual entry) -     Hemoglobin A1c -     Basic metabolic panel Continue blood sugar control as discussed in office today, low carbohydrate diet, and regular physical exercise as tolerated, 150 minutes per week (30 min each day, 5 days per week, or 50 min 3 days per week). Keep blood sugar logs with fasting goal of 90-130 mg/dl, post prandial (after you eat) less than 180.  For Hypoglycemia: BS <60 and Hyperglycemia BS >400; contact the clinic ASAP. Annual eye exams and foot exams are recommended.   Primary insomnia -     traZODone (DESYREL) 50 MG tablet; Take 1-2 tablets (50-100 mg total) by mouth at bedtime. For SLEEP  Atherosclerosis of native coronary artery of native heart with angina pectoris (HCC) STOP METOPROLOL -     carvedilol (COREG) 6.25 MG tablet; Take 1 tablet (6.25 mg total) by mouth 2 (two) times daily with a meal. FOR BLOOD PRESSURE. STOP METOPROLOL!! Continue amlodipine and valsartan as prescribed.  Reminded to bring in blood pressure log for follow  up appointment.  RECOMMENDATIONS: DASH/Mediterranean Diets are healthier choices for HTN.     Patient has been counseled on age-appropriate routine health concerns for screening and prevention. These are reviewed and up-to-date. Referrals have been placed accordingly. Immunizations are up-to-date or declined.    Subjective:   Chief Complaint  Patient presents with   Insomnia   Hypertension   HPI Carl Clark 72 y.o. male presents to office today for follow up to HTN. He endorses insomnia with difficulty falling and staying asleep however he has not been taking trazodone. States he does not know what his medications are for. I explained to him that his medications have the  indications printed on the labels   HTN Blood pressure is elevated. Will switch metoprolol to carvedilol  and he will continue on amlodipine and valsartan. Do not recommend thiazide as he has a history of gout. He does not have a blood pressure monitoring device at home.  BP Readings from Last 3 Encounters:  05/23/22 (!) 155/76  02/21/22 (!) 156/78  02/02/22 (!) 146/88     Review of Systems  Constitutional:  Negative for fever, malaise/fatigue and weight loss.  HENT: Negative.  Negative for nosebleeds.   Eyes: Negative.  Negative for blurred vision, double vision and photophobia.  Respiratory: Negative.  Negative for cough and shortness of breath.   Cardiovascular: Negative.  Negative for chest pain, palpitations and leg swelling.  Gastrointestinal: Negative.  Negative for heartburn, nausea and vomiting.  Musculoskeletal: Negative.  Negative for myalgias.  Neurological: Negative.  Negative for dizziness, focal weakness, seizures and headaches.  Psychiatric/Behavioral:  Negative for suicidal ideas. The patient has insomnia.     Past Medical History:  Diagnosis Date   CAD (coronary artery disease) 03/16/2017   S/p NSTEMI 6/18 >> LHC: multivessel CAD >> s/p CABG // Echo 6/18: Mild LVH, EF 60-65, normal wall motion, grade 1 diastolic dysfunction, normal RV SF, PASP 36 (borderline pulmonary hypertension) // Pre-CABG Dopplers 09/13/16: Bilateral ICA 1-39   Echocardiogram    Echo 09/2018: EF 60-65, normal wall motion, RVSP 24.9   Family history of anesthesia complication    "never had anesthesia" (07/14/2012)   History of non-ST elevation myocardial  infarction (NSTEMI) 09/12/2016   Treated with CABG   History of rectal bleeding    secondary to diverticulum affecting right hepatic flexure - 06/2012   Hypertension    Mild carotid artery disease (HCC)    a. 1-39% bilaterally preCABG in 2018.   Type II diabetes mellitus [facility])     Past Surgical History:  Procedure Laterality Date    COLONOSCOPY Left 07/14/2012   Procedure: COLONOSCOPY;  Surgeon: Arta Silence, MD;  Location: [facility] ENDOSCOPY;  Service: Endoscopy;  Laterality: Left;   CORONARY ARTERY BYPASS GRAFT N/A 09/15/2016   Procedure: CORONARY ARTERY BYPASS GRAFTING (CABG) x 3, LIMA to LAD, SVG to DIAGONAL, SVG to OM2, USING LEFT MAMMARY ARTERY AND RIGHT GREATER SAPHENOUS VEIN HARVESTED ENDOSCOPICALLY;  Surgeon: Grace Isaac, MD;  Location: [place];  Service: Open Heart Surgery;  Laterality: N/A;   ESOPHAGOGASTRODUODENOSCOPY N/A 07/13/2012   Procedure: ESOPHAGOGASTRODUODENOSCOPY (EGD);  Surgeon: Arta Silence, MD;  Location: [facility] ENDOSCOPY;  Service: Endoscopy;  Laterality: N/A;  pt in ED A8   LEFT HEART CATH AND CORONARY ANGIOGRAPHY N/A 09/12/2016   Procedure: Left Heart Cath and Coronary Angiography;  Surgeon: Sherren Mocha, MD;  Location: [place] CV LAB;  Service: Cardiovascular;  Laterality: N/A;   NO PAST SURGERIES     TEE WITHOUT CARDIOVERSION N/A 09/15/2016   Procedure: TRANSESOPHAGEAL ECHOCARDIOGRAM (TEE);  Surgeon: Grace Isaac, MD;  Location: [place];  Service: Open Heart Surgery;  Laterality: N/A;    Family History  Problem Relation Age of Onset   Diabetes Mother    Diabetes Father     Social History Reviewed with no changes to be made today.   Outpatient Medications Prior to Visit  Medication Sig Dispense Refill   Accu-Chek Softclix Lancets lancets Use as instructed. Inject into the skin twice daily 100 each 3   amLODipine (NORVASC) 10 MG tablet Take 1 tablet (10 mg total) by mouth daily. FOR BLOOD PRESSURE 90 tablet 1   aspirin EC 81 MG tablet Take 1 tablet (81 mg total) by mouth daily. Swallow whole. 90 tablet 3   atorvastatin (LIPITOR) 80 MG tablet Take once daily at 6pm for cholesterol . 90 tablet 3   Blood Glucose Monitoring Suppl (ACCU-CHEK GUIDE ME) w/Device KIT Use to check blood sugar 2 times daily. E11.9 1 kit 0   Blood Pressure Monitor DEVI Please provide patient with insurance  approved blood pressure monitor 1 each 0   dextromethorphan-guaiFENesin (TUSSIN DM) 10-100 MG/5ML liquid Take 5 mLs by mouth every 4 (four) hours as needed for cough. 240 mL 1   diclofenac (VOLTAREN) 75 MG EC tablet Take 1 tablet (75 mg total) by mouth 2 (two) times daily. 60 tablet 2   Dulaglutide (TRULICITY) 1.5 0000000 SOPN Inject 1.5 mg into the skin once a week. FOR DIABETES 6 mL 1   fluticasone (FLONASE) 50 MCG/ACT nasal spray Place 2 sprays into both nostrils daily. 16 g 6   glucose blood (ACCU-CHEK GUIDE) test strip Use as instructed. Check blood glucose by fingerstick twice per day. 100 each 12   hydrocortisone cream 0.5 % Apply 1 application topically daily. Apply to lips 30 g 1   insulin glargine-yfgn (SEMGLEE, YFGN,) 100 UNIT/ML Pen Inject 28 Units into the skin 2 (two) times daily. 45 mL 1   metFORMIN (GLUCOPHAGE) 1000 MG tablet Take 1 tablet (1,000 mg total) by mouth 2 (two) times daily with a meal. For DIABETES. 180 tablet 1   Misc. Devices MISC Please provide patient with an  insurance approved QUAD CANE 1 each 0   nitroGLYCERIN (NITROSTAT) 0.4 MG SL tablet Dissolve 1 tablet under the tongue every 5 minutes as needed for chest pain. Max of 3 doses, then 911. 25 tablet 6   omeprazole (PRILOSEC) 20 MG capsule Take 1 capsule (20 mg total) by mouth daily. 90 capsule 3   pregabalin (LYRICA) 75 MG capsule Take 1 capsule (75 mg total) by mouth 2 (two) times daily. 60 capsule 3   senna-docusate (SENOKOT-S) 8.6-50 MG tablet Take 2 tablets by mouth daily. FOR CONSTIPATION 90 tablet 3   triamcinolone (KENALOG) 0.025 % ointment Apply 1 Application topically 2 (two) times daily onto the back of the head 454 g 1   valsartan (DIOVAN) 160 MG tablet Take 1 tablet (160 mg total) by mouth daily. For blood pressure 90 tablet 3   metoprolol tartrate (LOPRESSOR) 25 MG tablet Take 1 tablet (25 mg total) by mouth 2 (two) times daily. FOR BLOOD PRESSURE 180 tablet 3   traZODone (DESYREL) 50 MG tablet Take  0.5-1 tablets (25-50 mg total) by mouth at bedtime. For SLEEP 90 tablet 1   No facility-administered medications prior to visit.    Allergies  Allergen Reactions   Victoza [Liraglutide] Swelling    Lip swelling, itching, rash       Objective:    BP (!) 155/76 (BP Location: Left Arm, Patient Position: Sitting)   Pulse 62   Ht '5\' 5"'$  (1.651 m)   Wt 137 lb 12.8 oz (62.5 kg)   SpO2 98%   BMI 22.93 kg/m  Wt Readings from Last 3 Encounters:  05/23/22 137 lb 12.8 oz (62.5 kg)  02/21/22 131 lb 3.2 oz (59.5 kg)  02/02/22 133 lb 6.4 oz (60.5 kg)    Physical Exam Vitals and nursing note reviewed.  Constitutional:      Appearance: He is well-developed.  HENT:     Head: Normocephalic and atraumatic.  Cardiovascular:     Rate and Rhythm: Normal rate and regular rhythm.     Heart sounds: Normal heart sounds. No murmur heard.    No friction rub. No gallop.  Pulmonary:     Effort: Pulmonary effort is normal. No tachypnea or respiratory distress.     Breath sounds: Normal breath sounds. No decreased breath sounds, wheezing, rhonchi or rales.  Chest:     Chest wall: No tenderness.  Abdominal:     General: Bowel sounds are normal.     Palpations: Abdomen is soft.  Musculoskeletal:        General: Normal range of motion.     Cervical back: Normal range of motion.  Skin:    General: Skin is warm and dry.  Neurological:     Mental Status: He is alert and oriented to person, place, and time.     Coordination: Coordination normal.  Psychiatric:        Behavior: Behavior normal. Behavior is cooperative.        Thought Content: Thought content normal.        Judgment: Judgment normal.          Patient has been counseled extensively about nutrition and exercise as well as the importance of adherence with medications and regular follow-up. The patient was given clear instructions to go to ER or return to medical center if symptoms don't improve, worsen or new problems develop. The  patient verbalized understanding.   Follow-up: Return for 2-3 weeks. me luke or angela or last resort nurse visit blood pressure  check. .   Gildardo Pounds, FNP-BC [facility] and [facility], [place]   05/23/2022, 12:15 PM

## 2022-05-24 LAB — BASIC METABOLIC PANEL
BUN/Creatinine Ratio: 20 (ref 10–24)
BUN: 22 mg/dL (ref 8–27)
CO2: 24 mmol/L (ref 20–29)
Calcium: 9.4 mg/dL (ref 8.6–10.2)
Chloride: 103 mmol/L (ref 96–106)
Creatinine, Ser: 1.08 mg/dL (ref 0.76–1.27)
Glucose: 138 mg/dL — ABNORMAL HIGH (ref 70–99)
Potassium: 4.5 mmol/L (ref 3.5–5.2)
Sodium: 139 mmol/L (ref 134–144)
eGFR: 73 mL/min/{1.73_m2} (ref >59)

## 2022-05-24 LAB — HEMOGLOBIN A1C
Est. average glucose Bld gHb Est-mCnc: 171 mg/dL
Hgb A1c MFr Bld: 7.6 % — ABNORMAL HIGH (ref 4.8–5.6)

## 2022-05-26 ENCOUNTER — Other Ambulatory Visit: Payer: Self-pay

## 2022-05-30 ENCOUNTER — Other Ambulatory Visit: Payer: Self-pay

## 2022-06-09 ENCOUNTER — Other Ambulatory Visit: Payer: Self-pay

## 2022-06-09 ENCOUNTER — Ambulatory Visit: Payer: Self-pay | Attending: Physician Assistant | Admitting: Physician Assistant

## 2022-06-09 ENCOUNTER — Encounter: Payer: Self-pay | Admitting: Physician Assistant

## 2022-06-09 VITALS — BP 130/64 | HR 63 | Temp 97.9°F | Wt 141.2 lb

## 2022-06-09 DIAGNOSIS — Z794 Long term (current) use of insulin: Secondary | ICD-10-CM

## 2022-06-09 DIAGNOSIS — E1165 Type 2 diabetes mellitus with hyperglycemia: Secondary | ICD-10-CM

## 2022-06-09 DIAGNOSIS — I10 Essential (primary) hypertension: Secondary | ICD-10-CM

## 2022-06-09 DIAGNOSIS — J069 Acute upper respiratory infection, unspecified: Secondary | ICD-10-CM

## 2022-06-09 LAB — GLUCOSE, POCT (MANUAL RESULT ENTRY): POC Glucose: 318 mg/dl — AB (ref 70–99)

## 2022-06-09 MED ORDER — ACCU-CHEK SOFTCLIX LANCETS MISC
3 refills | Status: DC
  Filled 2022-06-09: qty 100, 50d supply, fill #0

## 2022-06-09 MED ORDER — ACCU-CHEK GUIDE VI STRP
ORAL_STRIP | 12 refills | Status: DC
  Filled 2022-06-09: qty 100, 50d supply, fill #0

## 2022-06-09 MED ORDER — AZITHROMYCIN 250 MG PO TABS
ORAL_TABLET | ORAL | 0 refills | Status: AC
  Filled 2022-06-09 (×2): qty 6, 5d supply, fill #0

## 2022-06-09 NOTE — Progress Notes (Signed)
Patient ID: Carl Clark, male   DOB: 1950/08/03, 72 y.o.   MRN: [id number]   Light Florian, is a 72 y.o. male  [id number]  [id number]  DOB - 1950-10-17  Chief Complaint  Patient presents with   Cough   Sore Throat   Diabetes       Subjective:   Carl Clark is a 72 y.o. male here today for BP recheck after carvedilol was added to regimen a few weeks ago.  He is tolerating the meds w/o SE.    Also having a cough and mild ST.  No fever.  No hemoptysis.  Some greenish phlegm No problems updated.  ALLERGIES: Allergies  Allergen Reactions   Victoza [Liraglutide] Swelling    Lip swelling, itching, rash    PAST MEDICAL HISTORY: Past Medical History:  Diagnosis Date   CAD (coronary artery disease) 03/16/2017   S/p NSTEMI 6/18 >> LHC: multivessel CAD >> s/p CABG // Echo 6/18: Mild LVH, EF 60-65, normal wall motion, grade 1 diastolic dysfunction, normal RV SF, PASP 36 (borderline pulmonary hypertension) // Pre-CABG Dopplers 09/13/16: Bilateral ICA 1-39   Echocardiogram    Echo 09/2018: EF 60-65, normal wall motion, RVSP 24.9   Family history of anesthesia complication    "never had anesthesia" (07/14/2012)   History of non-ST elevation myocardial infarction (NSTEMI) 09/12/2016   Treated with CABG   History of rectal bleeding    secondary to diverticulum affecting right hepatic flexure - 06/2012   Hypertension    Mild carotid artery disease (HCC)    a. 1-39% bilaterally preCABG in 2018.   Type II diabetes mellitus ([place])     MEDICATIONS AT HOME: Prior to Admission medications   Medication Sig Start Date End Date Taking? Authorizing Provider  amLODipine (NORVASC) 10 MG tablet Take 1 tablet (10 mg total) by mouth daily. FOR BLOOD PRESSURE 02/21/22  Yes Gildardo Pounds, NP  aspirin EC 81 MG tablet Take 1 tablet (81 mg total) by mouth daily. Swallow whole. 11/17/21  Yes Sherren Mocha, MD  atorvastatin (LIPITOR) 80 MG tablet Take once daily at 6pm for  cholesterol . 11/17/21  Yes Sherren Mocha, MD  azithromycin [facility]) 250 MG tablet Take 2 tablets on day 1, then 1 tablet daily on days 2 through 5 06/09/22 06/14/22 Yes Salomon Ganser, Dionne Bucy, PA-C  Blood Glucose Monitoring Suppl (ACCU-CHEK GUIDE ME) w/Device KIT Use to check blood sugar 2 times daily. E11.9 02/21/22  Yes Gildardo Pounds, NP  Blood Pressure Monitor DEVI Please provide patient with insurance approved blood pressure monitor 08/06/21  Yes Gildardo Pounds, NP  carvedilol (COREG) 6.25 MG tablet Take 1 tablet (6.25 mg total) by mouth 2 (two) times daily with a meal. FOR BLOOD PRESSURE. STOP METOPROLOL!! 05/23/22  Yes Gildardo Pounds, NP  dextromethorphan-guaiFENesin (TUSSIN DM) 10-100 MG/5ML liquid Take 5 mLs by mouth every 4 (four) hours as needed for cough. 02/21/22  Yes Gildardo Pounds, NP  diclofenac (VOLTAREN) 75 MG EC tablet Take 1 tablet (75 mg total) by mouth 2 (two) times daily. 11/19/21  Yes Gildardo Pounds, NP  Dulaglutide (TRULICITY) 1.5 0000000 SOPN Inject 1.5 mg into the skin once a week. FOR DIABETES 02/21/22  Yes Gildardo Pounds, NP  fluticasone (FLONASE) 50 MCG/ACT nasal spray Place 2 sprays into both nostrils daily. 08/24/20  Yes Gildardo Pounds, NP  hydrocortisone cream 0.5 % Apply 1 application topically daily. Apply to lips 11/30/20  Yes Gildardo Pounds, NP  insulin glargine-yfgn [facility],  YFGN,) 100 UNIT/ML Pen Inject 28 Units into the skin 2 (two) times daily. 04/28/22  Yes Charlott Rakes, MD  metFORMIN (GLUCOPHAGE) 1000 MG tablet Take 1 tablet (1,000 mg total) by mouth 2 (two) times daily with a meal. For DIABETES. 02/21/22  Yes Gildardo Pounds, NP  Misc. Devices MISC Please provide patient with an insurance approved QUAD CANE 08/10/17  Yes Gildardo Pounds, NP  nitroGLYCERIN (NITROSTAT) 0.4 MG SL tablet Dissolve 1 tablet under the tongue every 5 minutes as needed for chest pain. Max of 3 doses, then 911. 11/17/21  Yes Sherren Mocha, MD  omeprazole (PRILOSEC) 20 MG capsule  Take 1 capsule (20 mg total) by mouth daily. 02/02/22  Yes Jackquline Denmark, MD  pregabalin (LYRICA) 75 MG capsule Take 1 capsule (75 mg total) by mouth 2 (two) times daily. 11/19/21  Yes Gildardo Pounds, NP  senna-docusate (SENOKOT-S) 8.6-50 MG tablet Take 2 tablets by mouth daily. FOR CONSTIPATION 02/21/22  Yes Gildardo Pounds, NP  traZODone (DESYREL) 50 MG tablet Take 1-2 tablets (50-100 mg total) by mouth at bedtime. For SLEEP 05/23/22  Yes Gildardo Pounds, NP  triamcinolone (KENALOG) 0.025 % ointment Apply 1 Application topically 2 (two) times daily onto the back of the head 02/21/22  Yes Gildardo Pounds, NP  valsartan (DIOVAN) 160 MG tablet Take 1 tablet (160 mg total) by mouth daily. For blood pressure 11/17/21  Yes Sherren Mocha, MD  Accu-Chek Softclix Lancets lancets Use as instructed. Inject into the skin twice daily 06/09/22   Argentina Donovan, PA-C  glucose blood (ACCU-CHEK GUIDE) test strip Use as instructed. Check blood glucose by fingerstick twice per day. 06/09/22   Argentina Donovan, PA-C    ROS: Neg cardiac Neg GI Neg GU Neg MS Neg psych Neg neuro  Objective:   Vitals:   06/09/22 0955 06/09/22 1023  BP: (!) 144/77 130/64  Pulse: 63   Temp: 97.9 F (36.6 C)   SpO2: 98%   Weight: 141 lb 3.2 oz (64 kg)    Exam General appearance : Awake, alert, not in any distress. Speech Clear. Not toxic looking HEENT: Atraumatic and Normocephalic Neck: Supple, no JVD. No cervical lymphadenopathy.  Chest: Good air entry bilaterally, CTAB.  No rales/rhonchi/wheezing CVS: S1 S2 regular, no murmurs.  Extremities: B/L Lower Ext shows no edema, both legs are warm to touch Neurology: Awake alert, and oriented X 3, CN II-XII intact, Non focal Skin: No Rash  Data Review Lab Results  Component Value Date   HGBA1C 7.6 (H) 05/23/2022   HGBA1C 6.6 02/21/2022   HGBA1C 7.1 (A) 11/19/2021    Assessment & Plan   1. Type 2 diabetes mellitus with hyperglycemia, with long-term current use of  insulin (HCC) Uncontrolled-work on diabetic diet and take meds as directed - Glucose (CBG) - Accu-Chek Softclix Lancets lancets; Use as instructed. Inject into the skin twice daily  Dispense: 100 each; Refill: 3 - glucose blood (ACCU-CHEK GUIDE) test strip; Use as instructed. Check blood glucose by fingerstick twice per day.  Dispense: 100 each; Refill: 12  2. Upper respiratory tract infection, unspecified type Will cover for atypicals - azithromycin (ZITHROMAX) 250 MG tablet; Take 2 tablets on day 1, then 1 tablet daily on days 2 through 5  Dispense: 6 tablet; Refill: 0  3. Primary hypertension At goal-continue current regimen-amlodipine, valsartan, and carvedilol    Return for see [name] in 3-4 months for chronic conditions.  The patient was given clear instructions to go to  ER or return to medical center if symptoms don't improve, worsen or new problems develop. The patient verbalized understanding. The patient was told to call to get lab results if they haven't heard anything in the next week.      Freeman Caldron, PA-C [facility] and [facility], [place]   06/09/2022, 10:24 AM

## 2022-06-16 ENCOUNTER — Other Ambulatory Visit: Payer: Self-pay

## 2022-09-13 ENCOUNTER — Encounter: Payer: Self-pay | Admitting: Nurse Practitioner

## 2022-09-13 ENCOUNTER — Other Ambulatory Visit: Payer: Self-pay

## 2022-09-13 ENCOUNTER — Ambulatory Visit: Payer: Self-pay | Attending: Nurse Practitioner | Admitting: Nurse Practitioner

## 2022-09-13 VITALS — BP 137/67 | HR 50 | Ht 65.0 in | Wt 138.4 lb

## 2022-09-13 DIAGNOSIS — D649 Anemia, unspecified: Secondary | ICD-10-CM

## 2022-09-13 DIAGNOSIS — E1165 Type 2 diabetes mellitus with hyperglycemia: Secondary | ICD-10-CM

## 2022-09-13 DIAGNOSIS — Z794 Long term (current) use of insulin: Secondary | ICD-10-CM

## 2022-09-13 DIAGNOSIS — E119 Type 2 diabetes mellitus without complications: Secondary | ICD-10-CM

## 2022-09-13 LAB — POCT GLYCOSYLATED HEMOGLOBIN (HGB A1C): Hemoglobin A1C: 12.8 % — AB (ref 4.0–5.6)

## 2022-09-13 MED ORDER — ACCU-CHEK SOFTCLIX LANCETS MISC
3 refills | Status: DC
  Filled 2022-09-13: qty 100, fill #0
  Filled 2022-09-13: qty 100, 50d supply, fill #0
  Filled 2022-11-09: qty 100, 50d supply, fill #1

## 2022-09-13 MED ORDER — ATORVASTATIN CALCIUM 80 MG PO TABS
80.0000 mg | ORAL_TABLET | Freq: Every day | ORAL | 3 refills | Status: DC
  Filled 2022-09-13: qty 90, 90d supply, fill #0

## 2022-09-13 MED ORDER — ACCU-CHEK GUIDE VI STRP
ORAL_STRIP | 12 refills | Status: DC
  Filled 2022-09-13 (×2): qty 100, 50d supply, fill #0
  Filled 2022-11-09: qty 100, 50d supply, fill #1
  Filled 2022-12-13: qty 100, 50d supply, fill #2

## 2022-09-13 NOTE — Progress Notes (Signed)
Assessment & Plan:  Carl Clark was seen today for diabetes and hypertension.  Diagnoses and all orders for this visit:  Type 2 diabetes mellitus with hyperglycemia, with long-term current use of insulin (HCC) -     POCT glycosylated hemoglobin (Hb A1C) -     CMP14+EGFR -     Hemoglobin A1c -     Accu-Chek Softclix Lancets lancets; Use as instructed. Inject into the skin twice daily -     glucose blood (ACCU-CHEK GUIDE) test strip; Use as instructed. Check blood glucose by fingerstick twice per day.  Type 2 diabetes mellitus without complication, without long-term current use of insulin (HCC) -     atorvastatin (LIPITOR) 80 MG tablet; Take 1 tablet (80 mg total) by mouth daily at 6 PM for cholesterol.  Anemia, unspecified type -     CBC with Differential    Patient has been counseled on age-appropriate routine health concerns for screening and prevention. These are reviewed and up-to-date. Referrals have been placed accordingly. Immunizations are up-to-date or declined.    Subjective:   Chief Complaint  Patient presents with   Diabetes   Hypertension   HPI Carl Clark 72 y.o. male presents to office today for follow up to DM.  VRI was used to communicate directly with patient for the entire encounter including providing detailed patient instructions.    He has a past medical history of CAD (03/16/2017), Echocardiogram, Family history of anesthesia complication, History NSTEMI(09/12/2016),  rectal bleeding, Hypertension, HPL, vasovagal syncope, and Type II diabetes mellitus   Notes mild swelling in both legs and feet when standing for prolonged periods of time. Does resolve when he is able to sit for a while. Denies any worsening shortness of breath or orthopnea.   DM 2 Diabetes appears to be worsening.  A1c increased from 7.6 to 12.8.  He endorses dietary non adherence. Was not aware that he was supposed to be administering 28 units of insulin BID and has only been  administering 20 units BID.  He now understands he is to administer 28 units BID. He has ben eating a lot of rice, carbs, drinking sugary drinks.  He endorses adherence with Trulicity 1.5 mg weekly and metformin 1000 mg twice daily Lab Results  Component Value Date   HGBA1C 12.8 (A) 09/13/2022   Lab Results  Component Value Date   HGBA1C 7.6 (H) 05/23/2022    HTN Blood pressure is well controlled with amlodipine 10 mg daily, carvedilol 6.25 mg BID and valsartan 160mg  daily.  BP Readings from Last 3 Encounters:  09/13/22 137/67  06/09/22 130/64  05/23/22 (!) 155/76    Review of Systems  Constitutional:  Negative for fever, malaise/fatigue and weight loss.  HENT: Negative.  Negative for nosebleeds.   Eyes: Negative.  Negative for blurred vision, double vision and photophobia.  Respiratory: Negative.  Negative for cough and shortness of breath.   Cardiovascular:  Positive for leg swelling. Negative for chest pain and palpitations.  Gastrointestinal: Negative.  Negative for heartburn, nausea and vomiting.  Musculoskeletal: Negative.  Negative for myalgias.  Neurological: Negative.  Negative for dizziness, focal weakness, seizures and headaches.  Psychiatric/Behavioral: Negative.  Negative for suicidal ideas.     Past Medical History:  Diagnosis Date   CAD (coronary artery disease) 03/16/2017   S/p NSTEMI 6/18 >> LHC: multivessel CAD >> s/p CABG // Echo 6/18: Mild LVH, EF 60-65, normal wall motion, grade 1 diastolic dysfunction, normal RV SF, PASP 36 (borderline pulmonary hypertension) //  Pre-CABG Dopplers 09/13/16: Bilateral ICA 1-39   Echocardiogram    Echo 09/2018: EF 60-65, normal wall motion, RVSP 24.9   Family history of anesthesia complication    "never had anesthesia" (07/14/2012)   History of non-ST elevation myocardial infarction (NSTEMI) 09/12/2016   Treated with CABG   History of rectal bleeding    secondary to diverticulum affecting right hepatic flexure - 06/2012    Hypertension    Mild carotid artery disease (HCC)    a. 1-39% bilaterally preCABG in 2018.   Type II diabetes mellitus [facility])     Past Surgical History:  Procedure Laterality Date   COLONOSCOPY Left 07/14/2012   Procedure: COLONOSCOPY;  Surgeon: Willis Modena, MD;  [facility] ENDOSCOPY;  Service: Endoscopy;  Laterality: Left;   CORONARY ARTERY BYPASS GRAFT N/A 09/15/2016   Procedure: CORONARY ARTERY BYPASS GRAFTING (CABG) x 3, LIMA to LAD, SVG to DIAGONAL, SVG to OM2, USING LEFT MAMMARY ARTERY AND RIGHT GREATER SAPHENOUS VEIN HARVESTED ENDOSCOPICALLY;  Surgeon: Delight Ovens, MD;  Location: [facility] OR;  Service: Open Heart Surgery;  Laterality: N/A;   ESOPHAGOGASTRODUODENOSCOPY N/A 07/13/2012   Procedure: ESOPHAGOGASTRODUODENOSCOPY (EGD);  Surgeon: Willis Modena, MD;  Location: [facility] ENDOSCOPY;  Service: Endoscopy;  Laterality: N/A;  pt in ED A8   LEFT HEART CATH AND CORONARY ANGIOGRAPHY N/A 09/12/2016   Procedure: Left Heart Cath and Coronary Angiography;  Surgeon: Tonny Bollman, MD;  Location[facility] INVASIVE CV LAB;  Service: Cardiovascular;  Laterality: N/A;   NO PAST SURGERIES     TEE WITHOUT CARDIOVERSION N/A 09/15/2016   Procedure: TRANSESOPHAGEAL ECHOCARDIOGRAM (TEE);  Surgeon: Delight Ovens, MD;  Location: [facility] OR;  Service: Open Heart Surgery;  Laterality: N/A;    Family History  Problem Relation Age of Onset   Diabetes Mother    Diabetes Father     Social History Reviewed with no changes to be made today.   Outpatient Medications Prior to Visit  Medication Sig Dispense Refill   amLODipine (NORVASC) 10 MG tablet Take 1 tablet (10 mg total) by mouth daily. FOR BLOOD PRESSURE 90 tablet 1   aspirin EC 81 MG tablet Take 1 tablet (81 mg total) by mouth daily. Swallow whole. 90 tablet 3   Blood Glucose Monitoring Suppl (ACCU-CHEK GUIDE ME) w/Device KIT Use to check blood sugar 2 times daily. E11.9 1 kit 0   Blood Pressure Monitor DEVI Please provide patient with insurance approved  blood pressure monitor 1 each 0   carvedilol (COREG) 6.25 MG tablet Take 1 tablet (6.25 mg total) by mouth 2 (two) times daily with a meal. FOR BLOOD PRESSURE. STOP METOPROLOL!! 180 tablet 1   diclofenac (VOLTAREN) 75 MG EC tablet Take 1 tablet (75 mg total) by mouth 2 (two) times daily. 60 tablet 2   Dulaglutide (TRULICITY) 1.5 MG/0.5ML SOPN Inject 1.5 mg into the skin once a week. FOR DIABETES 6 mL 1   fluticasone (FLONASE) 50 MCG/ACT nasal spray Place 2 sprays into both nostrils daily. 16 g 6   insulin glargine-yfgn (SEMGLEE, YFGN,) 100 UNIT/ML Pen Inject 28 Units into the skin 2 (two) times daily. 45 mL 1   metFORMIN (GLUCOPHAGE) 1000 MG tablet Take 1 tablet (1,000 mg total) by mouth 2 (two) times daily with a meal. For DIABETES. 180 tablet 1   Misc. Devices MISC Please provide patient with an insurance approved QUAD CANE 1 each 0   nitroGLYCERIN (NITROSTAT) 0.4 MG SL tablet Dissolve 1 tablet under the tongue every 5 minutes as needed for  chest pain. Max of 3 doses, then 911. 25 tablet 6   pregabalin (LYRICA) 75 MG capsule Take 1 capsule (75 mg total) by mouth 2 (two) times daily. 60 capsule 3   senna-docusate (SENOKOT-S) 8.6-50 MG tablet Take 2 tablets by mouth daily. FOR CONSTIPATION 90 tablet 3   traZODone (DESYREL) 50 MG tablet Take 1-2 tablets (50-100 mg total) by mouth at bedtime. For SLEEP 180 tablet 1   valsartan (DIOVAN) 160 MG tablet Take 1 tablet (160 mg total) by mouth daily. For blood pressure 90 tablet 3   Accu-Chek Softclix Lancets lancets Use as instructed. Inject into the skin twice daily 100 each 3   atorvastatin (LIPITOR) 80 MG tablet Take once daily at 6pm for cholesterol . 90 tablet 3   dextromethorphan-guaiFENesin (TUSSIN DM) 10-100 MG/5ML liquid Take 5 mLs by mouth every 4 (four) hours as needed for cough. 240 mL 1   glucose blood (ACCU-CHEK GUIDE) test strip Use as instructed. Check blood glucose by fingerstick twice per day. 100 each 12   hydrocortisone cream 0.5 % Apply  1 application topically daily. Apply to lips (Patient not taking: Reported on 09/13/2022) 30 g 1   triamcinolone (KENALOG) 0.025 % ointment Apply 1 Application topically 2 (two) times daily onto the back of the head (Patient not taking: Reported on 09/13/2022) 454 g 1   omeprazole (PRILOSEC) 20 MG capsule Take 1 capsule (20 mg total) by mouth daily. (Patient not taking: Reported on 09/13/2022) 90 capsule 3   No facility-administered medications prior to visit.    Allergies  Allergen Reactions   Victoza [Liraglutide] Swelling    Lip swelling, itching, rash       Objective:    BP 137/67 (BP Location: Left Arm, Patient Position: Sitting, Cuff Size: Normal)   Pulse (!) 50   Ht 5\' 5"  (1.651 m)   Wt 138 lb 6.4 oz (62.8 kg)   SpO2 98%   BMI 23.03 kg/m  Wt Readings from Last 3 Encounters:  09/13/22 138 lb 6.4 oz (62.8 kg)  06/09/22 141 lb 3.2 oz (64 kg)  05/23/22 137 lb 12.8 oz (62.5 kg)    Physical Exam Vitals and nursing note reviewed.  Constitutional:      Appearance: He is well-developed.  HENT:     Head: Normocephalic and atraumatic.  Cardiovascular:     Rate and Rhythm: Regular rhythm. Bradycardia present.     Heart sounds: Normal heart sounds. No murmur heard.    No friction rub. No gallop.     Comments: asymptomatic Pulmonary:     Effort: Pulmonary effort is normal. No tachypnea or respiratory distress.     Breath sounds: Normal breath sounds. No decreased breath sounds, wheezing, rhonchi or rales.  Chest:     Chest wall: No tenderness.  Abdominal:     General: Bowel sounds are normal.     Palpations: Abdomen is soft.  Musculoskeletal:        General: Normal range of motion.     Cervical back: Normal range of motion.     Right lower leg: No edema.     Left lower leg: No edema.  Skin:    General: Skin is warm and dry.  Neurological:     Mental Status: He is alert and oriented to person, place, and time.     Coordination: Coordination normal.  Psychiatric:         Behavior: Behavior normal. Behavior is cooperative.        Thought Content:  Thought content normal.        Judgment: Judgment normal.          Patient has been counseled extensively about nutrition and exercise as well as the importance of adherence with medications and regular follow-up. The patient was given clear instructions to go to ER or return to medical center if symptoms don't improve, worsen or new problems develop. The patient verbalized understanding.   Follow-up: Return for 4 weeks with LUKE. See me in 3 months.   Claiborne Rigg, FNP-BC [facility] and [facility] [place], [place] [phone]   09/13/2022, 10:32 AM

## 2022-09-14 LAB — CBC WITH DIFFERENTIAL/PLATELET
Basophils Absolute: 0 10*3/uL (ref 0.0–0.2)
Basos: 1 %
EOS (ABSOLUTE): 0.4 10*3/uL (ref 0.0–0.4)
Eos: 12 %
Hematocrit: 34.2 % — ABNORMAL LOW (ref 37.5–51.0)
Hemoglobin: 11 g/dL — ABNORMAL LOW (ref 13.0–17.7)
Immature Grans (Abs): 0 10*3/uL (ref 0.0–0.1)
Immature Granulocytes: 0 %
Lymphocytes Absolute: 1.2 10*3/uL (ref 0.7–3.1)
Lymphs: 35 %
MCH: 30.6 pg (ref 26.6–33.0)
MCHC: 32.2 g/dL (ref 31.5–35.7)
MCV: 95 fL (ref 79–97)
Monocytes Absolute: 0.4 10*3/uL (ref 0.1–0.9)
Monocytes: 10 %
Neutrophils Absolute: 1.4 10*3/uL (ref 1.4–7.0)
Neutrophils: 42 %
Platelets: 237 10*3/uL (ref 150–450)
RBC: 3.59 x10E6/uL — ABNORMAL LOW (ref 4.14–5.80)
RDW: 12.6 % (ref 11.6–15.4)
WBC: 3.4 10*3/uL (ref 3.4–10.8)

## 2022-09-14 LAB — CMP14+EGFR
ALT: 11 IU/L (ref 0–44)
AST: 13 IU/L (ref 0–40)
Albumin: 3.9 g/dL (ref 3.8–4.8)
Alkaline Phosphatase: 79 IU/L (ref 44–121)
BUN/Creatinine Ratio: 14 (ref 10–24)
BUN: 14 mg/dL (ref 8–27)
Bilirubin Total: 0.5 mg/dL (ref 0.0–1.2)
CO2: 22 mmol/L (ref 20–29)
Calcium: 9.4 mg/dL (ref 8.6–10.2)
Chloride: 103 mmol/L (ref 96–106)
Creatinine, Ser: 1.03 mg/dL (ref 0.76–1.27)
Globulin, Total: 3.8 g/dL (ref 1.5–4.5)
Glucose: 332 mg/dL — ABNORMAL HIGH (ref 70–99)
Potassium: 4.8 mmol/L (ref 3.5–5.2)
Sodium: 137 mmol/L (ref 134–144)
Total Protein: 7.7 g/dL (ref 6.0–8.5)
eGFR: 78 mL/min/{1.73_m2} (ref >59)

## 2022-09-14 LAB — HEMOGLOBIN A1C
Est. average glucose Bld gHb Est-mCnc: 341 mg/dL
Hgb A1c MFr Bld: 13.5 % — ABNORMAL HIGH (ref 4.8–5.6)

## 2022-10-09 ENCOUNTER — Emergency Department (HOSPITAL_COMMUNITY)
Admission: EM | Admit: 2022-10-09 | Discharge: 2022-10-09 | Disposition: A | Discharge status: Home / Self Care | Payer: Self-pay | Attending: Emergency Medicine | Admitting: Emergency Medicine

## 2022-10-09 ENCOUNTER — Encounter (HOSPITAL_COMMUNITY): Payer: Self-pay

## 2022-10-09 ENCOUNTER — Emergency Department (HOSPITAL_COMMUNITY): Payer: Self-pay

## 2022-10-09 ENCOUNTER — Other Ambulatory Visit: Payer: Self-pay

## 2022-10-09 DIAGNOSIS — I2510 Atherosclerotic heart disease of native coronary artery without angina pectoris: Secondary | ICD-10-CM | POA: Insufficient documentation

## 2022-10-09 DIAGNOSIS — E119 Type 2 diabetes mellitus without complications: Secondary | ICD-10-CM | POA: Insufficient documentation

## 2022-10-09 DIAGNOSIS — Z79899 Other long term (current) drug therapy: Secondary | ICD-10-CM | POA: Insufficient documentation

## 2022-10-09 DIAGNOSIS — Z7982 Long term (current) use of aspirin: Secondary | ICD-10-CM | POA: Insufficient documentation

## 2022-10-09 DIAGNOSIS — Z951 Presence of aortocoronary bypass graft: Secondary | ICD-10-CM | POA: Insufficient documentation

## 2022-10-09 DIAGNOSIS — I10 Essential (primary) hypertension: Secondary | ICD-10-CM | POA: Insufficient documentation

## 2022-10-09 DIAGNOSIS — B029 Zoster without complications: Secondary | ICD-10-CM | POA: Insufficient documentation

## 2022-10-09 DIAGNOSIS — Z794 Long term (current) use of insulin: Secondary | ICD-10-CM | POA: Insufficient documentation

## 2022-10-09 LAB — CBC WITH DIFFERENTIAL/PLATELET
Abs Immature Granulocytes: 0.03 10*3/uL (ref 0.00–0.07)
Basophils Absolute: 0.1 10*3/uL (ref 0.0–0.1)
Basophils Relative: 1 %
Eosinophils Absolute: 0.3 10*3/uL (ref 0.0–0.5)
Eosinophils Relative: 8 %
HCT: 35.2 % — ABNORMAL LOW (ref 39.0–52.0)
Hemoglobin: 11.4 g/dL — ABNORMAL LOW (ref 13.0–17.0)
Immature Granulocytes: 1 %
Lymphocytes Relative: 30 %
Lymphs Abs: 1.2 10*3/uL (ref 0.7–4.0)
MCH: 30.1 pg (ref 26.0–34.0)
MCHC: 32.4 g/dL (ref 30.0–36.0)
MCV: 92.9 fL (ref 80.0–100.0)
Monocytes Absolute: 0.6 10*3/uL (ref 0.1–1.0)
Monocytes Relative: 16 %
Neutro Abs: 1.8 10*3/uL (ref 1.7–7.7)
Neutrophils Relative %: 44 %
Platelets: 209 10*3/uL (ref 150–400)
RBC: 3.79 MIL/uL — ABNORMAL LOW (ref 4.22–5.81)
RDW: 12.8 % (ref 11.5–15.5)
WBC: 4 10*3/uL (ref 4.0–10.5)
nRBC: 0 % (ref 0.0–0.2)

## 2022-10-09 LAB — CBG MONITORING, ED
Glucose-Capillary: 333 mg/dL — ABNORMAL HIGH (ref 70–99)
Glucose-Capillary: 346 mg/dL — ABNORMAL HIGH (ref 70–99)
Glucose-Capillary: 291 mg/dL — ABNORMAL HIGH (ref 70–99)

## 2022-10-09 LAB — BASIC METABOLIC PANEL
Anion gap: 11 (ref 5–15)
BUN: 22 mg/dL (ref 8–23)
CO2: 22 mmol/L (ref 22–32)
Calcium: 8.8 mg/dL — ABNORMAL LOW (ref 8.9–10.3)
Chloride: 100 mmol/L (ref 98–111)
Creatinine, Ser: 1.13 mg/dL (ref 0.61–1.24)
GFR, Estimated: >60 mL/min (ref >60)
Glucose, Bld: 432 mg/dL — ABNORMAL HIGH (ref 70–99)
Potassium: 4 mmol/L (ref 3.5–5.1)
Sodium: 133 mmol/L — ABNORMAL LOW (ref 135–145)

## 2022-10-09 LAB — TROPONIN I (HIGH SENSITIVITY)
Troponin I (High Sensitivity): 10 ng/L (ref <18)
Troponin I (High Sensitivity): 10 ng/L (ref <18)

## 2022-10-09 MED ORDER — VALACYCLOVIR HCL 500 MG PO TABS
1000.0000 mg | ORAL_TABLET | Freq: Once | ORAL | Status: AC
  Administered 2022-10-09: 1000 mg via ORAL
  Filled 2022-10-09: qty 2

## 2022-10-09 MED ORDER — INSULIN ASPART 100 UNIT/ML IJ SOLN
10.0000 [IU] | Freq: Once | INTRAMUSCULAR | Status: AC
  Administered 2022-10-09: 10 [IU] via SUBCUTANEOUS

## 2022-10-09 MED ORDER — HYDROCODONE-ACETAMINOPHEN 5-325 MG PO TABS
1.0000 | ORAL_TABLET | Freq: Four times a day (QID) | ORAL | 0 refills | Status: DC | PRN
  Filled 2022-10-09: qty 10, 3d supply, fill #0

## 2022-10-09 MED ORDER — VALACYCLOVIR HCL 1 G PO TABS
1000.0000 mg | ORAL_TABLET | Freq: Three times a day (TID) | ORAL | 0 refills | Status: DC
  Filled 2022-10-09: qty 21, 7d supply, fill #0

## 2022-10-09 MED ORDER — OXYCODONE-ACETAMINOPHEN 5-325 MG PO TABS
1.0000 | ORAL_TABLET | Freq: Once | ORAL | Status: AC
  Administered 2022-10-09: 1 via ORAL
  Filled 2022-10-09: qty 1

## 2022-10-09 MED ORDER — PREDNISONE 20 MG PO TABS
60.0000 mg | ORAL_TABLET | Freq: Once | ORAL | Status: AC
  Administered 2022-10-09: 60 mg via ORAL
  Filled 2022-10-09: qty 3

## 2022-10-09 NOTE — Discharge Instructions (Addendum)
Take the antiviral medication and pain medication as prescribed.  Monitor your blood sugars closely and follow-up with your doctor for recheck this week.  Return to the ED with chest pain, shortness of breath, and other concerns.

## 2022-10-09 NOTE — ED Triage Notes (Signed)
Complaining of a rash on the left arm and side of chest that started 5-6 days ago. Is causing him pain.

## 2022-10-09 NOTE — ED Provider Notes (Signed)
[place] EMERGENCY DEPARTMENT AT [facility] Provider Note   CSN: [id number] Arrival date & time: 10/09/22  0410     History  Chief Complaint  Patient presents with   Rash    Carl Clark is a 72 y.o. male.  Level 5 caveat for language barrier.  Translator is used.  Patient with a history of hypertension, diabetes, CAD status post CABG in 2018 presenting with painful rash to his left arm, axilla and chest for the past 5 days.  States he cannot sleep secondary to this pain.  Has not take anything for it at home.  Describes painful itchy blistering rash involving right arm, axilla and chest and upper back.  Chills but no documented fever.  No shortness of breath, nausea, vomiting, abdominal pain. Has not taken any pain medications at home.  Has not had this rash in the past.  No fever.  The history is provided by the patient.  Rash Associated symptoms: no abdominal pain, no fever, no headaches, no joint pain, no myalgias, no nausea, no shortness of breath and not vomiting        Home Medications Prior to Admission medications   Medication Sig Start Date End Date Taking? Authorizing Provider  Accu-Chek Softclix Lancets lancets Use as instructed. Inject into the skin twice daily 09/13/22   Claiborne Rigg, NP  amLODipine (NORVASC) 10 MG tablet Take 1 tablet (10 mg total) by mouth daily. FOR BLOOD PRESSURE 02/21/22   Claiborne Rigg, NP  aspirin EC 81 MG tablet Take 1 tablet (81 mg total) by mouth daily. Swallow whole. 11/17/21   Tonny Bollman, MD  atorvastatin (LIPITOR) 80 MG tablet Take 1 tablet (80 mg total) by mouth daily at 6 PM for cholesterol. 09/13/22   Claiborne Rigg, NP  Blood Glucose Monitoring Suppl (ACCU-CHEK GUIDE ME) w/Device KIT Use to check blood sugar 2 times daily. E11.9 02/21/22   Claiborne Rigg, NP  Blood Pressure Monitor DEVI Please provide patient with insurance approved blood pressure monitor 08/06/21   Claiborne Rigg, NP  carvedilol  (COREG) 6.25 MG tablet Take 1 tablet (6.25 mg total) by mouth 2 (two) times daily with a meal. FOR BLOOD PRESSURE. STOP METOPROLOL!! 05/23/22   Claiborne Rigg, NP  diclofenac (VOLTAREN) 75 MG EC tablet Take 1 tablet (75 mg total) by mouth 2 (two) times daily. 11/19/21   Claiborne Rigg, NP  Dulaglutide (TRULICITY) 1.5 MG/0.5ML SOPN Inject 1.5 mg into the skin once a week. FOR DIABETES 02/21/22   Claiborne Rigg, NP  fluticasone [facility]) 50 MCG/ACT nasal spray Place 2 sprays into both nostrils daily. 08/24/20   Claiborne Rigg, NP  glucose blood (ACCU-CHEK GUIDE) test strip Use as instructed. Check blood glucose by fingerstick twice per day. 09/13/22   Claiborne Rigg, NP  hydrocortisone cream 0.5 % Apply 1 application topically daily. Apply to lips Patient not taking: Reported on 09/13/2022 11/30/20   Claiborne Rigg, NP  insulin glargine-yfgn (SEMGLEE, YFGN,) 100 UNIT/ML Pen Inject 28 Units into the skin 2 (two) times daily. 04/28/22   Hoy Register, MD  metFORMIN (GLUCOPHAGE) 1000 MG tablet Take 1 tablet (1,000 mg total) by mouth 2 (two) times daily with a meal. For DIABETES. 02/21/22   Claiborne Rigg, NP  Misc. Devices MISC Please provide patient with an insurance approved QUAD CANE 08/10/17   Claiborne Rigg, NP  nitroGLYCERIN (NITROSTAT) 0.4 MG SL tablet Dissolve 1 tablet under the tongue every  5 minutes as needed for chest pain. Max of 3 doses, then 911. 11/17/21   Tonny Bollman, MD  pregabalin (LYRICA) 75 MG capsule Take 1 capsule (75 mg total) by mouth 2 (two) times daily. 11/19/21   Claiborne Rigg, NP  senna-docusate (SENOKOT-S) 8.6-50 MG tablet Take 2 tablets by mouth daily. FOR CONSTIPATION 02/21/22   Claiborne Rigg, NP  traZODone (DESYREL) 50 MG tablet Take 1-2 tablets (50-100 mg total) by mouth at bedtime. For SLEEP 05/23/22   Claiborne Rigg, NP  triamcinolone (KENALOG) 0.025 % ointment Apply 1 Application topically 2 (two) times daily onto the back of the head Patient not taking:  Reported on 09/13/2022 02/21/22   Claiborne Rigg, NP  valsartan (DIOVAN) 160 MG tablet Take 1 tablet (160 mg total) by mouth daily. For blood pressure 11/17/21   Tonny Bollman, MD      Allergies    Victoza [liraglutide]    Review of Systems   Review of Systems  Constitutional:  Negative for activity change, appetite change and fever.  HENT:  Negative for congestion and rhinorrhea.   Respiratory:  Negative for cough, chest tightness and shortness of breath.   Cardiovascular:  Negative for chest pain.  Gastrointestinal:  Negative for abdominal pain, nausea and vomiting.  Genitourinary:  Negative for dysuria and hematuria.  Musculoskeletal:  Negative for arthralgias and myalgias.  Skin:  Positive for rash.  Neurological:  Negative for dizziness, weakness and headaches.    all other systems are negative except as noted in the HPI and PMH.   Physical Exam Updated Vital Signs BP (!) 181/72 (BP Location: Right Arm)   Pulse 75   Temp 98.8 F (37.1 C) (Oral)   Resp 18   Ht 5\' 5"  (1.651 m)   Wt 61.2 kg   SpO2 92%   BMI 22.47 kg/m  Physical Exam Vitals and nursing note reviewed.  Constitutional:      General: He is not in acute distress.    Appearance: He is well-developed.  HENT:     Head: Normocephalic and atraumatic.     Mouth/Throat:     Pharynx: No oropharyngeal exudate.  Eyes:     Conjunctiva/sclera: Conjunctivae normal.     Pupils: Pupils are equal, round, and reactive to light.  Neck:     Comments: No meningismus. Cardiovascular:     Rate and Rhythm: Normal rate and regular rhythm.     Heart sounds: Normal heart sounds. No murmur heard. Pulmonary:     Effort: Pulmonary effort is normal. No respiratory distress.     Breath sounds: Normal breath sounds.  Abdominal:     Palpations: Abdomen is soft.     Tenderness: There is no abdominal tenderness. There is no guarding or rebound.  Musculoskeletal:        General: No tenderness. Normal range of motion.     Cervical  back: Normal range of motion and neck supple.     Comments: Vesicular blistering rash involving left arm as depicted extending to axilla and upper back, does not cross midline.  Intact radial pulse  Skin:    General: Skin is warm.  Neurological:     Mental Status: He is alert and oriented to person, place, and time.     Cranial Nerves: No cranial nerve deficit.     Motor: No abnormal muscle tone.     Coordination: Coordination normal.     Comments:  5/5 strength throughout. CN 2-12 intact.Equal grip strength.  Psychiatric:        Behavior: Behavior normal.          ED Results / Procedures / Treatments   Labs (all labs ordered are listed, but only abnormal results are displayed) Labs Reviewed  CBC WITH DIFFERENTIAL/PLATELET - Abnormal; Notable for the following components:      Result Value   RBC 3.79 (*)    Hemoglobin 11.4 (*)    HCT 35.2 (*)    All other components within normal limits  BASIC METABOLIC PANEL - Abnormal; Notable for the following components:   Sodium 133 (*)    Glucose, Bld 432 (*)    Calcium 8.8 (*)    All other components within normal limits  CBG MONITORING, ED - Abnormal; Notable for the following components:   Glucose-Capillary 346 (*)    All other components within normal limits  CBG MONITORING, ED - Abnormal; Notable for the following components:   Glucose-Capillary 333 (*)    All other components within normal limits  TROPONIN I (HIGH SENSITIVITY)  TROPONIN I (HIGH SENSITIVITY)    EKG EKG Interpretation Date/Time:  Sunday October 09 2022 05:53:09 EDT Ventricular Rate:  58 PR Interval:  141 QRS Duration:  85 QT Interval:  400 QTC Calculation: 393 R Axis:   62  Text Interpretation: Sinus rhythm Biatrial enlargement No significant change was found Confirmed by Glynn Octave [phone]) on 10/09/2022 5:56:11 AM  Radiology DG Chest 2 View  Result Date: 10/09/2022 CLINICAL DATA:  Chest pain EXAM: CHEST - 2 VIEW COMPARISON:  07/16/2018  FINDINGS: The lungs are clear without focal pneumonia, edema, pneumothorax or pleural effusion. The cardiopericardial silhouette is within normal limits for size. No acute bony abnormality. IMPRESSION: No active cardiopulmonary disease. Electronically Signed   By: Kennith Center M.D.   On: 10/09/2022 06:31    Procedures Procedures    Medications Ordered in ED Medications  valACYclovir (VALTREX) tablet 1,000 mg (has no administration in time range)  oxyCODONE-acetaminophen (PERCOCET/ROXICET) 5-325 MG per tablet 1 tablet (has no administration in time range)  predniSONE (DELTASONE) tablet 60 mg (has no administration in time range)    ED Course/ Medical Decision Making/ A&P                             Medical Decision Making Amount and/or Complexity of Data Reviewed Labs: ordered. Decision-making details documented in ED Course. Radiology: ordered and independent interpretation performed. Decision-making details documented in ED Course. ECG/medicine tests: ordered and independent interpretation performed. Decision-making details documented in ED Course.  Risk Prescription drug management.   Vesicular rash to left arm for the past 5 days consistent with shingles.  Low suspicion for ACS, PE, aortic dissection.  Hyperglycemia without evidence of DKA.  Troponin is negative.  Chest x-ray is negative.  Results reviewed interpreted by me.  Low suspicion for ACS, PE, or dissection.  Patient treated valacyclovir and pain control.  Will hold on steroids at this point given his hyperglycemia and history of diabetes.  Also has had a rash present for the past 5 days.  Care transferred at shift change pending improvement in blood sugar.  Anticipate discharge home with treatment for shingles. Dr. Jearld Fenton to assume care.         Final Clinical Impression(s) / ED Diagnoses Final diagnoses:  Herpes zoster without complication    Rx / DC Orders ED Discharge Orders     None  Glynn Octave, MD 10/09/22 [phone]

## 2022-10-09 NOTE — ED Provider Notes (Signed)
11:37 AM Assumed care of patient from off-going team. For more details, please see note from same day.  In brief, this is a 72 y.o. male  who p/w shingles. Has vesicular rash left arm, chest, axilla x 4 days. Glucose 432 mg/dL. On glargine/trulicity at home with no fast acting insulin.  Plan/Dispo at time of sign-out & ED Course since sign-out: [ ]  insulin prior to leaving [ ]  DC w/ valtrex  BP (!) 148/75 (BP Location: Right Arm)   Pulse (!) 56   Temp 97.7 F (36.5 C) (Oral)   Resp 16   Ht 5\' 5"  (1.651 m)   Wt 61.2 kg   SpO2 99%   BMI 22.47 kg/m    ED Course:   Clinical Course as of 10/09/22 1137  Sun Oct 09, 2022  0856 Glucose-Capillary(!): 291 Decreased <300 mg/dL. Patient can be DC'd with valtrex and PCP f/u for shingles. [HN]    Clinical Course User Index [HN] Loetta Rough, MD   ------------------------------- Vivi Barrack, MD Emergency Medicine  This note was created using dictation software, which may contain spelling or grammatical errors.   Loetta Rough, MD 10/09/22 [phone]

## 2022-10-10 ENCOUNTER — Other Ambulatory Visit: Payer: Self-pay

## 2022-10-11 ENCOUNTER — Encounter: Payer: Self-pay | Admitting: Pharmacist

## 2022-10-11 ENCOUNTER — Ambulatory Visit: Payer: Self-pay | Attending: Nurse Practitioner | Admitting: Pharmacist

## 2022-10-11 ENCOUNTER — Other Ambulatory Visit: Payer: Self-pay

## 2022-10-11 ENCOUNTER — Telehealth: Payer: Self-pay | Admitting: Pharmacist

## 2022-10-11 DIAGNOSIS — Z7985 Long-term (current) use of injectable non-insulin antidiabetic drugs: Secondary | ICD-10-CM

## 2022-10-11 DIAGNOSIS — E1165 Type 2 diabetes mellitus with hyperglycemia: Secondary | ICD-10-CM

## 2022-10-11 DIAGNOSIS — Z794 Long term (current) use of insulin: Secondary | ICD-10-CM

## 2022-10-11 DIAGNOSIS — Z7984 Long term (current) use of oral hypoglycemic drugs: Secondary | ICD-10-CM

## 2022-10-11 MED ORDER — TRULICITY 1.5 MG/0.5ML ~~LOC~~ SOAJ
1.5000 mg | SUBCUTANEOUS | 1 refills | Status: DC
Start: 2022-10-11 — End: 2023-04-24
  Filled 2022-10-11: qty 6, 84d supply, fill #0
  Filled 2022-11-09: qty 2, 28d supply, fill #0
  Filled 2022-12-13: qty 2, 28d supply, fill #1

## 2022-10-11 MED ORDER — INSULIN GLARGINE-YFGN 100 UNIT/ML ~~LOC~~ SOPN
28.0000 [IU] | PEN_INJECTOR | Freq: Two times a day (BID) | SUBCUTANEOUS | 1 refills | Status: DC
  Filled 2022-10-11: qty 18, 32d supply, fill #0

## 2022-10-11 MED ORDER — ROSUVASTATIN CALCIUM 20 MG PO TABS
20.0000 mg | ORAL_TABLET | Freq: Every day | ORAL | 3 refills | Status: DC
  Filled 2022-10-11: qty 90, 90d supply, fill #0

## 2022-10-11 MED ORDER — GABAPENTIN 100 MG PO CAPS
200.0000 mg | ORAL_CAPSULE | Freq: Two times a day (BID) | ORAL | 0 refills | Status: DC
  Filled 2022-10-11: qty 60, 15d supply, fill #0

## 2022-10-11 NOTE — Telephone Encounter (Signed)
Dr. Laural Benes,   Carl Clark is out and this patient was seen in the ED a couple of days ago. He was started on valacyclovir and hydrocodone-APAP. He is in significant pain and I discussed gabapentin with him. Would you be will to write a short supply of gabapentin for this patient for PHN? Describes burning pain on his left side and left arm. Vesicular coverage is down his left arm and side. Images were uploaded into Epic 10/09/22.

## 2022-10-11 NOTE — Progress Notes (Signed)
    S:    PCP: [name]  No chief complaint on file.  Patient arrives in good spirits.  Presents for diabetes management at the request of [name]. Patient was referred on 09/13/2022. His A1c at that visit was up from 7.6 prior to 13.5%. Pt gave reasoning of dietary indiscretion and medication non-adherence.   Today, pt reports significant pain since being diagnosed with shingles ~2 days ago. He has no complaints regarding his DM today. However, medication non-adherence has contributed significantly to his A1c being elevated. Review of his fill history indicates he last picked-up Trulicity 02/21/2022. Last picked-up a 27-day supply of Semglee on 04/28/2022.  Family/Social History:  - FHx: DM (mother, father) - Tobacco: never smoker - Alcohol: reports occasional use   Insurance coverage/medication affordability: none  Patient denies adherence with medications.  Current diabetes medications include:  - Semglee 28u BID  - Metformin 1000 mg BID - Trulicity 1.5 mg weekly  Patient denies hypoglycemic events.  Patient reported dietary habits:  - He does not limit carbohydrates  Patient-reported exercise habits:  - Does not exercise outside of work   Patient endorses polyuria. Denies polydipsia.  Patient denies neuropathy. Patient denies visual changes. Patient reports self foot exams.   O:  Home CBG ranges:  - Fasting: no meter today. Tells me his blood sugars are staying high.  Lab Results  Component Value Date   HGBA1C 13.5 (H) 09/13/2022   There were no vitals filed for this visit.  Lipid Panel     Component Value Date/Time   CHOL 118 08/06/2021 1051   TRIG 127 08/06/2021 1051   HDL 34 (L) 08/06/2021 1051   CHOLHDL 3.5 08/06/2021 1051   CHOLHDL 4.8 09/12/2016 0806   VLDL 29 09/12/2016 0806   LDLCALC 61 08/06/2021 1051   Clinical ASCVD: Yes - CAD, NSTEMI/CABG The ASCVD Risk score (Arnett DK, et al., 2019) failed to calculate for the following reasons:   The valid total  cholesterol range is 130 to 320 mg/dL   A/P: Diabetes longstanding currently uncontrolled secondary to non-adherence. Patient is able to verbalize appropriate hypoglycemia management plan. Patient is NOT compliant with insulin or Trulicity. I have refilled both of these and will have him come back in 1 month for reassessment. Message will be sent to provider covering for PCP concerning PHN and his recent dx. -Continue Semglee  28 units BID. -Resume Trulicity 1.5 mg weekly.  -Continue metformin 1000 mg BID. -Extensively discussed pathophysiology of DM, recommended lifestyle interventions, dietary effects on glycemic control -Counseled on s/sx of and management of hypoglycemia -Next A1C anticipated 05/2021.  Written patient instructions provided.  Total time in face to face counseling 30 minutes.   Follow up w/ me next month.   Butch Penny, PharmD, P[place]y [place], CPP Clinical Pharmacist [facility] [phone]

## 2022-10-11 NOTE — Addendum Note (Signed)
Addended by: Jonah Blue B on: 10/11/2022 05:13 PM   Modules accepted: Orders

## 2022-10-12 ENCOUNTER — Other Ambulatory Visit: Payer: Self-pay

## 2022-10-14 ENCOUNTER — Other Ambulatory Visit: Payer: Self-pay

## 2022-10-27 ENCOUNTER — Emergency Department (HOSPITAL_COMMUNITY)
Admission: EM | Admit: 2022-10-27 | Discharge: 2022-10-27 | Disposition: A | Discharge status: Home / Self Care | Payer: Self-pay | Attending: Emergency Medicine | Admitting: Emergency Medicine

## 2022-10-27 ENCOUNTER — Emergency Department (HOSPITAL_COMMUNITY): Payer: Self-pay

## 2022-10-27 ENCOUNTER — Other Ambulatory Visit: Payer: Self-pay

## 2022-10-27 DIAGNOSIS — Z79899 Other long term (current) drug therapy: Secondary | ICD-10-CM | POA: Insufficient documentation

## 2022-10-27 DIAGNOSIS — Z7982 Long term (current) use of aspirin: Secondary | ICD-10-CM | POA: Insufficient documentation

## 2022-10-27 DIAGNOSIS — M48061 Spinal stenosis, lumbar region without neurogenic claudication: Secondary | ICD-10-CM | POA: Insufficient documentation

## 2022-10-27 DIAGNOSIS — R531 Weakness: Secondary | ICD-10-CM | POA: Insufficient documentation

## 2022-10-27 DIAGNOSIS — I10 Essential (primary) hypertension: Secondary | ICD-10-CM | POA: Insufficient documentation

## 2022-10-27 DIAGNOSIS — Z7984 Long term (current) use of oral hypoglycemic drugs: Secondary | ICD-10-CM | POA: Insufficient documentation

## 2022-10-27 DIAGNOSIS — E1165 Type 2 diabetes mellitus with hyperglycemia: Secondary | ICD-10-CM | POA: Insufficient documentation

## 2022-10-27 DIAGNOSIS — R739 Hyperglycemia, unspecified: Secondary | ICD-10-CM

## 2022-10-27 DIAGNOSIS — Z951 Presence of aortocoronary bypass graft: Secondary | ICD-10-CM | POA: Insufficient documentation

## 2022-10-27 DIAGNOSIS — B0223 Postherpetic polyneuropathy: Secondary | ICD-10-CM | POA: Insufficient documentation

## 2022-10-27 DIAGNOSIS — R29898 Other symptoms and signs involving the musculoskeletal system: Secondary | ICD-10-CM

## 2022-10-27 DIAGNOSIS — Z794 Long term (current) use of insulin: Secondary | ICD-10-CM | POA: Insufficient documentation

## 2022-10-27 LAB — CBC WITH DIFFERENTIAL/PLATELET
Abs Immature Granulocytes: 0 10*3/uL (ref 0.00–0.07)
Basophils Absolute: 0 10*3/uL (ref 0.0–0.1)
Basophils Relative: 1 %
Eosinophils Absolute: 0.3 10*3/uL (ref 0.0–0.5)
Eosinophils Relative: 9 %
HCT: 33.6 % — ABNORMAL LOW (ref 39.0–52.0)
Hemoglobin: 11.2 g/dL — ABNORMAL LOW (ref 13.0–17.0)
Immature Granulocytes: 0 %
Lymphocytes Relative: 37 %
Lymphs Abs: 1.1 10*3/uL (ref 0.7–4.0)
MCH: 31.9 pg (ref 26.0–34.0)
MCHC: 33.3 g/dL (ref 30.0–36.0)
MCV: 95.7 fL (ref 80.0–100.0)
Monocytes Absolute: 0.4 10*3/uL (ref 0.1–1.0)
Monocytes Relative: 12 %
Neutro Abs: 1.2 10*3/uL — ABNORMAL LOW (ref 1.7–7.7)
Neutrophils Relative %: 41 %
Platelets: 277 10*3/uL (ref 150–400)
RBC: 3.51 MIL/uL — ABNORMAL LOW (ref 4.22–5.81)
RDW: 13 % (ref 11.5–15.5)
WBC: 3 10*3/uL — ABNORMAL LOW (ref 4.0–10.5)
nRBC: 0 % (ref 0.0–0.2)

## 2022-10-27 LAB — BASIC METABOLIC PANEL
Anion gap: 8 (ref 5–15)
BUN: 21 mg/dL (ref 8–23)
CO2: 25 mmol/L (ref 22–32)
Calcium: 9 mg/dL (ref 8.9–10.3)
Chloride: 101 mmol/L (ref 98–111)
Creatinine, Ser: 1.19 mg/dL (ref 0.61–1.24)
GFR, Estimated: >60 mL/min (ref >60)
Glucose, Bld: 318 mg/dL — ABNORMAL HIGH (ref 70–99)
Potassium: 4.2 mmol/L (ref 3.5–5.1)
Sodium: 134 mmol/L — ABNORMAL LOW (ref 135–145)

## 2022-10-27 LAB — CBG MONITORING, ED: Glucose-Capillary: 241 mg/dL — ABNORMAL HIGH (ref 70–99)

## 2022-10-27 MED ORDER — GABAPENTIN 100 MG PO CAPS
100.0000 mg | ORAL_CAPSULE | Freq: Three times a day (TID) | ORAL | 0 refills | Status: DC
  Filled 2022-10-27: qty 60, 20d supply, fill #0

## 2022-10-27 MED ORDER — SODIUM CHLORIDE 0.9 % IV BOLUS
1000.0000 mL | Freq: Once | INTRAVENOUS | Status: AC
  Administered 2022-10-27: 1000 mL via INTRAVENOUS

## 2022-10-27 MED ORDER — GABAPENTIN 100 MG PO CAPS
100.0000 mg | ORAL_CAPSULE | Freq: Once | ORAL | Status: AC
  Administered 2022-10-27: 100 mg via ORAL
  Filled 2022-10-27: qty 1

## 2022-10-27 MED ORDER — INSULIN ASPART 100 UNIT/ML IJ SOLN: 5.0000 [IU] | Freq: Once | INTRAMUSCULAR | Status: DC

## 2022-10-27 MED ORDER — VALACYCLOVIR HCL 1 G PO TABS
1000.0000 mg | ORAL_TABLET | Freq: Three times a day (TID) | ORAL | 0 refills | Status: DC
  Filled 2022-10-27: qty 21, 7d supply, fill #0

## 2022-10-27 MED ORDER — ACETAMINOPHEN 500 MG PO TABS
1000.0000 mg | ORAL_TABLET | Freq: Four times a day (QID) | ORAL | 0 refills | Status: DC | PRN
  Filled 2022-10-27: qty 100, 13d supply, fill #0

## 2022-10-27 MED ORDER — MORPHINE SULFATE (PF) 4 MG/ML IV SOLN
4.0000 mg | Freq: Once | INTRAVENOUS | Status: AC
  Administered 2022-10-27: 4 mg via INTRAVENOUS
  Filled 2022-10-27: qty 1

## 2022-10-27 NOTE — ED Notes (Signed)
Patient transported to MRI 

## 2022-10-27 NOTE — Discharge Instructions (Addendum)
You have been eval for your symptoms.  Your left arm pain and weakness is likely due to shingle infection.  Please continue taking medication as prescribed.  Call and follow-up with neurosurgery after 2 to 3 weeks for reevaluation.  ??.  ?? ??????? ?? ???? ????? ?????? ?? ????? ?????? ???? ???? ??????? ????????.  ???? ????????? ?? ????? ?????? ??? ????? ???????.  ??????? ????????? ?? ????? ??????? ??? 2 ??? 3 ?????? ?????? ???????. laqad tama taqyim al'aerad alkhasat bika. min almuhtamal 'an yakun al'alam waldaef fi dhiraeik alyusraa bisabab eadwaa alqawaba' almantiqiati. yurjaa alaistimrar fi tanawul aldawa' ealaa alnahw almwswf. alaitisal walmutabaeat mae jirahat al'aesab baed 2 'iilaa 3 'asabie li'iieadat altaqyimi.

## 2022-10-27 NOTE — ED Notes (Signed)
DC insulin 5 units per PA Greta Doom

## 2022-10-27 NOTE — ED Provider Notes (Signed)
[place] [facility] AT [facility] Provider Note   CSN: [id number] Arrival date & time: 10/27/22  3710     History  Chief Complaint  Patient presents with   Extremity Pain    Other Schnitzer is a 72 y.o. male.  The history is provided by the patient and medical records. The history is limited by a language barrier. A language interpreter was used.  Extremity Pain     72 year old Arabic speaking male with significant history of diabetes, hypertension, CAD status post CABG x 3 presenting to the ED with complaints of arm pain.  Patient was last seen in the ED on 10/09/2022 for having a rash on his left arm and chest and axillary ongoing for 4 days.  Patient was diagnosed with shingles and subsequently discharged home with Valtrex.  Patient mention he has finished with the antiviral medication however he is still having persistent pain throughout his left arm with numbness and weakness to the hand.  He is not able to pick up his spoon when he eats.  Pain is keeping him up at night.  He does not endorse any fever vision changes trouble breathing he denies any recent injury.  Home Medications Prior to Admission medications   Medication Sig Start Date End Date Taking? Authorizing Provider  Accu-Chek Softclix Lancets lancets Use as instructed. Inject into the skin twice daily 09/13/22   Claiborne Rigg, NP  amLODipine (NORVASC) 10 MG tablet Take 1 tablet (10 mg total) by mouth daily. FOR BLOOD PRESSURE 02/21/22   Claiborne Rigg, NP  aspirin EC 81 MG tablet Take 1 tablet (81 mg total) by mouth daily. Swallow whole. 11/17/21   Tonny Bollman, MD  Blood Glucose Monitoring Suppl (ACCU-CHEK GUIDE ME) w/Device KIT Use to check blood sugar 2 times daily. E11.9 02/21/22   Claiborne Rigg, NP  Blood Pressure Monitor DEVI Please provide patient with insurance approved blood pressure monitor 08/06/21   Claiborne Rigg, NP  carvedilol (COREG) 6.25 MG tablet Take 1 tablet  (6.25 mg total) by mouth 2 (two) times daily with a meal. FOR BLOOD PRESSURE. STOP METOPROLOL!! 05/23/22   Claiborne Rigg, NP  diclofenac (VOLTAREN) 75 MG EC tablet Take 1 tablet (75 mg total) by mouth 2 (two) times daily. 11/19/21   Claiborne Rigg, NP  Dulaglutide (TRULICITY) 1.5 MG/0.5ML SOPN Inject 1.5 mg into the skin once a week. FOR DIABETES 10/11/22   Hoy Register, MD  fluticasone (FLONASE) 50 MCG/ACT nasal spray Place 2 sprays into both nostrils daily. 08/24/20   Claiborne Rigg, NP  gabapentin (NEURONTIN) 100 MG capsule Take 2 capsules (200 mg total) by mouth 2 (two) times daily. 10/11/22   Marcine Matar, MD  glucose blood (ACCU-CHEK GUIDE) test strip Use as instructed. Check blood glucose by fingerstick twice per day. 09/13/22   Claiborne Rigg, NP  HYDROcodone-acetaminophen (NORCO/VICODIN) 5-325 MG tablet Take 1 tablet by mouth every 6 (six) hours as needed for severe pain. 10/09/22   Rancour, Jeannett Senior, MD  hydrocortisone cream 0.5 % Apply 1 application topically daily. Apply to lips Patient not taking: Reported on 09/13/2022 11/30/20   Claiborne Rigg, NP  insulin glargine-yfgn (SEMGLEE, YFGN,) 100 UNIT/ML Pen Inject 28 Units into the skin 2 (two) times daily. 10/11/22   Hoy Register, MD  metFORMIN (GLUCOPHAGE) 1000 MG tablet Take 1 tablet (1,000 mg total) by mouth 2 (two) times daily with a meal. For DIABETES. 02/21/22   Claiborne Rigg,  NP  Misc. Devices MISC Please provide patient with an insurance approved QUAD CANE 08/10/17   Claiborne Rigg, NP  nitroGLYCERIN (NITROSTAT) 0.4 MG SL tablet Dissolve 1 tablet under the tongue every 5 minutes as needed for chest pain. Max of 3 doses, then 911. 11/17/21   Tonny Bollman, MD  pregabalin (LYRICA) 75 MG capsule Take 1 capsule (75 mg total) by mouth 2 (two) times daily. 11/19/21   Claiborne Rigg, NP  rosuvastatin (CRESTOR) 20 MG tablet Take 1 tablet (20 mg total) by mouth daily. STOP atorvastatin. 10/11/22   Hoy Register, MD   senna-docusate (SENOKOT-S) 8.6-50 MG tablet Take 2 tablets by mouth daily. FOR CONSTIPATION 02/21/22   Claiborne Rigg, NP  traZODone (DESYREL) 50 MG tablet Take 1-2 tablets (50-100 mg total) by mouth at bedtime. For SLEEP 05/23/22   Claiborne Rigg, NP  triamcinolone (KENALOG) 0.025 % ointment Apply 1 Application topically 2 (two) times daily onto the back of the head Patient not taking: Reported on 09/13/2022 02/21/22   Claiborne Rigg, NP  valACYclovir (VALTREX) 1000 MG tablet Take 1 tablet (1,000 mg total) by mouth 3 (three) times daily. 10/09/22   Rancour, Jeannett Senior, MD  valsartan (DIOVAN) 160 MG tablet Take 1 tablet (160 mg total) by mouth daily. For blood pressure 11/17/21   Tonny Bollman, MD      Allergies    Victoza [liraglutide]    Review of Systems   Review of Systems  All other systems reviewed and are negative.   Physical Exam Updated Vital Signs BP (!) 153/67 (BP Location: Right Arm)   Pulse 64   Temp 98.2 F (36.8 C) (Oral)   Resp 18   Ht 5\' 5"  (1.651 m)   Wt 61 kg   SpO2 97%   BMI 22.38 kg/m  Physical Exam Vitals and nursing note reviewed.  Constitutional:      General: He is not in acute distress.    Appearance: He is well-developed.  HENT:     Head: Atraumatic.  Eyes:     Conjunctiva/sclera: Conjunctivae normal.  Cardiovascular:     Rate and Rhythm: Normal rate and regular rhythm.     Pulses: Normal pulses.     Heart sounds: Normal heart sounds.  Pulmonary:     Effort: Pulmonary effort is normal.     Breath sounds: Normal breath sounds.  Abdominal:     Palpations: Abdomen is soft.  Musculoskeletal:     Cervical back: Neck supple.  Skin:    Findings: Rash (Left upper extremity: Vesicular rash noted along the C7 dermatome.  Decrease sensation to the hand and difficulty making a fist.) present.  Neurological:     Mental Status: He is alert.     ED Results / Procedures / Treatments   Labs (all labs ordered are listed, but only abnormal results are  displayed) Labs Reviewed  CBC WITH DIFFERENTIAL/PLATELET - Abnormal; Notable for the following components:      Result Value   WBC 3.0 (*)    RBC 3.51 (*)    Hemoglobin 11.2 (*)    HCT 33.6 (*)    Neutro Abs 1.2 (*)    All other components within normal limits  BASIC METABOLIC PANEL - Abnormal; Notable for the following components:   Sodium 134 (*)    Glucose, Bld 318 (*)    All other components within normal limits  CBG MONITORING, ED - Abnormal; Notable for the following components:   Glucose-Capillary 241 (*)  All other components within normal limits    EKG None  Radiology MR Cervical Spine Wo Contrast  Result Date: 10/27/2022 CLINICAL DATA:  Left arm weakness EXAM: MRI CERVICAL SPINE WITHOUT CONTRAST TECHNIQUE: Multiplanar, multisequence MR imaging of the cervical spine was performed. No intravenous contrast was administered. COMPARISON:  Cervical spine CT 06/20/2014 FINDINGS: Alignment: Normal. Vertebrae: Vertebral body heights are preserved. Background marrow signal is normal. There is no suspicious marrow signal abnormality or marrow edema. Cord: Normal in signal and morphology. Posterior Fossa, vertebral arteries, paraspinal tissues: The posterior fossa is assessed on the separately dictated brain MRI. The vertebral artery flow voids are normal. The paraspinal soft tissues are unremarkable. Disc levels: Evaluation of the neural foramina is degraded by motion artifact. There is trace fluid in the bilateral atlantoaxial joints, nonspecific but likely degenerative. C3-C4: There is a shallow posterior disc osteophyte complex with right worse than left uncovertebral ridging and mild facet arthropathy resulting in mild spinal canal stenosis and moderate right and mild left neural foraminal stenosis C4-C5: There is right-sided uncovertebral ridging and mild right worse than left facet arthropathy resulting in probable moderate right and no significant left neural foraminal stenosis and no  significant spinal canal stenosis C5-C6: There is a posterior disc osteophyte complex eccentric to the right with bilateral uncovertebral ridging and mild facet arthropathy resulting in moderate spinal canal stenosis with indentation of the ventral cord surface, and moderate to severe left and mild right neural foraminal stenosis C6-C7: There is right worse than left uncovertebral ridging and mild facet arthropathy resulting in probable mild-to-moderate bilateral neural foraminal stenosis and mild spinal canal stenosis C7-T1: There is moderate left and mild right facet arthropathy without significant spinal canal or neural foraminal stenosis. IMPRESSION: 1. Motion degraded images particularly affecting evaluation of the neural foramina. 2. Multilevel uncovertebral ridging and facet arthropathy resulting in moderate right neural foraminal stenosis at C3-C4 and C4-C5, moderate to severe left neural foraminal stenosis at C5-C6, mild-to-moderate bilateral neural foraminal stenosis at C6-C7, and up to moderate spinal canal stenosis at C5-C6 with indentation of the ventral cord surface. No definite cord edema. Electronically Signed   By: Lesia Hausen M.D.   On: 10/27/2022 11:12   MR BRAIN WO CONTRAST  Result Date: 10/27/2022 CLINICAL DATA:  Left arm weakness, can not use left hand for 2 days. EXAM: MRI HEAD WITHOUT CONTRAST TECHNIQUE: Multiplanar, multiecho pulse sequences of the brain and surrounding structures were obtained without intravenous contrast. COMPARISON:  CT head 07/16/2018 FINDINGS: Brain: There is no acute intracranial hemorrhage, extra-axial fluid collection, or acute infarct. There is mild background parenchymal volume loss with prominence of the ventricular system and extra-axial CSF spaces. The ventricles are stable in size since the prior head CT. There is mild for age background chronic small-vessel ischemic change. There is a single punctate chronic microhemorrhage in the left parietal lobe,  nonspecific. The pituitary and suprasellar region are normal. There is no mass lesion. There is no mass effect or midline shift. Vascular: Normal flow voids. Skull and upper cervical spine: Normal marrow signal. Sinuses/Orbits: The paranasal sinuses are clear. Bilateral lens implants are in place. The globes and orbits are otherwise unremarkable. Other: The mastoid air cells and middle ear cavities are clear. IMPRESSION: Essentially normal for age brain MRI. Electronically Signed   By: Lesia Hausen M.D.   On: 10/27/2022 11:04    Procedures Procedures    Medications Ordered in ED Medications  gabapentin (NEURONTIN) capsule 100 mg (100 mg Oral Given 10/27/22  4098)  sodium chloride 0.9 % bolus 1,000 mL (0 mLs Intravenous Stopped 10/27/22 1246)  morphine (PF) 4 MG/ML injection 4 mg (4 mg Intravenous Given 10/27/22 1200)    ED Course/ Medical Decision Making/ A&P                                 Medical Decision Making Amount and/or Complexity of Data Reviewed Labs: ordered. Radiology: ordered.  Risk Prescription drug management.   BP (!) 153/67 (BP Location: Right Arm)   Pulse 64   Temp 98.2 F (36.8 C) (Oral)   Resp 18   Ht 5\' 5"  (1.651 m)   Wt 61 kg   SpO2 97%   BMI 22.38 kg/m   53:96 AM  72 year old Arabic speaking male with significant history of diabetes, hypertension, CAD status post CABG x 3 presenting to the ED with complaints of arm pain.  Patient was last seen in the ED on 10/09/2022 for having a rash on his left arm and chest and axillary ongoing for 4 days.  Patient was diagnosed with shingles and subsequently discharged home with Valtrex.  Patient mention he has finished with the antiviral medication however he is still having persistent pain throughout his left arm with numbness and weakness to the hand.  He is not able to pick up his spoon when he eats.  Pain is keeping him up at night.  He does not endorse any fever vision changes trouble breathing he denies any recent  injury.  On exam this is an elderly male laying in bed appears to be in no acute discomfort.  On occasionally he would wince.  Patient is notable to have vesicular lesions following the left C7 dermatomal pattern extending from his forearm towards his axillary region.  His forearm is tender to palpation with decreased left grip strength but sensation is intact.  Radial pulse 2+.  Forearm compartment is soft.  Patient here with complaints of weakness and pain to his left upper extremities after being diagnosed with shingles and have to finish a full course of Valtrex.  I suspect his pain is likely neuropathy in the setting of his infection.  I have low suspicion for acute stroke.  No recent injury therefore low suspicion for fracture or dislocation.  No cellulitic skin changes.  Imaging including brain MRI and cervical spine MRI considered but not performed as I have low suspicion for an acute stroke causing his symptoms.  I suspect his symptoms more likely to be posthepatic neuralgia with a component of weakness secondary to inflammation involving the extension of the anterior horn motor cells of the spinal cord.  Patient also has significant history of diabetes therefore high-dose steroid is contraindicated.  I appreciate consultation from on-call neurologist Dr. Derry Lory who recommends brain and cervical spine MRI without contrast to rule out  Patient given gabapentin.  Anticipate discharging home with gabapentin and Percocet for symptom control.  -Labs ordered, independently viewed and interpreted by me.  Labs remarkable for CBG 318, improves to 241 with IVF, WBC 3.0 -The patient was maintained on a cardiac monitor.  I personally viewed and interpreted the cardiac monitored which showed an underlying rhythm of: sinus bradycardia -Imaging independently viewed and interpreted by me and I agree with radiologist's interpretation.  Result remarkable for MRI of brain and cervical spine showing  multilevel neural foraminal stenosis.  No cord edema -This patient presents to the ED for concern of  left arm weakness, this involves an extensive number of treatment options, and is a complaint that carries with it a high risk of complications and morbidity.  The differential diagnosis includes post herpetic neuropathy, neuropraxia, stroke, cord compression, spinal stenosis -Co morbidities that complicate the patient evaluation includes diabetes, shingles -Treatment includes IVF, gabapentin -Reevaluation of the patient after these medicines showed that the patient improved -PCP office notes or outside notes reviewed -Discussion with specialist neurosurgery PA who recommend conservative treatment with gabapentin and opiate medication along with treatment for shingles.  Avoid steroid use.  Outpt f/u with neurosurgery in 2-3 weeks.  I discussed this with the patient using in which interpreter.  He will follow-up as appropriate. -Escalation to admission/observation considered: patients feels much better, is comfortable with discharge, and will follow up with neurosurgery -Prescription medication considered, patient comfortable with gabapentin, valacyclovir  -Social Determinant of Health considered which includes language barrier,          Final Clinical Impression(s) / ED Diagnoses Final diagnoses:  Left arm weakness  Post-herpetic polyneuropathy  Spinal stenosis of lumbar region, unspecified whether neurogenic claudication present  Hyperglycemia    Rx / DC Orders ED Discharge Orders          Ordered    gabapentin (NEURONTIN) 100 MG capsule  3 times daily        10/27/22 1300    valACYclovir (VALTREX) 1000 MG tablet  3 times daily        10/27/22 1300    acetaminophen (TYLENOL) 500 MG tablet  Every 6 hours PRN        10/27/22 1300              Fayrene Helper, PA-C 10/27/22 1302    Terald Sleeper, MD 10/27/22 [phone]

## 2022-10-27 NOTE — ED Notes (Signed)
Patient discharged by this RN, instructions reviewed, patient given opportunity to ask questions or clarify. No questions for this RN. Patient ambulatory, going home POV.

## 2022-10-27 NOTE — ED Triage Notes (Addendum)
Pt arrives POV with complaints of rash on left arm and not being able to use left hand x2 days. Pt is has weaker grip on left hand. Pt has pain radiating up to left shoulder x 10 days.  Pt discharged with dx and medication for shingles on 7/21

## 2022-10-27 NOTE — ED Notes (Addendum)
Patient ambulated to restroom with minimal assistance. ?

## 2022-10-31 ENCOUNTER — Ambulatory Visit (HOSPITAL_COMMUNITY)
Admission: EM | Admit: 2022-10-31 | Discharge: 2022-10-31 | Disposition: A | Discharge status: Home / Self Care | Payer: Self-pay | Attending: Internal Medicine | Admitting: Internal Medicine

## 2022-10-31 ENCOUNTER — Encounter (HOSPITAL_COMMUNITY): Payer: Self-pay | Admitting: *Deleted

## 2022-10-31 ENCOUNTER — Other Ambulatory Visit: Payer: Self-pay

## 2022-10-31 DIAGNOSIS — E11628 Type 2 diabetes mellitus with other skin complications: Secondary | ICD-10-CM

## 2022-10-31 DIAGNOSIS — B028 Zoster with other complications: Secondary | ICD-10-CM

## 2022-10-31 DIAGNOSIS — Z794 Long term (current) use of insulin: Secondary | ICD-10-CM

## 2022-10-31 DIAGNOSIS — B0229 Other postherpetic nervous system involvement: Secondary | ICD-10-CM

## 2022-10-31 MED ORDER — DEXAMETHASONE SODIUM PHOSPHATE 10 MG/ML IJ SOLN
10.0000 mg | Freq: Once | INTRAMUSCULAR | Status: AC
  Administered 2022-10-31: 10 mg via INTRAMUSCULAR

## 2022-10-31 MED ORDER — DEXAMETHASONE SODIUM PHOSPHATE 10 MG/ML IJ SOLN
INTRAMUSCULAR | Status: AC
  Filled 2022-10-31: qty 1

## 2022-10-31 MED ORDER — HYDROCODONE-ACETAMINOPHEN 5-325 MG PO TABS
1.0000 | ORAL_TABLET | Freq: Every evening | ORAL | 0 refills | Status: DC | PRN
Start: 2022-10-31 — End: 2023-02-07
  Filled 2022-10-31: qty 6, 6d supply, fill #0

## 2022-10-31 MED ORDER — MUPIROCIN 2 % EX OINT
1.0000 | TOPICAL_OINTMENT | Freq: Two times a day (BID) | CUTANEOUS | 0 refills | Status: DC
  Filled 2022-10-31: qty 22, 11d supply, fill #0

## 2022-10-31 NOTE — ED Triage Notes (Signed)
Pt reports during triage he takes same meds for BP and diabetes. Pt does not want to answer questions about meds.

## 2022-10-31 NOTE — ED Provider Notes (Signed)
[facility]    CSN: [id number] Arrival date & time: 10/31/22  1202      History   Chief Complaint Chief Complaint  Patient presents with   Skin Problem    HPI Carl Clark is a 72 y.o. male.   Patient presents to urgent care for evaluation of pain and weakness to the left arm secondary to postherpetic neuralgia that started 2 days ago.  Patient was diagnosed with herpes zoster to the left arm/C7 dermatome on October 09, 2022.  He was treated with Valtrex but symptoms persisted.  He was seen in the emergency department 4 days ago where he was given another prescription for course of Valtrex and gabapentin for pain.  He has been taking medications as prescribed and states they are not helping with his pain.  He has not slept well due to pain for the last 2 nights. Complains of weakness and tightness to the left hand. Pain starts at the left shoulder and radiates down the arm into the left hand over rash. No drainage from rash, pus, itching, or warmth/erythema to the surrounding rash. No recent fevers, chills, body aches, N/V, or trauma/injuries to the left arm. No chest pain or shortness of breath. History of type 2 diabetes, blood sugars have been running in the 150s-200s. No recent antibiotic/steroid use.   The history is provided by the patient. The history is limited by a language barrier. A language interpreter was used (Metallurgist interpreter used for entirety of patient encounter).    Past Medical History:  Diagnosis Date   CAD (coronary artery disease) 03/16/2017   S/p NSTEMI 6/18 >> LHC: multivessel CAD >> s/p CABG // Echo 6/18: Mild LVH, EF 60-65, normal wall motion, grade 1 diastolic dysfunction, normal RV SF, PASP 36 (borderline pulmonary hypertension) // Pre-CABG Dopplers 09/13/16: Bilateral ICA 1-39   Echocardiogram    Echo 09/2018: EF 60-65, normal wall motion, RVSP 24.9   Family history of anesthesia complication    "never had anesthesia"  (07/14/2012)   History of non-ST elevation myocardial infarction (NSTEMI) 09/12/2016   Treated with CABG   History of rectal bleeding    secondary to diverticulum affecting right hepatic flexure - 06/2012   Hypertension    Mild carotid artery disease (HCC)    a. 1-39% bilaterally preCABG in 2018.   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   History of non-ST elevation myocardial infarction (NSTEMI) 09/12/2016   Rectal bleeding 07/13/2012   Diabetes mellitus (HCC) 07/13/2012   Essential hypertension 07/13/2012    Past Surgical History:  Procedure Laterality Date   COLONOSCOPY Left 07/14/2012   Procedure: COLONOSCOPY;  Surgeon: Willis Modena, MD;  Location: [facility] ENDOSCOPY;  Service: Endoscopy;  Laterality: Left;   CORONARY ARTERY BYPASS GRAFT N/A 09/15/2016   Procedure: CORONARY ARTERY BYPASS GRAFTING (CABG) x 3, LIMA to LAD, SVG to DIAGONAL, SVG to OM2, USING LEFT MAMMARY ARTERY AND RIGHT GREATER SAPHENOUS VEIN HARVESTED  ENDOSCOPICALLY;  Surgeon: Delight Ovens, MD;  Location: [facility] OR;  Service: Open Heart Surgery;  Laterality: N/A;   ESOPHAGOGASTRODUODENOSCOPY N/A 07/13/2012   Procedure: ESOPHAGOGASTRODUODENOSCOPY (EGD);  Surgeon: Willis Modena, MD;  Location: [facility] ENDOSCOPY;  Service: Endoscopy;  Laterality: N/A;  pt in ED A8   LEFT HEART CATH AND CORONARY ANGIOGRAPHY N/A 09/12/2016   Procedure: Left Heart Cath and Coronary Angiography;  Surgeon: Tonny Bollman, MD;  Location: [facility] INVASIVE CV LAB;  Service: Cardiovascular;  Laterality: N/A;   NO PAST SURGERIES     TEE WITHOUT CARDIOVERSION N/A 09/15/2016   Procedure: TRANSESOPHAGEAL ECHOCARDIOGRAM (TEE);  Surgeon: Delight Ovens, MD;  Location: [facility] OR;  Service: Open Heart Surgery;  Laterality: N/A;       Home Medications    Prior to Admission medications   Medication Sig Start Date End Date Taking? Authorizing Provider  Accu-Chek Softclix Lancets lancets Use as instructed. Inject into the skin twice daily 09/13/22  Yes Claiborne Rigg, NP  amLODipine (NORVASC) 10 MG tablet Take 1 tablet (10 mg total) by mouth daily. FOR BLOOD PRESSURE 02/21/22  Yes Claiborne Rigg, NP  aspirin EC 81 MG tablet Take 1 tablet (81 mg total) by mouth daily. Swallow whole. 11/17/21  Yes Tonny Bollman, MD  Blood Glucose Monitoring Suppl (ACCU-CHEK GUIDE ME) w/Device KIT Use to check blood sugar 2 times daily. E11.9 02/21/22  Yes Claiborne Rigg, NP  Blood Pressure Monitor DEVI Please provide patient with insurance approved blood pressure monitor 08/06/21  Yes Claiborne Rigg, NP  carvedilol (COREG) 6.25 MG tablet Take 1 tablet (6.25 mg total) by mouth 2 (two) times daily with a meal. FOR BLOOD PRESSURE. STOP METOPROLOL!! 05/23/22  Yes Claiborne Rigg, NP  glucose blood (ACCU-CHEK GUIDE) test strip Use as instructed. Check blood glucose by fingerstick twice per day. 09/13/22  Yes Claiborne Rigg, NP  HYDROcodone-acetaminophen (NORCO/VICODIN) 5-325 MG tablet Take 1 tablet by mouth at bedtime as needed. 10/31/22  Yes Basir Niven, Donavan Burnet, FNP  insulin glargine-yfgn (SEMGLEE, YFGN,) 100 UNIT/ML Pen Inject 28 Units into the skin 2 (two) times daily. 10/11/22  Yes Hoy Register, MD  metFORMIN (GLUCOPHAGE) 1000 MG tablet Take 1 tablet (1,000 mg total) by mouth 2 (two) times daily with a meal. For DIABETES. 02/21/22  Yes Claiborne Rigg, NP  mupirocin ointment (BACTROBAN) 2 % Apply 1 Application topically 2 (two) times  daily. 10/31/22  Yes Carlisle Beers, FNP  valACYclovir (VALTREX) 1000 MG tablet Take 1 tablet (1,000 mg total) by mouth 3 (three) times daily. 10/27/22  Yes Fayrene Helper, PA-C  acetaminophen (TYLENOL) 500 MG tablet Take 2 tablets (1,000 mg total) by mouth every 6 (six) hours as needed for moderate pain. 10/27/22   Fayrene Helper, PA-C  diclofenac (VOLTAREN) 75 MG EC tablet Take 1 tablet (75 mg total) by mouth 2 (two) times daily. 11/19/21   Claiborne Rigg, NP  Dulaglutide (TRULICITY) 1.5 MG/0.5ML SOPN Inject 1.5 mg into the skin once a week. FOR DIABETES 10/11/22   Hoy Register, MD  fluticasone (FLONASE) 50 MCG/ACT nasal spray Place 2 sprays into both nostrils daily. 08/24/20   Claiborne Rigg, NP  gabapentin (NEURONTIN) 100 MG capsule Take 1 capsule (100 mg total) by mouth 3 (three) times daily. 10/27/22   Fayrene Helper, PA-C  hydrocortisone cream 0.5 % Apply 1 application topically daily. Apply to lips Patient not taking: Reported on 09/13/2022 11/30/20   Claiborne Rigg, NP  Misc. Devices MISC Please  provide patient with an insurance approved QUAD CANE 08/10/17   Claiborne Rigg, NP  nitroGLYCERIN (NITROSTAT) 0.4 MG SL tablet Dissolve 1 tablet under the tongue every 5 minutes as needed for chest pain. Max of 3 doses, then 911. 11/17/21   Tonny Bollman, MD  pregabalin (LYRICA) 75 MG capsule Take 1 capsule (75 mg total) by mouth 2 (two) times daily. 11/19/21   Claiborne Rigg, NP  rosuvastatin (CRESTOR) 20 MG tablet Take 1 tablet (20 mg total) by mouth daily. STOP atorvastatin. 10/11/22   Hoy Register, MD  senna-docusate (SENOKOT-S) 8.6-50 MG tablet Take 2 tablets by mouth daily. FOR CONSTIPATION 02/21/22   Claiborne Rigg, NP  traZODone (DESYREL) 50 MG tablet Take 1-2 tablets (50-100 mg total) by mouth at bedtime. For SLEEP 05/23/22   Claiborne Rigg, NP  triamcinolone (KENALOG) 0.025 % ointment Apply 1 Application topically 2 (two) times daily onto the back of the head Patient not taking: Reported on  09/13/2022 02/21/22   Claiborne Rigg, NP  valsartan (DIOVAN) 160 MG tablet Take 1 tablet (160 mg total) by mouth daily. For blood pressure 11/17/21   Tonny Bollman, MD    Family History Family History  Problem Relation Age of Onset   Diabetes Mother    Diabetes Father     Social History Social History   Tobacco Use   Smoking status: Never   Smokeless tobacco: Never  Vaping Use   Vaping status: Never Used  Substance Use Topics   Alcohol use: No   Drug use: No     Allergies   Victoza [liraglutide]   Review of Systems Review of Systems Per HPI  Physical Exam Triage Vital Signs ED Triage Vitals  Encounter Vitals Group     BP 10/31/22 1419 (!) 170/76     Systolic BP Percentile --      Diastolic BP Percentile --      Pulse Rate 10/31/22 1419 (!) 57     Resp 10/31/22 1419 20     Temp 10/31/22 1419 98.3 F (36.8 C)     Temp src --      SpO2 10/31/22 1419 94 %     Weight --      Height --      Head Circumference --      Peak Flow --      Pain Score 10/31/22 1412 10     Pain Loc --      Pain Education --      Exclude from Growth Chart --    No data found.  Updated Vital Signs BP (!) 170/76   Pulse (!) 57   Temp 98.3 F (36.8 C)   Resp 20   SpO2 94%   Visual Acuity Right Eye Distance:   Left Eye Distance:   Bilateral Distance:    Right Eye Near:   Left Eye Near:    Bilateral Near:     Physical Exam Vitals and nursing note reviewed.  Constitutional:      Appearance: He is not ill-appearing or toxic-appearing.  HENT:     Head: Normocephalic and atraumatic.     Right Ear: Hearing and external ear normal.     Left Ear: Hearing and external ear normal.     Nose: Nose normal.     Mouth/Throat:     Lips: Pink.     Mouth: Mucous membranes are moist. No injury.     Tongue: No lesions. Tongue does not deviate from midline.  Palate: No mass and lesions.     Pharynx: Oropharynx is clear. Uvula midline. No pharyngeal swelling, oropharyngeal exudate,  posterior oropharyngeal erythema or uvula swelling.     Tonsils: No tonsillar exudate or tonsillar abscesses.  Eyes:     General: Lids are normal. Vision grossly intact. Gaze aligned appropriately.     Extraocular Movements: Extraocular movements intact.     Conjunctiva/sclera: Conjunctivae normal.  Cardiovascular:     Rate and Rhythm: Normal rate and regular rhythm.     Heart sounds: Normal heart sounds, S1 normal and S2 normal.  Pulmonary:     Effort: Pulmonary effort is normal. No respiratory distress.     Breath sounds: Normal breath sounds and air entry.  Musculoskeletal:     Cervical back: Neck supple.  Skin:    General: Skin is warm and dry.     Capillary Refill: Capillary refill takes less than 2 seconds.     Findings: Rash (Crusted rash to the left arm among the C7 dermatome as seen in images below. Subtle warmth to surrounding erythema without warmth, tenderness to palpation, or pus.) present.     Comments: +2 bilateral radial pulses with less than 2 cap refill.  Neurological:     General: No focal deficit present.     Mental Status: He is alert and oriented to person, place, and time. Mental status is at baseline.     Cranial Nerves: No dysarthria or facial asymmetry.  Psychiatric:        Mood and Affect: Mood normal.        Speech: Speech normal.        Behavior: Behavior normal.        Thought Content: Thought content normal.        Judgment: Judgment normal.    Left arm rash   Left arm rash    UC Treatments / Results  Labs (all labs ordered are listed, but only abnormal results are displayed) Labs Reviewed - No data to display  EKG   Radiology No results found.  Procedures Procedures (including critical care time)  Medications Ordered in UC Medications  dexamethasone (DECADRON) injection 10 mg (10 mg Intramuscular Given 10/31/22 1539)    Initial Impression / Assessment and Plan / UC Course  I have reviewed the triage vital signs and the nursing  notes.  Pertinent labs & imaging results that were available during my care of the patient were reviewed by me and considered in my medical decision making (see chart for details).   1. Herpes zoster with other complication, post herpetic neuralgia Evaluation consistent with healing herpes zoster rash with significant nerve pain. No signs of secondary bacterial infection, however will cover with mupirocin BID for 7 days as patient is immunosuppressed with type 2 DM.   Dexamethasone IM 10mg  for nerve pain, this may increase sugars temporarily, patient will continue to monitor sugars at home.  PDMP reviewed, 1 tablet of hydrocodone-acetaminophen at bedtime as needed for pain. Drowsiness precautions discussed, 6 tablets sent to pharmacy to be taken as needed at bedtime.  Reviewed imaging, blood work, and workup in the ED on 10/27/22. Advised to continue gabapentin and valtrex.   Walking referral to [facility] given for follow-up in 3-5 days.   Counseled patient on potential for adverse effects with medications prescribed/recommended today, strict ER and return-to-clinic precautions discussed, patient verbalized understanding.    Final Clinical Impressions(s) / UC Diagnoses   Final diagnoses:  Herpes zoster with other complication  Post herpetic neuralgia     Discharge Instructions      I gave you a shot of steroid to calm down pain and inflammation. You may take 1 pill of hydrocodone-acetaminophen at bedtime to reduce pain at nighttime so that you can sleep. Only take this pill at bedtime and only use for severe pain.  Apply mupirocin ointment to wounds twice a day for 7 days.  Continue valtrex and gabapentin as needed.  Schedule an appointment with [facility] as soon as possible.   If you develop any new or worsening symptoms or if your symptoms do not start to improve, please return here or follow-up with your primary care provider. If your  symptoms are severe, please go to the emergency room.    ED Prescriptions     Medication Sig Dispense Auth. Provider   HYDROcodone-acetaminophen (NORCO/VICODIN) 5-325 MG tablet Take 1 tablet by mouth at bedtime as needed. 6 tablet Carlisle Beers, FNP   mupirocin ointment (BACTROBAN) 2 % Apply 1 Application topically 2 (two) times daily. 22 g Carlisle Beers, FNP      I have reviewed the PDMP during this encounter.   Carlisle Beers, [place] 10/31/22 1725

## 2022-10-31 NOTE — ED Triage Notes (Signed)
Pt reports this is his 3rd time he has come to be seen for shingles on Lt arm. Pt reports the whole arm is in very bad condition. Information via interpreter .

## 2022-10-31 NOTE — Discharge Instructions (Addendum)
I gave you a shot of steroid to calm down pain and inflammation. You may take 1 pill of hydrocodone-acetaminophen at bedtime to reduce pain at nighttime so that you can sleep. Only take this pill at bedtime and only use for severe pain.  Apply mupirocin ointment to wounds twice a day for 7 days.  Continue valtrex and gabapentin as needed.  Schedule an appointment with [facility] as soon as possible.   If you develop any new or worsening symptoms or if your symptoms do not start to improve, please return here or follow-up with your primary care provider. If your symptoms are severe, please go to the emergency room.

## 2022-11-07 ENCOUNTER — Encounter: Payer: Self-pay | Admitting: Physician Assistant

## 2022-11-07 ENCOUNTER — Other Ambulatory Visit: Payer: Self-pay

## 2022-11-07 ENCOUNTER — Ambulatory Visit: Payer: Self-pay | Admitting: *Deleted

## 2022-11-07 ENCOUNTER — Ambulatory Visit: Payer: Self-pay | Attending: Physician Assistant | Admitting: Physician Assistant

## 2022-11-07 VITALS — BP 187/79 | HR 76 | Wt 125.0 lb

## 2022-11-07 DIAGNOSIS — Z794 Long term (current) use of insulin: Secondary | ICD-10-CM

## 2022-11-07 DIAGNOSIS — B0229 Other postherpetic nervous system involvement: Secondary | ICD-10-CM

## 2022-11-07 DIAGNOSIS — Z603 Acculturation difficulty: Secondary | ICD-10-CM

## 2022-11-07 DIAGNOSIS — B029 Zoster without complications: Secondary | ICD-10-CM

## 2022-11-07 DIAGNOSIS — R29898 Other symptoms and signs involving the musculoskeletal system: Secondary | ICD-10-CM

## 2022-11-07 DIAGNOSIS — Z09 Encounter for follow-up examination after completed treatment for conditions other than malignant neoplasm: Secondary | ICD-10-CM

## 2022-11-07 DIAGNOSIS — E1165 Type 2 diabetes mellitus with hyperglycemia: Secondary | ICD-10-CM

## 2022-11-07 DIAGNOSIS — Z758 Other problems related to medical facilities and other health care: Secondary | ICD-10-CM

## 2022-11-07 LAB — GLUCOSE, POCT (MANUAL RESULT ENTRY)
POC Glucose: 596 mg/dl — AB (ref 70–99)
POC Glucose: >600 mg/dl (ref 70–99)
POC Glucose: 532 mg/dl — AB (ref 70–99)

## 2022-11-07 LAB — POCT URINALYSIS DIP (CLINITEK)
Bilirubin, UA: NEGATIVE
Glucose, UA: 500 mg/dL — AB
Ketones, POC UA: NEGATIVE mg/dL
Leukocytes, UA: NEGATIVE
Nitrite, UA: NEGATIVE
POC PROTEIN,UA: NEGATIVE
Spec Grav, UA: 1.015 (ref 1.010–1.025)
Urobilinogen, UA: 0.2 E.U./dL
pH, UA: 6 (ref 5.0–8.0)

## 2022-11-07 MED ORDER — GABAPENTIN 100 MG PO CAPS
200.0000 mg | ORAL_CAPSULE | Freq: Three times a day (TID) | ORAL | 1 refills | Status: DC
Start: 2022-11-07 — End: 2022-11-11
  Filled 2022-11-07: qty 120, 20d supply, fill #0

## 2022-11-07 MED ORDER — KETOROLAC TROMETHAMINE 60 MG/2ML IM SOLN
60.0000 mg | Freq: Once | INTRAMUSCULAR | Status: AC
Start: 2022-11-07 — End: 2022-11-07
  Administered 2022-11-07: 60 mg via INTRAMUSCULAR

## 2022-11-07 MED ORDER — INSULIN ASPART 100 UNIT/ML IJ SOLN
10.0000 [IU] | Freq: Once | INTRAMUSCULAR | Status: AC
Start: 2022-11-07 — End: 2022-11-07
  Administered 2022-11-07: 10 [IU] via SUBCUTANEOUS

## 2022-11-07 MED ORDER — SODIUM CHLORIDE 0.9 % IV BOLUS
1000.0000 mL | Freq: Once | INTRAVENOUS | Status: DC
Start: 2022-11-07 — End: 2022-11-07

## 2022-11-07 MED ORDER — INSULIN ASPART 100 UNIT/ML IJ SOLN
15.0000 [IU] | Freq: Once | INTRAMUSCULAR | Status: AC
Start: 2022-11-07 — End: 2022-11-07
  Administered 2022-11-07: 15 [IU] via SUBCUTANEOUS

## 2022-11-07 MED ORDER — SODIUM CHLORIDE 0.9 % IV BOLUS
1000.0000 mL | Freq: Once | INTRAVENOUS | Status: AC
Start: 2022-11-07 — End: ?

## 2022-11-07 NOTE — Patient Instructions (Signed)
Please drink 80 ounces water daily.  Take your medications as directed  Check your blood sugars fasting and at bedtime

## 2022-11-07 NOTE — Progress Notes (Signed)
Patient ID: Carl Clark, male   DOB: 04/19/1950, 72 y.o.   MRN: [id number]    Jamarri Doring, is a 72 y.o. male  DDU:[id number]  CBJ:[id number]  DOB - [birth year]  Chief Complaint  Patient presents with   Dizziness       Subjective:   Carl Clark is a 72 y.o. male here today for a follow up visit Seen and diagnosed with shingles 10/09/2022.  Then /12 for R/O stroke.  He has been having L arm weakness for about 40 days.  Decreased grip and strength.  He has completed valtrex is still having L arm pain and burning/stinging.  He stopped taking his basal insulin 5 days ago.  He never picked up trulicity. He has been having dizziness and poor appetite but he did eat just before he came. His nephew is with him and he lives with a different nephew that is not here but will help with medications and compliance.  He drinks a lot of sugary drinks   No problems updated.  ALLERGIES: Allergies  Allergen Reactions   Victoza [Liraglutide] Swelling    Lip swelling, itching, rash    PAST MEDICAL HISTORY: Past Medical History:  Diagnosis Date   CAD (coronary artery disease) 03/16/2017   S/p NSTEMI 6/18 >> LHC: multivessel CAD >> s/p CABG // Echo 6/18: Mild LVH, EF 60-65, normal wall motion, grade 1 diastolic dysfunction, normal RV SF, PASP 36 (borderline pulmonary hypertension) // Pre-CABG Dopplers 09/13/16: Bilateral ICA 1-39   Echocardiogram    Echo 09/2018: EF 60-65, normal wall motion, RVSP 24.9   Family history of anesthesia complication    "never had anesthesia" (07/14/2012)   History of non-ST elevation myocardial infarction (NSTEMI) 09/12/2016   Treated with CABG   History of rectal bleeding    secondary to diverticulum affecting right hepatic flexure - 06/2012   Hypertension    Mild carotid artery disease (HCC)    a. 1-39% bilaterally preCABG in 2018.   Type II diabetes mellitus (HCC)     MEDICATIONS AT HOME: Prior to Admission medications   Medication Sig Start  Date End Date Taking? Authorizing Provider  Accu-Chek Softclix Lancets lancets Use as instructed. Inject into the skin twice daily 09/13/22   Claiborne Rigg, NP  acetaminophen (TYLENOL) 500 MG tablet Take 2 tablets (1,000 mg total) by mouth every 6 (six) hours as needed for moderate pain. 10/27/22   Fayrene Helper, PA-C  amLODipine (NORVASC) 10 MG tablet Take 1 tablet (10 mg total) by mouth daily. FOR BLOOD PRESSURE 02/21/22   Claiborne Rigg, NP  aspirin EC 81 MG tablet Take 1 tablet (81 mg total) by mouth daily. Swallow whole. 11/17/21   Tonny Bollman, MD  Blood Glucose Monitoring Suppl (ACCU-CHEK GUIDE ME) w/Device KIT Use to check blood sugar 2 times daily. E11.9 02/21/22   Claiborne Rigg, NP  Blood Pressure Monitor DEVI Please provide patient with insurance approved blood pressure monitor 08/06/21   Claiborne Rigg, NP  carvedilol (COREG) 6.25 MG tablet Take 1 tablet (6.25 mg total) by mouth 2 (two) times daily with a meal. FOR BLOOD PRESSURE. STOP METOPROLOL!! 05/23/22   Claiborne Rigg, NP  diclofenac (VOLTAREN) 75 MG EC tablet Take 1 tablet (75 mg total) by mouth 2 (two) times daily. 11/19/21   Claiborne Rigg, NP  Dulaglutide (TRULICITY) 1.5 MG/0.5ML SOPN Inject 1.5 mg into the skin once a week. FOR DIABETES 10/11/22   Hoy Register, MD  fluticasone (FLONASE) 50 MCG/ACT nasal  spray Place 2 sprays into both nostrils daily. 08/24/20   Claiborne Rigg, NP  gabapentin (NEURONTIN) 100 MG capsule Take 2 capsules (200 mg total) by mouth 3 (three) times daily as needed for shingles pain. 11/07/22   Anders Simmonds, PA-C  glucose blood (ACCU-CHEK GUIDE) test strip Use as instructed. Check blood glucose by fingerstick twice per day. 09/13/22   Claiborne Rigg, NP  HYDROcodone-acetaminophen (NORCO/VICODIN) 5-325 MG tablet Take 1 tablet by mouth at bedtime as needed. 10/31/22   Carlisle Beers, FNP  hydrocortisone cream 0.5 % Apply 1 application topically daily. Apply to lips Patient not taking:  Reported on 09/13/2022 11/30/20   Claiborne Rigg, NP  insulin glargine-yfgn (SEMGLEE, YFGN,) 100 UNIT/ML Pen Inject 28 Units into the skin 2 (two) times daily. 10/11/22   Hoy Register, MD  metFORMIN (GLUCOPHAGE) 1000 MG tablet Take 1 tablet (1,000 mg total) by mouth 2 (two) times daily with a meal. For DIABETES. 02/21/22   Claiborne Rigg, NP  Misc. Devices MISC Please provide patient with an insurance approved QUAD CANE 08/10/17   Claiborne Rigg, NP  mupirocin ointment (BACTROBAN) 2 % Apply 1 Application topically 2 (two) times daily. 10/31/22   Carlisle Beers, FNP  nitroGLYCERIN (NITROSTAT) 0.4 MG SL tablet Dissolve 1 tablet under the tongue every 5 minutes as needed for chest pain. Max of 3 doses, then 911. 11/17/21   Tonny Bollman, MD  rosuvastatin (CRESTOR) 20 MG tablet Take 1 tablet (20 mg total) by mouth daily. STOP atorvastatin. 10/11/22   Hoy Register, MD  senna-docusate (SENOKOT-S) 8.6-50 MG tablet Take 2 tablets by mouth daily. FOR CONSTIPATION 02/21/22   Claiborne Rigg, NP  traZODone (DESYREL) 50 MG tablet Take 1-2 tablets (50-100 mg total) by mouth at bedtime. For SLEEP 05/23/22   Claiborne Rigg, NP  triamcinolone (KENALOG) 0.025 % ointment Apply 1 Application topically 2 (two) times daily onto the back of the head Patient not taking: Reported on 09/13/2022 02/21/22   Claiborne Rigg, NP  valACYclovir (VALTREX) 1000 MG tablet Take 1 tablet (1,000 mg total) by mouth 3 (three) times daily. 10/27/22   Fayrene Helper, PA-C  valsartan (DIOVAN) 160 MG tablet Take 1 tablet (160 mg total) by mouth daily. For blood pressure 11/17/21   Tonny Bollman, MD    ROS: Neg HEENT Neg resp Neg cardiac Neg GI Neg GU Neg MS Neg psych Neg neuro  Objective:   Vitals:   11/07/22 1436 11/07/22 1657  BP: (!) 195/74 (!) 187/79  Pulse: 76   SpO2: 94%   Weight: 125 lb (56.7 kg)    Exam General appearance : Awake, alert, not in any distress. Speech Clear. Not toxic looking HEENT: Atraumatic  and Normocephali Neck: Supple, no JVD. No cervical lymphadenopathy.  Chest: Good air entry bilaterally, CTAB.  No rales/rhonchi/wheezing CVS: S1 S2 regular, no murmurs.  Decreased grip LUE Extremities: B/L Lower Ext shows no edema, both legs are warm to touch Neurology: Awake alert, and oriented X 3, CN II-XII intact, Non focal Skin: No Rash  Data Review Lab Results  Component Value Date   HGBA1C 13.5 (H) 09/13/2022   HGBA1C 12.8 (A) 09/13/2022   HGBA1C 7.6 (H) 05/23/2022    Assessment & Plan   1. Herpes zoster without complication And gabpentin increased and sent - ketorolac (TORADOL) injection 60 mg  2. Type 2 diabetes mellitus with hyperglycemia, with long-term current use of insulin (HCC) Uncontolled-non adherent.  Nephew agrees to ensure compliance -  insulin aspart (novoLOG) injection 15 Units - POCT URINALYSIS DIP (CLINITEK) - sodium chloride 0.9 % bolus 1,000 mL - sodium chloride 0.9 % bolus 1,000 mL - POCT glucose (manual entry) - POCT glucose (manual entry) - insulin aspart (novoLOG) injection 10 Units - POCT glucose (manual entry)  3. Post herpetic neuralgia Gabpentin 200mg  tid - Ambulatory referral to Neurology  4. Left arm weakness - Ambulatory referral to Neurology  5. Language barrier Refused AMN and nephew interpretedand additional time performing visit was required.  6. Hospital follow up   Return in about 1 month (around 12/08/2022) for [facility] for blood sugars; 3 months with PCP for chronic conditions.  The patient was given clear instructions to go to ER or return to medical center if symptoms don't improve, worsen or new problems develop. The patient verbalized understanding. The patient was told to call to get lab results if they haven't heard anything in the next week.      Georgian Co, PA-C [facility] [place], [place] [phone]   11/07/2022, 5:20 PM

## 2022-11-07 NOTE — Addendum Note (Signed)
Addended by: Elsie Lincoln F on: 11/07/2022 06:57 PM   Modules accepted: Orders

## 2022-11-07 NOTE — Telephone Encounter (Signed)
  Chief Complaint: Dizziness Symptoms: Spinning vertigo, fell last night, states no injuries.Seen in ED 10/31/22, shingles.States he told provider of dizziness. States worse sitting to standing. Reports does not check BP "It might be my blood sugar", does not check BS. Frequency: LAst night Pertinent Negatives: Patient denies  Disposition: [] ED /[] Urgent Care (no appt availability in office) / [x] Appointment(In office/virtual)/ []  [place] [facility]/ [] Home Care/ [] Refused Recommended Disposition /[] [place] [facility]/ []  Follow-up with PCP Additional Notes: Secured appt for today. Care advise provided, pt verbalizes understanding.  Reason for Disposition  [1] MODERATE dizziness (e.g., vertigo; feels very unsteady, interferes with normal activities) AND [2] has NOT been evaluated by doctor (or NP/PA) for this  Answer Assessment - Initial Assessment Questions 1. DESCRIPTION: "Describe your dizziness."     Spinning 2. VERTIGO: "Do you feel like either you or the room is spinning or tilting?"      Yes 3. LIGHTHEADED: "Do you feel lightheaded?" (e.g., somewhat faint, woozy, weak upon standing)     Yes 4. SEVERITY: "How bad is it?"  "Can you walk?"   - MILD: Feels slightly dizzy and unsteady, but is walking normally.   - MODERATE: Feels unsteady when walking, but not falling; interferes with normal activities (e.g., school, work).   - SEVERE: Unable to walk without falling, or requires assistance to walk without falling.     Fell last night, hit face 5. ONSET:  "When did the dizziness begin?"      6. AGGRAVATING FACTORS: "Does anything make it worse?" (e.g., standing, change in head position)     Sitting to standing 7. CAUSE: "What do you think is causing the dizziness?"     Maybe "Blood sugar" 8. RECURRENT SYMPTOM: "Have you had dizziness before?" If Yes, ask: "When was the last time?" "What happened that time?"     *No Answer* 9. OTHER SYMPTOMS: "Do you have any other symptoms?"  (e.g., headache, weakness, numbness, vomiting, earache)     *No Answer*  Protocols used: Dizziness - Vertigo-A-AH

## 2022-11-09 ENCOUNTER — Other Ambulatory Visit: Payer: Self-pay

## 2022-11-09 ENCOUNTER — Other Ambulatory Visit: Payer: Self-pay | Admitting: Nurse Practitioner

## 2022-11-09 DIAGNOSIS — R0981 Nasal congestion: Secondary | ICD-10-CM

## 2022-11-09 MED ORDER — FLUTICASONE PROPIONATE 50 MCG/ACT NA SUSP
2.0000 | Freq: Every day | NASAL | 6 refills | Status: DC
  Filled 2022-11-09: qty 16, 30d supply, fill #0
  Filled 2022-12-13: qty 16, 30d supply, fill #1
  Filled 2023-02-07: qty 16, 30d supply, fill #2

## 2022-11-09 MED ORDER — MUPIROCIN 2 % EX OINT
1.0000 | TOPICAL_OINTMENT | Freq: Two times a day (BID) | CUTANEOUS | 0 refills | Status: DC
  Filled 2022-11-09: qty 22, 11d supply, fill #0

## 2022-11-10 ENCOUNTER — Other Ambulatory Visit: Payer: Self-pay

## 2022-11-11 ENCOUNTER — Other Ambulatory Visit: Payer: Self-pay

## 2022-11-11 ENCOUNTER — Encounter (HOSPITAL_COMMUNITY): Payer: Self-pay

## 2022-11-11 ENCOUNTER — Ambulatory Visit (HOSPITAL_COMMUNITY)
Admission: EM | Admit: 2022-11-11 | Discharge: 2022-11-11 | Disposition: A | Discharge status: Home / Self Care | Payer: Self-pay | Attending: Emergency Medicine | Admitting: Emergency Medicine

## 2022-11-11 DIAGNOSIS — B0229 Other postherpetic nervous system involvement: Secondary | ICD-10-CM

## 2022-11-11 MED ORDER — GABAPENTIN 300 MG PO CAPS
300.0000 mg | ORAL_CAPSULE | Freq: Three times a day (TID) | ORAL | 1 refills | Status: AC
Start: 2022-11-11 — End: ?

## 2022-11-11 MED ORDER — CAPSAICIN 0.025 % EX CREA: TOPICAL_CREAM | Freq: Four times a day (QID) | CUTANEOUS | 0 refills | Status: DC | PRN

## 2022-11-11 NOTE — ED Provider Notes (Signed)
[facility]    CSN: [id number] Arrival date & time: 11/11/22  1425      History   Chief Complaint Chief Complaint  Patient presents with   Hand Pain    HPI Carl Clark is a 72 y.o. male.   Patient presents with concerns of persistent pain in his left arm. He was diagnosed with shingles on 7/21. He was given Valtrex which he completed but continues to have pain in his left arm where the rash was as well as some weakness in his left hand. The rash is healing. He has been seen by the ER and his PCP since initial diagnosis and given a Toradol injection, steroid injection, and a few Norco all without much improvement. He has also been started on gabapentin, initially 100mg  tid and now up to 200mg  tid, which he states doesn't seem to help. The patient has been referred to neurology for his ongoing pain as well as weakness and states he missed their call to schedule and will call them Monday. He states the worst part of the pain is the impact on his ability to sleep. He states the pain has not changed but is not getting better.  The history is provided by the patient and a relative. The history is limited by a language barrier (pt relative interpretted as needed, declined interpreter services).  Hand Pain Pertinent negatives include no headaches.    Past Medical History:  Diagnosis Date   CAD (coronary artery disease) 03/16/2017   S/p NSTEMI 6/18 >> LHC: multivessel CAD >> s/p CABG // Echo 6/18: Mild LVH, EF 60-65, normal wall motion, grade 1 diastolic dysfunction, normal RV SF, PASP 36 (borderline pulmonary hypertension) // Pre-CABG Dopplers 09/13/16: Bilateral ICA 1-39   Echocardiogram    Echo 09/2018: EF 60-65, normal wall motion, RVSP 24.9   Family history of anesthesia complication    "never had anesthesia" (07/14/2012)   History of non-ST elevation myocardial infarction (NSTEMI) 09/12/2016   Treated with CABG   History of rectal bleeding    secondary to  diverticulum affecting right hepatic flexure - 06/2012   Hypertension    Mild carotid artery disease (HCC)    a. 1-39% bilaterally preCABG in 2018.   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   History of non-ST elevation myocardial infarction (NSTEMI) 09/12/2016   Rectal bleeding 07/13/2012   Diabetes mellitus (HCC) 07/13/2012   Essential hypertension 07/13/2012    Past Surgical History:  Procedure Laterality Date   COLONOSCOPY Left 07/14/2012   Procedure: COLONOSCOPY;  Surgeon: Willis Modena, MD;  Location: [facility] ENDOSCOPY;  Service: Endoscopy;  Laterality: Left;   CORONARY ARTERY BYPASS GRAFT N/A 09/15/2016   Procedure: CORONARY ARTERY BYPASS GRAFTING (CABG) x 3, LIMA to LAD, SVG to DIAGONAL, SVG to OM2, USING LEFT MAMMARY ARTERY AND RIGHT GREATER SAPHENOUS VEIN HARVESTED ENDOSCOPICALLY;  Surgeon: Delight Ovens, MD;  Location: [facility] OR;  Service: Open Heart Surgery;  Laterality: N/A;  ESOPHAGOGASTRODUODENOSCOPY N/A 07/13/2012   Procedure: ESOPHAGOGASTRODUODENOSCOPY (EGD);  Surgeon: Willis Modena, MD;  Location: [facility] ENDOSCOPY;  Service: Endoscopy;  Laterality: N/A;  pt in ED A8   LEFT HEART CATH AND CORONARY ANGIOGRAPHY N/A 09/12/2016   Procedure: Left Heart Cath and Coronary Angiography;  Surgeon: Tonny Bollman, MD;  Location: [facility] INVASIVE CV LAB;  Service: Cardiovascular;  Laterality: N/A;   NO PAST SURGERIES     TEE WITHOUT CARDIOVERSION N/A 09/15/2016    Procedure: TRANSESOPHAGEAL ECHOCARDIOGRAM (TEE);  Surgeon: Delight Ovens, MD;  Location: [facility] OR;  Service: Open Heart Surgery;  Laterality: N/A;       Home Medications    Prior to Admission medications   Medication Sig Start Date End Date Taking? Authorizing Provider  capsaicin (ZOSTRIX) 0.025 % cream Apply topically 4 (four) times daily as needed. 11/11/22  Yes Arionna Hoggard L, PA  gabapentin (NEURONTIN) 300 MG capsule Take 1-2 capsules (300-600 mg total) by mouth 3 (three) times daily. 11/11/22  Yes Deloris Mittag L, PA  Accu-Chek Softclix Lancets lancets Use as instructed. Inject into the skin twice daily 09/13/22   Claiborne Rigg, NP  acetaminophen (TYLENOL) 500 MG tablet Take 2 tablets (1,000 mg total) by mouth every 6 (six) hours as needed for moderate pain. 10/27/22   Fayrene Helper, PA-C  amLODipine (NORVASC) 10 MG tablet Take 1 tablet (10 mg total) by mouth daily. FOR BLOOD PRESSURE 02/21/22   Claiborne Rigg, NP  aspirin EC 81 MG tablet Take 1 tablet (81 mg total) by mouth daily. Swallow whole. 11/17/21   Tonny Bollman, MD  Blood Glucose Monitoring Suppl (ACCU-CHEK GUIDE ME) w/Device KIT Use to check blood sugar 2 times daily. E11.9 02/21/22   Claiborne Rigg, NP  Blood Pressure Monitor DEVI Please provide patient with insurance approved blood pressure monitor 08/06/21   Claiborne Rigg, NP  carvedilol (COREG) 6.25 MG tablet Take 1 tablet (6.25 mg total) by mouth 2 (two) times daily with a meal. FOR BLOOD PRESSURE. STOP METOPROLOL!! 05/23/22   Claiborne Rigg, NP  diclofenac (VOLTAREN) 75 MG EC tablet Take 1 tablet (75 mg total) by mouth 2 (two) times daily. 11/19/21   Claiborne Rigg, NP  Dulaglutide (TRULICITY) 1.5 MG/0.5ML SOPN Inject 1.5 mg into the skin once a week. FOR DIABETES 10/11/22   Hoy Register, MD  fluticasone (FLONASE) 50 MCG/ACT nasal spray Place 2 sprays into both nostrils daily. 11/09/22   Hoy Register, MD  glucose blood (ACCU-CHEK GUIDE) test strip Use as instructed.  Check blood glucose by fingerstick twice per day. 09/13/22   Claiborne Rigg, NP  HYDROcodone-acetaminophen (NORCO/VICODIN) 5-325 MG tablet Take 1 tablet by mouth at bedtime as needed. 10/31/22   Carlisle Beers, FNP  hydrocortisone cream 0.5 % Apply 1 application topically daily. Apply to lips Patient not taking: Reported on 09/13/2022 11/30/20   Claiborne Rigg, NP  insulin glargine-yfgn (SEMGLEE, YFGN,) 100 UNIT/ML Pen Inject 28 Units into the skin 2 (two) times daily. 10/11/22   Hoy Register, MD  metFORMIN (GLUCOPHAGE) 1000 MG tablet Take 1 tablet (1,000 mg total) by mouth 2 (two) times daily with a meal. For DIABETES. 02/21/22   Claiborne Rigg, NP  Misc. Devices MISC Please provide patient with an insurance approved QUAD CANE 08/10/17   Claiborne Rigg, NP  mupirocin ointment (BACTROBAN) 2 % Apply 1 Application topically 2 (two) times daily. 11/09/22   Hoy Register, MD  nitroGLYCERIN (NITROSTAT) 0.4 MG SL tablet Dissolve 1 tablet  under the tongue every 5 minutes as needed for chest pain. Max of 3 doses, then 911. 11/17/21   Tonny Bollman, MD  rosuvastatin (CRESTOR) 20 MG tablet Take 1 tablet (20 mg total) by mouth daily. STOP atorvastatin. 10/11/22   Hoy Register, MD  senna-docusate (SENOKOT-S) 8.6-50 MG tablet Take 2 tablets by mouth daily. FOR CONSTIPATION 02/21/22   Claiborne Rigg, NP  traZODone (DESYREL) 50 MG tablet Take 1-2 tablets (50-100 mg total) by mouth at bedtime. For SLEEP 05/23/22   Claiborne Rigg, NP  triamcinolone (KENALOG) 0.025 % ointment Apply 1 Application topically 2 (two) times daily onto the back of the head Patient not taking: Reported on 09/13/2022 02/21/22   Claiborne Rigg, NP  valACYclovir (VALTREX) 1000 MG tablet Take 1 tablet (1,000 mg total) by mouth 3 (three) times daily. 10/27/22   Fayrene Helper, PA-C  valsartan (DIOVAN) 160 MG tablet Take 1 tablet (160 mg total) by mouth daily. For blood pressure 11/17/21   Tonny Bollman, MD    Family History Family  History  Problem Relation Age of Onset   Diabetes Mother    Diabetes Father     Social History Social History   Tobacco Use   Smoking status: Never   Smokeless tobacco: Never  Vaping Use   Vaping status: Never Used  Substance Use Topics   Alcohol use: No   Drug use: No     Allergies   Victoza [liraglutide]   Review of Systems Review of Systems  Constitutional:  Negative for fatigue and fever.  Musculoskeletal:  Positive for myalgias.  Skin:  Positive for rash (healing).  Neurological:  Positive for weakness. Negative for dizziness and headaches.     Physical Exam Triage Vital Signs ED Triage Vitals [11/11/22 1640]  Encounter Vitals Group     BP (!) 170/75     Systolic BP Percentile      Diastolic BP Percentile      Pulse Rate 64     Resp 20     Temp 98.1 F (36.7 C)     Temp Source Oral     SpO2 96 %     Weight      Height      Head Circumference      Peak Flow      Pain Score 10     Pain Loc      Pain Education      Exclude from Growth Chart    No data found.  Updated Vital Signs BP (!) 170/75 (BP Location: Right Arm)   Pulse 64   Temp 98.1 F (36.7 C) (Oral)   Resp 20   SpO2 96%   Visual Acuity Right Eye Distance:   Left Eye Distance:   Bilateral Distance:    Right Eye Near:   Left Eye Near:    Bilateral Near:     Physical Exam Vitals and nursing note reviewed.  Constitutional:      General: He is not in acute distress. Eyes:     Pupils: Pupils are equal, round, and reactive to light.  Cardiovascular:     Rate and Rhythm: Normal rate and regular rhythm.     Heart sounds: Normal heart sounds.  Pulmonary:     Effort: Pulmonary effort is normal.     Breath sounds: Normal breath sounds.  Skin:    Comments: Multiple healing lesions to left arm from previous blisters. No tenderness.   Neurological:     Mental Status: He  is alert.  Psychiatric:        Mood and Affect: Mood normal.      UC Treatments / Results  Labs (all labs  ordered are listed, but only abnormal results are displayed) Labs Reviewed - No data to display  EKG   Radiology No results found.  Procedures Procedures (including critical care time)  Medications Ordered in UC Medications - No data to display  Initial Impression / Assessment and Plan / UC Course  I have reviewed the triage vital signs and the nursing notes.  Pertinent labs & imaging results that were available during my care of the patient were reviewed by me and considered in my medical decision making (see chart for details).     Ongoing post-herpetic neuralgia symptoms, no changes to indicate new cause of pain requiring work-up at this time. Discussed that 300mg  tid gabapentin is often required to have improvement, often higher doses, and suspect the 200mg  is not enough. Will increase to 300mg  tid and advised can try taking two (600mg ) at night to see if this assists with sleeping. Will also send in capsaicin cream to try. Pt to follow-up with neurology.   E/M: 1 chronic illness, no data, moderate risk due to prescription management  Final Clinical Impressions(s) / UC Diagnoses   Final diagnoses:  Post herpetic neuralgia     Discharge Instructions      Start new dosage of the gabapentin. Take one capsule in the morning, one midday, and can try two at night to see if this helps with sleep. Try cream as prescribed. Follow-up with neurology.     ED Prescriptions     Medication Sig Dispense Auth. Provider   gabapentin (NEURONTIN) 300 MG capsule Take 1-2 capsules (300-600 mg total) by mouth 3 (three) times daily. 90 capsule Ciarrah Rae L, PA   capsaicin (ZOSTRIX) 0.025 % cream Apply topically 4 (four) times daily as needed. 60 g Vallery Sa, Rocket Gunderson L, [place]      PDMP not reviewed this encounter.   Estanislado Pandy, [place] 11/11/22 1714

## 2022-11-11 NOTE — ED Triage Notes (Signed)
Pt states had shingles to lt arm for over a month. Pt c/o severe pain to lt hand and hasn't slept in 4 days d/t pain. States here on 8/12 and given medication that didn't help.

## 2022-11-11 NOTE — Discharge Instructions (Signed)
Start new dosage of the gabapentin. Take one capsule in the morning, one midday, and can try two at night to see if this helps with sleep. Try cream as prescribed. Follow-up with neurology.

## 2022-11-11 NOTE — Progress Notes (Unsigned)
    S:    PCP: [name]  No chief complaint on file.  Patient arrives in good spirits.  Presents for diabetes management at the request of [name]. Patient was referred on 09/13/2022. His A1c at that visit was up from 7.6 prior to 13.5%. Pt gave reasoning of dietary indiscretion and medication non-adherence. Last pharmacist visit on 10/11/22. Pt reported significant pain d/t shingles diagnosed ~2 days prior. Medication non-adherence reported. Review of his fill history indicates he last picked-up Trulicity 02/21/2022. Last picked-up a 27-day supply of Semglee on 04/28/2022. Given his non-adherence, Trulicity and Semglee were refilled. Counseled on importance of taking medications as prescribed and schedule for 1 month follow up to reassess.  Today ***    Family/Social History:  - FHx: DM (mother, father) - Tobacco: never smoker - Alcohol: reports occasional use   Insurance coverage/medication affordability: none  Patient denies adherence with medications.  Current diabetes medications include:  - Semglee 28u BID  - Metformin 1000 mg BID - Trulicity 1.5 mg weekly  Patient denies hypoglycemic events.  Patient reported dietary habits:  - He does not limit carbohydrates  Patient-reported exercise habits:  - Does not exercise outside of work   Patient endorses polyuria. Denies polydipsia.  Patient denies neuropathy. Patient denies visual changes. Patient reports self foot exams.   O:  Home CBG ranges:  - Fasting: no meter today. Tells me his blood sugars are staying high.  Lab Results  Component Value Date   HGBA1C 13.5 (H) 09/13/2022   There were no vitals filed for this visit.  Lipid Panel     Component Value Date/Time   CHOL 118 08/06/2021 1051   TRIG 127 08/06/2021 1051   HDL 34 (L) 08/06/2021 1051   CHOLHDL 3.5 08/06/2021 1051   CHOLHDL 4.8 09/12/2016 0806   VLDL 29 09/12/2016 0806   LDLCALC 61 08/06/2021 1051   Clinical ASCVD: Yes - CAD, NSTEMI/CABG The ASCVD Risk  score (Arnett DK, et al., 2019) failed to calculate for the following reasons:   The valid total cholesterol range is 130 to 320 mg/dL   A/P: Diabetes longstanding currently uncontrolled secondary to non-adherence. Patient is able to verbalize appropriate hypoglycemia management plan. Patient is NOT compliant with insulin or Trulicity. I have refilled both of these and will have him come back in 1 month for reassessment. Message will be sent to provider covering for PCP concerning PHN and his recent dx. -Continue Semglee  28 units BID. -Resume Trulicity 1.5 mg weekly.  -Continue metformin 1000 mg BID. -Extensively discussed pathophysiology of DM, recommended lifestyle interventions, dietary effects on glycemic control -Counseled on s/sx of and management of hypoglycemia -Next A1C anticipated 05/2021.  Written patient instructions provided.  Total time in face to face counseling 30 minutes.   Follow up w/ me next month.   Butch Penny, PharmD, P[place]y [place], CPP Clinical Pharmacist [facility] [phone]

## 2022-11-14 ENCOUNTER — Ambulatory Visit: Payer: Self-pay | Attending: Nurse Practitioner | Admitting: Pharmacist

## 2022-11-14 ENCOUNTER — Other Ambulatory Visit: Payer: Self-pay

## 2022-11-14 ENCOUNTER — Encounter: Payer: Self-pay | Admitting: Pharmacist

## 2022-11-14 ENCOUNTER — Other Ambulatory Visit: Payer: Self-pay | Admitting: Pharmacist

## 2022-11-14 DIAGNOSIS — E1165 Type 2 diabetes mellitus with hyperglycemia: Secondary | ICD-10-CM

## 2022-11-14 DIAGNOSIS — Z7985 Long-term (current) use of injectable non-insulin antidiabetic drugs: Secondary | ICD-10-CM

## 2022-11-14 DIAGNOSIS — Z7984 Long term (current) use of oral hypoglycemic drugs: Secondary | ICD-10-CM

## 2022-11-14 DIAGNOSIS — Z794 Long term (current) use of insulin: Secondary | ICD-10-CM

## 2022-11-14 MED ORDER — BASAGLAR KWIKPEN 100 UNIT/ML ~~LOC~~ SOPN
28.0000 [IU] | PEN_INJECTOR | Freq: Two times a day (BID) | SUBCUTANEOUS | 2 refills | Status: DC
  Filled 2022-11-14: qty 15, 27d supply, fill #0
  Filled 2022-12-13: qty 15, 27d supply, fill #1

## 2022-11-15 ENCOUNTER — Encounter: Payer: Self-pay | Admitting: Diagnostic Neuroimaging

## 2022-11-15 ENCOUNTER — Encounter: Payer: Self-pay | Admitting: Neurology

## 2022-11-15 ENCOUNTER — Other Ambulatory Visit: Payer: Self-pay

## 2022-11-15 ENCOUNTER — Ambulatory Visit (INDEPENDENT_AMBULATORY_CARE_PROVIDER_SITE_OTHER): Payer: Self-pay | Admitting: Diagnostic Neuroimaging

## 2022-11-15 VITALS — BP 99/46 | HR 61 | Ht 65.0 in | Wt 148.0 lb

## 2022-11-15 DIAGNOSIS — B0229 Other postherpetic nervous system involvement: Secondary | ICD-10-CM

## 2022-11-15 DIAGNOSIS — G540 Brachial plexus disorders: Secondary | ICD-10-CM

## 2022-11-15 MED ORDER — GABAPENTIN 300 MG PO CAPS
900.0000 mg | ORAL_CAPSULE | Freq: Three times a day (TID) | ORAL | 6 refills | Status: DC
Start: 2022-11-15 — End: 2023-02-07
  Filled 2022-11-15: qty 270, 30d supply, fill #0

## 2022-11-15 MED ORDER — DULOXETINE HCL 60 MG PO CPEP
60.0000 mg | ORAL_CAPSULE | Freq: Every day | ORAL | 3 refills | Status: DC
  Filled 2022-11-15: qty 30, 30d supply, fill #0
  Filled 2022-12-13: qty 30, 30d supply, fill #1

## 2022-11-15 NOTE — Patient Instructions (Signed)
LEFT ARM POST HERPETIC NEURALGIA (since ~ mid July 2024; some signs of brachial plexopathy) - increase gabapentin to 900mg  three times a day; if not working, then consider pregabalin up to 150mg  three times a day  - add duloxetine 60mg  daily - refer to OT evaluation

## 2022-11-15 NOTE — Progress Notes (Signed)
[facility]  PATIENT: Carl Clark DOB: December 08, 1950  REFERRING CLINICIAN: Anders Simmonds, PA-C HISTORY FROM: patient and friend (via interpreter) REASON FOR VISIT: new consult   HISTORICAL  CHIEF COMPLAINT:  Chief Complaint  Patient presents with   Follow-up    Patient in room #6 with a good friend and the interpreter. Patient states he having weakness and pain down his left arm.    HISTORY OF PRESENT ILLNESS:   72 year old male here for evaluation of left arm pain.  History of hypertension, diabetes, coronary artery disease, carotid artery disease.  Approximate 2 months ago patient had onset of rash in his left upper extremity from his left shoulder down to his left forearm.  This was associated with severe pain and weakness.  He went to emergency room and was diagnosed with shingles.  He was treated with valacyclovir.  Continue to have postherpetic neuralgia pain.  Also developed weakness in his left upper extremity.  At the time his diabetes was also significantly elevated.  He has tried gabapentin 60 mg 3 times a day without relief.  Also has significant weakness in his left arm and left grip strength.  No problems with right upper extremity.  No problems with lower extremities.   REVIEW OF SYSTEMS: Full 14 system review of systems performed and negative with exception of: as per HPI.  ALLERGIES: Allergies  Allergen Reactions   Victoza [Liraglutide] Swelling    Lip swelling, itching, rash    HOME MEDICATIONS: Outpatient Medications Prior to Visit  Medication Sig Dispense Refill   Accu-Chek Softclix Lancets lancets Use as instructed. Inject into the skin twice daily 100 each 3   acetaminophen (TYLENOL) 500 MG tablet Take 2 tablets (1,000 mg total) by mouth every 6 (six) hours as needed for moderate pain. 100 tablet 0   amLODipine (NORVASC) 10 MG tablet Take 1 tablet (10 mg total) by mouth daily. FOR BLOOD PRESSURE 90 tablet 1   aspirin  EC 81 MG tablet Take 1 tablet (81 mg total) by mouth daily. Swallow whole. 90 tablet 3   Blood Glucose Monitoring Suppl (ACCU-CHEK GUIDE ME) w/Device KIT Use to check blood sugar 2 times daily. E11.9 1 kit 0   Blood Pressure Monitor DEVI Please provide patient with insurance approved blood pressure monitor 1 each 0   capsaicin (ZOSTRIX) 0.025 % cream Apply topically 4 (four) times daily as needed. 60 g 0   carvedilol (COREG) 6.25 MG tablet Take 1 tablet (6.25 mg total) by mouth 2 (two) times daily with a meal. FOR BLOOD PRESSURE. STOP METOPROLOL!! 180 tablet 1   diclofenac (VOLTAREN) 75 MG EC tablet Take 1 tablet (75 mg total) by mouth 2 (two) times daily. 60 tablet 2   Dulaglutide (TRULICITY) 1.5 MG/0.5ML SOPN Inject 1.5 mg into the skin once a week. FOR DIABETES 6 mL 1   fluticasone (FLONASE) 50 MCG/ACT nasal spray Place 2 sprays into both nostrils daily. 16 g 6   glucose blood (ACCU-CHEK GUIDE) test strip Use as instructed. Check blood glucose by fingerstick twice per day. 100 each 12   HYDROcodone-acetaminophen (NORCO/VICODIN) 5-325 MG tablet Take 1 tablet by mouth at bedtime as needed. 6 tablet 0   hydrocortisone cream 0.5 % Apply 1 application topically daily. Apply to lips 30 g 1   Insulin Glargine (BASAGLAR KWIKPEN) 100 UNIT/ML Inject 28 Units into the skin 2 (two) times daily. 15 mL 2   metFORMIN (GLUCOPHAGE) 1000 MG tablet Take 1 tablet (1,000 mg total)  by mouth 2 (two) times daily with a meal. For DIABETES. 180 tablet 1   Misc. Devices MISC Please provide patient with an insurance approved QUAD CANE 1 each 0   mupirocin ointment (BACTROBAN) 2 % Apply 1 Application topically 2 (two) times daily. 22 g 0   nitroGLYCERIN (NITROSTAT) 0.4 MG SL tablet Dissolve 1 tablet under the tongue every 5 minutes as needed for chest pain. Max of 3 doses, then 911. 25 tablet 6   rosuvastatin (CRESTOR) 20 MG tablet Take 1 tablet (20 mg total) by mouth daily. STOP atorvastatin. 90 tablet 3   senna-docusate  (SENOKOT-S) 8.6-50 MG tablet Take 2 tablets by mouth daily. FOR CONSTIPATION 90 tablet 3   traZODone (DESYREL) 50 MG tablet Take 1-2 tablets (50-100 mg total) by mouth at bedtime. For SLEEP 180 tablet 1   triamcinolone (KENALOG) 0.025 % ointment Apply 1 Application topically 2 (two) times daily onto the back of the head 454 g 1   valACYclovir (VALTREX) 1000 MG tablet Take 1 tablet (1,000 mg total) by mouth 3 (three) times daily. 21 tablet 0   valsartan (DIOVAN) 160 MG tablet Take 1 tablet (160 mg total) by mouth daily. For blood pressure 90 tablet 3   gabapentin (NEURONTIN) 300 MG capsule Take 1-2 capsules (300-600 mg total) by mouth 3 (three) times daily. 90 capsule 1   Facility-Administered Medications Prior to Visit  Medication Dose Route Frequency Provider Last Rate Last Admin   sodium chloride 0.9 % bolus 1,000 mL  1,000 mL Intravenous Once Anders Simmonds, PA-C   Stopped at 11/07/22 1730    PAST MEDICAL HISTORY: Past Medical History:  Diagnosis Date   CAD (coronary artery disease) 03/16/2017   S/p NSTEMI 6/18 >> LHC: multivessel CAD >> s/p CABG // Echo 6/18: Mild LVH, EF 60-65, normal wall motion, grade 1 diastolic dysfunction, normal RV SF, PASP 36 (borderline pulmonary hypertension) // Pre-CABG Dopplers 09/13/16: Bilateral ICA 1-39   Echocardiogram    Echo 09/2018: EF 60-65, normal wall motion, RVSP 24.9   Family history of anesthesia complication    "never had anesthesia" (07/14/2012)   History of non-ST elevation myocardial infarction (NSTEMI) 09/12/2016   Treated with CABG   History of rectal bleeding    secondary to diverticulum affecting right hepatic flexure - 06/2012   Hypertension    Mild carotid artery disease (HCC)    a. 1-39% bilaterally preCABG in 2018.   Type II diabetes mellitus (HCC)     PAST SURGICAL HISTORY: Past Surgical History:  Procedure Laterality Date   COLONOSCOPY Left 07/14/2012   Procedure: COLONOSCOPY;  Surgeon: Willis Modena, MD;  Location: [facility]  ENDOSCOPY;  Service: Endoscopy;  Laterality: Left;   CORONARY ARTERY BYPASS GRAFT N/A 09/15/2016   Procedure: CORONARY ARTERY BYPASS GRAFTING (CABG) x 3, LIMA to LAD, SVG to DIAGONAL, SVG to OM2, USING LEFT MAMMARY ARTERY AND RIGHT GREATER SAPHENOUS VEIN HARVESTED ENDOSCOPICALLY;  Surgeon: Delight Ovens, MD;  Location: [facility] OR;  Service: Open Heart Surgery;  Laterality: N/A;   ESOPHAGOGASTRODUODENOSCOPY N/A 07/13/2012   Procedure: ESOPHAGOGASTRODUODENOSCOPY (EGD);  Surgeon: Willis Modena, MD;  Location: [facility] ENDOSCOPY;  Service: Endoscopy;  Laterality: N/A;  pt in ED A8   LEFT HEART CATH AND CORONARY ANGIOGRAPHY N/A 09/12/2016   Procedure: Left Heart Cath and Coronary Angiography;  Surgeon: Tonny Bollman, MD;  Location: [facility] INVASIVE CV LAB;  Service: Cardiovascular;  Laterality: N/A;   NO PAST SURGERIES     TEE WITHOUT CARDIOVERSION N/A 09/15/2016   Procedure:  TRANSESOPHAGEAL ECHOCARDIOGRAM (TEE);  Surgeon: Delight Ovens, MD;  Location: [facility] OR;  Service: Open Heart Surgery;  Laterality: N/A;    FAMILY HISTORY: Family History  Problem Relation Age of Onset   Diabetes Mother    Diabetes Father     SOCIAL HISTORY: Social History   Socioeconomic History   Marital status: Widowed    Spouse name: Not on file   Number of children: Not on file   Years of education: Not on file   Highest education level: Not on file  Occupational History   Not on file  Tobacco Use   Smoking status: Never   Smokeless tobacco: Never  Vaping Use   Vaping status: Never Used  Substance and Sexual Activity   Alcohol use: No   Drug use: No   Sexual activity: Not Currently  Other Topics Concern   Not on file  Social History Narrative   Not on file   Social Determinants of Health   Financial Resource Strain: Not on file  Food Insecurity: Not on file  Transportation Needs: Not on file  Physical Activity: Not on file  Stress: Not on file  Social Connections: Not on file  Intimate Partner Violence:  Not on file     PHYSICAL EXAM  GENERAL EXAM/CONSTITUTIONAL: Vitals:  Vitals:   11/15/22 1329  BP: (!) 99/46  Pulse: 61  Weight: 148 lb (67.1 kg)  Height: 5\' 5"  (1.651 m)   Body mass index is 24.63 kg/m. Wt Readings from Last 3 Encounters:  11/15/22 148 lb (67.1 kg)  11/07/22 125 lb (56.7 kg)  10/27/22 134 lb 7.7 oz (61 kg)   Patient is in no distress; well developed, nourished and groomed; neck is supple  CARDIOVASCULAR: Examination of carotid arteries is normal; no carotid bruits Regular rate and rhythm, no murmurs Examination of peripheral vascular system by observation and palpation is normal  EYES: Ophthalmoscopic exam of optic discs and posterior segments is normal; no papilledema or hemorrhages No results found.  MUSCULOSKELETAL: Gait, strength, tone, movements noted in Neurologic exam below  NEUROLOGIC: MENTAL STATUS:      No data to display         awake, alert, oriented to person, place and time recent and remote memory intact normal attention and concentration language fluent, comprehension intact, naming intact fund of knowledge appropriate  CRANIAL NERVE:  2nd - no papilledema on fundoscopic exam 2nd, 3rd, 4th, 6th - pupils equal and reactive to light, visual fields full to confrontation, extraocular muscles intact, no nystagmus 5th - facial sensation symmetric 7th - facial strength symmetric 8th - hearing intact 9th - palate elevates symmetrically, uvula midline 11th - shoulder shrug symmetric 12th - tongue protrusion midline  MOTOR:  normal bulk and tone, full strength in the RUE, BLE LLE --> WEAKNESS IN DELTOID 4, BICEPS 3, TRICEPS 3, GRIP 2-3, FINGER ABDUCTION 2-3; ATROPHY OF LEFT HAND INTRINSIC MUSCLES  SENSORY:  normal and symmetric to light touch, temperature, vibration  COORDINATION:  finger-nose-finger, fine finger movements normal  REFLEXES:  deep tendon reflexes TRACE and symmetric  GAIT/STATION:  narrow based  gait     DIAGNOSTIC DATA (LABS, IMAGING, TESTING) - I reviewed patient records, labs, notes, testing and imaging myself where available.  Lab Results  Component Value Date   WBC 3.0 (L) 10/27/2022   HGB 11.2 (L) 10/27/2022   HCT 33.6 (L) 10/27/2022   MCV 95.7 10/27/2022   PLT 277 10/27/2022      Component Value Date/Time  NA 134 (L) 10/27/2022 0825   NA 137 09/13/2022 1006   K 4.2 10/27/2022 0825   CL 101 10/27/2022 0825   CO2 25 10/27/2022 0825   GLUCOSE 318 (H) 10/27/2022 0825   BUN 21 10/27/2022 0825   BUN 14 09/13/2022 1006   CREATININE 1.19 10/27/2022 0825   CALCIUM 9.0 10/27/2022 0825   PROT 7.7 09/13/2022 1006   ALBUMIN 3.9 09/13/2022 1006   AST 13 09/13/2022 1006   ALT 11 09/13/2022 1006   ALKPHOS 79 09/13/2022 1006   BILITOT 0.5 09/13/2022 1006   GFRNONAA >60 10/27/2022 0825   GFRAA 80 01/06/2020 0930   Lab Results  Component Value Date   CHOL 118 08/06/2021   HDL 34 (L) 08/06/2021   LDLCALC 61 08/06/2021   TRIG 127 08/06/2021   CHOLHDL 3.5 08/06/2021   Lab Results  Component Value Date   HGBA1C 13.5 (H) 09/13/2022   Lab Results  Component Value Date   VITAMINB12 368 02/02/2022   Lab Results  Component Value Date   TSH 1.790 04/19/2017     10/27/22  Essentially normal for age brain MRI.   10/27/22 MRI cervical spine 1. Motion degraded images particularly affecting evaluation of the neural foramina. 2. Multilevel uncovertebral ridging and facet arthropathy resulting in moderate right neural foraminal stenosis at C3-C4 and C4-C5, moderate to severe left neural foraminal stenosis at C5-C6, mild-to-moderate bilateral neural foraminal stenosis at C6-C7, and up to moderate spinal canal stenosis at C5-C6 with indentation of the ventral cord surface. No definite cord edema.     ASSESSMENT AND PLAN  72 y.o. year old male here with:   Dx:  1. Post herpetic neuralgia   2. Brachial plexopathy     PLAN:  LEFT ARM POST HERPETIC NEURALGIA  (pain and weakness; since ~ mid July 2024; some signs of diabetic or post-herpetic brachial plexopathy) - increase gabapentin to 900mg  three times a day; if not working, then consider pregabalin up to 150mg  three times a day; also consider pain mgmt clinic referral if continues with intractable pain - add duloxetine 60mg  daily - consider OT evaluation  Meds ordered this encounter  Medications   DULoxetine (CYMBALTA) 60 MG capsule    Sig: Take 1 capsule (60 mg total) by mouth daily.    Dispense:  30 capsule    Refill:  3   gabapentin (NEURONTIN) 300 MG capsule    Sig: Take 3 capsules (900 mg total) by mouth 3 (three) times daily.    Dispense:  270 capsule    Refill:  6   Return for pending if symptoms worsen or fail to improve.    Suanne Marker, MD 11/15/2022, 2:42 PM Certified in Neurology, Neurophysiology and Neuroimaging  [facility] [address] [place], [place] [address] [phone]

## 2022-11-28 ENCOUNTER — Other Ambulatory Visit: Payer: Self-pay

## 2022-12-03 ENCOUNTER — Ambulatory Visit (HOSPITAL_COMMUNITY)
Admission: EM | Admit: 2022-12-03 | Discharge: 2022-12-03 | Disposition: A | Discharge status: Home / Self Care | Payer: Self-pay | Attending: Emergency Medicine | Admitting: Emergency Medicine

## 2022-12-03 ENCOUNTER — Encounter (HOSPITAL_COMMUNITY): Payer: Self-pay

## 2022-12-03 DIAGNOSIS — B0229 Other postherpetic nervous system involvement: Secondary | ICD-10-CM

## 2022-12-03 DIAGNOSIS — Z760 Encounter for issue of repeat prescription: Secondary | ICD-10-CM

## 2022-12-03 MED ORDER — DULOXETINE HCL 60 MG PO CPEP
60.0000 mg | ORAL_CAPSULE | Freq: Every day | ORAL | 0 refills | Status: DC
Start: 2022-12-03 — End: 2023-02-07

## 2022-12-03 NOTE — ED Provider Notes (Signed)
[facility]    CSN: [id number] Arrival date & time: 12/03/22  1044      History   Chief Complaint Chief Complaint  Patient presents with   Rash    HPI Carl Clark is a 72 y.o. male.   Patient presents to clinic requesting refills of gabapentin and Cymbalta.  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 When taking gabapentin and cymbalta pain has been manageable. Reports he does not have good control over his left hand due to his postherpetic neuralgia, and he dropped his Cymbalta prescription in the toilet.   He has been out of these medications for the past few days and has been having trouble sleeping due to pain.   Was seen by [facility] on 8/27 and started on Cymbalta, Gabapentin was increased to 900mg  3x daily.   Language barrier. Patient speaks Arabic and [place], declined language interpreter for this visit.    The history is provided by the patient and medical records.  Rash   Past Medical History:  Diagnosis Date   CAD (coronary artery disease) 03/16/2017   S/p NSTEMI 6/18 >> LHC: multivessel CAD >> s/p CABG // Echo 6/18: Mild LVH, EF 60-65, normal wall motion, grade 1 diastolic dysfunction, normal RV SF, PASP 36 (borderline pulmonary hypertension) // Pre-CABG Dopplers 09/13/16: Bilateral ICA 1-39   Echocardiogram    Echo 09/2018: EF 60-65, normal wall motion, RVSP 24.9   Family history of anesthesia complication    "never had anesthesia" (07/14/2012)   History of non-ST elevation myocardial infarction (NSTEMI) 09/12/2016   Treated with CABG   History of rectal bleeding    secondary to diverticulum affecting right hepatic flexure - 06/2012   Hypertension    Mild carotid artery disease (HCC)    a. 1-39% bilaterally preCABG in 2018.   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   History of non-ST elevation myocardial infarction (NSTEMI) 09/12/2016   Rectal bleeding 07/13/2012   Diabetes mellitus (HCC) 07/13/2012   Essential hypertension 07/13/2012    Past Surgical History:  Procedure Laterality Date   COLONOSCOPY Left 07/14/2012   Procedure: COLONOSCOPY;  Surgeon: Willis Modena, MD;  Location: [facility] ENDOSCOPY;  Service: Endoscopy;  Laterality: Left;   CORONARY ARTERY BYPASS GRAFT N/A 09/15/2016   Procedure: CORONARY ARTERY BYPASS GRAFTING (CABG) x 3, LIMA to LAD, SVG to DIAGONAL, SVG to OM2, USING LEFT MAMMARY ARTERY AND RIGHT GREATER SAPHENOUS VEIN HARVESTED ENDOSCOPICALLY;  Surgeon: Delight Ovens, MD;  Location: [facility] OR;  Service: Open Heart Surgery;  Laterality: N/A;   ESOPHAGOGASTRODUODENOSCOPY N/A 07/13/2012   Procedure: ESOPHAGOGASTRODUODENOSCOPY (EGD);  Surgeon: Willis Modena, MD;  Location: [facility] ENDOSCOPY;  Service: Endoscopy;  Laterality: N/A;  pt in ED A8   LEFT HEART CATH AND CORONARY ANGIOGRAPHY N/A 09/12/2016   Procedure: Left Heart Cath and Coronary Angiography;  Surgeon: Tonny Bollman, MD;  Location: [facility] INVASIVE CV  LAB;  Service: Cardiovascular;  Laterality: N/A;   NO PAST SURGERIES     TEE WITHOUT CARDIOVERSION N/A 09/15/2016   Procedure: TRANSESOPHAGEAL ECHOCARDIOGRAM (TEE);  Surgeon: Delight Ovens, MD;  Location: [facility] OR;  Service: Open Heart Surgery;  Laterality: N/A;       Home Medications    Prior to Admission medications   Medication Sig Start Date End Date Taking? Authorizing Provider   DULoxetine (CYMBALTA) 60 MG capsule Take 1 capsule (60 mg total) by mouth daily. 12/03/22 01/02/23 Yes Rinaldo Ratel, [place] N, FNP  Accu-Chek Softclix Lancets lancets Use as instructed. Inject into the skin twice daily 09/13/22   Claiborne Rigg, NP  acetaminophen (TYLENOL) 500 MG tablet Take 2 tablets (1,000 mg total) by mouth every 6 (six) hours as needed for moderate pain. 10/27/22   Fayrene Helper, PA-C  amLODipine (NORVASC) 10 MG tablet Take 1 tablet (10 mg total) by mouth daily. FOR BLOOD PRESSURE 02/21/22   Claiborne Rigg, NP  aspirin EC 81 MG tablet Take 1 tablet (81 mg total) by mouth daily. Swallow whole. 11/17/21   Tonny Bollman, MD  Blood Glucose Monitoring Suppl (ACCU-CHEK GUIDE ME) w/Device KIT Use to check blood sugar 2 times daily. E11.9 02/21/22   Claiborne Rigg, NP  Blood Pressure Monitor DEVI Please provide patient with insurance approved blood pressure monitor 08/06/21   Claiborne Rigg, NP  capsaicin (ZOSTRIX) 0.025 % cream Apply topically 4 (four) times daily as needed. 11/11/22   Vallery Sa, Amy L, PA  carvedilol (COREG) 6.25 MG tablet Take 1 tablet (6.25 mg total) by mouth 2 (two) times daily with a meal. FOR BLOOD PRESSURE. STOP METOPROLOL!! 05/23/22   Claiborne Rigg, NP  diclofenac (VOLTAREN) 75 MG EC tablet Take 1 tablet (75 mg total) by mouth 2 (two) times daily. 11/19/21   Claiborne Rigg, NP  Dulaglutide (TRULICITY) 1.5 MG/0.5ML SOPN Inject 1.5 mg into the skin once a week. FOR DIABETES 10/11/22   Hoy Register, MD  DULoxetine (CYMBALTA) 60 MG capsule Take 1 capsule (60 mg total) by mouth daily. 11/15/22   Penumalli, Glenford Bayley, MD  fluticasone (FLONASE) 50 MCG/ACT nasal spray Place 2 sprays into both nostrils daily. 11/09/22   Hoy Register, MD  gabapentin (NEURONTIN) 300 MG capsule Take 3 capsules (900 mg total) by mouth 3 (three) times daily. 11/15/22   Penumalli, Glenford Bayley, MD  glucose blood (ACCU-CHEK GUIDE) test strip Use as instructed. Check blood glucose by fingerstick twice  per day. 09/13/22   Claiborne Rigg, NP  HYDROcodone-acetaminophen (NORCO/VICODIN) 5-325 MG tablet Take 1 tablet by mouth at bedtime as needed. 10/31/22   Carlisle Beers, FNP  hydrocortisone cream 0.5 % Apply 1 application topically daily. Apply to lips 11/30/20   Claiborne Rigg, NP  Insulin Glargine [facility]) 100 UNIT/ML Inject 28 Units into the skin 2 (two) times daily. 11/14/22   Hoy Register, MD  metFORMIN (GLUCOPHAGE) 1000 MG tablet Take 1 tablet (1,000 mg total) by mouth 2 (two) times daily with a meal. For DIABETES. 02/21/22   Claiborne Rigg, NP  Misc. Devices MISC Please provide patient with an insurance approved QUAD CANE 08/10/17   Claiborne Rigg, NP  mupirocin ointment (BACTROBAN) 2 % Apply 1 Application topically 2 (two) times daily. 11/09/22   Hoy Register, MD  nitroGLYCERIN (NITROSTAT) 0.4 MG SL tablet Dissolve 1 tablet under the tongue every 5 minutes as needed for chest pain. Max of 3 doses, then 911. 11/17/21  Tonny Bollman, MD  rosuvastatin (CRESTOR) 20 MG tablet Take 1 tablet (20 mg total) by mouth daily. STOP atorvastatin. 10/11/22   Hoy Register, MD  senna-docusate (SENOKOT-S) 8.6-50 MG tablet Take 2 tablets by mouth daily. FOR CONSTIPATION 02/21/22   Claiborne Rigg, NP  traZODone (DESYREL) 50 MG tablet Take 1-2 tablets (50-100 mg total) by mouth at bedtime. For SLEEP 05/23/22   Claiborne Rigg, NP  triamcinolone (KENALOG) 0.025 % ointment Apply 1 Application topically 2 (two) times daily onto the back of the head 02/21/22   Claiborne Rigg, NP  valACYclovir (VALTREX) 1000 MG tablet Take 1 tablet (1,000 mg total) by mouth 3 (three) times daily. 10/27/22   Fayrene Helper, PA-C  valsartan (DIOVAN) 160 MG tablet Take 1 tablet (160 mg total) by mouth daily. For blood pressure 11/17/21   Tonny Bollman, MD    Family History Family History  Problem Relation Age of Onset   Diabetes Mother    Diabetes Father     Social History Social History   Tobacco Use    Smoking status: Never   Smokeless tobacco: Never  Vaping Use   Vaping status: Never Used  Substance Use Topics   Alcohol use: No   Drug use: No     Allergies   Victoza [liraglutide]   Review of Systems Review of Systems  Skin:  Positive for rash.  Neurological:  Positive for weakness and numbness.     Physical Exam Triage Vital Signs ED Triage Vitals  Encounter Vitals Group     BP 12/03/22 1127 (!) 153/75     Systolic BP Percentile --      Diastolic BP Percentile --      Pulse Rate 12/03/22 1127 91     Resp 12/03/22 1127 16     Temp 12/03/22 1127 98.2 F (36.8 C)     Temp Source 12/03/22 1127 Oral     SpO2 12/03/22 1127 95 %     Weight 12/03/22 1127 142 lb (64.4 kg)     Height 12/03/22 1127 5\' 5"  (1.651 m)     Head Circumference --      Peak Flow --      Pain Score 12/03/22 1126 10     Pain Loc --      Pain Education --      Exclude from Growth Chart --    No data found.  Updated Vital Signs BP (!) 153/75 (BP Location: Right Arm)   Pulse 91   Temp 98.2 F (36.8 C) (Oral)   Resp 16   Ht 5\' 5"  (1.651 m)   Wt 142 lb (64.4 kg)   SpO2 95%   BMI 23.63 kg/m   Visual Acuity Right Eye Distance:   Left Eye Distance:   Bilateral Distance:    Right Eye Near:   Left Eye Near:    Bilateral Near:     Physical Exam Vitals and nursing note reviewed.  Constitutional:      Appearance: Normal appearance.  HENT:     Head: Normocephalic and atraumatic.     Right Ear: External ear normal.     Left Ear: External ear normal.     Nose: Nose normal.     Mouth/Throat:     Mouth: Mucous membranes are moist.  Eyes:     Conjunctiva/sclera: Conjunctivae normal.  Cardiovascular:     Rate and Rhythm: Normal rate.     Pulses: Normal pulses.  Pulmonary:     Effort: Pulmonary  effort is normal.  Musculoskeletal:        General: Tenderness present. Normal range of motion.     Left hand: Decreased strength. There is no disruption of two-point discrimination. Normal  capillary refill. Normal pulse.     Comments: Overall diminished grip strength of left hand. Scarring from herpetic rash present across upper and lower arm. Radial pulse 2+ with brisk capillary refill.   Skin:    General: Skin is warm and dry.     Findings: Rash present.  Neurological:     General: No focal deficit present.     Mental Status: He is alert and oriented to person, place, and time.  Psychiatric:        Mood and Affect: Mood normal.        Behavior: Behavior normal.      UC Treatments / Results  Labs (all labs ordered are listed, but only abnormal results are displayed) Labs Reviewed - No data to display  EKG   Radiology No results found.  Procedures Procedures (including critical care time)  Medications Ordered in UC Medications - No data to display  Initial Impression / Assessment and Plan / UC Course  I have reviewed the triage vital signs and the nursing notes.  Pertinent labs & imaging results that were available during my care of the patient were reviewed by me and considered in my medical decision making (see chart for details).  Vitals and triage reviewed, patient is hemodynamically stable.  Would like a refill of his Cymbalta and gabapentin due to postherpetic neuralgia after dropping Cymbalta in the toilet.  Prescriptions are showing multiple refills, will send Cymbalta prescription to preferred pharmacy.  Advised to give refill of gabapentin.  Patient does have diminished grip strength on left side, hyperpigmentation over herpetic scars.  Reports good pain control with gabapentin and Cymbalta.  No new lesions or rashes.  Plan of care, follow-up care return precautions given, no questions at this time.     Final Clinical Impressions(s) / UC Diagnoses   Final diagnoses:  Post herpetic neuralgia  Medication refill     Discharge Instructions      You have a refill of gabapentin at your pharmacy.  I have sent in a refill of your Cymbalta.  You  should have multiple refills left of this medication as well at the [facility].  If the Cymbalta and gabapentin combination do not work well to control your pain, you can return to this clinic, [facility], or your primary care to receive a pain management referral.  Return to clinic for any new or urgent symptoms.      ED Prescriptions     Medication Sig Dispense Auth. Provider   DULoxetine (CYMBALTA) 60 MG capsule Take 1 capsule (60 mg total) by mouth daily. 30 capsule Tychelle Purkey, [place] N, [place]      PDMP not reviewed this encounter.   Kiyoko Mcguirt, [place] N, [place] 12/03/22 1157

## 2022-12-03 NOTE — Discharge Instructions (Addendum)
You have a refill of gabapentin at your pharmacy.  I have sent in a refill of your Cymbalta.  You should have multiple refills left of this medication as well at the [facility].  If the Cymbalta and gabapentin combination do not work well to control your pain, you can return to this clinic, [facility], or your primary care to receive a pain management referral.  Return to clinic for any new or urgent symptoms.

## 2022-12-03 NOTE — ED Triage Notes (Signed)
Patient here today with c/o painful rash on left arm that has been going on for over 2 months now. He was here last month for the same thing but patient states that he has now been able to sleep over the last 3 days due to the pain. He has also had loss of appetite. He is having weakness in his left arm. He finished the Gabapentin. Patient states that he dropped the Duloxetine in the toilet because of the arm weakness.

## 2022-12-04 NOTE — Progress Notes (Unsigned)
Cardiology Office Note:  .   Date:  12/05/2022  ID:  Carl Clark, DOB 1950-09-01, MRN [id number] PCP: Claiborne Rigg, NP  [facility] Providers Cardiologist:  Tonny Bollman, MD    Patient Profile: .      PMH CAD NSTEMI 2018 with multivessel CAD treated with CABG x 3 LIMA to LAD, SVG to diagonal, and SVG to obtuse marginal Hyperlipidemia Type 2 DM Hypertension Vasovagal syncope Carotid artery disease Pre-CABG dopplers 09/13/2016 Bilateral ICA 1-39  He initially presented in 2018 with NSTEMI and was found to have multivessel CAD ultimately treated with CABG. EF was normal. He has maintained consistent follow-up.  Most recent echocardiogram 09/26/2018 revealed normal LVEF 60 to 65%, normal diastolic function, no RWMA, normal RV, no significant valve disease.  He has been advised to take lifetime aspirin.  Last cardiology clinic visit was 11/17/2021 with Dr. Excell Seltzer.  At that time he did not have any concerning cardiac symptoms.  He remained active at work walking during the workday with no functional limitation.  He reported only being able to sleep for about 3 hours at the time.  He plans to discuss this later in the week with his PCP.  Lipids were well-controlled at that time.       History of Present Illness: .   Carl Clark is a very pleasant 72 y.o. male who is here today for annual follow-up. He is seen today with the assistance of [employer].  He reports he is having trouble with his left upper extremity with numbness and inability to hold onto objects.This is felt to be 2/2 herpetic neuralgia and is being followed by neurology.  He has not been able to work consistently due to this problem.  Occasionally has dizziness when getting out of bed or in the shower. Has been careful to move more carefully, no falls. He denies chest pain, shortness of breath, lower extremity edema, fatigue, palpitations, presyncope, syncope, orthopnea, and PND. No  bleeding concerns.   ROS: See HPI       Studies Reviewed: .        Risk Assessment/Calculations:             Physical Exam:   VS:  BP 136/64   Pulse 71   Ht 5\' 5"  (1.651 m)   Wt 142 lb (64.4 kg)   SpO2 95%   BMI 23.63 kg/m    Wt Readings from Last 3 Encounters:  12/05/22 142 lb (64.4 kg)  12/03/22 142 lb (64.4 kg)  11/15/22 148 lb (67.1 kg)    GEN: Well nourished, well developed in no acute distress NECK: No JVD; No carotid bruits CARDIAC: RRR, no murmurs, rubs, gallops RESPIRATORY:  Clear to auscultation without rales, wheezing or rhonchi  ABDOMEN: Soft, non-tender, non-distended EXTREMITIES:  No edema; No deformity     ASSESSMENT AND PLAN: .    CAD without angina: S/p CABG x 3 2018. He denies chest pain, dyspnea, or other symptoms concerning for angina.  No indication for further ischemic evaluation at this time. Encouraged secondary prevention including good management of blood sugar, regular exercise to achieve 150 minutes of moderate intensity exercise each week, along with heart healthy diet limiting saturated fat, sugar, simple carbohydrates, and processed foods. No bleeding concerns.  Continue aspirin, carvedilol, rosuvastatin, amlodipine, valsartan.   Hypertension: BP is slightly elevated. He has not taken medication yet this morning. From recent record review, it appears BP is labile. Renal function stable on labs completed  10/27/22. Encouraged close follow-up with PCP. No medication changes today.   Hyperlipidemia: LDL-C 61 on 08/06/21. We will recheck today. Continue rosuvastatin.   Type 2 DM: A1C 13.5 on 09/13/22. Glucose 318 on 10/27/22. Lengthy discussion about how uncontrolled glucose contributes to worsening neuropathy and increases risk for ASCVD. Encouraged continued efforts to improve glucose and close follow-up with PCP.        Dispo: 1 year with Dr. Excell Seltzer  Signed, Eligha Bridegroom, NP-C

## 2022-12-05 ENCOUNTER — Ambulatory Visit: Payer: Self-pay | Attending: Nurse Practitioner | Admitting: Nurse Practitioner

## 2022-12-05 ENCOUNTER — Encounter: Payer: Self-pay | Admitting: Nurse Practitioner

## 2022-12-05 VITALS — BP 136/64 | HR 71 | Ht 65.0 in | Wt 142.0 lb

## 2022-12-05 DIAGNOSIS — I2510 Atherosclerotic heart disease of native coronary artery without angina pectoris: Secondary | ICD-10-CM

## 2022-12-05 DIAGNOSIS — I10 Essential (primary) hypertension: Secondary | ICD-10-CM

## 2022-12-05 DIAGNOSIS — E785 Hyperlipidemia, unspecified: Secondary | ICD-10-CM

## 2022-12-05 DIAGNOSIS — E1165 Type 2 diabetes mellitus with hyperglycemia: Secondary | ICD-10-CM

## 2022-12-05 DIAGNOSIS — Z951 Presence of aortocoronary bypass graft: Secondary | ICD-10-CM

## 2022-12-05 NOTE — Patient Instructions (Signed)
Medication Instructions:   Your physician recommends that you continue on your current medications as directed. Please refer to the Current Medication list given to you today.   *If you need a refill on your cardiac medications before your next appointment, please call your pharmacy*   Lab Work:  TODAY!!!!! LIPID/LFT  If you have labs (blood work) drawn today and your tests are completely normal, you will receive your results only by: MyChart Message (if you have MyChart) OR A paper copy in the mail If you have any lab test that is abnormal or we need to change your treatment, we will call you to review the results.   Testing/Procedures:  None ordered.   Follow-Up: At [facility], you and your health needs are our priority.  As part of our continuing mission to provide you with exceptional heart care, we have created designated Provider Care Teams.  These Care Teams include your primary Cardiologist (physician) and Advanced Practice Providers (APPs -  Physician Assistants and Nurse Practitioners) who all work together to provide you with the care you need, when you need it.  We recommend signing up for the patient portal called "MyChart".  Sign up information is provided on this After Visit Summary.  MyChart is used to connect with patients for Virtual Visits (Telemedicine).  Patients are able to view lab/test results, encounter notes, upcoming appointments, etc.  Non-urgent messages can be sent to your provider as well.   To learn more about what you can do with MyChart, go to [email or web address].    Your next appointment:   1 year(s)  Provider:   Tonny Bollman, MD     Other Instructions  Your physician wants you to follow-up in: 1 year with Dr. Excell Seltzer. You will receive a reminder letter in the mail two months in advance. If you don't receive a letter, please call our office to schedule the follow-up appointment.

## 2022-12-06 LAB — HEPATIC FUNCTION PANEL
ALT: 19 IU/L (ref 0–44)
AST: 19 IU/L (ref 0–40)
Albumin: 4.3 g/dL (ref 3.8–4.8)
Alkaline Phosphatase: 91 IU/L (ref 44–121)
Bilirubin Total: 0.6 mg/dL (ref 0.0–1.2)
Bilirubin, Direct: 0.19 mg/dL (ref 0.00–0.40)
Total Protein: 7.9 g/dL (ref 6.0–8.5)

## 2022-12-06 LAB — LIPID PANEL
Chol/HDL Ratio: 2.4 ratio (ref 0.0–5.0)
Cholesterol, Total: 72 mg/dL — ABNORMAL LOW (ref 100–199)
HDL: 30 mg/dL — ABNORMAL LOW (ref >39)
LDL Chol Calc (NIH): 26 mg/dL (ref 0–99)
Triglycerides: 75 mg/dL (ref 0–149)
VLDL Cholesterol Cal: 16 mg/dL (ref 5–40)

## 2022-12-08 ENCOUNTER — Ambulatory Visit: Payer: Self-pay | Admitting: Pharmacist

## 2022-12-13 ENCOUNTER — Ambulatory Visit: Payer: Self-pay | Attending: Family Medicine | Admitting: Pharmacist

## 2022-12-13 ENCOUNTER — Other Ambulatory Visit: Payer: Self-pay

## 2022-12-13 ENCOUNTER — Encounter: Payer: Self-pay | Admitting: Pharmacist

## 2022-12-13 VITALS — BP 109/62 | HR 62

## 2022-12-13 DIAGNOSIS — M25561 Pain in right knee: Secondary | ICD-10-CM

## 2022-12-13 DIAGNOSIS — E1165 Type 2 diabetes mellitus with hyperglycemia: Secondary | ICD-10-CM

## 2022-12-13 DIAGNOSIS — B0229 Other postherpetic nervous system involvement: Secondary | ICD-10-CM

## 2022-12-13 DIAGNOSIS — Z794 Long term (current) use of insulin: Secondary | ICD-10-CM

## 2022-12-13 DIAGNOSIS — Z7984 Long term (current) use of oral hypoglycemic drugs: Secondary | ICD-10-CM

## 2022-12-13 DIAGNOSIS — E785 Hyperlipidemia, unspecified: Secondary | ICD-10-CM

## 2022-12-13 DIAGNOSIS — Z7985 Long-term (current) use of injectable non-insulin antidiabetic drugs: Secondary | ICD-10-CM

## 2022-12-13 DIAGNOSIS — G8929 Other chronic pain: Secondary | ICD-10-CM

## 2022-12-13 LAB — POCT GLYCOSYLATED HEMOGLOBIN (HGB A1C): HbA1c, POC (controlled diabetic range): 10.9 % — AB (ref 0.0–7.0)

## 2022-12-13 MED ORDER — ROSUVASTATIN CALCIUM 20 MG PO TABS
20.0000 mg | ORAL_TABLET | Freq: Every day | ORAL | 3 refills | Status: DC
  Filled 2022-12-13 – 2023-02-07 (×2): qty 90, 90d supply, fill #0

## 2022-12-13 MED ORDER — ATORVASTATIN CALCIUM 80 MG PO TABS
80.0000 mg | ORAL_TABLET | Freq: Every day | ORAL | 1 refills | Status: DC
Start: 2022-12-13 — End: 2022-12-13

## 2022-12-13 MED ORDER — DICLOFENAC SODIUM 75 MG PO TBEC
75.0000 mg | DELAYED_RELEASE_TABLET | Freq: Two times a day (BID) | ORAL | 2 refills | Status: DC
Start: 2022-12-13 — End: 2022-12-13

## 2022-12-13 MED ORDER — DICLOFENAC SODIUM 75 MG PO TBEC
75.0000 mg | DELAYED_RELEASE_TABLET | Freq: Two times a day (BID) | ORAL | 2 refills | Status: DC
Start: 2022-12-13 — End: 2023-07-26
  Filled 2022-12-13: qty 60, 30d supply, fill #0

## 2022-12-13 MED ORDER — CAPSAICIN 0.025 % EX CREA
TOPICAL_CREAM | Freq: Four times a day (QID) | CUTANEOUS | 0 refills | Status: DC | PRN
Start: 2022-12-13 — End: 2023-07-26

## 2022-12-13 MED ORDER — BASAGLAR KWIKPEN 100 UNIT/ML ~~LOC~~ SOPN
50.0000 [IU] | PEN_INJECTOR | Freq: Every day | SUBCUTANEOUS | 2 refills | Status: DC
  Filled 2022-12-13: qty 15, 30d supply, fill #0

## 2022-12-13 NOTE — Progress Notes (Signed)
S:    PCP: [name]  No chief complaint on file.  Patient arrives in good spirits.  Presents for diabetes management at the request of [name]. Patient was referred on 09/13/2022. Pharmacy last saw him on 11/14/2022. It has been difficult to give priority to his diabetes d/t recent shingles infection and post-herpetic neuralgia. During his last two visits with [place], we have tried to educate him on his multi-modal analgesia.  Today pt reports to clinic with continued pain unchanged from last visit. He is taking gabapentin and duloxetine but not capsicin or hydrocodone that were prescribed earlier for him. States gabapentin has helped some.He is not checking his blood sugars at home. His accu chek that is preferred by insurance does not work for him and he insists that he must have One Touch verio supplies. He is not checking glucoses at home for this reason.  Family/Social History:  - FHx: DM (mother, father) - Tobacco: never smoker - Alcohol: reports occasional use   Insurance coverage/medication affordability: none  Patient reports adherence to medications. Current diabetes medications include:  - Semglee 28u BID  - Metformin 1000 mg BID  - Trulicity 1.5 mg weekly (Mondays or Tuesdays)  Patient endorses hypoglycemia that occurs in the morning. Cannot specify readings as he does check blood sugar.  Patient reported dietary habits:  - He does not limit carbohydrates  Patient-reported exercise habits:  - Does not exercise outside of work   Patient denies polyuria. Denies polydipsia.  Patient denies neuropathy. Patient denies visual changes. Patient reports self foot exams.   O:  Home glucometer: - Fasting: no meter today.  Lab Results  Component Value Date   HGBA1C 10.9 (A) 12/13/2022   Vitals:   12/13/22 1148  BP: 109/62  Pulse: 62    Lipid Panel     Component Value Date/Time   CHOL 72 (L) 12/05/2022 0939   TRIG 75 12/05/2022 0939   HDL 30 (L) 12/05/2022 0939    CHOLHDL 2.4 12/05/2022 0939   CHOLHDL 4.8 09/12/2016 0806   VLDL 29 09/12/2016 0806   LDLCALC 26 12/05/2022 0939   Clinical ASCVD: Yes - CAD, NSTEMI/CABG The ASCVD Risk score (Arnett DK, et al., 2019) failed to calculate for the following reasons:   The valid total cholesterol range is 130 to 320 mg/dL   A/P: Diabetes longstanding currently uncontrolled. A1c shows improvement today at 10.9 (down from 13.5 in June). He endorses symptoms of hypoglycemia with BID Basaglar. Will condense this to once daily and dose in the morning to help. He is adherent to Trulicity and metformin. We will continue these for now. PHN is understandably his biggest concern today. Encouraged him to pick-up capsacin OTC and use in addition to duloxetine and gabapentin. Additionally, I have sent a refill of diclofenac. Will message his PCP concerning hydrocodone refill. Finally, I have discussed with pharmacy his insurance situation. We cannot fill One Touch Verio for him today. Pharmacy is directing him upstairs for financial counseling to see what insurance options are available for him.  -Change Basaglar to 50u once daily in the morning. We likely need to back off of basal insulin and add mealtime insulin. However, I will try to simplify his regimen for now given his history of non-adherence.  -Continue Trulicity 1.5 mg weekly  -Continue metformin 1000 mg BID. -Extensively discussed pathophysiology of DM, recommended lifestyle interventions, dietary effects on glycemic control -Counseled on s/sx of and management of hypoglycemia -Next A1C anticipated 02/2023.  Written patient instructions  provided.  Total time in face to face counseling 30 minutes.   Follow up w/ PCP.   Butch Penny, PharmD, [place], CPP Clinical Pharmacist [facility] [phone]

## 2022-12-14 ENCOUNTER — Ambulatory Visit: Payer: Self-pay | Admitting: Nurse Practitioner

## 2022-12-14 ENCOUNTER — Other Ambulatory Visit: Payer: Self-pay

## 2022-12-15 ENCOUNTER — Other Ambulatory Visit: Payer: Self-pay | Admitting: Nurse Practitioner

## 2022-12-16 ENCOUNTER — Other Ambulatory Visit: Payer: Self-pay | Admitting: Nurse Practitioner

## 2022-12-16 DIAGNOSIS — B0229 Other postherpetic nervous system involvement: Secondary | ICD-10-CM

## 2023-02-07 ENCOUNTER — Other Ambulatory Visit: Payer: Self-pay | Admitting: Cardiovascular Disease

## 2023-02-07 ENCOUNTER — Other Ambulatory Visit: Payer: Self-pay | Admitting: Nurse Practitioner

## 2023-02-07 ENCOUNTER — Other Ambulatory Visit: Payer: Self-pay

## 2023-02-07 ENCOUNTER — Ambulatory Visit: Payer: Medicare Other | Attending: Nurse Practitioner | Admitting: Nurse Practitioner

## 2023-02-07 ENCOUNTER — Encounter: Payer: Self-pay | Admitting: Nurse Practitioner

## 2023-02-07 VITALS — BP 124/64 | HR 81 | Ht 65.0 in | Wt 128.4 lb

## 2023-02-07 DIAGNOSIS — I252 Old myocardial infarction: Secondary | ICD-10-CM | POA: Insufficient documentation

## 2023-02-07 DIAGNOSIS — T43216A Underdosing of selective serotonin and norepinephrine reuptake inhibitors, initial encounter: Secondary | ICD-10-CM | POA: Diagnosis not present

## 2023-02-07 DIAGNOSIS — I10 Essential (primary) hypertension: Secondary | ICD-10-CM

## 2023-02-07 DIAGNOSIS — E1165 Type 2 diabetes mellitus with hyperglycemia: Secondary | ICD-10-CM | POA: Diagnosis not present

## 2023-02-07 DIAGNOSIS — B0229 Other postherpetic nervous system involvement: Secondary | ICD-10-CM | POA: Diagnosis present

## 2023-02-07 DIAGNOSIS — Z91120 Patient's intentional underdosing of medication regimen due to financial hardship: Secondary | ICD-10-CM | POA: Insufficient documentation

## 2023-02-07 DIAGNOSIS — I2510 Atherosclerotic heart disease of native coronary artery without angina pectoris: Secondary | ICD-10-CM | POA: Insufficient documentation

## 2023-02-07 DIAGNOSIS — D649 Anemia, unspecified: Secondary | ICD-10-CM

## 2023-02-07 DIAGNOSIS — Z951 Presence of aortocoronary bypass graft: Secondary | ICD-10-CM | POA: Insufficient documentation

## 2023-02-07 DIAGNOSIS — I6523 Occlusion and stenosis of bilateral carotid arteries: Secondary | ICD-10-CM | POA: Diagnosis not present

## 2023-02-07 DIAGNOSIS — I25119 Atherosclerotic heart disease of native coronary artery with unspecified angina pectoris: Secondary | ICD-10-CM

## 2023-02-07 MED ORDER — CARVEDILOL 6.25 MG PO TABS
6.2500 mg | ORAL_TABLET | Freq: Two times a day (BID) | ORAL | 1 refills | Status: DC
Start: 2023-02-07 — End: 2023-07-26
  Filled 2023-02-07 – 2023-02-23 (×2): qty 180, 90d supply, fill #0
  Filled 2023-06-06: qty 180, 90d supply, fill #1

## 2023-02-07 MED ORDER — AMLODIPINE BESYLATE 10 MG PO TABS
10.0000 mg | ORAL_TABLET | Freq: Every day | ORAL | 1 refills | Status: DC
Start: 2023-02-07 — End: 2023-04-25
  Filled 2023-02-07: qty 90, 90d supply, fill #0

## 2023-02-07 MED ORDER — GABAPENTIN 300 MG PO CAPS
900.0000 mg | ORAL_CAPSULE | Freq: Three times a day (TID) | ORAL | 2 refills | Status: DC
  Filled 2023-02-07: qty 270, 30d supply, fill #0
  Filled 2023-06-06: qty 270, 30d supply, fill #1
  Filled 2023-08-28: qty 270, 30d supply, fill #2

## 2023-02-07 MED ORDER — METFORMIN HCL 1000 MG PO TABS
1000.0000 mg | ORAL_TABLET | Freq: Two times a day (BID) | ORAL | 1 refills | Status: DC
Start: 2023-02-07 — End: 2023-11-13
  Filled 2023-02-07 – 2023-06-06 (×2): qty 180, 90d supply, fill #0
  Filled 2023-08-28: qty 180, 90d supply, fill #1

## 2023-02-07 MED ORDER — DULOXETINE HCL 60 MG PO CPEP
60.0000 mg | ORAL_CAPSULE | Freq: Every day | ORAL | 3 refills | Status: DC
Start: 2023-02-07 — End: 2023-08-28
  Filled 2023-02-07: qty 30, 30d supply, fill #0
  Filled 2023-06-06: qty 30, 30d supply, fill #1

## 2023-02-07 NOTE — Progress Notes (Signed)
Assessment & Plan:  [name] was seen today for medical management of chronic issues.  Diagnoses and all orders for this visit:  Essential hypertension Continue all antihypertensives as prescribed.  Reminded to bring in blood pressure log for follow  up appointment.  RECOMMENDATIONS: DASH/Mediterranean Diets are healthier choices for HTN.    Post herpetic neuralgia -     gabapentin (NEURONTIN) 300 MG capsule; Take 3 capsules (900 mg total) by mouth 3 (three) times daily. -     DULoxetine (CYMBALTA) 60 MG capsule; Take 1 capsule (60 mg total) by mouth daily.  Low hemoglobin -     CBC with Differential    Patient has been counseled on age-appropriate routine health concerns for screening and prevention. These are reviewed and up-to-date. Referrals have been placed accordingly. Immunizations are up-to-date or declined.    Subjective:   Chief Complaint  Patient presents with   Medical Management of Chronic Issues    Carl Clark 72 y.o. male presents to office today for follow-up to HTN  History of hypertension, poorly controlled yet diabetes, coronary artery disease, carotid artery disease, NSTEMI (08-2016)postherpetic neuralgia and weakness of left arm (09-2022) .  VRI was used to communicate directly with patient for the entire encounter including providing detailed patient instructions.    HTN Blood pressure is well controlled with amlodipine 10 mg daily, carvedilol 6.25 mg BID and valsartan 160mg  daily.  BP Readings from Last 3 Encounters:  02/07/23 124/64  12/13/22 109/62  12/05/22 136/64    Postherpetic neuralgia Currently being followed by neurology and recently prescribed gabapentin 900 mg 3 times a day.  He is also taking Cymbalta 60 mg daily He states he has been out of gabapentin and cymbalta for over a month ago. He was not aware his medications were available at [facility] .   Review of Systems  Constitutional:  Negative for  fever, malaise/fatigue and weight loss.  HENT: Negative.  Negative for nosebleeds.   Eyes: Negative.  Negative for blurred vision, double vision and photophobia.  Respiratory: Negative.  Negative for cough and shortness of breath.   Cardiovascular: Negative.  Negative for chest pain, palpitations and leg swelling.  Gastrointestinal: Negative.  Negative for heartburn, nausea and vomiting.  Musculoskeletal: Negative.  Negative for myalgias.  Neurological:  Positive for sensory change. Negative for dizziness, focal weakness, seizures and headaches.       SEE HPI  Psychiatric/Behavioral: Negative.  Negative for suicidal ideas.     Past Medical History:  Diagnosis Date   CAD (coronary artery disease) 03/16/2017   S/p NSTEMI 6/18 >> LHC: multivessel CAD >> s/p CABG // Echo 6/18: Mild LVH, EF 60-65, normal wall motion, grade 1 diastolic dysfunction, normal RV SF, PASP 36 (borderline pulmonary hypertension) // Pre-CABG Dopplers 09/13/16: Bilateral ICA 1-39   Echocardiogram    Echo 09/2018: EF 60-65, normal wall motion, RVSP 24.9   Family history of anesthesia complication    "never had anesthesia" (07/14/2012)   History of non-ST elevation myocardial infarction (NSTEMI) 09/12/2016   Treated with CABG   History of rectal bleeding    secondary to diverticulum affecting right hepatic flexure - 06/2012   Hypertension    Mild carotid artery disease (HCC)    a. 1-39% bilaterally preCABG in 2018.   Type II diabetes mellitus [facility])     Past Surgical History:  Procedure Laterality Date   COLONOSCOPY Left 07/14/2012   Procedure: COLONOSCOPY;  Surgeon: Willis Modena, MD;  Location: [facility] ENDOSCOPY;  Service: Endoscopy;  Laterality: Left;   CORONARY ARTERY BYPASS GRAFT N/A 09/15/2016   Procedure: CORONARY ARTERY BYPASS GRAFTING (CABG) x 3, LIMA to LAD, SVG to DIAGONAL, SVG to OM2, USING LEFT MAMMARY ARTERY AND RIGHT GREATER SAPHENOUS VEIN HARVESTED ENDOSCOPICALLY;  Surgeon: Delight Ovens, MD;  Location: [facility]  OR;  Service: Open Heart Surgery;  Laterality: N/A;   ESOPHAGOGASTRODUODENOSCOPY N/A 07/13/2012   Procedure: ESOPHAGOGASTRODUODENOSCOPY (EGD);  Surgeon: Willis Modena, MD;  Location: [facility] ENDOSCOPY;  Service: Endoscopy;  Laterality: N/A;  pt in ED A8   LEFT HEART CATH AND CORONARY ANGIOGRAPHY N/A 09/12/2016   Procedure: Left Heart Cath and Coronary Angiography;  Surgeon: Tonny Bollman, MD;  Location: [facility] INVASIVE CV LAB;  Service: Cardiovascular;  Laterality: N/A;   NO PAST SURGERIES     TEE WITHOUT CARDIOVERSION N/A 09/15/2016   Procedure: TRANSESOPHAGEAL ECHOCARDIOGRAM (TEE);  Surgeon: Delight Ovens, MD;  Location: [facility] OR;  Service: Open Heart Surgery;  Laterality: N/A;    Family History  Problem Relation Age of Onset   Diabetes Mother    Diabetes Father     Social History Reviewed with no changes to be made today.   Outpatient Medications Prior to Visit  Medication Sig Dispense Refill   Accu-Chek Softclix Lancets lancets Use as instructed. Inject into the skin twice daily 100 each 3   acetaminophen (TYLENOL) 500 MG tablet Take 2 tablets (1,000 mg total) by mouth every 6 (six) hours as needed for moderate pain. 100 tablet 0   amLODipine (NORVASC) 10 MG tablet Take 1 tablet (10 mg total) by mouth daily. FOR BLOOD PRESSURE 90 tablet 1   aspirin EC 81 MG tablet Take 1 tablet (81 mg total) by mouth daily. Swallow whole. 90 tablet 3   Blood Glucose Monitoring Suppl (ACCU-CHEK GUIDE ME) w/Device KIT Use to check blood sugar 2 times daily. E11.9 1 kit 0   Blood Pressure Monitor DEVI Please provide patient with insurance approved blood pressure monitor 1 each 0   capsaicin (ZOSTRIX) 0.025 % cream Apply topically 4 (four) times daily as needed. 60 g 0   carvedilol (COREG) 6.25 MG tablet Take 1 tablet (6.25 mg total) by mouth 2 (two) times daily with a meal. FOR BLOOD PRESSURE. STOP METOPROLOL!! 180 tablet 1   diclofenac (VOLTAREN) 75 MG EC tablet Take 1 tablet (75 mg total) by mouth 2 (two)  times daily. 60 tablet 2   Dulaglutide (TRULICITY) 1.5 MG/0.5ML SOPN Inject 1.5 mg into the skin once a week. FOR DIABETES 6 mL 1   fluticasone (FLONASE) 50 MCG/ACT nasal spray Place 2 sprays into both nostrils daily. 16 g 6   glucose blood (ACCU-CHEK GUIDE) test strip Use as instructed. Check blood glucose by fingerstick twice per day. 100 each 12   hydrocortisone cream 0.5 % Apply 1 application topically daily. Apply to lips 30 g 1   Insulin Glargine (BASAGLAR KWIKPEN) 100 UNIT/ML Inject 50 Units into the skin daily. Inject in the morning. 15 mL 2   metFORMIN (GLUCOPHAGE) 1000 MG tablet Take 1 tablet (1,000 mg total) by mouth 2 (two) times daily with a meal. For DIABETES. 180 tablet 1   Misc. Devices MISC Please provide patient with an insurance approved QUAD CANE 1 each 0   mupirocin ointment (BACTROBAN) 2 % Apply 1 Application topically 2 (two) times daily. 22 g 0   nitroGLYCERIN (NITROSTAT) 0.4 MG SL tablet Dissolve 1 tablet under the tongue every 5 minutes as needed for chest pain. Max of 3  doses, then 911. 25 tablet 6   traZODone (DESYREL) 50 MG tablet Take 1-2 tablets (50-100 mg total) by mouth at bedtime. For SLEEP 180 tablet 1   triamcinolone (KENALOG) 0.025 % ointment Apply 1 Application topically 2 (two) times daily onto the back of the head 454 g 1   valACYclovir (VALTREX) 1000 MG tablet Take 1 tablet (1,000 mg total) by mouth 3 (three) times daily. 21 tablet 0   valsartan (DIOVAN) 160 MG tablet Take 1 tablet (160 mg total) by mouth daily. For blood pressure 90 tablet 3   DULoxetine (CYMBALTA) 60 MG capsule Take 1 capsule (60 mg total) by mouth daily. 30 capsule 3   gabapentin (NEURONTIN) 300 MG capsule Take 3 capsules (900 mg total) by mouth 3 (three) times daily. 270 capsule 6   HYDROcodone-acetaminophen (NORCO/VICODIN) 5-325 MG tablet Take 1 tablet by mouth at bedtime as needed. (Patient not taking: Reported on 02/07/2023) 6 tablet 0   rosuvastatin (CRESTOR) 20 MG tablet Take 1  tablet (20 mg total) by mouth daily. STOP atorvastatin. (Patient not taking: Reported on 02/07/2023) 90 tablet 3   senna-docusate (SENOKOT-S) 8.6-50 MG tablet Take 2 tablets by mouth daily. FOR CONSTIPATION (Patient not taking: Reported on 02/07/2023) 90 tablet 3   DULoxetine (CYMBALTA) 60 MG capsule Take 1 capsule (60 mg total) by mouth daily. 30 capsule 0   Facility-Administered Medications Prior to Visit  Medication Dose Route Frequency Provider Last Rate Last Admin   sodium chloride 0.9 % bolus 1,000 mL  1,000 mL Intravenous Once Anders Simmonds, PA-C   Stopped at 11/07/22 1730    Allergies  Allergen Reactions   Victoza [Liraglutide] Swelling    Lip swelling, itching, rash       Objective:    BP 124/64 (BP Location: Left Arm, Patient Position: Sitting, Cuff Size: Normal)   Pulse 81   Ht 5\' 5"  (1.651 m)   Wt 128 lb 6.4 oz (58.2 kg)   SpO2 96%   BMI 21.37 kg/m  Wt Readings from Last 3 Encounters:  02/07/23 128 lb 6.4 oz (58.2 kg)  12/05/22 142 lb (64.4 kg)  12/03/22 142 lb (64.4 kg)    Physical Exam Vitals and nursing note reviewed.  Constitutional:      Appearance: He is well-developed.  HENT:     Head: Normocephalic and atraumatic.  Cardiovascular:     Rate and Rhythm: Normal rate and regular rhythm.     Heart sounds: Normal heart sounds. No murmur heard.    No friction rub. No gallop.  Pulmonary:     Effort: Pulmonary effort is normal. No tachypnea or respiratory distress.     Breath sounds: Normal breath sounds. No decreased breath sounds, wheezing, rhonchi or rales.  Chest:     Chest wall: No tenderness.  Abdominal:     General: Bowel sounds are normal.     Palpations: Abdomen is soft.  Musculoskeletal:     Right hand: Normal.     Left hand: Swelling and tenderness present. Decreased range of motion. Decreased strength.     Cervical back: Normal range of motion.  Skin:    General: Skin is warm and dry.  Neurological:     Mental Status: He is alert and  oriented to person, place, and time.     Coordination: Coordination normal.  Psychiatric:        Behavior: Behavior normal. Behavior is cooperative.        Thought Content: Thought content normal.  Judgment: Judgment normal.          Patient has been counseled extensively about nutrition and exercise as well as the importance of adherence with medications and regular follow-up. The patient was given clear instructions to go to ER or return to medical center if symptoms don't improve, worsen or new problems develop. The patient verbalized understanding.   Follow-up: Return in about 6 weeks (around 03/24/2023) for a1c.   Claiborne Rigg, FNP-BC [facility] and [facility] [place], [place] [phone]   02/07/2023, 2:00 PM

## 2023-02-08 ENCOUNTER — Other Ambulatory Visit: Payer: Self-pay

## 2023-02-08 MED ORDER — VALSARTAN 160 MG PO TABS
160.0000 mg | ORAL_TABLET | Freq: Every day | ORAL | 2 refills | Status: DC
  Filled 2023-02-08 – 2023-02-23 (×2): qty 90, 90d supply, fill #0
  Filled 2023-06-06: qty 90, 90d supply, fill #1
  Filled 2023-08-28: qty 90, 90d supply, fill #2

## 2023-02-09 ENCOUNTER — Other Ambulatory Visit: Payer: Self-pay

## 2023-02-09 ENCOUNTER — Ambulatory Visit: Payer: Medicare Other | Admitting: Neurology

## 2023-02-17 ENCOUNTER — Other Ambulatory Visit: Payer: Self-pay

## 2023-02-22 ENCOUNTER — Other Ambulatory Visit: Payer: Self-pay

## 2023-02-23 ENCOUNTER — Other Ambulatory Visit: Payer: Self-pay

## 2023-03-06 LAB — HM DIABETES EYE EXAM

## 2023-03-27 ENCOUNTER — Ambulatory Visit: Payer: No Typology Code available for payment source | Admitting: Nurse Practitioner

## 2023-03-29 ENCOUNTER — Other Ambulatory Visit: Payer: Self-pay | Admitting: Pharmacist

## 2023-03-29 ENCOUNTER — Other Ambulatory Visit: Payer: Self-pay

## 2023-03-29 ENCOUNTER — Telehealth: Payer: Self-pay | Admitting: Nurse Practitioner

## 2023-03-29 MED ORDER — FREESTYLE LIBRE 3 PLUS SENSOR MISC
6 refills | Status: DC
  Filled 2023-03-29: qty 2, 30d supply, fill #0

## 2023-03-29 MED ORDER — FREESTYLE LIBRE 3 READER DEVI
0 refills | Status: DC
  Filled 2023-03-29: qty 1, 365d supply, fill #0

## 2023-03-29 NOTE — Telephone Encounter (Signed)
 Charles w/ [facility] states paperwork for a prior authorization for patient's freestyle libre monitor will be faxed over to office. Requesting paperwork be filled out and faxed back, information will be included in paperwork.

## 2023-03-31 ENCOUNTER — Other Ambulatory Visit: Payer: Self-pay

## 2023-03-31 NOTE — Telephone Encounter (Signed)
 Charles w/[facility] calling to f/u on paperwork for PA on patient's [employer].   Please advise.

## 2023-04-01 ENCOUNTER — Other Ambulatory Visit: Payer: Self-pay | Admitting: Pharmacist

## 2023-04-01 MED ORDER — FREESTYLE LIBRE 3 READER DEVI: 0 refills | Status: DC

## 2023-04-01 MED ORDER — FREESTYLE LIBRE 3 PLUS SENSOR MISC: 6 refills | Status: DC

## 2023-04-03 ENCOUNTER — Other Ambulatory Visit: Payer: Self-pay

## 2023-04-04 ENCOUNTER — Other Ambulatory Visit: Payer: Self-pay

## 2023-04-12 ENCOUNTER — Other Ambulatory Visit: Payer: Self-pay

## 2023-04-24 ENCOUNTER — Encounter: Payer: Self-pay | Admitting: Nurse Practitioner

## 2023-04-24 ENCOUNTER — Ambulatory Visit: Payer: Medicare HMO | Attending: Nurse Practitioner | Admitting: Nurse Practitioner

## 2023-04-24 ENCOUNTER — Other Ambulatory Visit: Payer: Self-pay

## 2023-04-24 VITALS — BP 171/71 | HR 78 | Resp 19 | Ht 65.0 in | Wt 143.2 lb

## 2023-04-24 DIAGNOSIS — Z23 Encounter for immunization: Secondary | ICD-10-CM | POA: Diagnosis not present

## 2023-04-24 DIAGNOSIS — E7800 Pure hypercholesterolemia, unspecified: Secondary | ICD-10-CM

## 2023-04-24 DIAGNOSIS — N528 Other male erectile dysfunction: Secondary | ICD-10-CM

## 2023-04-24 DIAGNOSIS — E1165 Type 2 diabetes mellitus with hyperglycemia: Secondary | ICD-10-CM | POA: Diagnosis not present

## 2023-04-24 DIAGNOSIS — D729 Disorder of white blood cells, unspecified: Secondary | ICD-10-CM

## 2023-04-24 DIAGNOSIS — Z794 Long term (current) use of insulin: Secondary | ICD-10-CM | POA: Diagnosis not present

## 2023-04-24 DIAGNOSIS — E119 Type 2 diabetes mellitus without complications: Secondary | ICD-10-CM

## 2023-04-24 DIAGNOSIS — I10 Essential (primary) hypertension: Secondary | ICD-10-CM

## 2023-04-24 LAB — POCT GLYCOSYLATED HEMOGLOBIN (HGB A1C): Hemoglobin A1C: 9.8 % — AB (ref 4.0–5.6)

## 2023-04-24 MED ORDER — FREESTYLE LIBRE 3 PLUS SENSOR MISC
6 refills | Status: DC
Start: 2023-04-24 — End: 2023-07-26
  Filled 2023-04-24: qty 2, 30d supply, fill #0

## 2023-04-24 MED ORDER — FREESTYLE LIBRE 3 READER DEVI
0 refills | Status: DC
Start: 2023-04-24 — End: 2023-07-26
  Filled 2023-04-24: qty 1, 30d supply, fill #0

## 2023-04-24 MED ORDER — SHINGRIX 50 MCG/0.5ML IM SUSR
0.5000 mL | Freq: Once | INTRAMUSCULAR | 0 refills | Status: AC
  Filled 2023-04-24: qty 0.5, 1d supply, fill #0

## 2023-04-24 MED ORDER — BASAGLAR KWIKPEN 100 UNIT/ML ~~LOC~~ SOPN
50.0000 [IU] | PEN_INJECTOR | Freq: Every day | SUBCUTANEOUS | 2 refills | Status: DC
Start: 2023-04-24 — End: 2023-07-26
  Filled 2023-04-24: qty 15, 30d supply, fill #0
  Filled 2023-06-06: qty 15, 30d supply, fill #1
  Filled 2023-07-13: qty 15, 30d supply, fill #2

## 2023-04-24 MED ORDER — TRULICITY 3 MG/0.5ML ~~LOC~~ SOAJ
3.0000 mg | SUBCUTANEOUS | 6 refills | Status: DC
Start: 2023-04-24 — End: 2023-11-28
  Filled 2023-04-24: qty 2, 28d supply, fill #0
  Filled 2023-06-06: qty 2, 28d supply, fill #1
  Filled 2023-07-13: qty 2, 28d supply, fill #2
  Filled 2023-08-28: qty 2, 28d supply, fill #3
  Filled 2023-11-13: qty 2, 28d supply, fill #4

## 2023-04-24 NOTE — Patient Instructions (Signed)
[facility] Address: [address] [place],  [place], [place] [address] Phone: [phone]

## 2023-04-24 NOTE — Progress Notes (Unsigned)
Assessment & Plan:  Carl Clark was seen today for medical management of chronic issues.  Diagnoses and all orders for this visit:  Uncontrolled type 2 diabetes mellitus with hyperglycemia (HCC) -     POCT glycosylated hemoglobin (Hb A1C) -     Urine Albumin/Creatinine with ratio (send out) [LAB689] -     CMP14+EGFR  Type 2 diabetes mellitus with hyperglycemia, with long-term current use of insulin (HCC)  Other male erectile dysfunction -     Ambulatory referral to Urology  Need for shingles vaccine -     Zoster Vaccine Adjuvanted [facility]) injection; Inject 0.5 mLs into the muscle once for 1 dose.  Abnormal white blood cell (WBC) count -     CBC with Differential  Other orders -     Continuous Glucose Receiver (FREESTYLE LIBRE 3 READER) DEVI; Use to check blood sugar continuously throughout the day. -     Continuous Glucose Sensor (FREESTYLE LIBRE 3 PLUS SENSOR) MISC; Change sensor every 15 days. Use to check blood sugar continuously. -     Insulin Glargine (BASAGLAR KWIKPEN) 100 UNIT/ML; Inject 50 Units into the skin daily. Inject in the morning. -     Dulaglutide (TRULICITY) 3 MG/0.5ML SOAJ; Inject 3 mg as directed once a week.    Patient has been counseled on age-appropriate routine health concerns for screening and prevention. These are reviewed and up-to-date. Referrals have been placed accordingly. Immunizations are up-to-date or declined.    Subjective:   Chief Complaint  Patient presents with   Medical Management of Chronic Issues    Carl Clark 73 y.o. male presents to office today   BP Readings from Last 3 Encounters:  04/24/23 (!) 171/71  02/07/23 124/64  12/13/22 109/62     DM 2 Average 215 incrase truliciyt Lab Results  Component Value Date   HGBA1C 9.8 (A) 04/24/2023    Lab Results  Component Value Date   HGBA1C 10.9 (A) 12/13/2022    ROS  Past Medical History:  Diagnosis Date   CAD (coronary artery disease) 03/16/2017   S/p  NSTEMI 6/18 >> LHC: multivessel CAD >> s/p CABG // Echo 6/18: Mild LVH, EF 60-65, normal wall motion, grade 1 diastolic dysfunction, normal RV SF, PASP 36 (borderline pulmonary hypertension) // Pre-CABG Dopplers 09/13/16: Bilateral ICA 1-39   Echocardiogram    Echo 09/2018: EF 60-65, normal wall motion, RVSP 24.9   Family history of anesthesia complication    "never had anesthesia" (07/14/2012)   History of non-ST elevation myocardial infarction (NSTEMI) 09/12/2016   Treated with CABG   History of rectal bleeding    secondary to diverticulum affecting right hepatic flexure - 06/2012   Hypertension    Mild carotid artery disease (HCC)    a. 1-39% bilaterally preCABG in 2018.   Type II diabetes mellitus [facility])     Past Surgical History:  Procedure Laterality Date   COLONOSCOPY Left 07/14/2012   Procedure: COLONOSCOPY;  Surgeon: Willis Modena, MD;  Location: [facility] ENDOSCOPY;  Service: Endoscopy;  Laterality: Left;   CORONARY ARTERY BYPASS GRAFT N/A 09/15/2016   Procedure: CORONARY ARTERY BYPASS GRAFTING (CABG) x 3, LIMA to LAD, SVG to DIAGONAL, SVG to OM2, USING LEFT MAMMARY ARTERY AND RIGHT GREATER SAPHENOUS VEIN HARVESTED ENDOSCOPICALLY;  Surgeon: Delight Ovens, MD;  [facility] OR;  Service: Open Heart Surgery;  Laterality: N/A;   ESOPHAGOGASTRODUODENOSCOPY N/A 07/13/2012   Procedure: ESOPHAGOGASTRODUODENOSCOPY (EGD);  Surgeon: Willis Modena, MD;  Location: [facility] ENDOSCOPY;  Service: Endoscopy;  Laterality:  N/A;  pt in ED A8   LEFT HEART CATH AND CORONARY ANGIOGRAPHY N/A 09/12/2016   Procedure: Left Heart Cath and Coronary Angiography;  Surgeon: Tonny Bollman, MD;  Location: [facility] INVASIVE CV LAB;  Service: Cardiovascular;  Laterality: N/A;   NO PAST SURGERIES     TEE WITHOUT CARDIOVERSION N/A 09/15/2016   Procedure: TRANSESOPHAGEAL ECHOCARDIOGRAM (TEE);  Surgeon: Delight Ovens, MD;  Location: [facility] OR;  Service: Open Heart Surgery;  Laterality: N/A;    Family History  Problem Relation Age of  Onset   Diabetes Mother    Diabetes Father     Social History Reviewed with no changes to be made today.   Outpatient Medications Prior to Visit  Medication Sig Dispense Refill   Accu-Chek Softclix Lancets lancets Use as instructed. Inject into the skin twice daily 100 each 3   acetaminophen (TYLENOL) 500 MG tablet Take 2 tablets (1,000 mg total) by mouth every 6 (six) hours as needed for moderate pain. 100 tablet 0   amLODipine (NORVASC) 10 MG tablet Take 1 tablet (10 mg total) by mouth daily. FOR BLOOD PRESSURE 90 tablet 1   aspirin EC 81 MG tablet Take 1 tablet (81 mg total) by mouth daily. Swallow whole. 90 tablet 3   Blood Glucose Monitoring Suppl (ACCU-CHEK GUIDE ME) w/Device KIT Use to check blood sugar 2 times daily. E11.9 1 kit 0   capsaicin (ZOSTRIX) 0.025 % cream Apply topically 4 (four) times daily as needed. 60 g 0   carvedilol (COREG) 6.25 MG tablet Take 1 tablet (6.25 mg total) by mouth 2 (two) times daily with a meal. 180 tablet 1   diclofenac (VOLTAREN) 75 MG EC tablet Take 1 tablet (75 mg total) by mouth 2 (two) times daily. 60 tablet 2   DULoxetine (CYMBALTA) 60 MG capsule Take 1 capsule (60 mg total) by mouth daily. 30 capsule 3   fluticasone (FLONASE) 50 MCG/ACT nasal spray Place 2 sprays into both nostrils daily. 16 g 6   gabapentin (NEURONTIN) 300 MG capsule Take 3 capsules (900 mg total) by mouth 3 (three) times daily. 270 capsule 2   hydrocortisone cream 0.5 % Apply 1 application topically daily. Apply to lips 30 g 1   metFORMIN (GLUCOPHAGE) 1000 MG tablet Take 1 tablet (1,000 mg total) by mouth 2 (two) times daily with a meal. For DIABETES. 180 tablet 1   Misc. Devices MISC Please provide patient with an insurance approved QUAD CANE 1 each 0   mupirocin ointment (BACTROBAN) 2 % Apply 1 Application topically 2 (two) times daily. 22 g 0   nitroGLYCERIN (NITROSTAT) 0.4 MG SL tablet Dissolve 1 tablet under the tongue every 5 minutes as needed for chest pain. Max of 3  doses, then 911. 25 tablet 6   traZODone (DESYREL) 50 MG tablet Take 1-2 tablets (50-100 mg total) by mouth at bedtime. For SLEEP 180 tablet 1   triamcinolone (KENALOG) 0.025 % ointment Apply 1 Application topically 2 (two) times daily onto the back of the head 454 g 1   valsartan (DIOVAN) 160 MG tablet Take 1 tablet (160 mg total) by mouth daily. For blood pressure 90 tablet 2   Dulaglutide (TRULICITY) 1.5 MG/0.5ML SOPN Inject 1.5 mg into the skin once a week. FOR DIABETES 6 mL 1   Insulin Glargine (BASAGLAR KWIKPEN) 100 UNIT/ML Inject 50 Units into the skin daily. Inject in the morning. 15 mL 2   Blood Pressure Monitor DEVI Please provide patient with insurance approved blood pressure monitor (  Patient not taking: Reported on 04/24/2023) 1 each 0   glucose blood (ACCU-CHEK GUIDE) test strip Use as instructed. Check blood glucose by fingerstick twice per day. (Patient not taking: Reported on 04/24/2023) 100 each 12   rosuvastatin (CRESTOR) 20 MG tablet Take 1 tablet (20 mg total) by mouth daily. STOP atorvastatin. (Patient not taking: Reported on 04/24/2023) 90 tablet 3   senna-docusate (SENOKOT-S) 8.6-50 MG tablet Take 2 tablets by mouth daily. FOR CONSTIPATION (Patient not taking: Reported on 04/24/2023) 90 tablet 3   valACYclovir (VALTREX) 1000 MG tablet Take 1 tablet (1,000 mg total) by mouth 3 (three) times daily. (Patient not taking: Reported on 04/24/2023) 21 tablet 0   Continuous Glucose Receiver (FREESTYLE LIBRE 3 READER) DEVI Use to check blood sugar continuously throughout the day. (Patient not taking: Reported on 04/24/2023) 1 each 0   Continuous Glucose Sensor (FREESTYLE LIBRE 3 PLUS SENSOR) MISC Change sensor every 15 days. Use to check blood sugar continuously. (Patient not taking: Reported on 04/24/2023) 2 each 6   Facility-Administered Medications Prior to Visit  Medication Dose Route Frequency Provider Last Rate Last Admin   sodium chloride 0.9 % bolus 1,000 mL  1,000 mL Intravenous Once Georgian Co M, PA-C   Stopped at 11/07/22 1730    Allergies  Allergen Reactions   Victoza [Liraglutide] Swelling    Lip swelling, itching, rash       Objective:    BP (!) 171/71 (BP Location: Left Arm, Patient Position: Sitting, Cuff Size: Normal)   Pulse 78   Resp 19   Ht 5\' 5"  (1.651 m)   Wt 145 lb 3.2 oz (65.9 kg)   SpO2 99%   BMI 24.16 kg/m  Wt Readings from Last 3 Encounters:  04/24/23 145 lb 3.2 oz (65.9 kg)  02/07/23 128 lb 6.4 oz (58.2 kg)  12/05/22 142 lb (64.4 kg)    Physical Exam       Patient has been counseled extensively about nutrition and exercise as well as the importance of adherence with medications and regular follow-up. The patient was given clear instructions to go to ER or return to medical center if symptoms don't improve, worsen or new problems develop. The patient verbalized understanding.   Follow-up: Return for See luke for freestyle training and BP check. See me in 3 months.   Claiborne Rigg, FNP-BC [facility] and [facility] [place], [place] [phone]   04/24/2023, 11:33 AM

## 2023-04-25 ENCOUNTER — Telehealth: Payer: Self-pay | Admitting: Pharmacist

## 2023-04-25 ENCOUNTER — Encounter: Payer: Self-pay | Admitting: Nurse Practitioner

## 2023-04-25 ENCOUNTER — Other Ambulatory Visit: Payer: Self-pay

## 2023-04-25 MED ORDER — AMLODIPINE BESYLATE 10 MG PO TABS
10.0000 mg | ORAL_TABLET | Freq: Every day | ORAL | 1 refills | Status: DC
  Filled 2023-04-25 – 2023-06-06 (×2): qty 90, 90d supply, fill #0
  Filled 2023-08-28: qty 90, 90d supply, fill #1

## 2023-04-25 MED ORDER — ROSUVASTATIN CALCIUM 20 MG PO TABS
20.0000 mg | ORAL_TABLET | Freq: Every day | ORAL | 3 refills | Status: AC
  Filled 2023-04-25 – 2023-07-13 (×2): qty 90, 90d supply, fill #0
  Filled 2023-12-15: qty 90, 90d supply, fill #1

## 2023-04-25 NOTE — Telephone Encounter (Signed)
Can we submit a PA for this patient's Carl Clark?

## 2023-04-26 ENCOUNTER — Other Ambulatory Visit: Payer: Self-pay

## 2023-04-26 ENCOUNTER — Encounter: Payer: Self-pay | Admitting: Nurse Practitioner

## 2023-04-26 LAB — CMP14+EGFR
ALT: 16 [IU]/L (ref 0–44)
AST: 16 [IU]/L (ref 0–40)
Albumin: 3.9 g/dL (ref 3.8–4.8)
Alkaline Phosphatase: 81 [IU]/L (ref 44–121)
BUN/Creatinine Ratio: 23 (ref 10–24)
BUN: 23 mg/dL (ref 8–27)
Bilirubin Total: 0.3 mg/dL (ref 0.0–1.2)
CO2: 25 mmol/L (ref 20–29)
Calcium: 9.2 mg/dL (ref 8.6–10.2)
Chloride: 105 mmol/L (ref 96–106)
Creatinine, Ser: 1 mg/dL (ref 0.76–1.27)
Globulin, Total: 3.8 g/dL (ref 1.5–4.5)
Glucose: 151 mg/dL — ABNORMAL HIGH (ref 70–99)
Potassium: 4.7 mmol/L (ref 3.5–5.2)
Sodium: 140 mmol/L (ref 134–144)
Total Protein: 7.7 g/dL (ref 6.0–8.5)
eGFR: 80 mL/min/{1.73_m2} (ref >59)

## 2023-04-26 LAB — MICROALBUMIN / CREATININE URINE RATIO
Creatinine, Urine: 63 mg/dL
Microalb/Creat Ratio: 59 mg/g{creat} — ABNORMAL HIGH (ref 0–29)
Microalbumin, Urine: 37 ug/mL

## 2023-04-26 LAB — CBC WITH DIFFERENTIAL/PLATELET
Basophils Absolute: 0.1 10*3/uL (ref 0.0–0.2)
Basos: 2 %
EOS (ABSOLUTE): 0.4 10*3/uL (ref 0.0–0.4)
Eos: 9 %
Hematocrit: 35.7 % — ABNORMAL LOW (ref 37.5–51.0)
Hemoglobin: 11.9 g/dL — ABNORMAL LOW (ref 13.0–17.7)
Immature Grans (Abs): 0 10*3/uL (ref 0.0–0.1)
Immature Granulocytes: 0 %
Lymphocytes Absolute: 1.4 10*3/uL (ref 0.7–3.1)
Lymphs: 34 %
MCH: 31.7 pg (ref 26.6–33.0)
MCHC: 33.3 g/dL (ref 31.5–35.7)
MCV: 95 fL (ref 79–97)
Monocytes Absolute: 0.4 10*3/uL (ref 0.1–0.9)
Monocytes: 11 %
Neutrophils Absolute: 1.8 10*3/uL (ref 1.4–7.0)
Neutrophils: 44 %
Platelets: 250 10*3/uL (ref 150–450)
RBC: 3.75 x10E6/uL — ABNORMAL LOW (ref 4.14–5.80)
RDW: 11.9 % (ref 11.6–15.4)
WBC: 4 10*3/uL (ref 3.4–10.8)

## 2023-05-04 ENCOUNTER — Other Ambulatory Visit: Payer: Self-pay

## 2023-05-05 ENCOUNTER — Ambulatory Visit: Payer: Medicare HMO | Admitting: Pharmacist

## 2023-06-06 ENCOUNTER — Encounter: Payer: Self-pay | Admitting: Pharmacist

## 2023-06-06 ENCOUNTER — Other Ambulatory Visit: Payer: Self-pay | Admitting: Nurse Practitioner

## 2023-06-06 ENCOUNTER — Ambulatory Visit: Payer: Medicare HMO | Attending: Nurse Practitioner | Admitting: Pharmacist

## 2023-06-06 ENCOUNTER — Other Ambulatory Visit: Payer: Self-pay

## 2023-06-06 DIAGNOSIS — Z794 Long term (current) use of insulin: Secondary | ICD-10-CM | POA: Diagnosis not present

## 2023-06-06 DIAGNOSIS — Z7985 Long-term (current) use of injectable non-insulin antidiabetic drugs: Secondary | ICD-10-CM | POA: Diagnosis not present

## 2023-06-06 DIAGNOSIS — Z7984 Long term (current) use of oral hypoglycemic drugs: Secondary | ICD-10-CM | POA: Diagnosis not present

## 2023-06-06 DIAGNOSIS — E1165 Type 2 diabetes mellitus with hyperglycemia: Secondary | ICD-10-CM

## 2023-06-06 DIAGNOSIS — I10 Essential (primary) hypertension: Secondary | ICD-10-CM

## 2023-06-06 DIAGNOSIS — B9689 Other specified bacterial agents as the cause of diseases classified elsewhere: Secondary | ICD-10-CM

## 2023-06-06 MED ORDER — CETIRIZINE HCL 10 MG PO TABS
10.0000 mg | ORAL_TABLET | Freq: Every day | ORAL | 11 refills | Status: DC
  Filled 2023-06-06: qty 30, 30d supply, fill #0
  Filled 2023-08-28: qty 30, 30d supply, fill #1

## 2023-06-06 MED ORDER — AZITHROMYCIN 250 MG PO TABS
ORAL_TABLET | ORAL | 0 refills | Status: DC
Start: 2023-06-06 — End: 2023-07-13

## 2023-06-06 MED ORDER — FLUTICASONE PROPIONATE 50 MCG/ACT NA SUSP
2.0000 | Freq: Every day | NASAL | 6 refills | Status: DC
  Filled 2023-06-06: qty 16, 30d supply, fill #0

## 2023-06-06 NOTE — Progress Notes (Signed)
 S:    PCP: [name]  No chief complaint on file.  Patient arrives in good spirits.  Presents for diabetes and HTN management at the request of [name]. Patient was seen and referred on 04/24/2023. At that time, BP was 171/71 mmHg. A1c showed some improvement down to 9.8 (from 10.9 prior). We increased the Trulicity dose at that time.  Pharmacy last saw him on 12/13/2022. At that time, it was difficult to give priority to his diabetes d/t shingles infection and post-herpetic neuralgia.   Today, pt reports to clinic with continued PHN unchanged from last visit. He is not taking any of the pain medication prescribed to him for the neuralgia but requests refills today. Additionally, he is complaining of 4-day onset of increased congestion, runny nose with post-nasal drip, and fever. Also complains of chest congestion and coughing up yellow phlegm. Also complains of malaise.   Regarding his DM, he is not checking his blood sugars at home but tells me he "finished all of his diabetes medications". No DM-specific concerns today other than requesting refills. I have listed his last fill dates with his medications below.   Lastly, he tells me he took all of his antihypertensives this morning. Denies any chest pains, dizziness, or HA. He is not checking BP at home.   Family/Social History:  - FHx: DM (mother, father) - Tobacco: never smoker - Alcohol: reports occasional use   Insurance coverage/medication affordability: [employer] Medicare  Patient denies adherence to medications. He is behind on every refill below. OF note, he did take BP medications. The fact that he has BP medication left to take after filling >3 months ago attests to his non-adherence.   Current diabetes medications include:  - Basaglar 50 daily (last filled 04/24/23 for a 30-day) - Metformin 1000 mg BID (last filled 11/14/22 for a 90-ds) - Trulicity 3 mg weekly (last filled 04/24/23 for a 28 day)  Current HTN medications include:  -  Amlodipine 10 mg daily (last dispensed 11/09/22 for a 90-day) - Carvedilol 6.25 mg BID (last dispensed 02/23/23 for a 90-day) - Valsartan 160 mg daily (last dispensed 02/23/23 for a 90-day)  Patient denies hypoglycemia.  Patient reported dietary habits:  - He does not limit carbohydrates  Patient-reported exercise habits:  - Does not exercise outside of work   Patient denies polyuria. Denies polydipsia.  Patient denies neuropathy. Patient denies visual changes. Patient reports self foot exams.   O:  Home glucometer: - Fasting: no meter today.  Lab Results  Component Value Date   HGBA1C 9.8 (A) 04/24/2023   There were no vitals filed for this visit.  Lipid Panel     Component Value Date/Time   CHOL 72 (L) 12/05/2022 0939   TRIG 75 12/05/2022 0939   HDL 30 (L) 12/05/2022 0939   CHOLHDL 2.4 12/05/2022 0939   CHOLHDL 4.8 09/12/2016 0806   VLDL 29 09/12/2016 0806   LDLCALC 26 12/05/2022 0939   Clinical ASCVD: Yes - CAD, NSTEMI/CABG The ASCVD Risk score (Arnett DK, et al., 2019) failed to calculate for the following reasons:   The valid total cholesterol range is 130 to 320 mg/dL   A/P: Diabetes longstanding currently uncontrolled. A1c shows improvement but remains above goal d/t suboptimal adherence. He is asymptomatic from a hyper- or hypoglycemic standpoint today. We will refill his medication for now without any changes. PHN is understandably his biggest concern today. Encouraged him to pick-up and resume duloxetine and gabapentin. Additionally, I will have him refill  diclofenac. Will message his PCP concerning his respiratory symptoms. -Continue current regimen.  -90-day supplies filled for metformin. 30-day supplies for insulin and Trulicity. -Extensively discussed pathophysiology of DM, recommended lifestyle interventions, dietary effects on glycemic control -Counseled on s/sx of and management of hypoglycemia -Next A1C anticipated 07/2022  Hypertension longstanding  currently at goal on current medications. BP goal < 130/80 mmHg. Given last clinic visit's BP and his fill hx, I believe his BP was so elevated last visit secondary to non-adherence. He took his antihypertensives this morning and BP is at goal. -Restarted amlodipine, carvedilol, and valsartan. Pt will fill at our pharmacy for 90-days..  -Counseled on lifestyle modifications for blood pressure control including reduced dietary sodium, increased exercise, adequate sleep. -Encouraged patient to check BP at home and bring log of readings to next visit. Counseled on proper use of home BP cuff.   Results reviewed and written information provided.    Written patient instructions provided. Patient verbalized understanding of treatment plan.  Total time in face to face counseling 30 minutes.    Written patient instructions provided.  Total time in face to face counseling 30 minutes.   Follow up w/ me in 1 month.   Butch Penny, PharmD, Patsy Baltimore, CPP Clinical Pharmacist [facility] & [facility] [phone]

## 2023-06-08 NOTE — Progress Notes (Signed)
 Unable to reach patient by phone to relay response. Unable to leave voicemail.

## 2023-07-13 ENCOUNTER — Ambulatory Visit: Attending: Nurse Practitioner | Admitting: Pharmacist

## 2023-07-13 ENCOUNTER — Other Ambulatory Visit (HOSPITAL_COMMUNITY): Payer: Self-pay

## 2023-07-13 ENCOUNTER — Encounter: Payer: Self-pay | Admitting: Pharmacist

## 2023-07-13 ENCOUNTER — Other Ambulatory Visit: Payer: Self-pay

## 2023-07-13 DIAGNOSIS — Z794 Long term (current) use of insulin: Secondary | ICD-10-CM | POA: Diagnosis not present

## 2023-07-13 DIAGNOSIS — Z7984 Long term (current) use of oral hypoglycemic drugs: Secondary | ICD-10-CM | POA: Diagnosis not present

## 2023-07-13 DIAGNOSIS — E1165 Type 2 diabetes mellitus with hyperglycemia: Secondary | ICD-10-CM | POA: Diagnosis not present

## 2023-07-13 DIAGNOSIS — B9689 Other specified bacterial agents as the cause of diseases classified elsewhere: Secondary | ICD-10-CM

## 2023-07-13 DIAGNOSIS — J069 Acute upper respiratory infection, unspecified: Secondary | ICD-10-CM

## 2023-07-13 DIAGNOSIS — Z7985 Long-term (current) use of injectable non-insulin antidiabetic drugs: Secondary | ICD-10-CM | POA: Diagnosis not present

## 2023-07-13 MED ORDER — OLOPATADINE HCL 0.2 % OP SOLN
OPHTHALMIC | 6 refills | Status: DC
  Filled 2023-07-13: qty 2.5, 25d supply, fill #0

## 2023-07-13 MED ORDER — AZITHROMYCIN 250 MG PO TABS
ORAL_TABLET | ORAL | 0 refills | Status: AC
  Filled 2023-07-13: qty 6, 5d supply, fill #0

## 2023-07-13 NOTE — Progress Notes (Signed)
 S:    PCP: [name]  No chief complaint on file.  Patient arrives in good spirits.  Presents for diabetes and HTN management at the request of [name]. Patient was seen and referred on 04/24/2023. At that time, BP was 171/71 mmHg. A1c showed some improvement down to 9.8 (from 10.9 prior). We increased the Trulicity  dose at that time.  Pharmacy last saw him on 06/06/2023. His main barrier is medication non-adherence due to falling behind on his refills. I sent refills of all medications as he was, at times, several months behind on his DM and HTN medications. Additionally, he complained of sx of URI and his provider sent in antibiotics to complete. He did not pick these up and requests them today.  Today, pt reports to clinic with continued URI symptoms. He is amenable to picking up his antibiotics that were prescribed last month.   Regarding his DM, he is checking his blood sugars at home seldomly. He continues to request CGM and we discussed that his insurance covers [place] but the copays are cost-prohibitive at this time. He received a 90-day supply of metformin  last month but only ~1 month of his insulin  and Trulicity . He has been without these for ~ 1 week. His insurance does not allow 90-day fills on injectables.  Lastly, he tells me he took all of his antihypertensives this morning. Denies any chest pains, dizziness, or HA. He is not checking BP at home. He received 90-ds of all antihypertensives last month and endorses compliance with these.   Family/Social History:  - FHx: DM (mother, father) - Tobacco: never smoker - Alcohol: reports occasional use   Insurance coverage/medication affordability: [employer] Medicare  Patient reports adherence to medications. He is only behind on his Trulicity  and Basaglar  fills. This is significant improvement since last visit.   Current diabetes medications include:  - Basaglar  50 daily (last filled 06/06/2023 for a 30-day; pt tells me he is taking 30 units  BID) - Metformin  1000 mg BID (last filled 06/06/23 for a 90-ds) - Trulicity  3 mg weekly (last filled 06/06/23 for a 28 day)  Current HTN medications include:  - Amlodipine  10 mg daily (last dispensed 06/06/2023 for a 90-day) - Carvedilol  6.25 mg BID (last dispensed 06/06/2023 for a 90-day) - Valsartan  160 mg daily (last dispensed 06/06/2023 for a 90-day)  Patient denies hypoglycemia. Becomes aware when his sugar is in the 90s.  Patient reported dietary habits:  - He does not limit carbohydrates  Patient-reported exercise habits:  - Does not exercise outside of work   Patient denies polyuria. Denies polydipsia.  Patient denies neuropathy. Patient denies visual changes. Patient reports self foot exams.   O:  Home glucometer: - Fasting: no meter today. Reports 90s - low 200s since last visit  Lab Results  Component Value Date   HGBA1C 9.8 (A) 04/24/2023   There were no vitals filed for this visit.  Lipid Panel     Component Value Date/Time   CHOL 72 (L) 12/05/2022 0939   TRIG 75 12/05/2022 0939   HDL 30 (L) 12/05/2022 0939   CHOLHDL 2.4 12/05/2022 0939   CHOLHDL 4.8 09/12/2016 0806   VLDL 29 09/12/2016 0806   LDLCALC 26 12/05/2022 0939   Clinical ASCVD: Yes - CAD, NSTEMI/CABG The ASCVD Risk score (Arnett DK, et al., 2019) failed to calculate for the following reasons:   Risk score cannot be calculated because patient has a medical history suggesting prior/existing ASCVD   A/P: Diabetes longstanding currently  uncontrolled. Reported home sugars suggest improvement secondary to improved adherence to medications. He is asymptomatic from a hyper- or hypoglycemic standpoint today. We will refill his Trulicity  and Lantus  today. All other medications are up to date. Commended him for this.  -Continue current regimen.  -90-day supplies filled for metformin . 30-day supplies for insulin  and Trulicity . -Extensively discussed pathophysiology of DM, recommended lifestyle interventions,  dietary effects on glycemic control -Counseled on s/sx of and management of hypoglycemia -Next A1C anticipated 07/2022 -Directed him downstairs to pick up antibiotic that his PCP rx'd last month.   Results reviewed and written information provided.    Written patient instructions provided. Patient verbalized understanding of treatment plan.  Total time in face to face counseling 30 minutes.    Written patient instructions provided.  Total time in face to face counseling 30 minutes.   Follow up w/ me in 2 months. PCP 07/26/2023.  Marene Shape, PharmD, Becky Bowels, CPP Clinical Pharmacist [facility] & [facility] [phone]

## 2023-07-26 ENCOUNTER — Encounter: Payer: Self-pay | Admitting: Nurse Practitioner

## 2023-07-26 ENCOUNTER — Other Ambulatory Visit: Payer: Self-pay

## 2023-07-26 ENCOUNTER — Ambulatory Visit: Payer: Self-pay | Attending: Nurse Practitioner | Admitting: Nurse Practitioner

## 2023-07-26 VITALS — BP 134/75 | HR 60 | Resp 19 | Ht 65.0 in | Wt 150.6 lb

## 2023-07-26 DIAGNOSIS — H1013 Acute atopic conjunctivitis, bilateral: Secondary | ICD-10-CM

## 2023-07-26 DIAGNOSIS — E119 Type 2 diabetes mellitus without complications: Secondary | ICD-10-CM

## 2023-07-26 DIAGNOSIS — I10 Essential (primary) hypertension: Secondary | ICD-10-CM

## 2023-07-26 DIAGNOSIS — I25119 Atherosclerotic heart disease of native coronary artery with unspecified angina pectoris: Secondary | ICD-10-CM | POA: Diagnosis not present

## 2023-07-26 DIAGNOSIS — M25512 Pain in left shoulder: Secondary | ICD-10-CM | POA: Diagnosis not present

## 2023-07-26 DIAGNOSIS — Z1211 Encounter for screening for malignant neoplasm of colon: Secondary | ICD-10-CM | POA: Diagnosis not present

## 2023-07-26 DIAGNOSIS — Z794 Long term (current) use of insulin: Secondary | ICD-10-CM | POA: Diagnosis not present

## 2023-07-26 LAB — POCT GLYCOSYLATED HEMOGLOBIN (HGB A1C): Hemoglobin A1C: 7.2 % — AB (ref 4.0–5.6)

## 2023-07-26 MED ORDER — OLOPATADINE HCL 0.2 % OP SOLN
OPHTHALMIC | 6 refills | Status: DC
  Filled 2023-07-26: qty 2.5, 25d supply, fill #0
  Filled 2023-08-28: qty 2.5, 25d supply, fill #1

## 2023-07-26 MED ORDER — CARVEDILOL 6.25 MG PO TABS
6.2500 mg | ORAL_TABLET | Freq: Two times a day (BID) | ORAL | 1 refills | Status: DC
  Filled 2023-07-26 – 2023-08-28 (×2): qty 180, 90d supply, fill #0
  Filled 2023-11-13 (×2): qty 180, 90d supply, fill #1

## 2023-07-26 MED ORDER — BASAGLAR KWIKPEN 100 UNIT/ML ~~LOC~~ SOPN
50.0000 [IU] | PEN_INJECTOR | Freq: Every day | SUBCUTANEOUS | 2 refills | Status: DC
  Filled 2023-07-26 – 2023-08-28 (×2): qty 15, 30d supply, fill #0

## 2023-07-26 MED ORDER — ACETAMINOPHEN 500 MG PO TABS
500.0000 mg | ORAL_TABLET | Freq: Four times a day (QID) | ORAL | 1 refills | Status: DC | PRN
  Filled 2023-07-26: qty 60, 15d supply, fill #0

## 2023-07-26 NOTE — Progress Notes (Signed)
 Assessment & Plan:  [name] was seen today for diabetes.  Diagnoses and all orders for this visit:  Type 2 diabetes mellitus with insulin  therapy (HCC) -     POCT glycosylated hemoglobin (Hb A1C) -     Insulin  Glargine (BASAGLAR  KWIKPEN) 100 UNIT/ML; Inject 50 Units into the skin daily. Inject in the morning. -     CMP14+EGFR Continue blood sugar control as discussed in office today, low carbohydrate diet, and regular physical exercise as tolerated, 150 minutes per week (30 min each day, 5 days per week, or 50 min 3 days per week). Keep blood sugar logs with fasting goal of 90-130 mg/dl, post prandial (after you eat) less than 180.  For Hypoglycemia: BS <60 and Hyperglycemia BS >400; contact the clinic ASAP. Annual eye exams and foot exams are recommended.   Primary hypertension -     carvedilol  (COREG ) 6.25 MG tablet; Take 1 tablet (6.25 mg total) by mouth 2 (two) times daily with a meal. Continue all antihypertensives as prescribed.  Reminded to bring in blood pressure log for follow  up appointment.  RECOMMENDATIONS: DASH/Mediterranean Diets are healthier choices for HTN.    Atherosclerosis of native coronary artery of native heart with angina pectoris (HCC) -     carvedilol  (COREG ) 6.25 MG tablet; Take 1 tablet (6.25 mg total) by mouth 2 (two) times daily with a meal.  Colon cancer screening -     Fecal occult blood, imunochemical([facility])  Acute pain of left shoulder -     acetaminophen  (TYLENOL ) 500 MG tablet; Take 1 tablet (500 mg total) by mouth every 6 (six) hours as needed for moderate pain (pain score 4-6).  Allergic conjunctivitis of both eyes -     Olopatadine  HCl (PATADAY ) 0.2 % SOLN; Apply 1 drop to both eyes daily.    Patient has been counseled on age-appropriate routine health concerns for screening and prevention. These are reviewed and up-to-date. Referrals have been placed accordingly. Immunizations are up-to-date or declined.    Subjective:    Chief Complaint  Patient presents with   Diabetes    Carl Clark 73 y.o. male presents to office today today for follow up to HTN and DM   History of hypertension, poorly controlled yet diabetes, coronary artery disease, carotid artery disease, NSTEMI (08-2016)postherpetic neuralgia and weakness of left arm (09-2022) .   VRI was used to communicate directly with patient for the entire encounter including providing detailed patient instructions.      DM 2 A1c improved and now down from 9.8 to 7.2.  Lab Results  Component Value Date   HGBA1C 7.2 (A) 07/26/2023    Lab Results  Component Value Date   HGBA1C 9.8 (A) 04/24/2023     HTN Blood pressure is well controlled. He admits adherence with amlodipine  10 daily, carvedilol  6.25 BID, and valsartan  160 mg daily.  BP Readings from Last 3 Encounters:  07/26/23 134/75  04/24/23 (!) 171/71  02/07/23 124/64    Shoulder Pain: Patient complaints of left shoulder pain. This is unrelated to any injury or trauma.  He had shingles several years ago and would like to know if the pain currently in his shoulder is related to shingles.  The pain is described as aching and dull.  The onset of the pain was  several weeks ago .  The pain occurs several times per day and lasts for several minutes.  Location is global. No history of dislocation. Symptoms are aggravated by all activities. Symptoms  are diminished by  rest.   Limited activities include: reaching, lifting, pulling, pushing, carrying, throwing, repetitive use, difficulty sleeping on affected side.      Review of Systems  Constitutional:  Negative for fever, malaise/fatigue and weight loss.  HENT: Negative.  Negative for nosebleeds.   Eyes:  Positive for redness. Negative for blurred vision, double vision and photophobia.  Respiratory: Negative.  Negative for cough and shortness of breath.   Cardiovascular: Negative.  Negative for chest pain, palpitations and leg swelling.   Gastrointestinal: Negative.  Negative for heartburn, nausea and vomiting.  Musculoskeletal:  Positive for joint pain. Negative for myalgias.  Neurological: Negative.  Negative for dizziness, focal weakness, seizures and headaches.  Endo/Heme/Allergies:  Positive for environmental allergies.  Psychiatric/Behavioral: Negative.  Negative for suicidal ideas.     Past Medical History:  Diagnosis Date   CAD (coronary artery disease) 03/16/2017   S/p NSTEMI 6/18 >> LHC: multivessel CAD >> s/p CABG // Echo 6/18: Mild LVH, EF 60-65, normal wall motion, grade 1 diastolic dysfunction, normal RV SF, PASP 36 (borderline pulmonary hypertension) // Pre-CABG Dopplers 09/13/16: Bilateral ICA 1-39   Echocardiogram    Echo 09/2018: EF 60-65, normal wall motion, RVSP 24.9   Family history of anesthesia complication    "never had anesthesia" (07/14/2012)   History of non-ST elevation myocardial infarction (NSTEMI) 09/12/2016   Treated with CABG   History of rectal bleeding    secondary to diverticulum affecting right hepatic flexure - 06/2012   Hypertension    Mild carotid artery disease (HCC)    a. 1-39% bilaterally preCABG in 2018.   Type II diabetes mellitus [facility])     Past Surgical History:  Procedure Laterality Date   COLONOSCOPY Left 07/14/2012   Procedure: COLONOSCOPY;  Surgeon: Evangeline Hilts, MD;  Location[facility] ENDOSCOPY;  Service: Endoscopy;  Laterality: Left;   CORONARY ARTERY BYPASS GRAFT N/A 09/15/2016   Procedure: CORONARY ARTERY BYPASS GRAFTING (CABG) x 3, LIMA to LAD, SVG to DIAGONAL, SVG to OM2, USING LEFT MAMMARY ARTERY AND RIGHT GREATER SAPHENOUS VEIN HARVESTED ENDOSCOPICALLY;  Surgeon: Norita Beauvais, MD;  Location: [facility] OR;  Service: Open Heart Surgery;  Laterality: N/A;   ESOPHAGOGASTRODUODENOSCOPY N/A 07/13/2012   Procedure: ESOPHAGOGASTRODUODENOSCOPY (EGD);  Surgeon: Evangeline Hilts, MD;  Location: [facility] ENDOSCOPY;  Service: Endoscopy;  Laterality: N/A;  pt in ED A8   LEFT HEART CATH AND  CORONARY ANGIOGRAPHY N/A 09/12/2016   Procedure: Left Heart Cath and Coronary Angiography;  Surgeon: Arnoldo Lapping, MD;  Location: [facility] INVASIVE CV LAB;  Service: Cardiovascular;  Laterality: N/A;   NO PAST SURGERIES     TEE WITHOUT CARDIOVERSION N/A 09/15/2016   Procedure: TRANSESOPHAGEAL ECHOCARDIOGRAM (TEE);  Surgeon: Norita Beauvais, MD;  Location: [facility] OR;  Service: Open Heart Surgery;  Laterality: N/A;    Family History  Problem Relation Age of Onset   Diabetes Mother    Diabetes Father     Social History Reviewed with no changes to be made today.   Outpatient Medications Prior to Visit  Medication Sig Dispense Refill   Accu-Chek Softclix Lancets lancets Use as instructed. Inject into the skin twice daily 100 each 3   amLODipine  (NORVASC ) 10 MG tablet Take 1 tablet (10 mg total) by mouth daily. FOR BLOOD PRESSURE 90 tablet 1   aspirin  EC 81 MG tablet Take 1 tablet (81 mg total) by mouth daily. Swallow whole. 90 tablet 3   Blood Glucose Monitoring Suppl (ACCU-CHEK GUIDE ME) w/Device KIT Use to check blood  sugar 2 times daily. E11.9 1 kit 0   cetirizine  (ZYRTEC ) 10 MG tablet Take 1 tablet (10 mg total) by mouth daily. 30 tablet 11   Dulaglutide  (TRULICITY ) 3 MG/0.5ML SOAJ Inject 3 mg as directed once a week. 2 mL 6   DULoxetine  (CYMBALTA ) 60 MG capsule Take 1 capsule (60 mg total) by mouth daily. 30 capsule 3   fluticasone  (FLONASE ) 50 MCG/ACT nasal spray Place 2 sprays into both nostrils daily. 16 g 6   gabapentin  (NEURONTIN ) 300 MG capsule Take 3 capsules (900 mg total) by mouth 3 (three) times daily. 270 capsule 2   metFORMIN  (GLUCOPHAGE ) 1000 MG tablet Take 1 tablet (1,000 mg total) by mouth 2 (two) times daily with a meal. For DIABETES. 180 tablet 1   Misc. Devices MISC Please provide patient with an insurance approved QUAD CANE 1 each 0   rosuvastatin  (CRESTOR ) 20 MG tablet Take 1 tablet (20 mg total) by mouth daily. STOP atorvastatin . FOR CHOLESTEROL 90 tablet 3   valsartan   (DIOVAN ) 160 MG tablet Take 1 tablet (160 mg total) by mouth daily. For blood pressure 90 tablet 2   acetaminophen  (TYLENOL ) 500 MG tablet Take 2 tablets (1,000 mg total) by mouth every 6 (six) hours as needed for moderate pain. 100 tablet 0   carvedilol  (COREG ) 6.25 MG tablet Take 1 tablet (6.25 mg total) by mouth 2 (two) times daily with a meal. 180 tablet 1   Insulin  Glargine (BASAGLAR  KWIKPEN) 100 UNIT/ML Inject 50 Units into the skin daily. Inject in the morning. 15 mL 2   glucose blood (ACCU-CHEK GUIDE) test strip Use as instructed. Check blood glucose by fingerstick twice per day. (Patient not taking: Reported on 07/26/2023) 100 each 12   nitroGLYCERIN  (NITROSTAT ) 0.4 MG SL tablet Dissolve 1 tablet under the tongue every 5 minutes as needed for chest pain. Max of 3 doses, then 911. (Patient not taking: Reported on 07/26/2023) 25 tablet 6   traZODone  (DESYREL ) 50 MG tablet Take 1-2 tablets (50-100 mg total) by mouth at bedtime. For SLEEP (Patient not taking: Reported on 07/26/2023) 180 tablet 1   triamcinolone  (KENALOG ) 0.025 % ointment Apply 1 Application topically 2 (two) times daily onto the back of the head (Patient not taking: Reported on 07/26/2023) 454 g 1   valACYclovir  (VALTREX ) 1000 MG tablet Take 1 tablet (1,000 mg total) by mouth 3 (three) times daily. (Patient not taking: Reported on 04/24/2023) 21 tablet 0   Blood Pressure Monitor DEVI Please provide patient with insurance approved blood pressure monitor (Patient not taking: Reported on 07/26/2023) 1 each 0   capsaicin  (ZOSTRIX) 0.025 % cream Apply topically 4 (four) times daily as needed. (Patient not taking: Reported on 07/26/2023) 60 g 0   Continuous Glucose Receiver (FREESTYLE LIBRE 3 READER) DEVI Use to check blood sugar continuously throughout the day. (Patient not taking: Reported on 07/26/2023) 1 each 0   Continuous Glucose Sensor (FREESTYLE LIBRE 3 PLUS SENSOR) MISC Change sensor every 15 days. Use to check blood sugar continuously. (Patient  not taking: Reported on 07/26/2023) 2 each 6   diclofenac  (VOLTAREN ) 75 MG EC tablet Take 1 tablet (75 mg total) by mouth 2 (two) times daily. (Patient not taking: Reported on 07/26/2023) 60 tablet 2   hydrocortisone  cream 0.5 % Apply 1 application topically daily. Apply to lips (Patient not taking: Reported on 07/26/2023) 30 g 1   mupirocin  ointment (BACTROBAN ) 2 % Apply 1 Application topically 2 (two) times daily. (Patient not taking: Reported on 07/26/2023)  22 g 0   Olopatadine  HCl (PATADAY ) 0.2 % SOLN Apply 1 drop to both eyes daily. (Patient not taking: Reported on 07/26/2023) 2.5 mL 6   senna-docusate (SENOKOT-S) 8.6-50 MG tablet Take 2 tablets by mouth daily. FOR CONSTIPATION (Patient not taking: Reported on 02/07/2023) 90 tablet 3   Facility-Administered Medications Prior to Visit  Medication Dose Route Frequency Provider Last Rate Last Admin   sodium chloride  0.9 % bolus 1,000 mL  1,000 mL Intravenous Once Dulce Gibbs M, PA-C   Stopped at 11/07/22 1730    Allergies  Allergen Reactions   Victoza  [Liraglutide ] Swelling    Lip swelling, itching, rash       Objective:    BP 134/75 (BP Location: Left Arm, Patient Position: Sitting, Cuff Size: Normal)   Pulse 60   Resp 19   Ht 5\' 5"  (1.651 m)   Wt 150 lb 9.6 oz (68.3 kg)   SpO2 100%   BMI 25.06 kg/m  Wt Readings from Last 3 Encounters:  07/26/23 150 lb 9.6 oz (68.3 kg)  04/24/23 143 lb 3.2 oz (65 kg)  02/07/23 128 lb 6.4 oz (58.2 kg)    Physical Exam Vitals and nursing note reviewed.  Constitutional:      Appearance: He is well-developed.  HENT:     Head: Normocephalic and atraumatic.  Cardiovascular:     Rate and Rhythm: Normal rate and regular rhythm.     Heart sounds: Normal heart sounds. No murmur heard.    No friction rub. No gallop.  Pulmonary:     Effort: Pulmonary effort is normal. No tachypnea or respiratory distress.     Breath sounds: Normal breath sounds. No decreased breath sounds, wheezing, rhonchi or rales.   Chest:     Chest wall: No tenderness.  Abdominal:     General: Bowel sounds are normal.     Palpations: Abdomen is soft.  Musculoskeletal:     Left shoulder: Swelling and tenderness present. Decreased range of motion.     Cervical back: Normal range of motion.  Skin:    General: Skin is warm and dry.  Neurological:     Mental Status: He is alert and oriented to person, place, and time.     Coordination: Coordination normal.  Psychiatric:        Behavior: Behavior normal. Behavior is cooperative.        Thought Content: Thought content normal.        Judgment: Judgment normal.          Patient has been counseled extensively about nutrition and exercise as well as the importance of adherence with medications and regular follow-up. The patient was given clear instructions to go to ER or return to medical center if symptoms don't improve, worsen or new problems develop. The patient verbalized understanding.   Follow-up: Return in about 30 days (around 08/25/2023) for shoulder pain.   Collins Dean, FNP-BC [facility] [place], [place] [phone]   07/26/2023, 1:45 PM

## 2023-07-27 LAB — CMP14+EGFR
ALT: 33 IU/L (ref 0–44)
AST: 23 IU/L (ref 0–40)
Albumin: 3.9 g/dL (ref 3.8–4.8)
Alkaline Phosphatase: 56 IU/L (ref 44–121)
BUN/Creatinine Ratio: 12 (ref 10–24)
BUN: 14 mg/dL (ref 8–27)
Bilirubin Total: 0.4 mg/dL (ref 0.0–1.2)
CO2: 23 mmol/L (ref 20–29)
Calcium: 9 mg/dL (ref 8.6–10.2)
Chloride: 108 mmol/L — ABNORMAL HIGH (ref 96–106)
Creatinine, Ser: 1.17 mg/dL (ref 0.76–1.27)
Globulin, Total: 3.7 g/dL (ref 1.5–4.5)
Glucose: 131 mg/dL — ABNORMAL HIGH (ref 70–99)
Potassium: 4.7 mmol/L (ref 3.5–5.2)
Sodium: 143 mmol/L (ref 134–144)
Total Protein: 7.6 g/dL (ref 6.0–8.5)
eGFR: 66 mL/min/{1.73_m2} (ref >59)

## 2023-08-03 ENCOUNTER — Ambulatory Visit: Payer: Self-pay | Admitting: Nurse Practitioner

## 2023-08-15 ENCOUNTER — Ambulatory Visit: Admitting: Pharmacist

## 2023-08-28 ENCOUNTER — Ambulatory Visit: Attending: Nurse Practitioner | Admitting: Nurse Practitioner

## 2023-08-28 ENCOUNTER — Other Ambulatory Visit: Payer: Self-pay | Admitting: Emergency Medicine

## 2023-08-28 ENCOUNTER — Other Ambulatory Visit: Payer: Self-pay

## 2023-08-28 ENCOUNTER — Encounter: Payer: Self-pay | Admitting: Nurse Practitioner

## 2023-08-28 ENCOUNTER — Ambulatory Visit
Admission: RE | Admit: 2023-08-28 | Discharge: 2023-08-28 | Disposition: A | Discharge status: Home / Self Care | Source: Ambulatory Visit | Attending: Nurse Practitioner

## 2023-08-28 VITALS — BP 108/67 | HR 65 | Resp 18 | Ht 65.0 in | Wt 155.0 lb

## 2023-08-28 DIAGNOSIS — M25512 Pain in left shoulder: Secondary | ICD-10-CM

## 2023-08-28 DIAGNOSIS — G8929 Other chronic pain: Secondary | ICD-10-CM | POA: Diagnosis not present

## 2023-08-28 MED ORDER — TRAMADOL HCL 50 MG PO TABS
50.0000 mg | ORAL_TABLET | Freq: Two times a day (BID) | ORAL | 0 refills | Status: DC | PRN
Start: 2023-08-28 — End: 2023-11-28
  Filled 2023-08-28: qty 60, 30d supply, fill #0

## 2023-08-28 NOTE — Patient Instructions (Signed)
 XRAY D[facility] [address] [place], [place] [phone]

## 2023-08-28 NOTE — Progress Notes (Signed)
 Assessment & Plan:  Carl Clark was seen today for shoulder pain.  Diagnoses and all orders for this visit:  Chronic left shoulder pain -     DG Shoulder Left; Future -     traMADol  (ULTRAM ) 50 MG tablet; Take 1 tablet (50 mg total) by mouth every 12 (twelve) hours as needed.    Patient has been counseled on age-appropriate routine health concerns for screening and prevention. These are reviewed and up-to-date. Referrals have been placed accordingly. Immunizations are up-to-date or declined.    Subjective:   Chief Complaint  Patient presents with   Shoulder Pain    Carl Clark 73 y.o. male presents to office today for follow-up to chronic shoulder pain   Shoulder Pain: Patient complaints of left shoulder pain. This is unrelated to any injury or trauma.  Carl Clark had shingles several years ago and would like to know if the pain currently in his shoulder is related to shingles.  The pain is described as aching and dull.  The onset of the pain was several weeks ago.  The pain occurs several times per day and lasts for several minutes.  Location is global. No history of dislocation. Symptoms are aggravated by all activities. Symptoms are diminished by  rest.   Limited activities include: reaching, lifting, pulling, pushing, carrying, throwing, repetitive use, difficulty sleeping on affected side.  At his last visit with me on Jul 26, 2023 the prescribed extra strength Tylenol  which Carl Clark reports today has been ineffective.  Carl Clark is also taking gabapentin  900 mg 3 times daily for neuropathy.  At this time we will proceed with an x-ray and I will prescribe him tramadol  for his pain.     Review of Systems  Constitutional:  Negative for fever, malaise/fatigue and weight loss.  HENT: Negative.  Negative for nosebleeds.   Eyes: Negative.  Negative for blurred vision, double vision and photophobia.  Respiratory: Negative.  Negative for cough and shortness of breath.   Cardiovascular:  Negative.  Negative for chest pain, palpitations and leg swelling.  Gastrointestinal: Negative.  Negative for heartburn, nausea and vomiting.  Musculoskeletal:  Positive for joint pain. Negative for myalgias.  Neurological: Negative.  Negative for dizziness, focal weakness, seizures and headaches.  Psychiatric/Behavioral: Negative.  Negative for suicidal ideas.     Past Medical History:  Diagnosis Date   CAD (coronary artery disease) 03/16/2017   S/p NSTEMI 6/18 >> LHC: multivessel CAD >> s/p CABG // Echo 6/18: Mild LVH, EF 60-65, normal wall motion, grade 1 diastolic dysfunction, normal RV SF, PASP 36 (borderline pulmonary hypertension) // Pre-CABG Dopplers 09/13/16: Bilateral ICA 1-39   Echocardiogram    Echo 09/2018: EF 60-65, normal wall motion, RVSP 24.9   Family history of anesthesia complication    "never had anesthesia" (07/14/2012)   History of non-ST elevation myocardial infarction (NSTEMI) 09/12/2016   Treated with CABG   History of rectal bleeding    secondary to diverticulum affecting right hepatic flexure - 06/2012   Hypertension    Mild carotid artery disease (HCC)    a. 1-39% bilaterally preCABG in 2018.   Type II diabetes mellitus [facility])     Past Surgical History:  Procedure Laterality Date   COLONOSCOPY Left 07/14/2012   Procedure: COLONOSCOPY;  Surgeon: Evangeline Hilts, MD;  Location: [facility] ENDOSCOPY;  Service: Endoscopy;  Laterality: Left;   CORONARY ARTERY BYPASS GRAFT N/A 09/15/2016   Procedure: CORONARY ARTERY BYPASS GRAFTING (CABG) x 3, LIMA to LAD, SVG to DIAGONAL, SVG to  OM2, USING LEFT MAMMARY ARTERY AND RIGHT GREATER SAPHENOUS VEIN HARVESTED ENDOSCOPICALLY;  Surgeon: Norita Beauvais, MD;  Location: [facility] OR;  Service: Open Heart Surgery;  Laterality: N/A;   ESOPHAGOGASTRODUODENOSCOPY N/A 07/13/2012   Procedure: ESOPHAGOGASTRODUODENOSCOPY (EGD);  Surgeon: Evangeline Hilts, MD;  Location: [facility] ENDOSCOPY;  Service: Endoscopy;  Laterality: N/A;  pt in ED A8   LEFT HEART CATH  AND CORONARY ANGIOGRAPHY N/A 09/12/2016   Procedure: Left Heart Cath and Coronary Angiography;  Surgeon: Arnoldo Lapping, MD;  Location: [facility] INVASIVE CV LAB;  Service: Cardiovascular;  Laterality: N/A;   NO PAST SURGERIES     TEE WITHOUT CARDIOVERSION N/A 09/15/2016   Procedure: TRANSESOPHAGEAL ECHOCARDIOGRAM (TEE);  Surgeon: Norita Beauvais, MD;  Location: [facility] OR;  Service: Open Heart Surgery;  Laterality: N/A;    Family History  Problem Relation Age of Onset   Diabetes Mother    Diabetes Father     Social History Reviewed with no changes to be made today.   Outpatient Medications Prior to Visit  Medication Sig Dispense Refill   Accu-Chek Softclix Lancets lancets Use as instructed. Inject into the skin twice daily 100 each 3   amLODipine  (NORVASC ) 10 MG tablet Take 1 tablet (10 mg total) by mouth daily. FOR BLOOD PRESSURE 90 tablet 1   aspirin  EC 81 MG tablet Take 1 tablet (81 mg total) by mouth daily. Swallow whole. 90 tablet 3   Blood Glucose Monitoring Suppl (ACCU-CHEK GUIDE ME) w/Device KIT Use to check blood sugar 2 times daily. E11.9 1 kit 0   carvedilol  (COREG ) 6.25 MG tablet Take 1 tablet (6.25 mg total) by mouth 2 (two) times daily with a meal. 180 tablet 1   cetirizine  (ZYRTEC ) 10 MG tablet Take 1 tablet (10 mg total) by mouth daily. 30 tablet 11   Dulaglutide  (TRULICITY ) 3 MG/0.5ML SOAJ Inject 3 mg as directed once a week. 2 mL 6   fluticasone  (FLONASE ) 50 MCG/ACT nasal spray Place 2 sprays into both nostrils daily. 16 g 6   gabapentin  (NEURONTIN ) 300 MG capsule Take 3 capsules (900 mg total) by mouth 3 (three) times daily. 270 capsule 2   glucose blood (ACCU-CHEK GUIDE) test strip Use as instructed. Check blood glucose by fingerstick twice per day. (Patient not taking: Reported on 07/26/2023) 100 each 12   Insulin  Glargine (BASAGLAR  KWIKPEN) 100 UNIT/ML Inject 50 Units into the skin daily. Inject in the morning. 15 mL 2   metFORMIN  (GLUCOPHAGE ) 1000 MG tablet Take 1 tablet  (1,000 mg total) by mouth 2 (two) times daily with a meal. For DIABETES. 180 tablet 1   Misc. Devices MISC Please provide patient with an insurance approved QUAD CANE 1 each 0   nitroGLYCERIN  (NITROSTAT ) 0.4 MG SL tablet Dissolve 1 tablet under the tongue every 5 minutes as needed for chest pain. Max of 3 doses, then 911. (Patient not taking: Reported on 07/26/2023) 25 tablet 6   Olopatadine  HCl (PATADAY ) 0.2 % SOLN Apply 1 drop to both eyes daily. 2.5 mL 6   rosuvastatin  (CRESTOR ) 20 MG tablet Take 1 tablet (20 mg total) by mouth daily. STOP atorvastatin . FOR CHOLESTEROL 90 tablet 3   traZODone  (DESYREL ) 50 MG tablet Take 1-2 tablets (50-100 mg total) by mouth at bedtime. For SLEEP (Patient not taking: Reported on 07/26/2023) 180 tablet 1   triamcinolone  (KENALOG ) 0.025 % ointment Apply 1 Application topically 2 (two) times daily onto the back of the head (Patient not taking: Reported on 07/26/2023) 454 g 1  valACYclovir  (VALTREX ) 1000 MG tablet Take 1 tablet (1,000 mg total) by mouth 3 (three) times daily. (Patient not taking: Reported on 04/24/2023) 21 tablet 0   valsartan  (DIOVAN ) 160 MG tablet Take 1 tablet (160 mg total) by mouth daily. For blood pressure 90 tablet 2   acetaminophen  (TYLENOL ) 500 MG tablet Take 1 tablet (500 mg total) by mouth every 6 (six) hours as needed for moderate pain (pain score 4-6). 60 tablet 1   DULoxetine  (CYMBALTA ) 60 MG capsule Take 1 capsule (60 mg total) by mouth daily. 30 capsule 3   Facility-Administered Medications Prior to Visit  Medication Dose Route Frequency Provider Last Rate Last Admin   sodium chloride  0.9 % bolus 1,000 mL  1,000 mL Intravenous Once Dulce Gibbs M, PA-C   Stopped at 11/07/22 1730    Allergies  Allergen Reactions   Victoza  [Liraglutide ] Swelling    Lip swelling, itching, rash       Objective:    BP 108/67 (BP Location: Left Arm, Patient Position: Sitting, Cuff Size: Normal)   Pulse 65   Resp 18   Ht 5\' 5"  (1.651 m)   Wt 155 lb  (70.3 kg)   SpO2 97%   BMI 25.79 kg/m  Wt Readings from Last 3 Encounters:  08/28/23 155 lb (70.3 kg)  07/26/23 150 lb 9.6 oz (68.3 kg)  04/24/23 143 lb 3.2 oz (65 kg)    Physical Exam Vitals and nursing note reviewed.  Constitutional:      Appearance: Carl Clark is well-developed.  HENT:     Head: Normocephalic and atraumatic.  Cardiovascular:     Rate and Rhythm: Normal rate and regular rhythm.     Heart sounds: Normal heart sounds. No murmur heard.    No friction rub. No gallop.  Pulmonary:     Effort: Pulmonary effort is normal. No tachypnea or respiratory distress.     Breath sounds: Normal breath sounds. No decreased breath sounds, wheezing, rhonchi or rales.  Chest:     Chest wall: No tenderness.  Abdominal:     General: Bowel sounds are normal.     Palpations: Abdomen is soft.  Musculoskeletal:     Left shoulder: No swelling, deformity or laceration. Decreased range of motion.     Cervical back: Normal range of motion.  Skin:    General: Skin is warm and dry.  Neurological:     Mental Status: Carl Clark is alert and oriented to person, place, and time.     Coordination: Coordination normal.  Psychiatric:        Behavior: Behavior normal. Behavior is cooperative.        Thought Content: Thought content normal.        Judgment: Judgment normal.          Patient has been counseled extensively about nutrition and exercise as well as the importance of adherence with medications and regular follow-up. The patient was given clear instructions to go to ER or return to medical center if symptoms don't improve, worsen or new problems develop. The patient verbalized understanding.   Follow-up: Return in about 3 months (around 11/28/2023).   Collins Dean, FNP-BC [facility] and Outpatient Services [place], [place] [phone]   08/28/2023, 11:58 AM

## 2023-09-04 ENCOUNTER — Ambulatory Visit: Payer: Self-pay | Admitting: Nurse Practitioner

## 2023-09-12 ENCOUNTER — Ambulatory Visit: Attending: Family Medicine | Admitting: Pharmacist

## 2023-09-12 ENCOUNTER — Encounter: Payer: Self-pay | Admitting: Pharmacist

## 2023-09-12 ENCOUNTER — Other Ambulatory Visit: Payer: Self-pay

## 2023-09-12 VITALS — BP 96/59 | HR 61

## 2023-09-12 DIAGNOSIS — E1165 Type 2 diabetes mellitus with hyperglycemia: Secondary | ICD-10-CM | POA: Diagnosis not present

## 2023-09-12 DIAGNOSIS — Z7985 Long-term (current) use of injectable non-insulin antidiabetic drugs: Secondary | ICD-10-CM

## 2023-09-12 DIAGNOSIS — D649 Anemia, unspecified: Secondary | ICD-10-CM | POA: Diagnosis not present

## 2023-09-12 DIAGNOSIS — Z794 Long term (current) use of insulin: Secondary | ICD-10-CM | POA: Diagnosis not present

## 2023-09-12 DIAGNOSIS — I10 Essential (primary) hypertension: Secondary | ICD-10-CM | POA: Diagnosis not present

## 2023-09-12 DIAGNOSIS — Z7984 Long term (current) use of oral hypoglycemic drugs: Secondary | ICD-10-CM | POA: Diagnosis not present

## 2023-09-12 MED ORDER — ACCU-CHEK GUIDE ME W/DEVICE KIT
PACK | 0 refills | Status: AC
  Filled 2023-09-12: qty 1, 30d supply, fill #0

## 2023-09-12 MED ORDER — PANTOPRAZOLE SODIUM 40 MG PO TBEC
DELAYED_RELEASE_TABLET | Freq: Every day | ORAL | 11 refills | Status: DC
  Filled 2023-09-12: qty 30, 30d supply, fill #0
  Filled 2023-10-10: qty 30, 30d supply, fill #1

## 2023-09-12 MED ORDER — ACCU-CHEK SOFTCLIX LANCETS MISC
6 refills | Status: AC
  Filled 2023-09-12: qty 100, 50d supply, fill #0

## 2023-09-12 MED ORDER — ACCU-CHEK GUIDE TEST VI STRP
ORAL_STRIP | 6 refills | Status: AC
  Filled 2023-09-12: qty 100, 50d supply, fill #0
  Filled 2023-12-15: qty 100, 50d supply, fill #1

## 2023-09-12 NOTE — Progress Notes (Signed)
 S:    PCP: [name]  No chief complaint on file.  Patient arrives in good spirits.  Presents for diabetes and HTN management at the request of [name]. Patient was seen and referred on 07/26/2023. At that time, BP was 134/75 mmHg. A1c showed improvement down to 7.2 (down from 9.8 prior).  Pharmacy last saw him on 07/13/2023. I've seen him many times over my time here at [facility]. Unfortunately, our biggest barrier here is linguistic. His medication non-adherence is due to falling behind on his refills as he's not able to communicate with the pharmacy. Since his last visit with me, he has done much better with adherence. He had fills of his HTN and DM medications 08/28/2023 and he looks to be receiving these on the same day. His chief complaint today is his fatigue. He has a hx of iron deficiency noted. Last CBC 04/24/23 showed continued anemia. It was recommended he follow-up with Heme/Onc for anemia management, however, he has not done so. Of note, he did see Gastroenterology 02/02/2022. He has a hx of diverticular bleed in 2014. He was supposed to complete an endoscopy and colonoscopy but tt looks like he never did this.   Regarding his DM, he is checking his blood sugars at home seldomly. Tells me today his glucometer broke. CGM is cost-prohibitive at this time. His insurance covers but the copay is still >$100 per patient. He endorses adherence to his medications.  Lastly, he tells me he took all of his antihypertensives this morning. Denies any chest pains, dizziness, or HA. He is not checking BP at home. He received 90-ds of all antihypertensives earlier this month and endorses compliance with these.   Family/Social History:  - FHx: DM (mother, father) - Tobacco: never smoker - Alcohol: reports occasional use   Insurance coverage/medication affordability: [employer]  Current diabetes medications include:  - Basaglar  50 daily (last filled for a 30-day on 08/28/2023; pt tells me he is taking 30  units BID) - Metformin  1000 mg BID (last filled 08/28/2023 for a 90-ds) - Trulicity  3 mg weekly (last filled 08/28/2023 for a 28 day)  Current HTN medications include:  - Amlodipine  10 mg daily (last dispensed 08/28/2023 for a 90-day) - Carvedilol  6.25 mg BID (last dispensed 08/28/2023 for a 90-day) - Valsartan  160 mg daily (last dispensed 08/28/2023 for a 90-day)  Patient reports some symptoms of hypoglycemia. Tells me he experiences shaking and sweatiness. Unfortunately, he does not check blood sugar when this occurs.   Patient reported dietary habits:  - He does not limit carbohydrates  Patient-reported exercise habits:  - Does not exercise outside of work   Patient denies polyuria. Denies polydipsia.  Patient denies neuropathy. Patient denies visual changes. Patient reports self foot exams.   O:  No CGM or GM present at today's visit.  Lab Results  Component Value Date   HGBA1C 7.2 (A) 07/26/2023   Vitals:   09/12/23 0950  BP: (!) 96/59  Pulse: 61    Lipid Panel     Component Value Date/Time   CHOL 72 (L) 12/05/2022 0939   TRIG 75 12/05/2022 0939   HDL 30 (L) 12/05/2022 0939   CHOLHDL 2.4 12/05/2022 0939   CHOLHDL 4.8 09/12/2016 0806   VLDL 29 09/12/2016 0806   LDLCALC 26 12/05/2022 0939   Clinical ASCVD: Yes - CAD, NSTEMI/CABG The ASCVD Risk score (Arnett DK, et al., 2019) failed to calculate for the following reasons:   Risk score cannot be calculated because  patient has a medical history suggesting prior/existing ASCVD   A/P: Diabetes longstanding currently close to goal with A1c of 7.2%. Collaborated with pharmacy today. We are filling testing supplies for him He also requested refills of pantoprazole . His medication adherence is much better. He is asymptomatic from a hyperglycemia standpoint today. He is endorsing symptoms of hypoglycemia but I have no objective values to review. Will continue current regimen today.  -Continue current regimen.  -Extensively  discussed pathophysiology of DM, recommended lifestyle interventions, dietary effects on glycemic control -Counseled on s/sx of and management of hypoglycemia -Next A1C anticipated 10/2023  Iron deficiency anemia: previously diagnosed, not currently managed. I reviewed his chart. Saw GI ~end of 2023 but was lost to follow-up. Never completed the recommended c-scope or endoscopy. I have resubmitted a referral for GI today. I also submitted a heme referral for recommendations.  -Ambulatory referral Heme/Onc  -Ambulatory referral Gastroenterology   Results reviewed and written information provided.    Written patient instructions provided. Patient verbalized understanding of treatment plan.  Total time in face to face counseling 30 minutes.    Written patient instructions provided.  Total time in face to face counseling 30 minutes.   Follow up w/ me in 1 month.   Herlene Fleeta Morris, PharmD, JAQUELINE, CPP Clinical Pharmacist [facility] [phone]

## 2023-09-13 ENCOUNTER — Telehealth: Payer: Self-pay | Admitting: Pharmacist

## 2023-09-13 NOTE — Telephone Encounter (Signed)
 Hey friend,   This is a patient of [name]'s. I see him for diabetes. He has a hx of iron deficiency anemia and was endorsing extreme fatigue when I saw him 09/12/23. I submitted referrals to Heme/Onc and to GI. He was supposed to have a colonoscopy and an endoscopy after his visit with GI late 2023 but never completed this.   It looks like both referrals were authorized but he experiences pretty significant linguistic barriers. I don't think the offices will be able to get in touch with him. I tried to call Heme/Onc today but had to leave a VM. Same with GI except the scheduling number just rang without an answer.   Would you be able to help reach out to assist in scheduling these two follow-ups? If you are able to get him scheduled, I can call him with an interpreter to inform him of his appointment dates/times.

## 2023-09-20 ENCOUNTER — Telehealth: Payer: Self-pay | Admitting: Pharmacist

## 2023-09-20 NOTE — Telephone Encounter (Signed)
 Call placed to [facility], [phone]. Pt speaks Arabic and had a referral authorized in the setting of longstanding iron deficiency anemia. Calling on his behalf to schedule appt. VM left with scheduling with patient's name, DOB, and mrn. I requested a call back to our clinic to speak with me to schedule an appt. Patient has authorized me to schedule an appointment on his behalf.   Herlene Fleeta Morris, PharmD, JAQUELINE, CPP Clinical Pharmacist [facility] [phone]

## 2023-09-29 NOTE — Progress Notes (Unsigned)
 10/03/2023 Carl Clark [id number] 1950/10/19  Referring provider: [name] ORN, NP Primary GI doctor: Dr. [name]  ASSESSMENT AND PLAN:  Iron  deficiency anemia History of diverticular bleed in 2014 04/24/2023  HGB 11.9 MCV 95 Platelets 250 02/02/2022 Iron  113 Ferritin 14.7 B12 368 Recent Labs    10/09/22 0543 10/27/22 0825 04/24/23 1131  HGB 11.4* 11.2* 11.9*  Patient was set up for EGD colonoscopy with Dr. [name] 2023 and again in 2024 however patient no showed. Denies melena, hematochezia Carl Clark has dizziness with standing, weakness, and has had weight loss of about 5 lbs Carl Clark is not on an iron  at this time - will recheck iron , ferritin, CBC, and B12 - will start on iron  325 mg with vitamin C, if Carl Clark is not able to tolerate I can arrange for hematology referral for iron  infusions - schedule for Egd/colon to evaluate for anemia at [facility] - Risk of bowel prep, conscious sedation, and EGD and colonoscopy were discussed.  Risks include but are not limited to dehydration, pain, bleeding, cardiopulmonary process, bowel perforation, or other possible adverse outcomes.Treatment plan was discussed with patient, and agreed upon.  GERD No GERD, no nausea, no dysphagia Does get a cold every time Carl Clark eats tomatoes, possible LPR symptoms Carl Clark is on protonix  40 mg daily, continue this  CAD status post bypass Nl EF 60-65% 09/2018 No chest pain, but does have some SOB/fatigue  Type 2 diabetes Patient on trulicity  and insulin   Discussed GLP1 with the patient, mechanism of action. Gastroparesis diet given to the patient.  Patient should be instructed to hold this medications if dose falls within 7 days of endoscopic procedure, due to increased risk of retained gastric contents.  Language barrier *Due to language barrier, an interpreter was present during the history-taking and subsequent discussion (and for part of the physical exam) with this patient.    Patient Care  Team: [name] ORN, NP as PCP - General (Nurse Practitioner) Wonda Sharper, MD as PCP - Cardiology (Cardiology)  HISTORY OF PRESENT ILLNESS: 73 y.o. arabic male with a past medical history listed below presents for evaluation of IDA.   Last seen in the office 02/02/2022 by Dr. [name] for IDA and GERD.  Discussed the use of AI scribe software for clinical note transcription with the patient, who gave verbal consent to proceed.  History of Present Illness   The patient, with a history of diverticular bleeding, presents with anemia.  Carl Clark is experiencing ongoing anemia, initially noted in 2023, with chronic fatigue and dizziness upon standing. No chest pain or shortness of breath. Carl Clark denies any history of blood in the stool but mentions having black, shiny stools. Carl Clark has not been taking any iron  supplements.  Carl Clark reports a weight loss from 175 pounds to a current weight of 150 pounds. No easy bruising or bleeding. There is no family history of colon cancer.  Carl Clark has a history of frequent upper respiratory infections but denies any current cough or respiratory symptoms. Carl Clark also reports insomnia, which Carl Clark attributes to caffeine intake, and denies any leg pain or discomfort during sleep.  Carl Clark has a history of a diverticular bleed approximately seven to eight years ago, which was treated with a laser procedure. Carl Clark is currently on two diabetes medications, one administered daily and another every other week.  No abdominal pain or changes in bowel habits. Carl Clark has not experienced any food or pills getting stuck while swallowing.     Wt Readings from Last  20 Encounters:  10/03/23 150 lb (68 kg)  08/28/23 155 lb (70.3 kg)  07/26/23 150 lb 9.6 oz (68.3 kg)  04/24/23 143 lb 3.2 oz (65 kg)  02/07/23 128 lb 6.4 oz (58.2 kg)  12/05/22 142 lb (64.4 kg)  12/03/22 142 lb (64.4 kg)  11/15/22 148 lb (67.1 kg)  11/07/22 125 lb (56.7 kg)  10/27/22 134 lb 7.7 oz (61 kg)  10/09/22 135 lb (61.2 kg)  09/13/22  138 lb 6.4 oz (62.8 kg)  06/09/22 141 lb 3.2 oz (64 kg)  05/23/22 137 lb 12.8 oz (62.5 kg)  02/21/22 131 lb 3.2 oz (59.5 kg)  02/02/22 133 lb 6.4 oz (60.5 kg)  11/19/21 137 lb (62.1 kg)  11/17/21 135 lb (61.2 kg)  08/06/21 139 lb 3.2 oz (63.1 kg)  04/08/21 148 lb 6.4 oz (67.3 kg)    Carl Clark  reports that Carl Clark has never smoked. Carl Clark has never used smokeless tobacco. Carl Clark reports that Carl Clark does not drink alcohol and does not use drugs.  RELEVANT GI HISTORY, IMAGING AND LABS:  CT AP 10/2020 1. Probable mild wall thickening of jejunal small bowel loops with diffuse wall thickening of the colon, suspicious for enterocolitis. 2. Other stable findings as previously described   Colon 06/2012 )[facility]): diverticulosis -diverticular bleed   EGD 06/2012: mild gastritis    CBC    Component Value Date/Time   WBC 4.0 04/24/2023 1131   WBC 3.0 (L) 10/27/2022 0825   RBC 3.75 (L) 04/24/2023 1131   RBC 3.51 (L) 10/27/2022 0825   HGB 11.9 (L) 04/24/2023 1131   HCT 35.7 (L) 04/24/2023 1131   PLT 250 04/24/2023 1131   MCV 95 04/24/2023 1131   MCH 31.7 04/24/2023 1131   MCH 31.9 10/27/2022 0825   MCHC 33.3 04/24/2023 1131   MCHC 33.3 10/27/2022 0825   RDW 11.9 04/24/2023 1131   LYMPHSABS 1.4 04/24/2023 1131   MONOABS 0.4 10/27/2022 0825   EOSABS 0.4 04/24/2023 1131   BASOSABS 0.1 04/24/2023 1131   Recent Labs    10/09/22 0543 10/27/22 0825 04/24/23 1131  HGB 11.4* 11.2* 11.9*    CMP     Component Value Date/Time   NA 143 07/26/2023 1112   K 4.7 07/26/2023 1112   CL 108 (H) 07/26/2023 1112   CO2 23 07/26/2023 1112   GLUCOSE 131 (H) 07/26/2023 1112   GLUCOSE 318 (H) 10/27/2022 0825   BUN 14 07/26/2023 1112   CREATININE 1.17 07/26/2023 1112   CALCIUM  9.0 07/26/2023 1112   PROT 7.6 07/26/2023 1112   ALBUMIN  3.9 07/26/2023 1112   AST 23 07/26/2023 1112   ALT 33 07/26/2023 1112   ALKPHOS 56 07/26/2023 1112   BILITOT 0.4 07/26/2023 1112   GFRNONAA >60 10/27/2022 0825   GFRAA 80 01/06/2020  0930      Latest Ref Rng & Units 07/26/2023   11:12 AM 04/24/2023   11:31 AM 12/05/2022    9:39 AM  Hepatic Function  Total Protein 6.0 - 8.5 g/dL 7.6  7.7  7.9   Albumin  3.8 - 4.8 g/dL 3.9  3.9  4.3   AST 0 - 40 IU/L 23  16  19    ALT 0 - 44 IU/L 33  16  19   Alk Phosphatase 44 - 121 IU/L 56  81  91   Total Bilirubin 0.0 - 1.2 mg/dL 0.4  0.3  0.6   Bilirubin, Direct 0.00 - 0.40 mg/dL   9.80       Current  Medications:   Current Outpatient Medications (Endocrine & Metabolic):    Dulaglutide  (TRULICITY ) 3 MG/0.5ML SOAJ, Inject 3 mg as directed once a week.   Insulin  Glargine (BASAGLAR  KWIKPEN) 100 UNIT/ML, Inject 50 Units into the skin daily. Inject in the morning.   metFORMIN  (GLUCOPHAGE ) 1000 MG tablet, Take 1 tablet (1,000 mg total) by mouth 2 (two) times daily with a meal. For DIABETES.   Current Outpatient Medications (Cardiovascular):    amLODipine  (NORVASC ) 10 MG tablet, Take 1 tablet (10 mg total) by mouth daily. FOR BLOOD PRESSURE   carvedilol  (COREG ) 6.25 MG tablet, Take 1 tablet (6.25 mg total) by mouth 2 (two) times daily with a meal.   nitroGLYCERIN  (NITROSTAT ) 0.4 MG SL tablet, Dissolve 1 tablet under the tongue every 5 minutes as needed for chest pain. Max of 3 doses, then 911.   rosuvastatin  (CRESTOR ) 20 MG tablet, Take 1 tablet (20 mg total) by mouth daily. STOP atorvastatin . FOR CHOLESTEROL   valsartan  (DIOVAN ) 160 MG tablet, Take 1 tablet (160 mg total) by mouth daily. For blood pressure   Current Outpatient Medications (Respiratory):    cetirizine  (ZYRTEC ) 10 MG tablet, Take 1 tablet (10 mg total) by mouth daily.   fluticasone  (FLONASE ) 50 MCG/ACT nasal spray, Place 2 sprays into both nostrils daily.   Current Outpatient Medications (Analgesics):    aspirin  EC 81 MG tablet, Take 1 tablet (81 mg total) by mouth daily. Swallow whole.   traMADol  (ULTRAM ) 50 MG tablet, Take 1 tablet (50 mg total) by mouth every 12 (twelve) hours as needed.   Current Outpatient  Medications (Hematological):    Ferrous Sulfate  (IRON ) 325 (65 Fe) MG TABS, Take 1 tablet (325 mg total) by mouth 2 (two) times daily. Take with OJ or vitamin C   Current Outpatient Medications (Other):    Accu-Chek Softclix Lancets lancets, Use to check blood sugar 2 times daily. E11.9   Blood Glucose Monitoring Suppl (ACCU-CHEK GUIDE ME) w/Device KIT, Use to check blood sugar 2 times daily.   gabapentin  (NEURONTIN ) 300 MG capsule, Take 3 capsules (900 mg total) by mouth 3 (three) times daily.   glucose blood (ACCU-CHEK GUIDE TEST) test strip, Use to check blood sugar 2 times daily. E11.9   Misc. Devices MISC, Please provide patient with an insurance approved QUAD CANE   Olopatadine  HCl (PATADAY ) 0.2 % SOLN, Apply 1 drop to both eyes daily.   pantoprazole  (PROTONIX ) 40 MG tablet, TAKE 1 TABLET (40 MG TOTAL) BY MOUTH DAILY.   traZODone  (DESYREL ) 50 MG tablet, Take 1-2 tablets (50-100 mg total) by mouth at bedtime. For SLEEP   triamcinolone  (KENALOG ) 0.025 % ointment, Apply 1 Application topically 2 (two) times daily onto the back of the head   valACYclovir  (VALTREX ) 1000 MG tablet, Take 1 tablet (1,000 mg total) by mouth 3 (three) times daily.  Current Facility-Administered Medications (Other):    sodium chloride  0.9 % bolus 1,000 mL  Medical History:  Past Medical History:  Diagnosis Date   CAD (coronary artery disease) 03/16/2017   S/p NSTEMI 6/18 >> LHC: multivessel CAD >> s/p CABG // Echo 6/18: Mild LVH, EF 60-65, normal wall motion, grade 1 diastolic dysfunction, normal RV SF, PASP 36 (borderline pulmonary hypertension) // Pre-CABG Dopplers 09/13/16: Bilateral ICA 1-39   Echocardiogram    Echo 09/2018: EF 60-65, normal wall motion, RVSP 24.9   Family history of anesthesia complication    never had anesthesia (07/14/2012)   History of non-ST elevation myocardial infarction (NSTEMI) 09/12/2016   Treated  with CABG   History of rectal bleeding    secondary to diverticulum affecting  right hepatic flexure - 06/2012   Hypertension    Mild carotid artery disease (HCC)    a. 1-39% bilaterally preCABG in 2018.   Type II diabetes mellitus (HCC)    Allergies:  Allergies  Allergen Reactions   Victoza  [Liraglutide ] Swelling    Lip swelling, itching, rash     Surgical History:  Carl Clark  has a past surgical history that includes No past surgeries; Esophagogastroduodenoscopy (N/A, 07/13/2012); Colonoscopy (Left, 07/14/2012); LEFT HEART CATH AND CORONARY ANGIOGRAPHY (N/A, 09/12/2016); Coronary artery bypass graft (N/A, 09/15/2016); and TEE without cardioversion (N/A, 09/15/2016). Family History:  His family history includes Diabetes in his father and mother.  REVIEW OF SYSTEMS  : All other systems reviewed and negative except where noted in the History of Present Illness.  PHYSICAL EXAM: BP (!) 122/50   Pulse 63   Ht 5' 5 (1.651 m)   Wt 150 lb (68 kg)   BMI 24.96 kg/m  Physical Exam   GENERAL APPEARANCE: Well nourished, in no apparent distress HEENT: No cervical lymphadenopathy, unremarkable thyroid, sclerae anicteric, conjunctiva pink RESPIRATORY: Respiratory effort normal, BS equal bilateral without rales, rhonchi, wheezing CARDIO: RRR with no MRGs, peripheral pulses intact ABDOMEN: Soft, non distended, active bowel sounds in all 4 quadrants, no tenderness to palpation, no rebound, no mass appreciated RECTAL: declines MUSCULOSKELETAL: Full ROM, normal gait, without edema SKIN: Dry, intact without rashes or lesions. No jaundice. NEURO: Alert, oriented, no focal deficits PSYCH: Cooperative, normal mood and affect.      Alan JONELLE Coombs, PA-C 10:58 AM

## 2023-10-03 ENCOUNTER — Ambulatory Visit (INDEPENDENT_AMBULATORY_CARE_PROVIDER_SITE_OTHER): Admitting: Physician Assistant

## 2023-10-03 ENCOUNTER — Ambulatory Visit: Payer: Self-pay | Admitting: Physician Assistant

## 2023-10-03 ENCOUNTER — Other Ambulatory Visit (INDEPENDENT_AMBULATORY_CARE_PROVIDER_SITE_OTHER)

## 2023-10-03 ENCOUNTER — Other Ambulatory Visit: Payer: Self-pay

## 2023-10-03 ENCOUNTER — Encounter: Payer: Self-pay | Admitting: Physician Assistant

## 2023-10-03 VITALS — BP 122/50 | HR 63 | Ht 65.0 in | Wt 150.0 lb

## 2023-10-03 DIAGNOSIS — I2510 Atherosclerotic heart disease of native coronary artery without angina pectoris: Secondary | ICD-10-CM | POA: Diagnosis not present

## 2023-10-03 DIAGNOSIS — D509 Iron deficiency anemia, unspecified: Secondary | ICD-10-CM

## 2023-10-03 DIAGNOSIS — Z794 Long term (current) use of insulin: Secondary | ICD-10-CM | POA: Diagnosis not present

## 2023-10-03 DIAGNOSIS — E119 Type 2 diabetes mellitus without complications: Secondary | ICD-10-CM

## 2023-10-03 DIAGNOSIS — Z951 Presence of aortocoronary bypass graft: Secondary | ICD-10-CM

## 2023-10-03 DIAGNOSIS — Z7985 Long-term (current) use of injectable non-insulin antidiabetic drugs: Secondary | ICD-10-CM

## 2023-10-03 DIAGNOSIS — I779 Disorder of arteries and arterioles, unspecified: Secondary | ICD-10-CM

## 2023-10-03 DIAGNOSIS — E1122 Type 2 diabetes mellitus with diabetic chronic kidney disease: Secondary | ICD-10-CM

## 2023-10-03 LAB — CBC WITH DIFFERENTIAL/PLATELET
Basophils Absolute: 0.1 K/uL (ref 0.0–0.1)
Basophils Relative: 1.1 % (ref 0.0–3.0)
Eosinophils Absolute: 0.6 K/uL (ref 0.0–0.7)
Eosinophils Relative: 12.4 % — ABNORMAL HIGH (ref 0.0–5.0)
HCT: 35.3 % — ABNORMAL LOW (ref 39.0–52.0)
Hemoglobin: 11.9 g/dL — ABNORMAL LOW (ref 13.0–17.0)
Lymphocytes Relative: 23.5 % (ref 12.0–46.0)
Lymphs Abs: 1.2 K/uL (ref 0.7–4.0)
MCHC: 33.7 g/dL (ref 30.0–36.0)
MCV: 95 fl (ref 78.0–100.0)
Monocytes Absolute: 0.4 K/uL (ref 0.1–1.0)
Monocytes Relative: 7.1 % (ref 3.0–12.0)
Neutro Abs: 2.9 K/uL (ref 1.4–7.7)
Neutrophils Relative %: 55.9 % (ref 43.0–77.0)
Platelets: 258 K/uL (ref 150.0–400.0)
RBC: 3.71 Mil/uL — ABNORMAL LOW (ref 4.22–5.81)
RDW: 13.2 % (ref 11.5–15.5)
WBC: 5.1 K/uL (ref 4.0–10.5)

## 2023-10-03 LAB — COMPREHENSIVE METABOLIC PANEL WITH GFR
ALT: 21 U/L (ref 0–53)
AST: 19 U/L (ref 0–37)
Albumin: 4.1 g/dL (ref 3.5–5.2)
Alkaline Phosphatase: 50 U/L (ref 39–117)
BUN: 24 mg/dL — ABNORMAL HIGH (ref 6–23)
CO2: 29 meq/L (ref 19–32)
Calcium: 9.1 mg/dL (ref 8.4–10.5)
Chloride: 104 meq/L (ref 96–112)
Creatinine, Ser: 1.18 mg/dL (ref 0.40–1.50)
GFR: 61.64 mL/min (ref >60.00)
Glucose, Bld: 239 mg/dL — ABNORMAL HIGH (ref 70–99)
Potassium: 4.2 meq/L (ref 3.5–5.1)
Sodium: 137 meq/L (ref 135–145)
Total Bilirubin: 0.4 mg/dL (ref 0.2–1.2)
Total Protein: 8.2 g/dL (ref 6.0–8.3)

## 2023-10-03 LAB — IBC + FERRITIN
Ferritin: 14.1 ng/mL — ABNORMAL LOW (ref 22.0–322.0)
Iron: 131 ug/dL (ref 42–165)
Saturation Ratios: 39.3 % (ref 20.0–50.0)
TIBC: 333.2 ug/dL (ref 250.0–450.0)
Transferrin: 238 mg/dL (ref 212.0–360.0)

## 2023-10-03 LAB — VITAMIN B12: Vitamin B-12: 447 pg/mL (ref 211–911)

## 2023-10-03 MED ORDER — IRON 325 (65 FE) MG PO TABS
325.0000 mg | ORAL_TABLET | Freq: Two times a day (BID) | ORAL | 1 refills | Status: AC
  Filled 2023-10-03: qty 60, 30d supply, fill #0
  Filled 2024-01-15: qty 60, 30d supply, fill #1

## 2023-10-03 MED ORDER — NA SULFATE-K SULFATE-MG SULF 17.5-3.13-1.6 GM/177ML PO SOLN
1.0000 | Freq: Once | ORAL | 0 refills | Status: AC
  Filled 2023-10-03: qty 354, 1d supply, fill #0

## 2023-10-03 NOTE — Patient Instructions (Addendum)
 ?????? ?????? ??? ?????? ?????? ?????? ??? ???????? ??? ??????? ?????. ???? ??? ?? B ?? ??????. ??? ??????? ??? ??? ??? ??? ?????? ??? ????? ?? ??????. talab mink muqadam alrieayat alsihiyat altawajuh 'iilaa altaabiq alsuflii li'iijra' baed altahalil qabl mughadaratik alyawma. adghat ealaa zirin B fi almiseada. yaqae almukhtabar eind 'awal bab ealaa alyasar eind khurujik min almisead.   ????? ????? 325 ??? ?? ?????? ??????? ?? 500 ??? ?? ??????? ?? ?? ???? ?????? ?????? ????????. ??? ?? ????? ????? ?????? ???? ??? ?? ?????/?????/?????? ??????? ???????? ?? ??? ??. ?? ?????? ?????? ????? ??? ????? ??????. marid yueani min naqs alhadidi, yurja 'iidafat 325 mulaghin min alhadid ywmyan, mae 500 mulaghin min fitamin si 'aw easir burtuqal liziadat alaimtisasi. 'iidha lam tastatie thmml alhadid bisabab 'alam fi albatini/'iimsaki/ghithiani, fa'akhbirana wsnujhhz lak naql dam. qad yuhwwl alhadid brazak 'iilaa allawn aldaakni.  ??? ???? Anemia  ??? ???? ?????? ???? ????? ?? ?????? ???? ????? ???? ??????? ?? ???????????? ??? ???????? ???. ????????????? ???? ?? ????? ???? ??????? ???? ?????????. ?????? ???? ???? ????? ???? ??????? ?? ???????????? ?? ???? ??? ????? (?? ????? ???? ?????? ???? ????)? ?? ?????? ???? ?????? ??? ?? ???? ?? ???????? ????? ???? ?? ???? ??? ??? ??? ?????? ??? ???. ?????? ????? ??? ???? ????? ????? ?? ????? ????? ????. ?? ????? ??? ??????? ???? ??????? ??????? ???? ???? ?? ???:  ?????? ??????. ???? ?? ???? ??? ???? ???? ?????? ???????? ?? ???????? ??? ??? ???? ??????? ?? ?? ????? ????? ??????? ??? ??????.  ??? ???????.  ??????? ????? ???? ????? (????) ?????? ?? ????? ??????? ?? ?????.  ???????? ??? ????? ??????? ?????? ????????? ????.  ??? ??????? ??????? ??? ???????.  ??????? ?????? ??? ??????? ?????? (HIV) ???????? ??? ??????? ???????? (??????).  ?????? ???????? ????????? ??????? ??????? ???? ????? ??? ????? ???? ???????. ?? ?????? ??? ?????? ?? ???????? ???? ????? ??? ?????? ??  ???:  ??? ????.  ??????.  ???? ?? ?????? ?? ??????? ??????.  ?????? ???? ??? ??? ????? ?? ???? ?? ??????? (?????).  ??? ?? ??????? ?????? ??? ?????? ???????.  ???? ?????? ???????? ???????? ?? ????? ?????? ????????.  ?????? ?????? (??? ?????) ????????. ?? ???? ??????? ???? ?? ???? ????? ????. ??? ??? ??? ???? ???? ???????? ??? ?? ???? ?????? ???????. ??? ????? ??? ??????? ????? ??? ?????? ????????? ??? ???? ????? ??????? ?????? ?????? ??????. ?? ??? ???????? ?? ???? ??? ???? ??? ????? ??? ????? ??? ????? ??????? ?? ?????? ????? ???? ????? ?????? ??? ?????? (???? ??? ?????). ???? ?? ???? ???? ??????? ?????? ??? ?????? (??????) ????? ?? ???? ?? ???? ??? ???? ?????? ???? ?????? ??? ????? ???? ????? ??. ??? ???? ?????? ?????? ?? ???:  ???? ??????? ??? ?????? ???????? ??????????? ?? ?????????? ?????????? ?????????????.  ????? ??? ????? ???? ?????? ??????? (????? ?????). ??????? ?? ????? ???? ???? ?????? ?? ???? ??? ??????.  ????? ??? ????? ???? ??????? ????????? (?????? ???????). ??? ?????? ??? ??????? ????? ???? ??? ?????? ??? ?????. ??? ????? ??? ?????? ?? ?????? ??? ????? ??? ?????? ???? ??????. ????? ???? ??????:  ????? ?????? ?????? ?? ??????? ?12 ?? ??? ???????.  ????? ???? ?????? (????????????) ???? ?? ????? ?? ????? ??? ????? ???? ???????.  ?????? ??? ???? ?? ????? ??? ?????? (??? ????)? ?? ????? ??? ??? ??? ???? ?????? ?? ????.  ????? ????? ???????.  ????? ????? ???????? ??????. ???? ????????? ??????? ?? ??????:  ????? ??????? ???? ????? ????? ???? ?? ??? ???? ???? ??? ?? ????? ?? ???? ??????? ??????.  ?? ?????? ???????? ???????? ??? ??? ??????? ????? ??????? ?????? ??.  ???? ???? ??????? ?????? ??????? ???? ????? ?? ???? ??????? ??????.  ????? ????? ?????? ????????. ????? ???? ??????? ?????? ?? ????? ?????? ???? ???????? ??? ???????. ???? ?????? ??????? ?????? ?? ??????? ???????:  ???? ???? ???? ?? ???? ?? ????.  ?????? ???? ????. ???? ????????? ????? ?? ??????? ???????:  ?????  ???? ??????.  ?????? ???? ?? ????? ?? ?????.  ?????? ????? ?? ?????.  ???? ????? ?? ???????.  ???? ?????? ???? ????? ?????? ?? ??????? ??? ???.  ?????? ???? ????? ?? ???? ??. ??? ???? ??? ??????? ????? ??? ???? ???? ?????. ???? ???????? ?????. ???? ????  911.  ?? ????? ?? ??? ???? ??????? ????? ?? ??.   ?? ??? ??????? ????? ??? ????????. ????  ??? ???? ????? ???? ??? ???? ???? ????? ???? ??????? ?? ?????? ???? ???? ???????? ?? ??? ???????.  ?? ???? ??????? ???? ?? ???? ????? ????.  ??? ??? ??? ???? ???? ???????? ??? ?? ???? ?????? ???????.  ???? ????? ??? ?????? ?????? ???? ??????? ?????? ?????? ?????? ????? ?????? ????? ????? ???????. ??? ????? ????? ????? ???? ????.  ????? ???? ??? ?????? ??? ??? ??? ????. ??? ????? ?? ??? ????????? ?? ???? ?????? ????????? ???? ?????? ???? ??????? ??????. ???? ?? ?????? ??? ????? ???? ?? ???? ?? ???? ??????? ??????.? Document Revised: 06/30/2021 Document Reviewed: 06/30/2021 Elsevier Patient Education  2024 [employer].  Due to recent changes in healthcare laws, you may see the results of your imaging and laboratory studies on MyChart before your provider has had a chance to review them.  We understand that in some cases there may be results that are confusing or concerning to you. Not all laboratory results come back in the same time frame and the provider may be waiting for multiple results in order to interpret others.  Please give us  48 hours in order for your provider to thoroughly review all the results before contacting the office for clarification of your results.    I appreciate the  opportunity to care for you  Thank You   [facility]

## 2023-10-10 ENCOUNTER — Other Ambulatory Visit: Payer: Self-pay

## 2023-10-24 ENCOUNTER — Inpatient Hospital Stay

## 2023-10-24 ENCOUNTER — Inpatient Hospital Stay: Admitting: Internal Medicine

## 2023-10-27 ENCOUNTER — Encounter (HOSPITAL_COMMUNITY): Payer: Self-pay

## 2023-10-27 ENCOUNTER — Emergency Department (HOSPITAL_COMMUNITY)

## 2023-10-27 ENCOUNTER — Emergency Department (HOSPITAL_COMMUNITY)
Admission: EM | Admit: 2023-10-27 | Discharge: 2023-10-27 | Disposition: A | Discharge status: Home / Self Care | Attending: Emergency Medicine | Admitting: Emergency Medicine

## 2023-10-27 ENCOUNTER — Other Ambulatory Visit: Payer: Self-pay

## 2023-10-27 DIAGNOSIS — Z7984 Long term (current) use of oral hypoglycemic drugs: Secondary | ICD-10-CM | POA: Insufficient documentation

## 2023-10-27 DIAGNOSIS — Z794 Long term (current) use of insulin: Secondary | ICD-10-CM | POA: Insufficient documentation

## 2023-10-27 DIAGNOSIS — I2510 Atherosclerotic heart disease of native coronary artery without angina pectoris: Secondary | ICD-10-CM | POA: Diagnosis not present

## 2023-10-27 DIAGNOSIS — R059 Cough, unspecified: Secondary | ICD-10-CM | POA: Diagnosis present

## 2023-10-27 DIAGNOSIS — R251 Tremor, unspecified: Secondary | ICD-10-CM | POA: Insufficient documentation

## 2023-10-27 DIAGNOSIS — Z7982 Long term (current) use of aspirin: Secondary | ICD-10-CM | POA: Insufficient documentation

## 2023-10-27 DIAGNOSIS — E119 Type 2 diabetes mellitus without complications: Secondary | ICD-10-CM | POA: Insufficient documentation

## 2023-10-27 DIAGNOSIS — R051 Acute cough: Secondary | ICD-10-CM | POA: Insufficient documentation

## 2023-10-27 DIAGNOSIS — J029 Acute pharyngitis, unspecified: Secondary | ICD-10-CM | POA: Diagnosis not present

## 2023-10-27 LAB — CBC WITH DIFFERENTIAL/PLATELET
Abs Immature Granulocytes: 0.01 K/uL (ref 0.00–0.07)
Basophils Absolute: 0.1 K/uL (ref 0.0–0.1)
Basophils Relative: 1 %
Eosinophils Absolute: 0.2 K/uL (ref 0.0–0.5)
Eosinophils Relative: 4 %
HCT: 39.2 % (ref 39.0–52.0)
Hemoglobin: 12.7 g/dL — ABNORMAL LOW (ref 13.0–17.0)
Immature Granulocytes: 0 %
Lymphocytes Relative: 16 %
Lymphs Abs: 0.7 K/uL (ref 0.7–4.0)
MCH: 32.3 pg (ref 26.0–34.0)
MCHC: 32.4 g/dL (ref 30.0–36.0)
MCV: 99.7 fL (ref 80.0–100.0)
Monocytes Absolute: 0.3 K/uL (ref 0.1–1.0)
Monocytes Relative: 7 %
Neutro Abs: 3 K/uL (ref 1.7–7.7)
Neutrophils Relative %: 72 %
Platelets: 276 K/uL (ref 150–400)
RBC: 3.93 MIL/uL — ABNORMAL LOW (ref 4.22–5.81)
RDW: 11.9 % (ref 11.5–15.5)
WBC: 4.2 K/uL (ref 4.0–10.5)
nRBC: 0 % (ref 0.0–0.2)

## 2023-10-27 LAB — COMPREHENSIVE METABOLIC PANEL WITH GFR
ALT: 23 U/L (ref 0–44)
AST: 26 U/L (ref 15–41)
Albumin: 3.6 g/dL (ref 3.5–5.0)
Alkaline Phosphatase: 47 U/L (ref 38–126)
Anion gap: 11 (ref 5–15)
BUN: 27 mg/dL — ABNORMAL HIGH (ref 8–23)
CO2: 21 mmol/L — ABNORMAL LOW (ref 22–32)
Calcium: 9.3 mg/dL (ref 8.9–10.3)
Chloride: 107 mmol/L (ref 98–111)
Creatinine, Ser: 1.21 mg/dL (ref 0.61–1.24)
GFR, Estimated: >60 mL/min (ref >60)
Glucose, Bld: 217 mg/dL — ABNORMAL HIGH (ref 70–99)
Potassium: 4.7 mmol/L (ref 3.5–5.1)
Sodium: 139 mmol/L (ref 135–145)
Total Bilirubin: 0.7 mg/dL (ref 0.0–1.2)
Total Protein: 8 g/dL (ref 6.5–8.1)

## 2023-10-27 LAB — MAGNESIUM: Magnesium: 1.8 mg/dL (ref 1.7–2.4)

## 2023-10-27 LAB — URINALYSIS, ROUTINE W REFLEX MICROSCOPIC
Bilirubin Urine: NEGATIVE
Glucose, UA: >=500 mg/dL — AB
Hgb urine dipstick: NEGATIVE
Ketones, ur: NEGATIVE mg/dL
Leukocytes,Ua: NEGATIVE
Nitrite: NEGATIVE
Protein, ur: NEGATIVE mg/dL
Specific Gravity, Urine: 1.018 (ref 1.005–1.030)
pH: 5 (ref 5.0–8.0)

## 2023-10-27 LAB — RESP PANEL BY RT-PCR (RSV, FLU A&B, COVID)  RVPGX2
Influenza A by PCR: NEGATIVE
Influenza B by PCR: NEGATIVE
Resp Syncytial Virus by PCR: NEGATIVE
SARS Coronavirus 2 by RT PCR: NEGATIVE

## 2023-10-27 LAB — GROUP A STREP BY PCR: Group A Strep by PCR: NOT DETECTED

## 2023-10-27 LAB — CBG MONITORING, ED: Glucose-Capillary: 157 mg/dL — ABNORMAL HIGH (ref 70–99)

## 2023-10-27 MED ORDER — BENZONATATE 100 MG PO CAPS
100.0000 mg | ORAL_CAPSULE | Freq: Three times a day (TID) | ORAL | 0 refills | Status: DC
  Filled 2023-10-27 – 2023-10-30 (×2): qty 9, 3d supply, fill #0

## 2023-10-27 NOTE — ED Notes (Signed)
 Patient transported to X-ray

## 2023-10-27 NOTE — Discharge Instructions (Signed)
 Thank you for allowing us  to care for you today.  I have sent you a prescription for cough medicine that you may pick up on Monday.  Please return to the emergency department if you experience worsening symptoms, chest pain, shortness of breath, passout or believe you need emergent medical care

## 2023-10-27 NOTE — ED Provider Notes (Signed)
 [place] [facility] Provider Note   CSN: [id number] Arrival date & time: 10/27/23  1255     Patient presents with: Tremors in Hands   Carl Clark is a 73 y.o. male past medical history significant for diverticulosis followed by GI in the outpatient setting, insulin -dependent type 2 diabetes, CAD, GERD who presents emergency department for sore throat and cough.  Patient states he has had a sore throat and cough for approximately 2 to 3 days.  Patient denies any other associated symptoms including chest pain, shortness of breath, nausea, vomiting, abdominal pain, emesis, fever.  Patient is requesting to go home soon as possible as he has barely members waiting for him at home.  Of note, patient did mention tremors in his bilateral hands upon triage however patient did not endorse the symptoms during my initial evaluation.   HPI     Prior to Admission medications   Medication Sig Start Date End Date Taking? Authorizing Provider  benzonatate  (TESSALON ) 100 MG capsule Take 1 capsule (100 mg total) by mouth every 8 (eight) hours for 3 days. 10/27/23 10/30/23 Yes Nada Chroman, DO  Accu-Chek Softclix Lancets lancets Use to check blood sugar 2 times daily. E11.9 09/12/23   Newlin, Enobong, MD  amLODipine  (NORVASC ) 10 MG tablet Take 1 tablet (10 mg total) by mouth daily. FOR BLOOD PRESSURE 04/25/23   [name] ORN, NP  aspirin  EC 81 MG tablet Take 1 tablet (81 mg total) by mouth daily. Swallow whole. 11/17/21   Wonda Sharper, MD  Blood Glucose Monitoring Suppl (ACCU-CHEK GUIDE ME) w/Device KIT Use to check blood sugar 2 times daily. 09/12/23   Newlin, Enobong, MD  carvedilol  (COREG ) 6.25 MG tablet Take 1 tablet (6.25 mg total) by mouth 2 (two) times daily with a meal. 07/26/23   [name] ORN, NP  cetirizine  (ZYRTEC ) 10 MG tablet Take 1 tablet (10 mg total) by mouth daily. 06/06/23   Newlin, Enobong, MD  Dulaglutide  (TRULICITY ) 3 MG/0.5ML SOAJ  Inject 3 mg as directed once a week. 04/24/23   Fleming, [name] W, NP  Ferrous Sulfate  (IRON ) 325 (65 Fe) MG TABS Take 1 tablet (325 mg total) by mouth 2 (two) times daily. Take with OJ or vitamin C 10/03/23   Collier, Amanda R, PA-C  fluticasone  (FLONASE ) 50 MCG/ACT nasal spray Place 2 sprays into both nostrils daily. 06/06/23   Newlin, Enobong, MD  gabapentin  (NEURONTIN ) 300 MG capsule Take 3 capsules (900 mg total) by mouth 3 (three) times daily. 02/07/23   [name] ORN, NP  glucose blood (ACCU-CHEK GUIDE TEST) test strip Use to check blood sugar 2 times daily. E11.9 09/12/23   Newlin, Enobong, MD  Insulin  Glargine (BASAGLAR  KWIKPEN) 100 UNIT/ML Inject 50 Units into the skin daily. Inject in the morning. 07/26/23   Fleming, [name] W, NP  metFORMIN  (GLUCOPHAGE ) 1000 MG tablet Take 1 tablet (1,000 mg total) by mouth 2 (two) times daily with a meal. For DIABETES. 02/07/23   Delbert Clam, MD  Misc. Devices MISC Please provide patient with an insurance approved QUAD CANE 08/10/17   Fleming, [name] W, NP  nitroGLYCERIN  (NITROSTAT ) 0.4 MG SL tablet Dissolve 1 tablet under the tongue every 5 minutes as needed for chest pain. Max of 3 doses, then 911. 11/17/21   Cooper, Michael, MD  Olopatadine  HCl (PATADAY ) 0.2 % SOLN Apply 1 drop to both eyes daily. 07/26/23   Fleming, [name] W, NP  pantoprazole  (PROTONIX ) 40 MG tablet TAKE 1 TABLET (  40 MG TOTAL) BY MOUTH DAILY. 09/12/23 09/11/24  Newlin, Enobong, MD  rosuvastatin  (CRESTOR ) 20 MG tablet Take 1 tablet (20 mg total) by mouth daily. STOP atorvastatin . FOR CHOLESTEROL 04/25/23   Fleming, [name] W, NP  traMADol  (ULTRAM ) 50 MG tablet Take 1 tablet (50 mg total) by mouth every 12 (twelve) hours as needed. 08/28/23   Fleming, [name] W, NP  traZODone  (DESYREL ) 50 MG tablet Take 1-2 tablets (50-100 mg total) by mouth at bedtime. For SLEEP 05/23/22   Fleming, [name] W, NP  triamcinolone  (KENALOG ) 0.025 % ointment Apply 1 Application topically 2 (two) times daily onto the back of the  head 02/21/22   [name] ORN, NP  valACYclovir  (VALTREX ) 1000 MG tablet Take 1 tablet (1,000 mg total) by mouth 3 (three) times daily. 10/27/22   Nivia Colon, PA-C  valsartan  (DIOVAN ) 160 MG tablet Take 1 tablet (160 mg total) by mouth daily. For blood pressure 02/08/23   Wonda Sharper, MD    Allergies: Victoza  [liraglutide ]    Review of Systems  Updated Vital Signs BP 138/60 (BP Location: Left Arm)   Pulse 60   Temp 98.1 F (36.7 C) (Oral)   Resp 14   SpO2 100%   Physical Exam Vitals and nursing note reviewed.  Constitutional:      General: He is not in acute distress.    Appearance: He is not ill-appearing.  HENT:     Head: Normocephalic and atraumatic.     Mouth/Throat:     Pharynx: Oropharynx is clear. No pharyngeal swelling, oropharyngeal exudate, posterior oropharyngeal erythema, uvula swelling or postnasal drip.     Tonsils: No tonsillar exudate or tonsillar abscesses.     Comments: Oropharyngeal exam benign without evidence of erythema, edema, exudate, mass, abscess or purulent drainage Eyes:     Pupils: Pupils are equal, round, and reactive to light.  Neck:     Comments: Anterior cervical lymphadenopathy Pulmonary:     Effort: Pulmonary effort is normal. No tachypnea.     Breath sounds: Normal breath sounds.  Abdominal:     General: There is no distension.     Palpations: Abdomen is soft.     Tenderness: There is no abdominal tenderness.  Musculoskeletal:     Comments: Spontaneous movement bilateral upper and lower extremities  Lymphadenopathy:     Cervical: Cervical adenopathy present.  Neurological:     Mental Status: He is alert.     Motor: No tremor.  Psychiatric:        Behavior: Behavior is cooperative.     (all labs ordered are listed, but only abnormal results are displayed) Labs Reviewed  CBC WITH DIFFERENTIAL/PLATELET - Abnormal; Notable for the following components:      Result Value   RBC 3.93 (*)    Hemoglobin 12.7 (*)    All other  components within normal limits  COMPREHENSIVE METABOLIC PANEL WITH GFR - Abnormal; Notable for the following components:   CO2 21 (*)    Glucose, Bld 217 (*)    BUN 27 (*)    All other components within normal limits  URINALYSIS, ROUTINE W REFLEX MICROSCOPIC - Abnormal; Notable for the following components:   APPearance HAZY (*)    Glucose, UA >=500 (*)    Bacteria, UA RARE (*)    All other components within normal limits  CBG MONITORING, ED - Abnormal; Notable for the following components:   Glucose-Capillary 157 (*)    All other components within normal limits  RESP PANEL BY  RT-PCR (RSV, FLU A&B, COVID)  [id number]  GROUP A STREP BY PCR  MAGNESIUM     EKG: None  Radiology: DG Chest 2 View Result Date: 10/27/2023 CLINICAL DATA:  Cough. EXAM: CHEST - 2 VIEW COMPARISON:  10/09/2022. FINDINGS: The heart size and mediastinal contours are within normal limits. Prior median sternotomy and CABG. No focal consolidation, pleural effusion, or pneumothorax. No acute osseous abnormality. IMPRESSION: No acute cardiopulmonary findings. Electronically Signed   By: Harrietta Sherry M.D.   On: 10/27/2023 13:57     Procedures   Medications Ordered in the ED - No data to display                                  Medical Decision Making Risk Prescription drug management.   On initial valuation patient is hemodynamically stable, afebrile and not in acute distress.  Based on patient's history differential diagnosis include viral versus bacterial pharyngitis, upper respiratory viral illness, PTA, Ludwig's angina, stridor, foreign body, pneumothorax, pneumonia, acute pulmonary edema.  Laboratory studies without evidence of anemia, infection, electrolyte abnormality, renal impairment, hypoglycemia or hyperglycemia, bacterial pharyngitis.  Urinalysis without evidence of infection or hematuria.  Chest x-ray reviewed without evidence of pneumothorax, pneumonia, pleural effusion or pulmonary edema.  Please  find patient's history, physical examination findings and laboratory studies patient's symptoms are secondary to upper respiratory viral illness and viral pharyngitis.  Do not believe patient has PTA, Ludwick's angina, stridor based on patient's physical examination findings as well as hemodynamic stability and no leukocytosis on laboratory studies.  At this time believe patient is safe to be discharged home with Tessalon  Perles for cough.  Patient was given strict return precautions and recommendations to follow-up with his primary care provider.  Patient had no further questions at time of discharge.     Final diagnoses:  Acute cough  Sore throat    ED Discharge Orders          Ordered    benzonatate  (TESSALON ) 100 MG capsule  Every 8 hours        10/27/23 1926           Lavanda Bolster DO Emergency Medicine PGY2     Bolster Lavanda, DO 10/27/23 2222    Cottie Donnice PARAS, MD 10/28/23 [phone]

## 2023-10-27 NOTE — ED Notes (Signed)
 OK to eat and drink per Dr. Cottie. RN provided pt with a sandwich, applesauce, and water.

## 2023-10-27 NOTE — ED Notes (Signed)
 checked pt's cbg as per pt request. Pt said that he is diabetic, has been sitting in the lobby and needs his blood sugar checked

## 2023-10-27 NOTE — ED Provider Triage Note (Signed)
 Emergency Medicine Provider Triage Evaluation Note  Travion Ke , a 73 y.o. male  was evaluated in triage.  Pt complains of productive cough, sore throat for past four days. This morning had tremors in bilateral hands and teeth chattering . Denies feeling cold, fevers, chills, CP, SHOB, numbness, weakness, NVD  Review of Systems  Positive: hpi Negative:   Physical Exam  BP (!) 118/44 (BP Location: Right Arm)   Pulse 67   Temp 97.8 F (36.6 C)   Resp 16   SpO2 100%  Gen:   Awake, no distress   Resp:  Normal effort  MSK:   Moves extremities without difficulty  Other:  Speaking in full complete sentences without difficulty.  No slurred speech nor aphasia.  No pronator drift.  Grip strength equal bilaterally.  Station 2/2 of BUE and BLE.  Medical Decision Making  Medically screening exam initiated at 1:18 PM.  Appropriate orders placed.  [name] was informed that the remainder of the evaluation will be completed by another provider, this initial triage assessment does not replace that evaluation, and the importance of remaining in the ED until their evaluation is complete.  No fever nor tachycardia in triage. No tremors during MSE. Neuro intact  Labs, CXR ordered   Minnie Tinnie BRAVO, [place] 10/27/23 1322

## 2023-10-27 NOTE — ED Triage Notes (Addendum)
 Pt BIB [facility] from home d/t feeling tremors in his hands 1 hr prior to calling EMS. Pt reports that was was feeling cold & his hands were cool. EMS reports he was did have a cough present en route & reports it has been there for 4 days plus as sore throat. A/Ox4, 110/52, 64 bpm, 99% on RA, LS clear, 12L good.

## 2023-10-28 ENCOUNTER — Other Ambulatory Visit (HOSPITAL_COMMUNITY): Payer: Self-pay

## 2023-10-30 ENCOUNTER — Other Ambulatory Visit (HOSPITAL_COMMUNITY): Payer: Self-pay

## 2023-10-30 ENCOUNTER — Other Ambulatory Visit: Payer: Self-pay

## 2023-11-10 ENCOUNTER — Telehealth: Payer: Self-pay | Admitting: Nurse Practitioner

## 2023-11-10 NOTE — Telephone Encounter (Signed)
 Confirmed appt for 8/25

## 2023-11-13 ENCOUNTER — Telehealth: Payer: Self-pay | Admitting: Pharmacist

## 2023-11-13 ENCOUNTER — Encounter: Payer: Self-pay | Admitting: Pharmacist

## 2023-11-13 ENCOUNTER — Other Ambulatory Visit: Payer: Self-pay | Admitting: Nurse Practitioner

## 2023-11-13 ENCOUNTER — Ambulatory Visit: Attending: Nurse Practitioner | Admitting: Pharmacist

## 2023-11-13 ENCOUNTER — Other Ambulatory Visit: Payer: Self-pay

## 2023-11-13 DIAGNOSIS — Z794 Long term (current) use of insulin: Secondary | ICD-10-CM

## 2023-11-13 DIAGNOSIS — Z7984 Long term (current) use of oral hypoglycemic drugs: Secondary | ICD-10-CM

## 2023-11-13 DIAGNOSIS — Z7985 Long-term (current) use of injectable non-insulin antidiabetic drugs: Secondary | ICD-10-CM | POA: Diagnosis not present

## 2023-11-13 DIAGNOSIS — I25119 Atherosclerotic heart disease of native coronary artery with unspecified angina pectoris: Secondary | ICD-10-CM

## 2023-11-13 DIAGNOSIS — E1165 Type 2 diabetes mellitus with hyperglycemia: Secondary | ICD-10-CM

## 2023-11-13 DIAGNOSIS — I10 Essential (primary) hypertension: Secondary | ICD-10-CM

## 2023-11-13 LAB — POCT GLYCOSYLATED HEMOGLOBIN (HGB A1C): HbA1c, POC (controlled diabetic range): 6.9 % (ref 0.0–7.0)

## 2023-11-13 MED ORDER — BENZONATATE 100 MG PO CAPS
100.0000 mg | ORAL_CAPSULE | Freq: Three times a day (TID) | ORAL | 0 refills | Status: AC
  Filled 2023-11-13: qty 9, 3d supply, fill #0

## 2023-11-13 MED ORDER — AMLODIPINE BESYLATE 10 MG PO TABS
10.0000 mg | ORAL_TABLET | Freq: Every day | ORAL | 1 refills | Status: DC
  Filled 2023-11-13: qty 90, 90d supply, fill #0
  Filled 2024-02-19: qty 90, 90d supply, fill #1

## 2023-11-13 MED ORDER — CARVEDILOL 6.25 MG PO TABS
6.2500 mg | ORAL_TABLET | Freq: Two times a day (BID) | ORAL | 1 refills | Status: DC
  Filled 2023-11-13: qty 180, 90d supply, fill #0

## 2023-11-13 MED ORDER — VALSARTAN 160 MG PO TABS
160.0000 mg | ORAL_TABLET | Freq: Every day | ORAL | 2 refills | Status: AC
  Filled 2023-11-13: qty 90, 90d supply, fill #0
  Filled 2024-02-19: qty 90, 90d supply, fill #1

## 2023-11-13 MED ORDER — METFORMIN HCL 1000 MG PO TABS
1000.0000 mg | ORAL_TABLET | Freq: Two times a day (BID) | ORAL | 1 refills | Status: DC
  Filled 2023-11-13: qty 180, 90d supply, fill #0
  Filled 2024-02-19: qty 180, 90d supply, fill #1

## 2023-11-13 NOTE — Telephone Encounter (Signed)
 Can we start a PA for Trulicity  for this patient?

## 2023-11-13 NOTE — Progress Notes (Signed)
 S:    PCP: [name]  No chief complaint on file.  Patient arrives in good spirits.  Presents for diabetes management at the request of [name]. Patient was seen and referred on 08/28/23. At that time, BP was 108/67 mmHg. A1c was last done on 07/26/23 and showed improvement down to 7.2 (down from 9.8 prior).  Pharmacy last saw him on 09/12/2023. I've seen him many times over my time here at [facility]. Unfortunately, our biggest barrier here is linguistic. His medication non-adherence is due to falling behind on his refills as he's not able to communicate with the pharmacy. Since his last visit with me, however, he has done much better with adherence.   Regarding his DM, he is checking his blood sugars at home seldomly. CGM is cost-prohibitive at this time. His insurance covers but the copay is still >$100 per patient. He endorses adherence to his medications.   Family/Social History:  - FHx: DM (mother, father) - Tobacco: never smoker - Alcohol: reports occasional use   Insurance coverage/medication affordability: [employer]vertising copy[employer]  Current diabetes medications include:  - Basaglar  50 daily (takes 30 units daily due to hypoglycemia. Last filled for a 30-day on 08/28/2023; pt tells me he is taking 30 units BID) - Metformin  1000 mg BID (last filled 08/28/2023 for a 90-ds) - Trulicity  3 mg weekly (last filled 08/28/2023 for a 28 day)  Current HTN medications include:  - Amlodipine  10 mg daily (last dispensed 08/28/2023 for a 90-day) - Carvedilol  6.25 mg BID (last dispensed 08/28/2023 for a 90-day) - Valsartan  160 mg daily (last dispensed 08/28/2023 for a 90-day)  Patient reports some symptoms of hypoglycemia. Tells me he experiences shaking and sweatiness. Tells me he has reduced his insulin  dose to 30 units daily since then.  Patient reported dietary habits:  - He does not limit carbohydrates  Patient-reported exercise habits:  - Does not exercise outside of work   Patient denies polyuria. Denies  polydipsia.  Patient denies neuropathy. Patient denies visual changes. Patient reports self foot exams.   O:  No CGM or GM present at today's visit.  Lab Results  Component Value Date   HGBA1C 6.9 11/13/2023   There were no vitals filed for this visit.  Lipid Panel     Component Value Date/Time   CHOL 72 (L) 12/05/2022 0939   TRIG 75 12/05/2022 0939   HDL 30 (L) 12/05/2022 0939   CHOLHDL 2.4 12/05/2022 0939   CHOLHDL 4.8 09/12/2016 0806   VLDL 29 09/12/2016 0806   LDLCALC 26 12/05/2022 0939   Clinical ASCVD: Yes - CAD, NSTEMI/CABG The ASCVD Risk score (Arnett DK, et al., 2019) failed to calculate for the following reasons:   Risk score cannot be calculated because patient has a medical history suggesting prior/existing ASCVD   A/P: Diabetes longstanding currently at goal with A1c of 6.9% today. Commended him for this! Collaborated with pharmacy today. We are refilling his medications for him. His medication adherence is much better. He is asymptomatic from a hyperglycemia standpoint today. He is endorsing symptoms of hypoglycemia but I have no objective values to review. Will decrease his insulin  dose today.  -Decrease Lantus  to 26 units daily. Patient is to notify me if hypoglycemia persists.  -Continue Trulicity  and metformin  at current doses.  -Extensively discussed pathophysiology of DM, recommended lifestyle interventions, dietary effects on glycemic control -Counseled on s/sx of and management of hypoglycemia -Next A1C anticipated 01/2024  Results reviewed and written information provided.    Written  patient instructions provided. Patient verbalized understanding of treatment plan.  Total time in face to face counseling 30 minutes.    Written patient instructions provided.  Total time in face to face counseling 30 minutes.   Follow up w/ me in 2 months. PCP in 2 weeks.   Herlene Fleeta Morris, PharmD, JAQUELINE, CPP Clinical Pharmacist [facility] [phone]

## 2023-11-14 ENCOUNTER — Telehealth: Payer: Self-pay

## 2023-11-14 ENCOUNTER — Other Ambulatory Visit: Payer: Self-pay

## 2023-11-14 NOTE — Telephone Encounter (Signed)
 Pharmacy Patient Advocate Encounter  Received notification from [facility] that Prior Authorization for TRULICITY  has been APPROVED from 11/13/2023 to 03/20/2024   PA #/Case ID/Reference #: [id number]

## 2023-11-15 ENCOUNTER — Other Ambulatory Visit: Payer: Self-pay

## 2023-11-24 ENCOUNTER — Other Ambulatory Visit: Payer: Self-pay

## 2023-11-24 ENCOUNTER — Encounter: Payer: Self-pay | Admitting: Gastroenterology

## 2023-11-27 ENCOUNTER — Telehealth: Payer: Self-pay | Admitting: Nurse Practitioner

## 2023-11-27 NOTE — Telephone Encounter (Signed)
 Pt unconfirmed lvm 9/9

## 2023-11-28 ENCOUNTER — Encounter: Payer: Self-pay | Admitting: Nurse Practitioner

## 2023-11-28 ENCOUNTER — Other Ambulatory Visit: Payer: Self-pay

## 2023-11-28 ENCOUNTER — Ambulatory Visit: Attending: Nurse Practitioner | Admitting: Nurse Practitioner

## 2023-11-28 VITALS — BP 152/72 | HR 57 | Temp 98.7°F | Resp 19 | Ht 65.0 in | Wt 150.8 lb

## 2023-11-28 DIAGNOSIS — B0229 Other postherpetic nervous system involvement: Secondary | ICD-10-CM | POA: Diagnosis not present

## 2023-11-28 DIAGNOSIS — I25119 Atherosclerotic heart disease of native coronary artery with unspecified angina pectoris: Secondary | ICD-10-CM | POA: Diagnosis not present

## 2023-11-28 DIAGNOSIS — R350 Frequency of micturition: Secondary | ICD-10-CM

## 2023-11-28 DIAGNOSIS — I10 Essential (primary) hypertension: Secondary | ICD-10-CM

## 2023-11-28 DIAGNOSIS — H1013 Acute atopic conjunctivitis, bilateral: Secondary | ICD-10-CM

## 2023-11-28 DIAGNOSIS — Z794 Long term (current) use of insulin: Secondary | ICD-10-CM

## 2023-11-28 DIAGNOSIS — Z23 Encounter for immunization: Secondary | ICD-10-CM | POA: Diagnosis not present

## 2023-11-28 DIAGNOSIS — J3089 Other allergic rhinitis: Secondary | ICD-10-CM

## 2023-11-28 DIAGNOSIS — E119 Type 2 diabetes mellitus without complications: Secondary | ICD-10-CM

## 2023-11-28 DIAGNOSIS — R053 Chronic cough: Secondary | ICD-10-CM

## 2023-11-28 MED ORDER — TRULICITY 3 MG/0.5ML ~~LOC~~ SOAJ
3.0000 mg | SUBCUTANEOUS | 6 refills | Status: DC
  Filled 2023-11-28: qty 2, 28d supply, fill #0
  Filled 2024-01-15: qty 2, 28d supply, fill #1
  Filled 2024-02-19: qty 2, 28d supply, fill #2

## 2023-11-28 MED ORDER — BENZONATATE 200 MG PO CAPS
200.0000 mg | ORAL_CAPSULE | Freq: Two times a day (BID) | ORAL | 0 refills | Status: DC | PRN
  Filled 2023-11-28: qty 20, 10d supply, fill #0

## 2023-11-28 MED ORDER — FLUTICASONE PROPIONATE 50 MCG/ACT NA SUSP
2.0000 | Freq: Every day | NASAL | 6 refills | Status: AC
  Filled 2023-11-28: qty 16, 30d supply, fill #0
  Filled 2024-01-15: qty 16, 30d supply, fill #1

## 2023-11-28 MED ORDER — LEVOCETIRIZINE DIHYDROCHLORIDE 5 MG PO TABS
5.0000 mg | ORAL_TABLET | Freq: Every evening | ORAL | 3 refills | Status: AC
  Filled 2023-11-28: qty 90, 90d supply, fill #0
  Filled 2024-02-19: qty 30, 30d supply, fill #1

## 2023-11-28 MED ORDER — LANTUS SOLOSTAR 100 UNIT/ML ~~LOC~~ SOPN
50.0000 [IU] | PEN_INJECTOR | Freq: Every day | SUBCUTANEOUS | 11 refills | Status: DC
  Filled 2023-11-28: qty 15, 30d supply, fill #0
  Filled 2024-01-15: qty 15, 30d supply, fill #1
  Filled 2024-02-19: qty 15, 30d supply, fill #2

## 2023-11-28 MED ORDER — OLOPATADINE HCL 0.2 % OP SOLN
OPHTHALMIC | 6 refills | Status: AC
  Filled 2023-11-28: qty 2.5, 25d supply, fill #0
  Filled 2024-01-15: qty 2.5, 25d supply, fill #1
  Filled 2024-02-19: qty 2.5, 25d supply, fill #2

## 2023-11-28 MED ORDER — GABAPENTIN 300 MG PO CAPS
900.0000 mg | ORAL_CAPSULE | Freq: Three times a day (TID) | ORAL | 2 refills | Status: AC
  Filled 2023-11-28: qty 270, 30d supply, fill #0
  Filled 2024-01-15: qty 270, 30d supply, fill #1

## 2023-11-28 MED ORDER — CARVEDILOL 6.25 MG PO TABS
6.2500 mg | ORAL_TABLET | Freq: Two times a day (BID) | ORAL | 1 refills | Status: AC
  Filled 2023-11-28 – 2024-02-19 (×3): qty 180, 90d supply, fill #0

## 2023-11-28 MED ORDER — PANTOPRAZOLE SODIUM 40 MG PO TBEC
40.0000 mg | DELAYED_RELEASE_TABLET | Freq: Every day | ORAL | 3 refills | Status: DC
  Filled 2023-11-28: qty 90, 90d supply, fill #0

## 2023-11-28 NOTE — Progress Notes (Signed)
 Assessment & Plan:  Carl Clark was seen today for hypertension.  Diagnoses and all orders for this visit:  Primary hypertension -     carvedilol  (COREG ) 6.25 MG tablet; Take 1 tablet (6.25 mg total) by mouth 2 (two) times daily with a meal. FOR BLOOD PRESSURE Hypertension management is suboptimal due to non-adherence and medication confusion. - Instruct him to bring all medications to appointments for verification. - Send prescriptions to the downstairs pharmacy as requested.  Need for influenza vaccination -     Flu vaccine HIGH DOSE PF(Fluzone Trivalent)  Post herpetic neuralgia -     gabapentin  (NEURONTIN ) 300 MG capsule; Take 3 capsules (900 mg total) by mouth 3 (three) times daily. FOR NERVE PAIN  Atherosclerosis of native coronary artery of native heart with angina pectoris (HCC) -     carvedilol  (COREG ) 6.25 MG tablet; Take 1 tablet (6.25 mg total) by mouth 2 (two) times daily with a meal. FOR BLOOD PRESSURE -     Lipid panel  Persistent cough for 3 weeks or longer -     pantoprazole  (PROTONIX ) 40 MG tablet; Take 1 tablet (40 mg total) by mouth daily. FOR ACID REFLUX -     CBC with Differential -     QuantiFERON-TB Gold Plus -     benzonatate  (TESSALON ) 200 MG capsule; Take 1 capsule (200 mg total) by mouth 2 (two) times daily as needed for cough. Chronic cough Chronic cough likely due to untreated allergic rhinitis and GERD. No lung issues on x-ray. - Prescribe cough medication for symptomatic relief, especially at night. - Check white blood cell count; prescribe antibiotics if elevated.   Type 2 diabetes mellitus with insulin  therapy (HCC) -     insulin  glargine (LANTUS  SOLOSTAR) 100 UNIT/ML Solostar Pen; Inject 50 Units into the skin daily. -     Dulaglutide  (TRULICITY ) 3 MG/0.5ML SOAJ; Inject 3 mg as directed once a week. Diabetes management requires adjustment due to insurance changes. Lantus  will replace Basaglar . - Send prescription for Lantus  to the pharmacy. -  Ensure he has enough refills for diabetes medications.   Allergic conjunctivitis of both eyes -     Olopatadine  HCl (PATADAY ) 0.2 % SOLN; Apply 1 drop to both eyes daily.  Frequency of urination -     PSA  Environmental and seasonal allergies -     fluticasone  (FLONASE ) 50 MCG/ACT nasal spray; Place 2 sprays into both nostrils daily. FOR ALLERGIES -     levocetirizine (XYZAL ) 5 MG tablet; Take 1 tablet (5 mg total) by mouth every evening. FOR ALLERGIES Allergic rhinitis likely contributes to chronic cough due to non-adherence to medication. - Reinforce the importance of regular use of allergy medications, including nasal spray.   Gastroesophageal reflux disease (GERD) GERD may contribute to chronic cough and sore throat due to non-adherence to medication. - Reinforce the importance of regular use of acid reflux medication.   General Health Maintenance Flu vaccination is due and can be administered. - Administer flu vaccine.  Patient has been counseled on age-appropriate routine health concerns for screening and prevention. These are reviewed and up-to-date. Referrals have been placed accordingly. Immunizations are up-to-date or declined.    Subjective:   Chief Complaint  Patient presents with   Hypertension   History of Present Illness Carl Clark is a 73 year old male with hypertension and diabetes who presents with a persistent cough and medication management issues.  VRI was used to communicate directly with patient for the entire  encounter including providing detailed patient instructions.    He has had a persistent cough for more than two weeks, causing significant exhaustion. He often experiences issues with cough and cold and is not currently taking his allergy or acid reflux medications. He describes a sore throat, particularly at night, accompanied by feverish sensations and chest discomfort.  HTN He is on multiple medications for hypertension, including  amlodipine  and valsartan , which he picked up on August 25th. However, he did not pick up his carvedilol . He has not taken his blood pressure medications for two days due to confusion over his medication regimen.  DM He has diabetes and is using Lantus  and Trulicity .  Lab Results  Component Value Date   HGBA1C 6.9 11/13/2023     He experiences nocturia, getting up more than twice a night to urinate, and his sleep is limited to three to four hours per night. Review of Systems  Constitutional:  Negative for fever, malaise/fatigue and weight loss.  HENT:  Positive for congestion and sore throat. Negative for nosebleeds.   Eyes:  Positive for discharge. Negative for blurred vision, double vision and photophobia.  Respiratory:  Positive for cough. Negative for shortness of breath.   Cardiovascular: Negative.  Negative for chest pain, palpitations and leg swelling.  Gastrointestinal:  Positive for heartburn. Negative for nausea and vomiting.  Genitourinary:  Positive for frequency. Negative for dysuria, flank pain, hematuria and urgency.  Musculoskeletal: Negative.  Negative for myalgias.  Neurological: Negative.  Negative for dizziness, focal weakness, seizures and headaches.  Endo/Heme/Allergies:  Positive for environmental allergies.  Psychiatric/Behavioral: Negative.  Negative for suicidal ideas.     Past Medical History:  Diagnosis Date   CAD (coronary artery disease) 03/16/2017   S/p NSTEMI 6/18 >> LHC: multivessel CAD >> s/p CABG // Echo 6/18: Mild LVH, EF 60-65, normal wall motion, grade 1 diastolic dysfunction, normal RV SF, PASP 36 (borderline pulmonary hypertension) // Pre-CABG Dopplers 09/13/16: Bilateral ICA 1-39   Echocardiogram    Echo 09/2018: EF 60-65, normal wall motion, RVSP 24.9   Family history of anesthesia complication    never had anesthesia (07/14/2012)   History of non-ST elevation myocardial infarction (NSTEMI) 09/12/2016   Treated with CABG   History of rectal  bleeding    secondary to diverticulum affecting right hepatic flexure - 06/2012   Hypertension    Mild carotid artery disease (HCC)    a. 1-39% bilaterally preCABG in 2018.   Type II diabetes mellitus [facility])     Past Surgical History:  Procedure Laterality Date   COLONOSCOPY Left 07/14/2012   Procedure: COLONOSCOPY;  Surgeon: Elsie Cree, MD;  Location: [facility] ENDOSCOPY;  Service: Endoscopy;  Laterality: Left;   CORONARY ARTERY BYPASS GRAFT N/A 09/15/2016   Procedure: CORONARY ARTERY BYPASS GRAFTING (CABG) x 3, LIMA to LAD, SVG to DIAGONAL, SVG to OM2, USING LEFT MAMMARY ARTERY AND RIGHT GREATER SAPHENOUS VEIN HARVESTED ENDOSCOPICALLY;  Surgeon: Army Dallas NOVAK, MD;  Location: [facility] OR;  Service: Open Heart Surgery;  Laterality: N/A;   ESOPHAGOGASTRODUODENOSCOPY N/A 07/13/2012   Procedure: ESOPHAGOGASTRODUODENOSCOPY (EGD);  Surgeon: Elsie Cree, MD;  Location: [facility] ENDOSCOPY;  Service: Endoscopy;  Laterality: N/A;  pt in ED A8   LEFT HEART CATH AND CORONARY ANGIOGRAPHY N/A 09/12/2016   Procedure: Left Heart Cath and Coronary Angiography;  Surgeon: Wonda Sharper, MD;  Location: [facility] INVASIVE CV LAB;  Service: Cardiovascular;  Laterality: N/A;   NO PAST SURGERIES     TEE WITHOUT CARDIOVERSION N/A 09/15/2016   Procedure:  TRANSESOPHAGEAL ECHOCARDIOGRAM (TEE);  Surgeon: Army Dallas NOVAK, MD;  Location: [facility] OR;  Service: Open Heart Surgery;  Laterality: N/A;    Family History  Problem Relation Age of Onset   Diabetes Mother    Diabetes Father     Social History Reviewed with no changes to be made today.   Outpatient Medications Prior to Visit  Medication Sig Dispense Refill   Accu-Chek Softclix Lancets lancets Use to check blood sugar 2 times daily. E11.9 100 each 6   amLODipine  (NORVASC ) 10 MG tablet Take 1 tablet (10 mg total) by mouth daily. FOR BLOOD PRESSURE 90 tablet 1   aspirin  EC 81 MG tablet Take 1 tablet (81 mg total) by mouth daily. Swallow whole. 90 tablet 3   Blood Glucose  Monitoring Suppl (ACCU-CHEK GUIDE ME) w/Device KIT Use to check blood sugar 2 times daily. 1 kit 0   Ferrous Sulfate  (IRON ) 325 (65 Fe) MG TABS Take 1 tablet (325 mg total) by mouth 2 (two) times daily. Take with OJ or vitamin C 60 tablet 1   glucose blood (ACCU-CHEK GUIDE TEST) test strip Use to check blood sugar 2 times daily. E11.9 100 each 6   metFORMIN  (GLUCOPHAGE ) 1000 MG tablet Take 1 tablet (1,000 mg total) by mouth 2 (two) times daily with a meal. For DIABETES. 180 tablet 1   Misc. Devices MISC Please provide patient with an insurance approved QUAD CANE 1 each 0   rosuvastatin  (CRESTOR ) 20 MG tablet Take 1 tablet (20 mg total) by mouth daily. STOP atorvastatin . FOR CHOLESTEROL 90 tablet 3   traZODone  (DESYREL ) 50 MG tablet Take 1-2 tablets (50-100 mg total) by mouth at bedtime. For SLEEP 180 tablet 1   triamcinolone  (KENALOG ) 0.025 % ointment Apply 1 Application topically 2 (two) times daily onto the back of the head 454 g 1   valsartan  (DIOVAN ) 160 MG tablet Take 1 tablet (160 mg total) by mouth daily. For blood pressure 90 tablet 2   carvedilol  (COREG ) 6.25 MG tablet Take 1 tablet (6.25 mg total) by mouth 2 (two) times daily with a meal. 180 tablet 1   Dulaglutide  (TRULICITY ) 3 MG/0.5ML SOAJ Inject 3 mg as directed once a week. 2 mL 6   gabapentin  (NEURONTIN ) 300 MG capsule Take 3 capsules (900 mg total) by mouth 3 (three) times daily. 270 capsule 2   Insulin  Glargine (BASAGLAR  KWIKPEN) 100 UNIT/ML Inject 50 Units into the skin daily. Inject in the morning. 15 mL 2   Olopatadine  HCl (PATADAY ) 0.2 % SOLN Apply 1 drop to both eyes daily. 2.5 mL 6   pantoprazole  (PROTONIX ) 40 MG tablet TAKE 1 TABLET (40 MG TOTAL) BY MOUTH DAILY. 30 tablet 11   nitroGLYCERIN  (NITROSTAT ) 0.4 MG SL tablet Dissolve 1 tablet under the tongue every 5 minutes as needed for chest pain. Max of 3 doses, then 911. (Patient not taking: Reported on 11/28/2023) 25 tablet 6   cetirizine  (ZYRTEC ) 10 MG tablet Take 1 tablet (10  mg total) by mouth daily. (Patient not taking: Reported on 11/28/2023) 30 tablet 11   fluticasone  (FLONASE ) 50 MCG/ACT nasal spray Place 2 sprays into both nostrils daily. (Patient not taking: Reported on 11/28/2023) 16 g 6   traMADol  (ULTRAM ) 50 MG tablet Take 1 tablet (50 mg total) by mouth every 12 (twelve) hours as needed. (Patient not taking: Reported on 11/28/2023) 60 tablet 0   valACYclovir  (VALTREX ) 1000 MG tablet Take 1 tablet (1,000 mg total) by mouth 3 (three) times daily. (Patient not  taking: Reported on 11/28/2023) 21 tablet 0   Facility-Administered Medications Prior to Visit  Medication Dose Route Frequency Provider Last Rate Last Admin   sodium chloride  0.9 % bolus 1,000 mL  1,000 mL Intravenous Once Danton Slough M, PA-C   Stopped at 11/07/22 1730    Allergies  Allergen Reactions   Victoza  [Liraglutide ] Swelling    Lip swelling, itching, rash       Objective:    BP (!) 152/72 (BP Location: Left Arm, Patient Position: Sitting, Cuff Size: Normal)   Pulse (!) 57   Temp 98.7 F (37.1 C) (Oral)   Resp 19   Ht 5' 5 (1.651 m)   Wt 150 lb 12.8 oz (68.4 kg)   SpO2 99%   BMI 25.09 kg/m  Wt Readings from Last 3 Encounters:  11/28/23 150 lb 12.8 oz (68.4 kg)  10/03/23 150 lb (68 kg)  08/28/23 155 lb (70.3 kg)    Physical Exam Vitals and nursing note reviewed.  Constitutional:      Appearance: He is well-developed.  HENT:     Head: Normocephalic and atraumatic.  Cardiovascular:     Rate and Rhythm: Normal rate and regular rhythm.     Heart sounds: Normal heart sounds. No murmur heard.    No friction rub. No gallop.  Pulmonary:     Effort: Pulmonary effort is normal. No tachypnea or respiratory distress.     Breath sounds: Normal breath sounds. No decreased breath sounds, wheezing, rhonchi or rales.  Chest:     Chest wall: No tenderness.  Musculoskeletal:        General: Normal range of motion.     Cervical back: Normal range of motion.  Skin:    General: Skin is  warm and dry.  Neurological:     Mental Status: He is alert and oriented to person, place, and time.     Coordination: Coordination normal.  Psychiatric:        Behavior: Behavior normal. Behavior is cooperative.        Thought Content: Thought content normal.        Judgment: Judgment normal.          Patient has been counseled extensively about nutrition and exercise as well as the importance of adherence with medications and regular follow-up. The patient was given clear instructions to go to ER or return to medical center if symptoms don't improve, worsen or new problems develop. The patient verbalized understanding.   Follow-up: Return in about 4 months (around 03/29/2024).   Carl LELON Servant, FNP-BC [facility] [place], [place] [phone]   11/28/2023, 1:35 PM

## 2023-11-29 ENCOUNTER — Other Ambulatory Visit: Payer: Self-pay

## 2023-12-01 LAB — QUANTIFERON-TB GOLD PLUS
QuantiFERON Mitogen Value: >10 [IU]/mL
QuantiFERON Nil Value: 0.05 [IU]/mL
QuantiFERON TB1 Ag Value: 0.05 [IU]/mL
QuantiFERON TB2 Ag Value: 0.05 [IU]/mL
QuantiFERON-TB Gold Plus: NEGATIVE

## 2023-12-01 LAB — LIPID PANEL
Chol/HDL Ratio: 7.1 ratio — ABNORMAL HIGH (ref 0.0–5.0)
Cholesterol, Total: 212 mg/dL — ABNORMAL HIGH (ref 100–199)
HDL: 30 mg/dL — ABNORMAL LOW (ref >39)
LDL Chol Calc (NIH): 110 mg/dL — ABNORMAL HIGH (ref 0–99)
Triglycerides: 419 mg/dL — ABNORMAL HIGH (ref 0–149)
VLDL Cholesterol Cal: 72 mg/dL — ABNORMAL HIGH (ref 5–40)

## 2023-12-01 LAB — CBC WITH DIFFERENTIAL/PLATELET
Basophils Absolute: 0 x10E3/uL (ref 0.0–0.2)
Basos: 1 %
EOS (ABSOLUTE): 0.7 x10E3/uL — ABNORMAL HIGH (ref 0.0–0.4)
Eos: 18 %
Hematocrit: 38.5 % (ref 37.5–51.0)
Hemoglobin: 12.5 g/dL — ABNORMAL LOW (ref 13.0–17.7)
Immature Grans (Abs): 0 x10E3/uL (ref 0.0–0.1)
Immature Granulocytes: 0 %
Lymphocytes Absolute: 1.4 x10E3/uL (ref 0.7–3.1)
Lymphs: 38 %
MCH: 32.5 pg (ref 26.6–33.0)
MCHC: 32.5 g/dL (ref 31.5–35.7)
MCV: 100 fL — ABNORMAL HIGH (ref 79–97)
Monocytes Absolute: 0.4 x10E3/uL (ref 0.1–0.9)
Monocytes: 10 %
Neutrophils Absolute: 1.2 x10E3/uL — ABNORMAL LOW (ref 1.4–7.0)
Neutrophils: 33 %
Platelets: 251 x10E3/uL (ref 150–450)
RBC: 3.85 x10E6/uL — ABNORMAL LOW (ref 4.14–5.80)
RDW: 13.2 % (ref 11.6–15.4)
WBC: 3.7 x10E3/uL (ref 3.4–10.8)

## 2023-12-01 LAB — PSA: Prostate Specific Ag, Serum: 3.8 ng/mL (ref 0.0–4.0)

## 2023-12-04 ENCOUNTER — Encounter: Admitting: Gastroenterology

## 2023-12-05 ENCOUNTER — Other Ambulatory Visit: Payer: Self-pay

## 2023-12-05 ENCOUNTER — Emergency Department (HOSPITAL_COMMUNITY)

## 2023-12-05 ENCOUNTER — Emergency Department (HOSPITAL_COMMUNITY)
Admission: EM | Admit: 2023-12-05 | Discharge: 2023-12-06 | Disposition: A | Discharge status: Home / Self Care | Attending: Emergency Medicine | Admitting: Emergency Medicine

## 2023-12-05 DIAGNOSIS — Z794 Long term (current) use of insulin: Secondary | ICD-10-CM | POA: Insufficient documentation

## 2023-12-05 DIAGNOSIS — Z7982 Long term (current) use of aspirin: Secondary | ICD-10-CM | POA: Diagnosis not present

## 2023-12-05 DIAGNOSIS — K219 Gastro-esophageal reflux disease without esophagitis: Secondary | ICD-10-CM

## 2023-12-05 DIAGNOSIS — R110 Nausea: Secondary | ICD-10-CM | POA: Diagnosis present

## 2023-12-05 LAB — CBC
HCT: 40.6 % (ref 39.0–52.0)
Hemoglobin: 13.3 g/dL (ref 13.0–17.0)
MCH: 32.2 pg (ref 26.0–34.0)
MCHC: 32.8 g/dL (ref 30.0–36.0)
MCV: 98.3 fL (ref 80.0–100.0)
Platelets: 243 K/uL (ref 150–400)
RBC: 4.13 MIL/uL — ABNORMAL LOW (ref 4.22–5.81)
RDW: 12.7 % (ref 11.5–15.5)
WBC: 4.6 K/uL (ref 4.0–10.5)
nRBC: 0 % (ref 0.0–0.2)

## 2023-12-05 LAB — LIPASE, BLOOD: Lipase: 25 U/L (ref 11–51)

## 2023-12-05 LAB — COMPREHENSIVE METABOLIC PANEL WITH GFR
ALT: 15 U/L (ref 0–44)
AST: 22 U/L (ref 15–41)
Albumin: 3.7 g/dL (ref 3.5–5.0)
Alkaline Phosphatase: 62 U/L (ref 38–126)
Anion gap: 12 (ref 5–15)
BUN: 20 mg/dL (ref 8–23)
CO2: 23 mmol/L (ref 22–32)
Calcium: 9.5 mg/dL (ref 8.9–10.3)
Chloride: 101 mmol/L (ref 98–111)
Creatinine, Ser: 1.13 mg/dL (ref 0.61–1.24)
GFR, Estimated: >60 mL/min (ref >60)
Glucose, Bld: 121 mg/dL — ABNORMAL HIGH (ref 70–99)
Potassium: 4.3 mmol/L (ref 3.5–5.1)
Sodium: 136 mmol/L (ref 135–145)
Total Bilirubin: 0.8 mg/dL (ref 0.0–1.2)
Total Protein: 8.1 g/dL (ref 6.5–8.1)

## 2023-12-05 LAB — TROPONIN I (HIGH SENSITIVITY)
Troponin I (High Sensitivity): 8 ng/L (ref <18)
Troponin I (High Sensitivity): 8 ng/L (ref <18)

## 2023-12-05 NOTE — ED Provider Triage Note (Signed)
 Emergency Medicine Provider Triage Evaluation Note  Carl Clark , a 73 y.o. male  was evaluated in triage.  Pt complains of CP 3 days. Reports GERD/reflex with eating and nausea. This is new since eating. No SOB. Does not feel like he has esophageal bolus.   Review of Systems  Positive: CP Negative: Abdominal pain  Physical Exam  BP 135/75 (BP Location: Right Arm)   Pulse 73   Temp 98.2 F (36.8 C)   Resp 16   SpO2 96%  Gen:   Awake, no distress   Resp:  Normal effort  MSK:   Moves extremities without difficulty  Other:    Medical Decision Making  Medically screening exam initiated at 7:49 PM.  Appropriate orders placed.  Carl Clark was informed that the remainder of the evaluation will be completed by another provider, this initial triage assessment does not replace that evaluation, and the importance of remaining in the ED until their evaluation is complete.     Shermon Warren SAILOR, PA-C 12/05/23 1949

## 2023-12-05 NOTE — ED Notes (Signed)
 Unable to quick triage. Needs interpreter. Nad nard

## 2023-12-05 NOTE — ED Triage Notes (Signed)
 Patient reports persistent gastric reflux after eating for 3 days .

## 2023-12-06 ENCOUNTER — Other Ambulatory Visit: Payer: Self-pay

## 2023-12-06 ENCOUNTER — Other Ambulatory Visit (HOSPITAL_COMMUNITY): Payer: Self-pay

## 2023-12-06 ENCOUNTER — Ambulatory Visit: Payer: Self-pay | Admitting: Nurse Practitioner

## 2023-12-06 DIAGNOSIS — R110 Nausea: Secondary | ICD-10-CM | POA: Diagnosis not present

## 2023-12-06 LAB — CBG MONITORING, ED
Glucose-Capillary: 75 mg/dL (ref 70–99)
Glucose-Capillary: 92 mg/dL (ref 70–99)

## 2023-12-06 MED ORDER — MAALOX MAX 400-400-40 MG/5ML PO SUSP: 5.0000 mL | Freq: Four times a day (QID) | ORAL | 0 refills | Status: DC | PRN

## 2023-12-06 MED ORDER — ONDANSETRON 4 MG PO TBDP
4.0000 mg | ORAL_TABLET | Freq: Once | ORAL | Status: AC
Start: 2023-12-06 — End: 2023-12-06
  Administered 2023-12-06: 4 mg via ORAL
  Filled 2023-12-06: qty 1

## 2023-12-06 MED ORDER — LIDOCAINE VISCOUS HCL 2 % MT SOLN
15.0000 mL | Freq: Once | OROMUCOSAL | Status: AC
  Administered 2023-12-06: 15 mL via OROMUCOSAL
  Filled 2023-12-06: qty 15

## 2023-12-06 MED ORDER — ALUM & MAG HYDROXIDE-SIMETH 200-200-20 MG/5ML PO SUSP
10.0000 mL | Freq: Four times a day (QID) | ORAL | 0 refills | Status: DC | PRN
  Filled 2023-12-06: qty 355, 9d supply, fill #0

## 2023-12-06 MED ORDER — ALUM & MAG HYDROXIDE-SIMETH 200-200-20 MG/5ML PO SUSP
30.0000 mL | Freq: Once | ORAL | Status: AC
  Administered 2023-12-06: 30 mL via ORAL
  Filled 2023-12-06: qty 30

## 2023-12-06 NOTE — Discharge Instructions (Signed)
??? ?? ???? ?????? ?? ??? ???????. ???? ????? ?????? ???? ?????? ????? ??????. ????? ?? ????? ??? ????????? ????? ?????????? ??????? ?????? ????? ????? ??? ??????. ???? ?? ???? ??????? ?????? ??????? ????? ?? ??????? ????????. ????? ??????? ?? ?????? ??? ?????? ??????. ?? ??? ????? ??????? ?? ???? ?? ????? ???? ????? ????? ?????? ??? ??? ??????? ?????? ???? ?? ???????.   laqad tama fahsuk wakhurujuk min qism altawarii. kanat natayij fuhusat aldam wataqyim alqalb tabieiatan. aistamara fi tanawul himd alrilufiks biwsfat brutuniks, waistakhdim malwks biwasfat jadidat eind alhajati. tabie mae maqadam alrieayat alsihiyat alrayiysii limazid min altaqyim walrieayati. tanawal al'adwiat fi almanzil hasab alwasfat altibiyati. fi hal tafaqum al'aerad 'aw zuhur 'ayi makhawif sihiyat 'queen laundry aleawdat 'iilaa qism altawari li'iijra' mazid min altaqyimi.

## 2023-12-06 NOTE — ED Provider Notes (Signed)
 [place] EMERGENCY DEPARTMENT AT [facility] Provider Note   CSN: [id number] Arrival date & time: 12/05/23  1901     Patient presents with: Gastric Reflux   Carl Clark is a 73 y.o. male.   HPI   73 year old arabic speaking male presents with nausea and reflux symptoms. Interpreter services used.  Patient presents with 3 days of nausea and reflux like symptoms.  Does take Protonix  daily and has been compliant.  States that anytime he tries to eat or drink he becomes nauseous, has resulted in nonbloody emesis.  Denies any fever, acute abdominal pain, diarrhea, genitourinary symptoms.  He has no chest pain, shortness of breath.  Prior to Admission medications   Medication Sig Start Date End Date Taking? Authorizing Provider  Accu-Chek Softclix Lancets lancets Use to check blood sugar 2 times daily. E11.9 09/12/23   Newlin, Enobong, MD  amLODipine  (NORVASC ) 10 MG tablet Take 1 tablet (10 mg total) by mouth daily. FOR BLOOD PRESSURE 11/13/23   Delbert Clam, MD  aspirin  EC 81 MG tablet Take 1 tablet (81 mg total) by mouth daily. Swallow whole. 11/17/21   Wonda Sharper, MD  benzonatate  (TESSALON ) 200 MG capsule Take 1 capsule (200 mg total) by mouth 2 (two) times daily as needed for cough. 11/28/23   Fleming, [name] W, NP  Blood Glucose Monitoring Suppl (ACCU-CHEK GUIDE ME) w/Device KIT Use to check blood sugar 2 times daily. 09/12/23   Newlin, Enobong, MD  carvedilol  (COREG ) 6.25 MG tablet Take 1 tablet (6.25 mg total) by mouth 2 (two) times daily with a meal. FOR BLOOD PRESSURE 11/28/23   Fleming, [name] W, NP  Dulaglutide  (TRULICITY ) 3 MG/0.5ML SOAJ Inject 3 mg as directed once a week. 11/28/23   Fleming, [name] W, NP  Ferrous Sulfate  (IRON ) 325 (65 Fe) MG TABS Take 1 tablet (325 mg total) by mouth 2 (two) times daily. Take with OJ or vitamin C 10/03/23   Collier, Amanda R, PA-C  fluticasone  (FLONASE ) 50 MCG/ACT nasal spray Place 2 sprays into both nostrils daily. FOR  ALLERGIES 11/28/23   Fleming, [name] W, NP  gabapentin  (NEURONTIN ) 300 MG capsule Take 3 capsules (900 mg total) by mouth 3 (three) times daily. FOR NERVE PAIN 11/28/23   Fleming, [name] W, NP  glucose blood (ACCU-CHEK GUIDE TEST) test strip Use to check blood sugar 2 times daily. E11.9 09/12/23   Newlin, Enobong, MD  insulin  glargine (LANTUS  SOLOSTAR) 100 UNIT/ML Solostar Pen Inject 50 Units into the skin daily. 11/28/23   Fleming, [name] W, NP  levocetirizine (XYZAL ) 5 MG tablet Take 1 tablet (5 mg total) by mouth every evening. FOR ALLERGIES 11/28/23   Fleming, [name] W, NP  metFORMIN  (GLUCOPHAGE ) 1000 MG tablet Take 1 tablet (1,000 mg total) by mouth 2 (two) times daily with a meal. For DIABETES. 11/13/23   Delbert Clam, MD  Misc. Devices MISC Please provide patient with an insurance approved QUAD CANE 08/10/17   Fleming, [name] W, NP  nitroGLYCERIN  (NITROSTAT ) 0.4 MG SL tablet Dissolve 1 tablet under the tongue every 5 minutes as needed for chest pain. Max of 3 doses, then 911. Patient not taking: Reported on 11/28/2023 11/17/21   Cooper, Michael, MD  Olopatadine  HCl (PATADAY ) 0.2 % SOLN Apply 1 drop to both eyes daily. 11/28/23   Fleming, [name] W, NP  pantoprazole  (PROTONIX ) 40 MG tablet Take 1 tablet (40 mg total) by mouth daily. FOR ACID REFLUX 11/28/23   Fleming, [name] W, NP  rosuvastatin  (CRESTOR )  20 MG tablet Take 1 tablet (20 mg total) by mouth daily. STOP atorvastatin . FOR CHOLESTEROL 04/25/23   Fleming, [name] W, NP  traZODone  (DESYREL ) 50 MG tablet Take 1-2 tablets (50-100 mg total) by mouth at bedtime. For SLEEP 05/23/22   Fleming, [name] W, NP  triamcinolone  (KENALOG ) 0.025 % ointment Apply 1 Application topically 2 (two) times daily onto the back of the head 02/21/22   [name] ORN, NP  valsartan  (DIOVAN ) 160 MG tablet Take 1 tablet (160 mg total) by mouth daily. For blood pressure 11/13/23   Newlin, Enobong, MD    Allergies: Victoza  [liraglutide ]    Review of Systems  Constitutional:  Positive for  appetite change and fatigue. Negative for fever.  Respiratory:  Negative for shortness of breath.   Cardiovascular:  Negative for chest pain.  Gastrointestinal:  Positive for nausea and vomiting. Negative for abdominal distention, abdominal pain, blood in stool and diarrhea.  Skin:  Negative for rash.  Neurological:  Negative for headaches.    Updated Vital Signs BP (!) 154/73 (BP Location: Right Arm)   Pulse 63   Temp 97.9 F (36.6 C) (Oral)   Resp 14   SpO2 100%   Physical Exam Vitals and nursing note reviewed.  Constitutional:      Appearance: Normal appearance.  HENT:     Head: Normocephalic.     Mouth/Throat:     Mouth: Mucous membranes are moist.  Cardiovascular:     Rate and Rhythm: Normal rate.  Pulmonary:     Effort: Pulmonary effort is normal. No respiratory distress.  Abdominal:     Palpations: Abdomen is soft.     Tenderness: There is no abdominal tenderness. There is no guarding or rebound.  Skin:    General: Skin is warm.  Neurological:     Mental Status: He is alert and oriented to person, place, and time. Mental status is at baseline.  Psychiatric:        Mood and Affect: Mood normal.     (all labs ordered are listed, but only abnormal results are displayed) Labs Reviewed  CBC - Abnormal; Notable for the following components:      Result Value   RBC 4.13 (*)    All other components within normal limits  COMPREHENSIVE METABOLIC PANEL WITH GFR - Abnormal; Notable for the following components:   Glucose, Bld 121 (*)    All other components within normal limits  LIPASE, BLOOD  CBG MONITORING, ED  CBG MONITORING, ED  TROPONIN I (HIGH SENSITIVITY)  TROPONIN I (HIGH SENSITIVITY)    EKG: EKG Interpretation Date/Time:  Tuesday December 05 2023 19:58:14 EDT Ventricular Rate:  66 PR Interval:  152 QRS Duration:  86 QT Interval:  352 QTC Calculation: 369 R Axis:   58  Text Interpretation: Normal sinus rhythm Possible Anterior infarct , age  undetermined Abnormal ECG When compared with ECG of 09-Oct-2022 05:53, No significant change was found Confirmed by Raford Lenis ([id number]) on 12/06/2023 2:04:26 AM  Radiology: DG Chest 2 View Result Date: 12/05/2023 CLINICAL DATA:  Chest pain for several days EXAM: CHEST - 2 VIEW COMPARISON:  10/27/2023 FINDINGS: Cardiac shadow is within normal limits. Lungs are well aerated bilaterally. No focal infiltrate or sizable effusion is seen. Postsurgical changes are again seen and stable. No bony abnormality is noted. IMPRESSION: No active cardiopulmonary disease. Electronically Signed   By: Oneil Devonshire M.D.   On: 12/05/2023 20:08     Procedures   Medications Ordered in the ED  lidocaine  (XYLOCAINE ) 2 % viscous mouth solution 15 mL (15 mLs Mouth/Throat Given 12/06/23 0918)  ondansetron  (ZOFRAN -ODT) disintegrating tablet 4 mg (4 mg Oral Given 12/06/23 0918)  alum & mag hydroxide-simeth (MAALOX/MYLANTA) 200-200-20 MG/5ML suspension 30 mL (30 mLs Oral Given 12/06/23 0918)                                    Medical Decision Making Risk OTC drugs. Prescription drug management.   73 year old male presents emergency department with acid reflux-like symptoms.  Denies any chest pain, heaviness.  Vitals are stable on arrival.  EKG shows no ischemic changes.  Blood work is reassuring, abdominal labs are normal, troponin is negative.  After Zofran  and a GI cocktail symptoms have resolved.  Is been able to eat and drink without difficulty.  Believe that this is more GI/reflux mediated.  He is prescribed Protonix , it sounds like he is taking it but I encourage compliance with this and also added Maalox and will plan for outpatient follow-up.  Patient at this time appears safe and stable for discharge and close outpatient follow up. Discharge plan and strict return to ED precautions discussed, patient verbalizes understanding and agreement.     Final diagnoses:  None    ED Discharge Orders     None           Bari Roxie HERO, DO 12/06/23 1124

## 2023-12-15 ENCOUNTER — Other Ambulatory Visit: Payer: Self-pay

## 2024-01-15 ENCOUNTER — Encounter: Payer: Self-pay | Admitting: Pharmacist

## 2024-01-15 ENCOUNTER — Other Ambulatory Visit: Payer: Self-pay

## 2024-01-15 ENCOUNTER — Ambulatory Visit: Attending: Family Medicine | Admitting: Pharmacist

## 2024-01-15 VITALS — BP 109/67 | HR 50

## 2024-01-15 DIAGNOSIS — E1165 Type 2 diabetes mellitus with hyperglycemia: Secondary | ICD-10-CM

## 2024-01-15 DIAGNOSIS — Z7985 Long-term (current) use of injectable non-insulin antidiabetic drugs: Secondary | ICD-10-CM | POA: Diagnosis not present

## 2024-01-15 DIAGNOSIS — M545 Low back pain, unspecified: Secondary | ICD-10-CM

## 2024-01-15 DIAGNOSIS — I10 Essential (primary) hypertension: Secondary | ICD-10-CM

## 2024-01-15 DIAGNOSIS — Z794 Long term (current) use of insulin: Secondary | ICD-10-CM | POA: Diagnosis not present

## 2024-01-15 DIAGNOSIS — Z7984 Long term (current) use of oral hypoglycemic drugs: Secondary | ICD-10-CM

## 2024-01-15 MED ORDER — DICLOFENAC SODIUM 75 MG PO TBEC
75.0000 mg | DELAYED_RELEASE_TABLET | Freq: Two times a day (BID) | ORAL | 1 refills | Status: DC
  Filled 2024-01-15: qty 60, 30d supply, fill #0

## 2024-01-15 NOTE — Patient Instructions (Addendum)
 Thank you for coming to see me today. Please do the following:  Medication Reason for Taking  Amlodipine  High blood pressure   Carvedilol  High blood pressure  Valsartan  High blood pressure     Trulicity  Diabetes  Lantus  (insulin ) Diabetes   Metformin  Diabetes     Rosuvastatin   Cholesterol     Gabapentin   Nerve pain  Diclofenac  Back pain/knee pain     Iron  (ferrous sulfate ) Anemia/blood counts     Nose spray  Allergies   Eye drops  Allergies

## 2024-01-15 NOTE — Progress Notes (Signed)
 S:    PCP: [name]  No chief complaint on file.  Patient arrives in good spirits.  Presents for diabetes management at the request of [name]. Patient was seen and referred on 11/28/2023. At that time, BP was 152/72 mmHg. Of note, LDL came back at 110 mg/dL (up from 26 mg/dL a year ago). Due to that, it was noted that he was unlikely taking his cholesterol medications and was told to return with all home meds for review.  Of note, pharmacy last saw him on 11/13/2023. A1c was 6.9% at that visit! I continued his DM medication regimen without changes. I've seen him many times over my time here at [facility]. Unfortunately, our biggest barrier here is linguistic, and this contributes to his medication non-adherence.  Regarding his DM, he is checking his blood sugars at home seldomly. CGM is cost-prohibitive at this time. His insurance covers but the copay is still >$100 per patient. He endorses adherence to his medications.  Family/Social History:  - FHx: DM (mother, father) - Tobacco: never smoker - Alcohol: reports occasional use   Insurance coverage/medication affordability: [employer]  Current diabetes medications include:  - Lantus  26 daily (Last filled for a 30-day on 11/28/23) - Metformin  1000 mg BID (last filled 11/13/23 for a 90-ds) - Trulicity  3 mg weekly (last filled 11/28/23 for a 28 day)  Current HTN medications include:  - Amlodipine  10 mg daily (last dispensed 11/13/23 for a 90-day) - Carvedilol  6.25 mg BID (last dispensed 11/13/23 for a 90-day) - Valsartan  160 mg daily (last dispensed 11/13/23 for a 90-day)  Current HLD medications include:  - Rosuvastatin  20 mg daily (last dispensed 12/15/2023 for a 90-day) -Of note, prior to his fill last month, his rosuvastatin  was last filled 07/13/2023.  Patient denies symptoms of hypoglycemia.   Patient reported dietary habits:  - He does not limit carbohydrates  Patient-reported exercise habits:  - Does not exercise outside of work    Patient denies polyuria. Denies polydipsia.  Patient denies neuropathy. Patient denies visual changes. Patient reports self foot exams.   O:  No CGM or GM present at today's visit. Reports readings at home are in the 140s - 170s  Lab Results  Component Value Date   HGBA1C 6.9 11/13/2023   Vitals:   01/15/24 1059  BP: 109/67  Pulse: (!) 50    Lipid Panel     Component Value Date/Time   CHOL 212 (H) 11/28/2023 1028   TRIG 419 (H) 11/28/2023 1028   HDL 30 (L) 11/28/2023 1028   CHOLHDL 7.1 (H) 11/28/2023 1028   CHOLHDL 4.8 09/12/2016 0806   VLDL 29 09/12/2016 0806   LDLCALC 110 (H) 11/28/2023 1028   Clinical ASCVD: Yes - CAD, NSTEMI/CABG The ASCVD Risk score (Arnett DK, et al., 2019) failed to calculate for the following reasons:   Risk score cannot be calculated because patient has a medical history suggesting prior/existing ASCVD   A/P: Diabetes longstanding currently at goal with A1c of 6.9% in Aug of this year. Reported home sugars are at goal. Commended him for this! Collaborated with pharmacy today. We are refilling his medications that are due today. His medication adherence is better overall. He is asymptomatic from a hyperglycemia standpoint today. He is endorsing symptoms of hypoglycemia but I have no objective values to review.  -Continue current regimen. Refills given.\ -Extensively discussed pathophysiology of DM, recommended lifestyle interventions, dietary effects on glycemic control -Counseled on s/sx of and management of hypoglycemia -Next A1C anticipated 01/2024  Results reviewed and written information provided.    Written patient instructions provided. Patient verbalized understanding of treatment plan.  Total time in face to face counseling 30 minutes.    Written patient instructions provided.  Total time in face to face counseling 30 minutes.   Follow up w/ me in 1 month. PCP in Jan.  Herlene Fleeta Morris, PharmD, JAQUELINE, CPP Clinical  Pharmacist [facility] [phone]

## 2024-01-19 ENCOUNTER — Other Ambulatory Visit: Payer: Self-pay

## 2024-01-19 MED ORDER — ONDANSETRON 8 MG PO TBDP
8.0000 mg | ORAL_TABLET | Freq: Three times a day (TID) | ORAL | 0 refills | Status: AC | PRN
  Filled 2024-01-19: qty 20, 7d supply, fill #0

## 2024-02-08 ENCOUNTER — Other Ambulatory Visit (HOSPITAL_COMMUNITY): Payer: Self-pay

## 2024-02-14 ENCOUNTER — Telehealth: Payer: Self-pay | Admitting: Nurse Practitioner

## 2024-02-14 NOTE — Telephone Encounter (Signed)
 Lvm to confirm appt for 12/1

## 2024-02-19 ENCOUNTER — Ambulatory Visit: Attending: Family Medicine | Admitting: Pharmacist

## 2024-02-19 ENCOUNTER — Encounter: Payer: Self-pay | Admitting: Pharmacist

## 2024-02-19 ENCOUNTER — Other Ambulatory Visit: Payer: Self-pay

## 2024-02-19 DIAGNOSIS — E119 Type 2 diabetes mellitus without complications: Secondary | ICD-10-CM

## 2024-02-19 DIAGNOSIS — E1165 Type 2 diabetes mellitus with hyperglycemia: Secondary | ICD-10-CM

## 2024-02-19 DIAGNOSIS — Z7985 Long-term (current) use of injectable non-insulin antidiabetic drugs: Secondary | ICD-10-CM

## 2024-02-19 DIAGNOSIS — Z794 Long term (current) use of insulin: Secondary | ICD-10-CM

## 2024-02-19 DIAGNOSIS — I10 Essential (primary) hypertension: Secondary | ICD-10-CM | POA: Diagnosis not present

## 2024-02-19 DIAGNOSIS — E785 Hyperlipidemia, unspecified: Secondary | ICD-10-CM | POA: Diagnosis not present

## 2024-02-19 DIAGNOSIS — Z7984 Long term (current) use of oral hypoglycemic drugs: Secondary | ICD-10-CM

## 2024-02-19 LAB — POCT GLYCOSYLATED HEMOGLOBIN (HGB A1C): HbA1c, POC (controlled diabetic range): 6.7 % (ref 0.0–7.0)

## 2024-02-19 MED ORDER — METFORMIN HCL 500 MG PO TABS
500.0000 mg | ORAL_TABLET | Freq: Two times a day (BID) | ORAL | 3 refills | Status: AC
  Filled 2024-02-19: qty 180, 90d supply, fill #0

## 2024-02-19 MED ORDER — LANTUS SOLOSTAR 100 UNIT/ML ~~LOC~~ SOPN
15.0000 [IU] | PEN_INJECTOR | Freq: Every day | SUBCUTANEOUS | 1 refills | Status: AC
  Filled 2024-02-19: qty 15, 100d supply, fill #0

## 2024-02-19 NOTE — Progress Notes (Signed)
 S:    PCP: [name]  No chief complaint on file.   [employer] interpreters: Zineb, [id number]  Patient arrives in good spirits.  Presents for diabetes management at the request of [name]. Patient was seen and referred on 11/28/2023.   I last saw him on 01/15/24. I continued his DM medication regimen without changes. I've seen him many times over my time here at [facility]. Traditionally, he has struggled with adherence due to a language barrier. Since I've worked with him and coordinated fills with our pharmacy, he has been doing better.   He endorsed medication adherence at that appt with me on 01/15/2024. A1c was 6.9% in September of this year and is 6.7% today! Of note, he is in need of refills. Misplaced his medications during a move about 5 days ago. Reports symptoms of hypoglycemia at work during the day. Because of this, he admits to me today, he only takes Lantus  on the weekends. He DOES NOT take during the week for fear of becoming hypoglycemic at work. When he does inject, he uses 26 units daily.   Family/Social History:  - FHx: DM (mother, father) - Tobacco: never smoker - Alcohol: reports occasional use   Insurance coverage/medication affordability: [employer]  Current diabetes medications include:  - Lantus  26 daily (Last filled for a 30-day on 01/15/24) - Metformin  1000 mg BID (last filled 11/13/23 for a 90-ds) - Trulicity  3 mg weekly (last filled 01/15/24 for a 28 day)  Current HTN medications include:  - Amlodipine  10 mg daily (last dispensed 11/13/23 for a 90-day) - Carvedilol  6.25 mg BID (last dispensed 11/13/23 for a 90-day) - Valsartan  160 mg daily (last dispensed 11/13/23 for a 90-day)  Current HLD medications include:  - Rosuvastatin  20 mg daily (last dispensed 12/15/2023 for a 90-day)  Patient denies symptoms of hypoglycemia.   Patient reported dietary habits:  - He does not limit carbohydrates  Patient-reported exercise habits:  - Does not exercise outside of work    Patient denies polyuria. Denies polydipsia.  Patient denies neuropathy. Patient denies visual changes. Patient reports self foot exams.   O:  No CGM or GM present at today's visit. Reports readings at home are in the 140s - 170s  Lab Results  Component Value Date   HGBA1C 6.7 02/19/2024   There were no vitals filed for this visit.   Lipid Panel     Component Value Date/Time   CHOL 212 (H) 11/28/2023 1028   TRIG 419 (H) 11/28/2023 1028   HDL 30 (L) 11/28/2023 1028   CHOLHDL 7.1 (H) 11/28/2023 1028   CHOLHDL 4.8 09/12/2016 0806   VLDL 29 09/12/2016 0806   LDLCALC 110 (H) 11/28/2023 1028   Clinical ASCVD: Yes - CAD, NSTEMI/CABG The ASCVD Risk score (Arnett DK, et al., 2019) failed to calculate for the following reasons:   Risk score cannot be calculated because patient has a medical history suggesting prior/existing ASCVD   A/P: Diabetes longstanding currently at goal with A1c of 6.7% today. Commended him for this! He only uses insulin  consistently 2 days weekly at 26 units a day. Despite this, A1c has remained at goal. I will have him restart Lantus  at 15 units daily and encourage him to take every day. We will also reduce metformin  to 500 mg BID with his eGFR of 47 in October. Collaborated with pharmacy today. We are refilling ALL medications that are due. His medication adherence is better overall. He is asymptomatic from a hyperglycemia standpoint today.  -START  taking Lantus  15 units once daily.  -Continue Trulicity  3 mg weekly.  -DECREASE metformin  to 500 mg BID.  -Extensively discussed pathophysiology of DM, recommended lifestyle interventions, dietary effects on glycemic control -Counseled on s/sx of and management of hypoglycemia -Next A1C anticipated 05/2024  Results reviewed and written information provided.    Written patient instructions provided. Patient verbalized understanding of treatment plan.  Total time in face to face counseling 30 minutes.    Written  patient instructions provided.  Total time in face to face counseling 30 minutes.   Follow up w/ me in 1 month. Dr. Brien 02/22/24 for URI symptoms.   Herlene Fleeta Morris, PharmD, JAQUELINE, CPP Clinical Pharmacist [facility] [phone]

## 2024-02-20 NOTE — Progress Notes (Unsigned)
 Established Patient Office Visit  Subjective   Patient ID: Carl Clark, male    DOB: 1950-12-01  Age: 73 y.o. MRN: [id number]  No chief complaint on file.   73 y.o. M with HTN DM HLD Pcp [facility]  Last seen pcp 11/2023: Primary hypertension -     carvedilol  (COREG ) 6.25 MG tablet; Take 1 tablet (6.25 mg total) by mouth 2 (two) times daily with a meal. FOR BLOOD PRESSURE Hypertension management is suboptimal due to non-adherence and medication confusion. - Instruct him to bring all medications to appointments for verification. - Send prescriptions to the downstairs pharmacy as requested.   Need for influenza vaccination -     Flu vaccine HIGH DOSE PF(Fluzone Trivalent)   Post herpetic neuralgia -     gabapentin  (NEURONTIN ) 300 MG capsule; Take 3 capsules (900 mg total) by mouth 3 (three) times daily. FOR NERVE PAIN   Atherosclerosis of native coronary artery of native heart with angina pectoris (HCC) -     carvedilol  (COREG ) 6.25 MG tablet; Take 1 tablet (6.25 mg total) by mouth 2 (two) times daily with a meal. FOR BLOOD PRESSURE -     Lipid panel   Persistent cough for 3 weeks or longer -     pantoprazole  (PROTONIX ) 40 MG tablet; Take 1 tablet (40 mg total) by mouth daily. FOR ACID REFLUX -     CBC with Differential -     QuantiFERON-TB Gold Plus -     benzonatate  (TESSALON ) 200 MG capsule; Take 1 capsule (200 mg total) by mouth 2 (two) times daily as needed for cough. Chronic cough Chronic cough likely due to untreated allergic rhinitis and GERD. No lung issues on x-ray. - Prescribe cough medication for symptomatic relief, especially at night. - Check white blood cell count; prescribe antibiotics if elevated.     Type 2 diabetes mellitus with insulin  therapy (HCC) -     insulin  glargine (LANTUS  SOLOSTAR) 100 UNIT/ML Solostar Pen; Inject 50 Units into the skin daily. -     Dulaglutide  (TRULICITY ) 3 MG/0.5ML SOAJ; Inject 3 mg as directed once a week. Diabetes  management requires adjustment due to insurance changes. Lantus  will replace Basaglar . - Send prescription for Lantus  to the pharmacy. - Ensure he has enough refills for diabetes medications.     Allergic conjunctivitis of both eyes -     Olopatadine  HCl (PATADAY ) 0.2 % SOLN; Apply 1 drop to both eyes daily.   Frequency of urination -     PSA   Environmental and seasonal allergies -     fluticasone  (FLONASE ) 50 MCG/ACT nasal spray; Place 2 sprays into both nostrils daily. FOR ALLERGIES -     levocetirizine (XYZAL ) 5 MG tablet; Take 1 tablet (5 mg total) by mouth every evening. FOR ALLERGIES Allergic rhinitis likely contributes to chronic cough due to non-adherence to medication. - Reinforce the importance of regular use of allergy medications, including nasal spray.     Gastroesophageal reflux disease (GERD) GERD may contribute to chronic cough and sore throat due to non-adherence to medication. - Reinforce the importance of regular use of acid reflux medication.    Herlene clin pharm 02/19/24 1c was 6.9% in September of this year and is 6.7% today! Of note, he is in need of refills. Misplaced his medications during a move about 5 days ago. Reports symptoms of hypoglycemia at work during the day. Because of this, he admits to me today, he only takes Lantus  on the weekends. He  DOES NOT take during the week for fear of becoming hypoglycemic at work. When he does inject, he uses 26 units daily.  START taking Lantus  15 units once daily.  -Continue Trulicity  3 mg weekly.  -DECREASE metformin  to 500 mg BID.   Today URI    {History (Optional):23778}  ROS    Objective:     There were no vitals taken for this visit. {Vitals History (Optional):23777}  Physical Exam   No results found for any visits on 02/22/24.  {Labs (Optional):23779}  The ASCVD Risk score (Arnett DK, et al., 2019) failed to calculate for the following reasons:   Risk score cannot be calculated because patient  has a medical history suggesting prior/existing ASCVD    Assessment & Plan:   Problem List Items Addressed This Visit   None   No follow-ups on file.    Belvie Silvan, MD

## 2024-02-22 ENCOUNTER — Other Ambulatory Visit: Payer: Self-pay

## 2024-02-22 ENCOUNTER — Ambulatory Visit: Attending: Critical Care Medicine | Admitting: Critical Care Medicine

## 2024-02-22 ENCOUNTER — Encounter: Payer: Self-pay | Admitting: Critical Care Medicine

## 2024-02-22 VITALS — BP 130/70 | HR 60 | Temp 97.7°F | Resp 19 | Ht 65.0 in | Wt 151.8 lb

## 2024-02-22 DIAGNOSIS — I10 Essential (primary) hypertension: Secondary | ICD-10-CM | POA: Diagnosis not present

## 2024-02-22 DIAGNOSIS — J3089 Other allergic rhinitis: Secondary | ICD-10-CM | POA: Diagnosis not present

## 2024-02-22 DIAGNOSIS — R053 Chronic cough: Secondary | ICD-10-CM | POA: Insufficient documentation

## 2024-02-22 DIAGNOSIS — J309 Allergic rhinitis, unspecified: Secondary | ICD-10-CM | POA: Insufficient documentation

## 2024-02-22 DIAGNOSIS — K219 Gastro-esophageal reflux disease without esophagitis: Secondary | ICD-10-CM | POA: Insufficient documentation

## 2024-02-22 DIAGNOSIS — E1165 Type 2 diabetes mellitus with hyperglycemia: Secondary | ICD-10-CM

## 2024-02-22 MED ORDER — AZELASTINE HCL 0.1 % NA SOLN
2.0000 | Freq: Two times a day (BID) | NASAL | 12 refills | Status: AC
  Filled 2024-02-22: qty 30, 50d supply, fill #0

## 2024-02-22 MED ORDER — AZITHROMYCIN 250 MG PO TABS
ORAL_TABLET | ORAL | 0 refills | Status: AC
  Filled 2024-02-22: qty 6, 5d supply, fill #0

## 2024-02-22 MED ORDER — BENZONATATE 100 MG PO CAPS
100.0000 mg | ORAL_CAPSULE | Freq: Two times a day (BID) | ORAL | 2 refills | Status: AC | PRN
  Filled 2024-02-22: qty 60, 30d supply, fill #0

## 2024-02-22 MED ORDER — PANTOPRAZOLE SODIUM 40 MG PO TBEC
40.0000 mg | DELAYED_RELEASE_TABLET | Freq: Two times a day (BID) | ORAL | 3 refills | Status: AC
  Filled 2024-02-22: qty 180, 90d supply, fill #0

## 2024-02-22 NOTE — Patient Instructions (Addendum)
 Follow reflux diet as noted in the attachment look at the ingredients and try to apply it to your aerobic diet that you use  Take benzonatate  3 times a day as needed for cough  Add Astelin nasal spray 2 sprays each nostril twice a day to your Flonase  and stay on the Flonase  the Astelin will help with drainage from the nose  Take azithromycin  2 the first day then 1 a day thereafter till the package is empty this will help with sinus infection and throat infection  Blood pressure and diabetes is under excellent control  No other changes in your medication   If your symptoms persist we may consider sending you to a throat specialist to have your upper throat and lower throat areas examined  Return for your next primary care clinic visit as scheduled   Follow reflux diet as noted in the attachment look at the ingredients and try to apply it to your aerobic diet that you use  Take benzonatate  3 times a day as needed for cough  Add Astelin nasal spray 2 sprays each nostril twice a day to your Flonase  and stay on the Flonase  the Astelin will help with drainage from the nose  Take azithromycin  2 the first day then 1 a day thereafter till the package is empty this will help with sinus infection and throat infection  Blood pressure and diabetes is under excellent control  No other changes in your medication   If your symptoms persist we may consider sending you to a throat specialist to have your upper throat and lower throat areas examined  Return for your next primary care clinic visit as scheduled  ???? ?????? ??????? ????? ???????? ??? ?? ???? ?? ??????? ????? ???????? ????? ??????? ??? ????? ??????? ???????.  ????? ????????? 3 ???? ?????? ??? ?????? ??????.  ??? ???? ??????? ??????? ?????? ?? ?? ???? ??? ????? ??????? ??? ??????? ?????? ??? ??????. ?????? ??????? ?? ????? ?????? ?? ?????.  ????? ????? ?? ??????????? ?? ????? ?????? ?? ??? ????? ?????? ??? ????? ??????. ?????? ???  ?? ???? ?????? ?????? ??????? ??????? ?????.  ??? ???? ???? ?????? ??? ??????? ??????.  ?? ??????? ???? ?? ?????.  ??? ?????? ???????? ??? ????? ?????? ?????? ??? ???? ?????? ????? ?????? ???????.  ?? ??? ????? ??????? ?????? ??????? ?? ?????? ??????. aitabie nzaman ghdhayyan lieilaj aliartijae kama hu muadih fi almulhaqa, warajae almukawinat wahawal 'iidafataha 'iilaa nizamik alghidhayiyi alhawayiy. tanawal binzunatat 3 maraat ywmyan hasab alhajat lilsieali. 'udif bikhakh 'astilin al'anfi, bkhkhtyn fi kuli fathat 'anf maratayn ywmyan, 'iilaa flunaz, waistamara ealaa flunaz. sayusaeid 'astilin fi tasrif almukhat min al'anfu. tanawal habatayn min 'azithrumisin fi alyawm al'awwla, thuma bakhatan wahidatan ywmyan hataa tantahi aleubuatu. sayusaeid dhalik fi eilaj altihab aljuyub al'anfiat wailtihab alhalqi. daghat aldam wada' alsukari taht alsaytarat altaamati. la taghyirat 'ukhraa fi dawayika. 'iidha astamarat al'aeradi, faqad nansahuk biziarat 'akhisaayiyin haliq lifahs mintaqatay alhalq asberry fortes. eud 'iilaa eiadat alrieayat alsihiyat al'awaliat fi maweidiha alm

## 2024-02-22 NOTE — Assessment & Plan Note (Signed)
 Markedly improved no changes as per clinical pharmacist changes have already been made

## 2024-02-22 NOTE — Assessment & Plan Note (Signed)
 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

## 2024-02-22 NOTE — Assessment & Plan Note (Signed)
 Reflux disease contributing to cough will increase pantoprazole  to 40 mg twice daily

## 2024-02-22 NOTE — Assessment & Plan Note (Signed)
 Hypertension currently controlled no change in medicine

## 2024-02-27 ENCOUNTER — Ambulatory Visit: Attending: Family Medicine

## 2024-02-27 ENCOUNTER — Ambulatory Visit: Attending: Cardiovascular Disease | Admitting: Cardiovascular Disease

## 2024-02-28 LAB — LIPID PANEL
Chol/HDL Ratio: 4.4 ratio (ref 0.0–5.0)
Cholesterol, Total: 131 mg/dL (ref 100–199)
HDL: 30 mg/dL — ABNORMAL LOW (ref >39)
LDL Chol Calc (NIH): 75 mg/dL (ref 0–99)
Triglycerides: 149 mg/dL (ref 0–149)
VLDL Cholesterol Cal: 26 mg/dL (ref 5–40)

## 2024-03-09 ENCOUNTER — Ambulatory Visit: Payer: Self-pay | Admitting: Nurse Practitioner

## 2024-03-29 ENCOUNTER — Ambulatory Visit: Admitting: Nurse Practitioner

## 2024-04-01 ENCOUNTER — Ambulatory Visit: Attending: Nurse Practitioner | Admitting: Nurse Practitioner

## 2024-04-01 ENCOUNTER — Encounter: Payer: Self-pay | Admitting: Nurse Practitioner

## 2024-04-01 ENCOUNTER — Other Ambulatory Visit: Payer: Self-pay

## 2024-04-01 VITALS — BP 107/61 | HR 59 | Ht 65.0 in | Wt 144.0 lb

## 2024-04-01 DIAGNOSIS — M25561 Pain in right knee: Secondary | ICD-10-CM | POA: Diagnosis not present

## 2024-04-01 DIAGNOSIS — E119 Type 2 diabetes mellitus without complications: Secondary | ICD-10-CM | POA: Diagnosis not present

## 2024-04-01 DIAGNOSIS — I10 Essential (primary) hypertension: Secondary | ICD-10-CM | POA: Diagnosis not present

## 2024-04-01 DIAGNOSIS — Z794 Long term (current) use of insulin: Secondary | ICD-10-CM

## 2024-04-01 DIAGNOSIS — M25562 Pain in left knee: Secondary | ICD-10-CM | POA: Diagnosis not present

## 2024-04-01 DIAGNOSIS — G8929 Other chronic pain: Secondary | ICD-10-CM

## 2024-04-01 MED ORDER — DICLOFENAC SODIUM 75 MG PO TBEC
75.0000 mg | DELAYED_RELEASE_TABLET | Freq: Two times a day (BID) | ORAL | 2 refills | Status: AC
  Filled 2024-04-01: qty 60, 30d supply, fill #0

## 2024-04-01 MED ORDER — TRULICITY 3 MG/0.5ML ~~LOC~~ SOAJ
3.0000 mg | SUBCUTANEOUS | 6 refills | Status: AC
  Filled 2024-04-01: qty 2, 28d supply, fill #0

## 2024-04-01 MED ORDER — AMLODIPINE BESYLATE 10 MG PO TABS
10.0000 mg | ORAL_TABLET | Freq: Every day | ORAL | 1 refills | Status: AC
  Filled 2024-04-01: qty 90, 90d supply, fill #0

## 2024-04-01 NOTE — Progress Notes (Signed)
 "  Assessment & Plan:  Carl Clark was seen today for hypertension.  Diagnoses and all orders for this visit:  Primary hypertension  Chronic pain of both knees -     diclofenac  (VOLTAREN ) 75 MG EC tablet; Take 1 tablet (75 mg total) by mouth 2 (two) times daily. FOR KNEE PAIN  Type 2 diabetes mellitus with insulin  therapy (HCC) -     Dulaglutide  (TRULICITY ) 3 MG/0.5ML SOAJ; Inject 3 mg as directed once a week.    Patient has been counseled on age-appropriate routine health concerns for screening and prevention. These are reviewed and up-to-date. Referrals have been placed accordingly. Immunizations are up-to-date or declined.    Subjective:   Chief Complaint  Patient presents with   Hypertension    Carl Clark 74 y.o. male presents to office today for follow up to HTN  VRI was used to communicate directly with patient for the entire encounter including providing detailed patient instructions.    He has a past medical history of CAD (03/16/2017), History of non-ST elevation myocardial infarction (09/12/2016), History of rectal bleeding, Hypertension, Mild carotid artery disease, and Type II diabetes mellitus.   HTN Blood pressure is well controlled.  BP Readings from Last 3 Encounters:  04/01/24 107/61  02/22/24 130/70  01/15/24 109/67    He has chronic bilateral knee pain. Has been seen by ortho several years ago and received cortisone injection in the right knee. Requesting refill of diclofenac  tablets today.  Experiences swelling in his bilateral  ankles when standing for prolonged periods of time.    Review of Systems  Constitutional:  Negative for fever, malaise/fatigue and weight loss.  HENT: Negative.  Negative for nosebleeds.   Eyes: Negative.  Negative for blurred vision, double vision and photophobia.  Respiratory: Negative.  Negative for cough and shortness of breath.   Cardiovascular:  Positive for leg swelling. Negative for chest pain and  palpitations.  Gastrointestinal: Negative.  Negative for heartburn, nausea and vomiting.  Musculoskeletal:  Positive for joint pain. Negative for myalgias.  Neurological: Negative.  Negative for dizziness, focal weakness, seizures and headaches.  Psychiatric/Behavioral: Negative.  Negative for suicidal ideas.     Past Medical History:  Diagnosis Date   CAD (coronary artery disease) 03/16/2017   S/p NSTEMI 6/18 >> LHC: multivessel CAD >> s/p CABG // Echo 6/18: Mild LVH, EF 60-65, normal wall motion, grade 1 diastolic dysfunction, normal RV SF, PASP 36 (borderline pulmonary hypertension) // Pre-CABG Dopplers 09/13/16: Bilateral ICA 1-39   Echocardiogram    Echo 09/2018: EF 60-65, normal wall motion, RVSP 24.9   Family history of anesthesia complication    never had anesthesia (07/14/2012)   History of non-ST elevation myocardial infarction (NSTEMI) 09/12/2016   Treated with CABG   History of rectal bleeding    secondary to diverticulum affecting right hepatic flexure - 06/2012   Hypertension    Mild carotid artery disease    a. 1-39% bilaterally preCABG in 2018.   Type II diabetes mellitus [facility])     Past Surgical History:  Procedure Laterality Date   COLONOSCOPY Left 07/14/2012   Procedure: COLONOSCOPY;  Surgeon: Elsie Cree, MD;  Location: [facility] ENDOSCOPY;  Service: Endoscopy;  Laterality: Left;   CORONARY ARTERY BYPASS GRAFT N/A 09/15/2016   Procedure: CORONARY ARTERY BYPASS GRAFTING (CABG) x 3, LIMA to LAD, SVG to DIAGONAL, SVG to OM2, USING LEFT MAMMARY ARTERY AND RIGHT GREATER SAPHENOUS VEIN HARVESTED ENDOSCOPICALLY;  Surgeon: Army Dallas NOVAK, MD;  Location: [facility] OR;  Service:  Open Heart Surgery;  Laterality: N/A;   ESOPHAGOGASTRODUODENOSCOPY N/A 07/13/2012   Procedure: ESOPHAGOGASTRODUODENOSCOPY (EGD);  Surgeon: Elsie Cree, MD;  Location[facility] ENDOSCOPY;  Service: Endoscopy;  Laterality: N/A;  pt in ED A8   LEFT HEART CATH AND CORONARY ANGIOGRAPHY N/A 09/12/2016   Procedure: Left  Heart Cath and Coronary Angiography;  Surgeon: Wonda Sharper, MD;  Location: [facility] INVASIVE CV LAB;  Service: Cardiovascular;  Laterality: N/A;   NO PAST SURGERIES     TEE WITHOUT CARDIOVERSION N/A 09/15/2016   Procedure: TRANSESOPHAGEAL ECHOCARDIOGRAM (TEE);  Surgeon: Army Dallas NOVAK, MD;  Location: [facility] OR;  Service: Open Heart Surgery;  Laterality: N/A;    Family History  Problem Relation Age of Onset   Diabetes Mother    Diabetes Father     Social History Reviewed with no changes to be made today.   Outpatient Medications Prior to Visit  Medication Sig Dispense Refill   Accu-Chek Softclix Lancets lancets Use to check blood sugar 2 times daily. E11.9 100 each 6   amLODipine  (NORVASC ) 10 MG tablet Take 1 tablet (10 mg total) by mouth daily. FOR BLOOD PRESSURE 90 tablet 1   aspirin  EC 81 MG tablet Take 1 tablet (81 mg total) by mouth daily. Swallow whole. 90 tablet 3   azelastine  (ASTELIN ) 0.1 % nasal spray Place 2 sprays into both nostrils 2 (two) times daily. Use in each nostril as directed 30 mL 12   benzonatate  (TESSALON ) 100 MG capsule Take 1 capsule (100 mg total) by mouth 2 (two) times daily as needed for cough. 60 capsule 2   Blood Glucose Monitoring Suppl (ACCU-CHEK GUIDE ME) w/Device KIT Use to check blood sugar 2 times daily. 1 kit 0   carvedilol  (COREG ) 6.25 MG tablet Take 1 tablet (6.25 mg total) by mouth 2 (two) times daily with a meal. FOR BLOOD PRESSURE 180 tablet 1   Ferrous Sulfate  (IRON ) 325 (65 Fe) MG TABS Take 1 tablet (325 mg total) by mouth 2 (two) times daily. Take with OJ or vitamin C 60 tablet 1   fluticasone  (FLONASE ) 50 MCG/ACT nasal spray Place 2 sprays into both nostrils daily. FOR ALLERGIES 16 g 6   gabapentin  (NEURONTIN ) 300 MG capsule Take 3 capsules (900 mg total) by mouth 3 (three) times daily. FOR NERVE PAIN 270 capsule 2   glucose blood (ACCU-CHEK GUIDE TEST) test strip Use to check blood sugar 2 times daily. E11.9 100 each 6   insulin  glargine (LANTUS   SOLOSTAR) 100 UNIT/ML Solostar Pen Inject 15 Units into the skin daily. 15 mL 1   levocetirizine (XYZAL ) 5 MG tablet Take 1 tablet (5 mg total) by mouth every evening. FOR ALLERGIES 90 tablet 3   metFORMIN  (GLUCOPHAGE ) 500 MG tablet Take 1 tablet (500 mg total) by mouth 2 (two) times daily with a meal. 180 tablet 3   Misc. Devices MISC Please provide patient with an insurance approved QUAD CANE 1 each 0   Olopatadine  HCl (PATADAY ) 0.2 % SOLN Apply 1 drop to both eyes daily. 2.5 mL 6   ondansetron  (ZOFRAN -ODT) 8 MG disintegrating tablet Dissolve 1 tablet (8 mg total) by mouth every 8 (eight) hours as needed for nausea or vomiting. 20 tablet 0   pantoprazole  (PROTONIX ) 40 MG tablet Take 1 tablet (40 mg total) by mouth 2 (two) times daily before a meal. FOR ACID REFLUX 180 tablet 3   rosuvastatin  (CRESTOR ) 20 MG tablet Take 1 tablet (20 mg total) by mouth daily. STOP atorvastatin . FOR CHOLESTEROL 90 tablet  3   traZODone  (DESYREL ) 50 MG tablet Take 1-2 tablets (50-100 mg total) by mouth at bedtime. For SLEEP 180 tablet 1   triamcinolone  (KENALOG ) 0.025 % ointment Apply 1 Application topically 2 (two) times daily onto the back of the head 454 g 1   valsartan  (DIOVAN ) 160 MG tablet Take 1 tablet (160 mg total) by mouth daily. For blood pressure 90 tablet 2   diclofenac  (VOLTAREN ) 75 MG EC tablet Take 1 tablet (75 mg total) by mouth 2 (two) times daily. 60 tablet 1   Dulaglutide  (TRULICITY ) 3 MG/0.5ML SOAJ Inject 3 mg as directed once a week. 2 mL 6   nitroGLYCERIN  (NITROSTAT ) 0.4 MG SL tablet Dissolve 1 tablet under the tongue every 5 minutes as needed for chest pain. Max of 3 doses, then 911. (Patient not taking: Reported on 04/01/2024) 25 tablet 6   Facility-Administered Medications Prior to Visit  Medication Dose Route Frequency Provider Last Rate Last Admin   sodium chloride  0.9 % bolus 1,000 mL  1,000 mL Intravenous Once [place] Slough M, PA-C   Stopped at 11/07/22 1730    Allergies[1]      Objective:    BP 107/61 (BP Location: Left Arm, Patient Position: Sitting, Cuff Size: Normal)   Pulse (!) 59   Ht 5' 5 (1.651 m)   Wt 144 lb (65.3 kg)   SpO2 100%   BMI 23.96 kg/m  Wt Readings from Last 3 Encounters:  04/01/24 144 lb (65.3 kg)  02/22/24 151 lb 12.8 oz (68.9 kg)  11/28/23 150 lb 12.8 oz (68.4 kg)    Physical Exam Vitals and nursing note reviewed.  Constitutional:      Appearance: He is well-developed.  HENT:     Head: Normocephalic and atraumatic.  Cardiovascular:     Rate and Rhythm: Normal rate and regular rhythm.     Heart sounds: Normal heart sounds. No murmur heard.    No friction rub. No gallop.  Pulmonary:     Effort: Pulmonary effort is normal. No tachypnea or respiratory distress.     Breath sounds: Normal breath sounds. No decreased breath sounds, wheezing, rhonchi or rales.  Chest:     Chest wall: No tenderness.  Abdominal:     General: Bowel sounds are normal.     Palpations: Abdomen is soft.  Musculoskeletal:        General: Normal range of motion.     Cervical back: Normal range of motion.     Right knee: Swelling present.     Left knee: Swelling present.  Skin:    General: Skin is warm and dry.  Neurological:     Mental Status: He is alert and oriented to person, place, and time.     Coordination: Coordination normal.  Psychiatric:        Behavior: Behavior normal. Behavior is cooperative.        Thought Content: Thought content normal.        Judgment: Judgment normal.          Patient has been counseled extensively about nutrition and exercise as well as the importance of adherence with medications and regular follow-up. The patient was given clear instructions to go to ER or return to medical center if symptoms don't improve, worsen or new problems develop. The patient verbalized understanding.   Follow-up: Return in about 3 months (around 06/30/2024).   Haze LELON Servant, FNP-BC [facility]  [place], [place] [phone]   04/01/2024, 9:52 AM    [  1]  Allergies Allergen Reactions   Victoza  [Liraglutide ] Swelling    Lip swelling, itching, rash   "

## 2024-04-01 NOTE — Patient Instructions (Addendum)
 [facility] on [address] for Compression [employer] [facility]  .

## 2024-04-03 ENCOUNTER — Telehealth: Payer: Self-pay | Admitting: Nurse Practitioner

## 2024-04-03 NOTE — Telephone Encounter (Signed)
 Copied from CRM [id number]. Topic: General - Other >> Apr 03, 2024  2:14 PM   Viola FALCON wrote:  April from [employer] called regarding [employer] needing documentation stating that patient has active Chronic condition in order to release benefits. Their number is [phone]  [email or web address]

## 2024-04-03 NOTE — Telephone Encounter (Signed)
 I Spoke with a rep name Ele at [facility] reference # [id number]. There was no record of the call and she could not help me because of this.

## 2024-04-10 NOTE — Progress Notes (Unsigned)
" ° °  S:    PCP: [name]  No chief complaint on file.   [employer] interpreters: ***, #***  Patient arrives in good spirits.  Presents for diabetes management at the request of [name]. Patient was referred on 11/28/2023.   Seen on 01/15/24. I continued his DM medication regimen without changes. I've seen him many times over my time here at [facility]. Traditionally, he has struggled with adherence due to a language barrier. Since I've worked with him and coordinated fills with our pharmacy, he has been doing better.   Last seen on 02/19/24, where he's A1c was 6.7%, which was slightly decreased from 6.9% in 11/2023. He reported symptoms of hypoglycemia while he was at work, and he states that he only takes Lantus  (26 units) on the weekends due to fear of hypoglycemia.   At today's visit, ***  Symptoms of hypoglycemia? Made medication changes? Lantus  to 15 units daily and metformin  to 500 mg twice daily? Potential to further reduce Lantus , if continued hypoglycemia Fasting blood sugars: ***  Family/Social History:  FHx: DM (mother, father) Tobacco: never smoker Alcohol: reports occasional use   Insurance coverage/medication affordability: [employer]  Current diabetes medications include:  Lantus  15 units daily  (Last filled for a 100-day on 02/19/24) Metformin  500 mg BID  (last filled 02/19/24 for a 90-ds) Trulicity  3 mg weekly  (last filled 04/01/24 for a 28 day)  Current HTN medications include:  Amlodipine  10 mg daily  (last dispensed 02/19/24 for a 90-day) Carvedilol  6.25 mg BID  (last dispensed 02/19/24 for a 90-day) Valsartan  160 mg daily  (last dispensed 02/19/24 for a 90-day)  Current HLD medications include:  Rosuvastatin  20 mg daily  (last dispensed 12/15/2023 for a 90-day)  Patient denies symptoms of hypoglycemia.   Patient reported dietary habits:  He does not limit carbohydrates  Patient-reported exercise habits:  Does not exercise outside of work   Patient denies  polyuria. Denies polydipsia.  Patient denies neuropathy. Patient denies visual changes. Patient reports self foot exams.   O:  No CGM or GM present at today's visit. Reports readings at home are in the ***  Lab Results  Component Value Date   HGBA1C 6.7 02/19/2024   There were no vitals filed for this visit.  Lipid Panel     Component Value Date/Time   CHOL 131 02/27/2024 1028   TRIG 149 02/27/2024 1028   HDL 30 (L) 02/27/2024 1028   CHOLHDL 4.4 02/27/2024 1028   CHOLHDL 4.8 09/12/2016 0806   VLDL 29 09/12/2016 0806   LDLCALC 75 02/27/2024 1028   Clinical ASCVD: Yes - CAD, NSTEMI/CABG  A/P: Diabetes longstanding currently at goal with A1c of 6.7% on 02/27/24, which is stable from 10/2023. *** He is asymptomatic from a hyperglycemia standpoint today.  *** Extensively discussed pathophysiology of DM, recommended lifestyle interventions, dietary effects on glycemic control Counseled on s/sx of and management of hypoglycemia Next A1C anticipated 05/2024  Results reviewed and written information provided.    Written patient instructions provided. Patient verbalized understanding of treatment plan. Total time in face to face counseling 30 minutes.    Follow up: PharmD: *** PCP: 07/02/2024     *** Herlene Fleeta Morris, PharmD, BCACP, CPP Clinical Pharmacist [facility] [phone]  "

## 2024-04-12 ENCOUNTER — Telehealth: Payer: Self-pay | Admitting: Nurse Practitioner

## 2024-04-12 NOTE — Telephone Encounter (Signed)
 Called patient to reschedule the appointment on 04/15/2024 with Herlene Mango due to [facility] , left detailed VM to call back.

## 2024-04-15 ENCOUNTER — Ambulatory Visit: Admitting: Pharmacist

## 2024-05-20 ENCOUNTER — Ambulatory Visit: Payer: Self-pay | Admitting: Nurse Practitioner

## 2024-07-02 ENCOUNTER — Ambulatory Visit: Payer: Self-pay | Admitting: Nurse Practitioner
